# Patient Record
Sex: Male | Born: 1945
Health system: Southern US, Community
[De-identification: ages and names within clinical notes are randomized; demographics above are authoritative.]

## PROBLEM LIST (undated history)

## (undated) DIAGNOSIS — K219 Gastro-esophageal reflux disease without esophagitis: Secondary | ICD-10-CM

## (undated) DIAGNOSIS — K2270 Barrett's esophagus without dysplasia: Secondary | ICD-10-CM

## (undated) DIAGNOSIS — E7800 Pure hypercholesterolemia, unspecified: Secondary | ICD-10-CM

## (undated) DIAGNOSIS — M1990 Unspecified osteoarthritis, unspecified site: Secondary | ICD-10-CM

## (undated) HISTORY — PX: HERNIA REPAIR: SHX51

## (undated) HISTORY — PX: OTHER SURGICAL HISTORY: SHX169

## (undated) HISTORY — DX: Gastro-esophageal reflux disease without esophagitis: K21.9

## (undated) HISTORY — PX: CHOLECYSTECTOMY: SHX55

## (undated) HISTORY — PX: TONSILLECTOMY: SUR1361

## (undated) HISTORY — DX: Barrett's esophagus without dysplasia: K22.70

---

## 1998-11-27 HISTORY — PX: BACK SURGERY: SHX140

## 1999-04-11 ENCOUNTER — Encounter: Payer: Self-pay | Admitting: Neurosurgery

## 1999-04-11 ENCOUNTER — Inpatient Hospital Stay (HOSPITAL_COMMUNITY): Admission: RE | Admit: 1999-04-11 | Discharge: 1999-04-14 | Payer: Self-pay | Admitting: Neurosurgery

## 1999-11-01 ENCOUNTER — Encounter: Admission: RE | Admit: 1999-11-01 | Discharge: 1999-11-01 | Payer: Self-pay | Admitting: Neurosurgery

## 1999-11-01 ENCOUNTER — Encounter: Payer: Self-pay | Admitting: Neurosurgery

## 2000-05-01 ENCOUNTER — Encounter: Admission: RE | Admit: 2000-05-01 | Discharge: 2000-05-01 | Payer: Self-pay | Admitting: Neurosurgery

## 2000-05-01 ENCOUNTER — Encounter: Payer: Self-pay | Admitting: Neurosurgery

## 2001-04-19 ENCOUNTER — Ambulatory Visit (HOSPITAL_COMMUNITY): Admission: RE | Admit: 2001-04-19 | Discharge: 2001-04-19 | Payer: Self-pay | Admitting: Internal Medicine

## 2004-06-18 ENCOUNTER — Emergency Department (HOSPITAL_COMMUNITY): Admission: EM | Admit: 2004-06-18 | Discharge: 2004-06-18 | Payer: Self-pay | Admitting: Emergency Medicine

## 2004-06-24 ENCOUNTER — Inpatient Hospital Stay (HOSPITAL_COMMUNITY): Admission: AD | Admit: 2004-06-24 | Discharge: 2004-06-25 | Payer: Self-pay | Admitting: Internal Medicine

## 2006-03-28 ENCOUNTER — Encounter: Payer: Self-pay | Admitting: Neurosurgery

## 2008-07-07 ENCOUNTER — Ambulatory Visit (HOSPITAL_COMMUNITY): Admission: RE | Admit: 2008-07-07 | Discharge: 2008-07-07 | Payer: Self-pay | Admitting: Internal Medicine

## 2009-04-03 ENCOUNTER — Encounter: Admission: RE | Admit: 2009-04-03 | Discharge: 2009-04-03 | Payer: Self-pay | Admitting: Neurosurgery

## 2010-01-07 ENCOUNTER — Ambulatory Visit (HOSPITAL_COMMUNITY): Admission: RE | Admit: 2010-01-07 | Discharge: 2010-01-07 | Payer: Self-pay | Admitting: Internal Medicine

## 2011-04-14 NOTE — H&P (Signed)
NAME:  KNOWLEDGE, ESCANDON NO.:  [id number]   MEDICAL RECORD NO.:  [id number]                  PATIENT TYPE:   LOCATION:                                       FACILITY:   PHYSICIAN:  Kingsley Callander. Ouida Sills, M.D.                  DATE OF BIRTH:   DATE OF ADMISSION:  06/23/2004  DATE OF DISCHARGE:                                HISTORY & PHYSICAL   CHIEF COMPLAINT:  Upper abdominal pain.   HISTORY OF PRESENT ILLNESS:  This patient is a 65 year old white male who  was reevaluated today for upper abdominal pain.  He had been seen in the  emergency room last Saturday after he had experienced a 1-day bout of  vomiting, diarrhea and upper abdominal pain.  He had described epigastric  cramps.  He was evaluated in the office on 07/25 because of persistent  symptoms.  He had not experienced additional vomiting. He had been able to  eat Chicken Noodle soup.  He had been treated with Bentyl, clear liquids and  a bland diet.  He denied hematemesis, melena, or rectal bleeding.  His CBC  on July 23 revealed a white count of 9.3 with a hemoglobin of 14.5 and  platelets of 420.  His electrolytes had been normal, but his bilirubin had  been 1.7 with an SGOT of 21 and an SGPT of 26.  On evaluation in the office  today he was found to have increased tenderness in his epigastrium.  When he  attempted to eat he had had increased pain suggesting the possibility of  cholecystitis.  He did not experience radiation of pain to his back.  He has  not had prior abdominal surgeries.  He had undergone a colonoscopy in 2002.  He had had a hernia repair at 11.   PAST MEDICAL HISTORY:  1. Diabetes.  2. Hyperlipidemia.  3. Cornea transplants.  4. Hernia repair.  5. Tonsillectomy.  6. Microalbuminuria.  7. History of undescended right testis, previously evaluated by Dr.     Rito Ehrlich.  8. Arthroscopic left knee surgery.   MEDICATIONS:  1. Bentyl 10 mg t.i.d.  2. Glucophage 1000 mg b.i.d.  3. Amaryl 2 mg daily.  4. Lipitor 10 mg daily.  5. Aspirin 81 mg daily.  6. Mavik 1 mg daily.   ALLERGIES:  None.   SOCIAL HISTORY:  He does not use drugs or alcohol.  He is a former smoker.   FAMILY HISTORY:  His father died at 61 and had COPD and lung cancer.  His  mother died at 16 of a sudden death in her sleep.   REVIEW OF SYSTEMS:  Noncontributory.   PHYSICAL EXAMINATION:  VITAL SIGNS:  Weight 157, temperature 98, blood  pressure 104/76, pulse 72.  GENERAL:  Uncomfortable, possibly slightly jaundiced appearing white male.  HEENT:  Slight scleral icterus is present.  Pharynx is moist.  NECK:  Neck  is supple without adenopathy or thyromegaly.  LUNGS:  Clear.  HEART:  Regular with no murmurs.  ABDOMEN:  Tender in the upper abdomen, more centrally than right-sided.  No  palpable hepatosplenomegaly or mass.  EXTREMITIES:  No cyanosis, clubbing or edema.  NEUROLOGIC:  Grossly intact.   LABORATORY DATA:  Today's white count is 10.7, hemoglobin 15.4, platelets  458, 82 segs, 8 lymphs.  Chemistries are pending.  His ultrasound of the  gallbladder reveals a distended gallbladder with sludge. There is a small  amount of free fluid adjacent to the gallbladder fundus.  An acute abdominal  series is unremarkable.   IMPRESSION:  1. Cholecystitis.  Consult surgery.  2. Diabetes.  Hold Glucophage and Amaryl for now.  3. Hyperlipidemia.  4. Microalbuminuria, hold Mavik for now.     ___________________________________________                                         Kingsley Callander. Ouida Sills, M.D.   ROF/MEDQ  D:  06/23/2004  T:  06/23/2004  Job:  [id number]

## 2011-04-14 NOTE — Discharge Summary (Signed)
NAME:  Christian Woodard, Christian Woodard NO.:  [id number]   MEDICAL RECORD NO.:  [id number]                   PATIENT TYPE:  OBV   LOCATION:  A320                                 FACILITY:  [facility]   PHYSICIAN:  Dalia Heading, M.D.               DATE OF BIRTH:  Oct 30, 1946   DATE OF ADMISSION:  06/23/2004  DATE OF DISCHARGE:  06/25/2004                                 DISCHARGE SUMMARY   HOSPITAL COURSE:  The patient is a 65 year old white male who was admitted  by Dr. Ouida Sills for workup of right upper quadrant abdominal pain. He was noted  to have pericholecystic fluid with sludge. There was tenderness over the  gallbladder on ultrasound. His white blood cell count was noted to be  slightly elevated. He did have an elevated total bilirubin with indirect  elevated component. His direct bilirubin was within normal limits. Amylase  was within normal limits. Surgical consultation was obtained, and the  patient was taken the following day and underwent a laparoscopic  cholecystectomy. He was noted to have an inflamed gallbladder. He tolerated  the procedure well. His postoperative course has been unremarkable. His diet  was advanced without difficulty.   The patient is being discharged home on June 25, 2004 in good and improving  condition.   DISCHARGE INSTRUCTIONS:  The patient is to follow up with Dr. Franky Macho  on June 30, 2004.   DISCHARGE MEDICATIONS:  Vicodin one to two tablets p.o. q.4h. p.r.n. pain.  He is resume all of his medications as previously prescribed.   PRINCIPAL DIAGNOSES:  1. Cholecystitis, cholelithiasis.  2. Noninsulin-dependent diabetes mellitus.   PRINCIPAL PROCEDURE:  Laparoscopic cholecystectomy on June 24, 2004.     ___________________________________________                                         Dalia Heading, M.D.   MAJ/MEDQ  D:  06/25/2004  T:  06/25/2004  Job:  [id number]

## 2011-04-14 NOTE — Op Note (Signed)
NAME:  Christian Woodard, Christian Woodard NO.:  [id number]   MEDICAL RECORD NO.:  [id number]                   PATIENT TYPE:  OBV   LOCATION:  A320                                 FACILITY:  [facility]   PHYSICIAN:  Dalia Heading, M.D.               DATE OF BIRTH:  [birth year]   DATE OF PROCEDURE:  06/24/2004  DATE OF DISCHARGE:                                 OPERATIVE REPORT   PREOPERATIVE DIAGNOSIS:  Cholecystitis, cholelithiasis.   POSTOPERATIVE DIAGNOSIS:  Cholecystitis, cholelithiasis.   PROCEDURE:  Laparoscopic cholecystectomy.   SURGEON:  Dalia Heading, M.D.   ANESTHESIA:  General endotracheal.   INDICATIONS:  The patient is a 65 year old white male who presents with  cholecystitis secondary to cholelithiasis.  He also has a slightly elevated  white blood cell count and elevated total bilirubin, although this is all  indirect in nature.  Patient now comes to the operating room for  laparoscopic cholecystectomy with possible cholangiogram.  The risks and  benefits of the procedure, including bleeding, infection, hepatobiliary  injury, and the possibility of an open procedure were fully explained to the  patient, who gave informed consent.   PROCEDURE NOTE:  The patient was placed in the supine position.  After the  induction of general endotracheal anesthesia, the abdomen was prepped and  draped using the usual sterile technique with Betadine.  Surgical site  confirmation was performed.   A supraumbilical incision was made down to the fascia.  A Varies needle was  introduced into the abdominal cavity, and confirmation of placement was done  using the saline drop test.  The abdomen was then insufflated with 16 mmHg.  An 11 mm trocar was introduced into the abdominal cavity under direct  visualization without difficulty.  The patient was placed in the reverse  Trendelenburg position.  An additional 11 mm trocar was placed in the  epigastric region, and a 5 mm  trocar was placed in the right upper quadrant  and right flank regions.  The liver was inspected and noted to be within  normal limits.  The gallbladder was noted to be edematous and tense.  Needle  decompression of the gallbladder was performed.  Thick sludge was found.  The dissection was then begun around the infundibulum of the gallbladder.  The cystic duct was first identified.  The junction to the infundibulum was  fully identified.  The cystic duct was too narrowed to allow for  cholangiogram, thus, the cystic duct was ligated and divided using  Endoclips.  The cystic artery was likewise ligated and divided.  The  gallbladder was then freed away from the gallbladder fossa using Bovie  electrocautery.  The gallbladder was delivered through the epigastric trocar  site using an EndoCatch bag.  The gallbladder fossa was inspected.  No  abnormal bleeding or bile leakage was noted.  Surgicel was placed in the  gallbladder fossa.  All fluid and  air was then evacuated from the abdominal  cavity prior to removal of the trocars.   All wounds were irrigated with normal saline.  All wounds were injected with  0.5% Sensorcaine.  The supraumbilical fascia as well as the epigastric  fascia was reapproximated using an 0 Vicryl interrupted suture.  All skin  incisions were closed using staples.  Betadine ointment and dry sterile  dressings were applied.   All tape and needle counts were correct at the end of the procedure.  The  patient was extubated in the operating room and went back to the recovery  room awakened in stable condition.   COMPLICATIONS:  None.   SPECIMENS:  Gallbladder.   BLOOD LOSS:  Minimal.      ___________________________________________                                            Dalia Heading, M.D.   MAJ/MEDQ  D:  06/24/2004  T:  06/24/2004  Job:  [id number]   cc:   Kingsley Callander. Ouida Sills, M.D.  [address]  [place]  [place] [address]  Fax: [phone]

## 2011-08-07 ENCOUNTER — Telehealth: Payer: Self-pay

## 2011-08-07 NOTE — Telephone Encounter (Signed)
LMOM for pt to return call to be scheduled for colonoscopy.

## 2011-08-09 NOTE — Telephone Encounter (Signed)
LMOM to call. Mailing a letter also to call.  

## 2011-08-18 ENCOUNTER — Telehealth: Payer: Self-pay

## 2011-08-18 ENCOUNTER — Other Ambulatory Visit: Payer: Self-pay

## 2011-08-18 DIAGNOSIS — Z139 Encounter for screening, unspecified: Secondary | ICD-10-CM

## 2011-08-18 NOTE — Telephone Encounter (Signed)
Ok for colonoscopy Advised patient to take half of his metformin and Actos the day before the procedure Hold all diabetes medicines the day of the procedure and bring to the hospital Check blood sugars frequently, if any problems call his PCP or [place].

## 2011-08-18 NOTE — Telephone Encounter (Signed)
Gastroenterology Pre-Procedure Form  Request Date: 08/18/2011       Requesting Physician: Dr. Ouida Sills     PATIENT INFORMATION:  Christian Woodard is a 65 y.o., male (DOB=[birth year]).  PROCEDURE: Procedure(s) requested: colonoscopy Procedure Reason: screening for colon cancer  PATIENT REVIEW QUESTIONS: The patient reports the following:   1. Diabetes Melitis: yes  2. Joint replacements in the past 12 months: no 3. Major health problems in the past 3 months: no 4. Has an artificial valve or MVP:no 5. Has been advised in past to take antibiotics in advance of a procedure like teeth cleaning: no}    MEDICATIONS & ALLERGIES:    Patient reports the following regarding taking any blood thinners:   Plavix? no Aspirin?yes  Coumadin?  no  Patient confirms/reports the following medications:  Current Outpatient Prescriptions  Medication Sig Dispense Refill  . aspirin 81 MG tablet Take 81 mg by mouth daily.        Marland Kitchen atorvastatin (LIPITOR) 10 MG tablet Take 10 mg by mouth at bedtime.        Marland Kitchen glipiZIDE (GLUCOTROL) 5 MG tablet Take 5 mg by mouth 2 (two) times daily before a meal. In the AM       . losartan (COZAAR) 50 MG tablet Take 50 mg by mouth daily.        . metformin (FORTAMET) 1000 MG (OSM) 24 hr tablet Take 1,000 mg by mouth 2 (two) times daily with a meal.        . naproxen sodium (ANAPROX) 220 MG tablet Take 220 mg by mouth 2 (two) times daily with a meal. Takes 2 tablets daily for shoulder pain       . pioglitazone (ACTOS) 45 MG tablet Take 45 mg by mouth daily. In the AM         Patient confirms/reports the following allergies:  No Known Allergies  Patient is appropriate to schedule for requested procedure(s): yes  AUTHORIZATION INFORMATION Primary Insurance:   ID #:  Group #:  Pre-Cert / Auth required: Pre-Cert / Auth #:   Secondary Insurance:   ID #:   Group #:  Pre-Cert / Auth required:  Pre-Cert / Auth #:   No orders of the defined types were placed in this  encounter.    SCHEDULE INFORMATION: Procedure has been scheduled as follows:  Date: 09/15/2011       Time: 9:30 AM  Location: [facility] Short Stay  This Gastroenterology Pre-Precedure Form is being routed to the following provider(s) for review: R. Roetta Sessions, MD

## 2011-08-21 NOTE — Telephone Encounter (Signed)
Rx and instructions mailed.  

## 2011-09-14 MED ORDER — SODIUM CHLORIDE 0.45 % IV SOLN
Freq: Once | INTRAVENOUS | Status: AC
  Administered 2011-09-15: 09:00:00 via INTRAVENOUS

## 2011-09-15 ENCOUNTER — Ambulatory Visit (HOSPITAL_COMMUNITY)
Admission: RE | Admit: 2011-09-15 | Discharge: 2011-09-15 | Disposition: A | Discharge status: Home / Self Care | Payer: 59 | Source: Ambulatory Visit | Attending: Internal Medicine | Admitting: Internal Medicine

## 2011-09-15 ENCOUNTER — Encounter (HOSPITAL_COMMUNITY)
Admission: RE | Disposition: A | Discharge status: Home / Self Care | Payer: Self-pay | Source: Ambulatory Visit | Attending: Internal Medicine

## 2011-09-15 ENCOUNTER — Encounter (HOSPITAL_COMMUNITY): Payer: Self-pay | Admitting: *Deleted

## 2011-09-15 ENCOUNTER — Other Ambulatory Visit: Payer: Self-pay | Admitting: Internal Medicine

## 2011-09-15 DIAGNOSIS — D126 Benign neoplasm of colon, unspecified: Secondary | ICD-10-CM

## 2011-09-15 DIAGNOSIS — K5730 Diverticulosis of large intestine without perforation or abscess without bleeding: Secondary | ICD-10-CM | POA: Insufficient documentation

## 2011-09-15 DIAGNOSIS — Z139 Encounter for screening, unspecified: Secondary | ICD-10-CM

## 2011-09-15 DIAGNOSIS — Z7982 Long term (current) use of aspirin: Secondary | ICD-10-CM | POA: Insufficient documentation

## 2011-09-15 DIAGNOSIS — Z1211 Encounter for screening for malignant neoplasm of colon: Secondary | ICD-10-CM

## 2011-09-15 DIAGNOSIS — E7800 Pure hypercholesterolemia, unspecified: Secondary | ICD-10-CM | POA: Insufficient documentation

## 2011-09-15 DIAGNOSIS — E119 Type 2 diabetes mellitus without complications: Secondary | ICD-10-CM | POA: Insufficient documentation

## 2011-09-15 HISTORY — DX: Pure hypercholesterolemia, unspecified: E78.00

## 2011-09-15 HISTORY — DX: Unspecified osteoarthritis, unspecified site: M19.90

## 2011-09-15 HISTORY — PX: COLONOSCOPY: SHX5424

## 2011-09-15 LAB — GLUCOSE, CAPILLARY: Glucose-Capillary: 185 mg/dL — ABNORMAL HIGH (ref 70–99)

## 2011-09-15 SURGERY — COLONOSCOPY
Anesthesia: Moderate Sedation

## 2011-09-15 MED ORDER — MEPERIDINE HCL 100 MG/ML IJ SOLN
INTRAMUSCULAR | Status: AC
  Filled 2011-09-15: qty 2

## 2011-09-15 MED ORDER — MEPERIDINE HCL 100 MG/ML IJ SOLN
INTRAMUSCULAR | Status: DC | PRN
  Administered 2011-09-15: 50 mg via INTRAVENOUS

## 2011-09-15 MED ORDER — STERILE WATER FOR IRRIGATION IR SOLN
Status: DC | PRN
  Administered 2011-09-15: 09:00:00

## 2011-09-15 MED ORDER — MIDAZOLAM HCL 5 MG/5ML IJ SOLN
INTRAMUSCULAR | Status: AC
  Filled 2011-09-15: qty 10

## 2011-09-15 MED ORDER — MIDAZOLAM HCL 5 MG/5ML IJ SOLN
INTRAMUSCULAR | Status: DC | PRN
  Administered 2011-09-15: 2 mg via INTRAVENOUS
  Administered 2011-09-15: 1 mg via INTRAVENOUS

## 2011-09-15 NOTE — H&P (Addendum)
Primary Care Physician:  Carylon Perches, MD Primary Gastroenterologist:  Dr.   Pre-Procedure History & Physical: HPI:  Christian Woodard is a 65 y.o. male is here for a screening colonoscopy. Last average risk screening exam about 11 years ago. No family history of polyps or colon cancer. No GI symptoms currently.  Past Medical History  Diagnosis Date  . Hypercholesteremia   . Diabetes mellitus   . Arthritis     Past Surgical History  Procedure Date  . Right cornea transplant   . Back surgery 2000  . Cholecystectomy   . Hernia repair     as a child  . Tonsillectomy     Prior to Admission medications   Medication Sig Start Date End Date Taking? Authorizing Provider  aspirin 81 MG tablet Take 81 mg by mouth daily.     Yes Historical Provider, MD  atorvastatin (LIPITOR) 10 MG tablet Take 10 mg by mouth at bedtime.     Yes Historical Provider, MD  glipiZIDE (GLUCOTROL) 5 MG tablet Take 5 mg by mouth daily.    Yes Historical Provider, MD  losartan (COZAAR) 50 MG tablet Take 50 mg by mouth daily.     Yes Historical Provider, MD  metformin (FORTAMET) 1000 MG (OSM) 24 hr tablet Take 1,000 mg by mouth 2 (two) times daily with a meal.    Yes Historical Provider, MD  naproxen sodium (ALEVE) 220 MG tablet Take 220 mg by mouth 2 (two) times daily with a meal. Pain    Yes Historical Provider, MD  pioglitazone (ACTOS) 45 MG tablet Take 45 mg by mouth daily.    Yes Historical Provider, MD  naproxen sodium (ANAPROX) 220 MG tablet Take 220 mg by mouth 2 (two) times daily with a meal. Takes 2 tablets daily for shoulder pain     Historical Provider, MD    Allergies as of 08/18/2011  . (No Known Allergies)    History reviewed. No pertinent family history.  History   Social History  . Marital Status: Married    Spouse Name: N/A    Number of Children: N/A  . Years of Education: N/A   Occupational History  . Not on file.   Social History Main Topics  . Smoking status: Former Smoker -- 2.0  packs/day for 36 years  . Smokeless tobacco: Not on file  . Alcohol Use: No  . Drug Use: No  . Sexually Active:    Other Topics Concern  . Not on file   Social History Narrative  . No narrative on file    Review of Systems: See HPI, otherwise negative ROS  Physical Exam: BP 155/94  Pulse 96  Temp(Src) 97.7 F (36.5 C) (Oral)  Resp 20  Ht 5\' 6"  (1.676 m)  Wt 174 lb (78.926 kg)  BMI 28.08 kg/m2  SpO2 95% General:   Alert,  Well-developed, well-nourished, pleasant and cooperative in NAD Head:  Normocephalic and atraumatic. Eyes:  Sclera clear, no icterus.   Conjunctiva pink. Mouth:  No deformity or lesions, dentition normal. Neck:  Supple; no masses or thyromegaly. Lungs:  Clear throughout to auscultation.   No wheezes, crackles, or rhonchi. No acute distress. Heart:  Regular rate and rhythm; no murmurs, clicks, rubs,  or gallops. Abdomen:  Soft, nontender and nondistended. No masses, hepatosplenomegaly or hernias noted. Normal bowel sounds, without guarding, and without rebound.   Pulses:  Normal pulses noted. Extremities:  Without clubbing or edema.   Impression/Plan: Christian Woodard is now here to  undergo a screening colonoscopy.  Average risk examination  Risks, benefits, limitations, imponderables and alternatives regarding colonoscopy have been reviewed with the patient. Questions have been answered. All parties agreeable.

## 2011-09-22 ENCOUNTER — Encounter (HOSPITAL_COMMUNITY): Payer: Self-pay | Admitting: Internal Medicine

## 2013-02-01 ENCOUNTER — Encounter (HOSPITAL_COMMUNITY): Payer: Self-pay | Admitting: *Deleted

## 2013-02-01 ENCOUNTER — Emergency Department (HOSPITAL_COMMUNITY)
Admission: EM | Admit: 2013-02-01 | Discharge: 2013-02-01 | Disposition: A | Discharge status: Home / Self Care | Payer: Medicare Other | Attending: Emergency Medicine | Admitting: Emergency Medicine

## 2013-02-01 DIAGNOSIS — E119 Type 2 diabetes mellitus without complications: Secondary | ICD-10-CM | POA: Insufficient documentation

## 2013-02-01 DIAGNOSIS — M129 Arthropathy, unspecified: Secondary | ICD-10-CM | POA: Insufficient documentation

## 2013-02-01 DIAGNOSIS — M5416 Radiculopathy, lumbar region: Secondary | ICD-10-CM

## 2013-02-01 DIAGNOSIS — Z79899 Other long term (current) drug therapy: Secondary | ICD-10-CM | POA: Insufficient documentation

## 2013-02-01 DIAGNOSIS — Z9889 Other specified postprocedural states: Secondary | ICD-10-CM | POA: Insufficient documentation

## 2013-02-01 DIAGNOSIS — IMO0002 Reserved for concepts with insufficient information to code with codable children: Secondary | ICD-10-CM | POA: Insufficient documentation

## 2013-02-01 DIAGNOSIS — E7800 Pure hypercholesterolemia, unspecified: Secondary | ICD-10-CM | POA: Insufficient documentation

## 2013-02-01 DIAGNOSIS — Z87891 Personal history of nicotine dependence: Secondary | ICD-10-CM | POA: Insufficient documentation

## 2013-02-01 DIAGNOSIS — Z7982 Long term (current) use of aspirin: Secondary | ICD-10-CM | POA: Insufficient documentation

## 2013-02-01 MED ORDER — OXYCODONE-ACETAMINOPHEN 5-325 MG PO TABS: 1.0000 | ORAL_TABLET | Freq: Four times a day (QID) | ORAL | Status: DC | PRN

## 2013-02-01 MED ORDER — CYCLOBENZAPRINE HCL 10 MG PO TABS: 10.0000 mg | ORAL_TABLET | Freq: Three times a day (TID) | ORAL | Status: DC | PRN

## 2013-02-01 MED ORDER — OXYCODONE-ACETAMINOPHEN 5-325 MG PO TABS
1.0000 | ORAL_TABLET | Freq: Once | ORAL | Status: AC
  Administered 2013-02-01: 1 via ORAL
  Filled 2013-02-01: qty 1

## 2013-02-01 MED ORDER — CYCLOBENZAPRINE HCL 10 MG PO TABS
10.0000 mg | ORAL_TABLET | Freq: Once | ORAL | Status: AC
  Administered 2013-02-01: 10 mg via ORAL
  Filled 2013-02-01: qty 1

## 2013-02-01 NOTE — ED Notes (Signed)
Pt presents to er with c/o lower back pain that radiates down right leg. Has had problems with back before but pain was worse this am after shoveling snow yesterday

## 2013-02-01 NOTE — ED Notes (Signed)
Pt called stated they had lost his work note, 2nd note given left at registration desk for pt.

## 2013-02-01 NOTE — ED Notes (Signed)
Pt reports having low back pain since shoveling snow yesterday, has history of back problems.

## 2013-02-01 NOTE — ED Provider Notes (Signed)
History      CSN: [id number]  Arrival date & time 02/01/13  1122      Chief Complaint  Patient presents with  . Back Pain    Christian Woodard is a 67 y.o. male who presents to the Emergency Department complaining of  HPI Comments: Patient with history of lower back pain complains of worsening pain since yesterday after shoveling snow. Describes the pain as sharp, shooting pain that radiates into his right leg. The pain is worse with certain movements and standing and improves with rest. States the pain is similar to previous back pain.  He denies abdominal pain, incontinence of bladder or bowel, saddle anesthesias, or numbness or weakness to the lower extremities.  Patient is a 67 y.o. male presenting with back pain. The history is provided by the patient and medical records. No language interpreter was used.  Back Pain Location:  Lumbar spine Quality:  Aching and burning Radiates to:  R thigh Pain severity:  Moderate Onset quality:  Gradual Duration:  24 hours Timing:  Constant Progression:  Worsening Chronicity:  Recurrent Context: lifting heavy objects and twisting   Context: not falling   Relieved by:  Lying down Worsened by:  Ambulation, bending, twisting, standing and movement Ineffective treatments:  NSAIDs Associated symptoms: leg pain   Associated symptoms: no abdominal pain, no abdominal swelling, no bladder incontinence, no bowel incontinence, no chest pain, no dysuria, no fever, no headaches, no numbness, no paresthesias, no pelvic pain, no perianal numbness, no tingling and no weakness      Past Medical History  Diagnosis Date  . Hypercholesteremia   . Diabetes mellitus   . Arthritis     Past Surgical History  Procedure Laterality Date  . Right cornea transplant    . Back surgery  2000  . Cholecystectomy    . Hernia repair      as a child  . Tonsillectomy    . Colonoscopy  09/15/2011    Procedure: COLONOSCOPY;  Surgeon: Corbin Ade, MD;  Location: [facility]  ENDO [facility];  Service: Endoscopy;  Laterality: N/A;  9:30 am    No family history on file.  History  Substance Use Topics  . Smoking status: Former Smoker -- 2.00 packs/day for 36 years  . Smokeless tobacco: Not on file  . Alcohol Use: No      Review of Systems  Constitutional: Negative for fever.  Respiratory: Negative for shortness of breath.   Cardiovascular: Negative for chest pain.  Gastrointestinal: Negative for vomiting, abdominal pain, constipation and bowel incontinence.  Genitourinary: Negative for bladder incontinence, dysuria, hematuria, flank pain, decreased urine volume, difficulty urinating and pelvic pain.       No perineal numbness or incontinence of urine or feces  Musculoskeletal: Positive for back pain. Negative for joint swelling.  Skin: Negative for rash.  Neurological: Negative for tingling, weakness, numbness, headaches and paresthesias.  All other systems reviewed and are negative.    Allergies  Review of patient's allergies indicates no known allergies.  Home Medications   Current Outpatient Rx  Name  Route  Sig  Dispense  Refill  . aspirin 81 MG tablet   Oral   Take 81 mg by mouth daily.           Marland Kitchen atorvastatin (LIPITOR) 10 MG tablet   Oral   Take 10 mg by mouth at bedtime.           Marland Kitchen glipiZIDE (GLUCOTROL) 5 MG tablet   Oral  Take 5 mg by mouth daily.          Marland Kitchen losartan (COZAAR) 50 MG tablet   Oral   Take 50 mg by mouth daily.           . metformin (FORTAMET) 1000 MG (OSM) 24 hr tablet   Oral   Take 1,000 mg by mouth 2 (two) times daily with a meal.          . naproxen sodium (ALEVE) 220 MG tablet   Oral   Take 220 mg by mouth 2 (two) times daily with a meal. Pain          . pioglitazone (ACTOS) 45 MG tablet   Oral   Take 45 mg by mouth daily.            BP 112/61  Pulse 89  Temp(Src) 97.4 F (36.3 C)  Resp 18  SpO2 96%  Physical Exam  Nursing note and vitals reviewed. Constitutional: He is oriented  to person, place, and time. He appears well-developed and well-nourished. No distress.  HENT:  Head: Normocephalic and atraumatic.  Neck: Normal range of motion. Neck supple.  Cardiovascular: Normal rate, regular rhythm and intact distal pulses.   No murmur heard. Pulmonary/Chest: Effort normal and breath sounds normal.  Musculoskeletal: He exhibits tenderness. He exhibits no edema.       Lumbar back: He exhibits tenderness and pain. He exhibits normal range of motion, no swelling, no deformity, no laceration and normal pulse.       Back:  Tender to palpation of the right lumbar paraspinal muscle.  DP pulses are brisk and symmetrical, distal sensation intact. Pain is reproducible with straight-leg raise on the right.  Neurological: He is alert and oriented to person, place, and time. No sensory deficit. He exhibits normal muscle tone. Coordination and gait normal.  Reflex Scores:      Patellar reflexes are 2+ on the right side and 2+ on the left side.      Achilles reflexes are 2+ on the right side and 2+ on the left side. Skin: Skin is warm and dry.    ED Course  Procedures (including critical care time)  COORDINATION OF CARE: 1:07 PM Discussed ED treatment with pt and pt agrees.     Labs Reviewed - No data to display      MDM   Pt had MRI of L spine from 03/2009 that showed:  Foraminal extraforaminal disc protrusion at L3-4 contacts the  exiting and exited right L3 root.  2. Status post L4-5 fusion with good decompression of the thecal  sac and patent foramina.   Patient has ttp of the right lumbar paraspinal muscles.  No focal neuro deficits on exam.  Ambulates with a steady gait.   Doubt emergent neurological or infectious process.  Pt agrees to f/u with his PMD next week if the pain is not improving  Prescribed Flexeril Percocet #20    Nakkia Mackiewicz L. Trisha Mangle, PA-C 02/02/13 1735

## 2013-02-03 NOTE — ED Provider Notes (Signed)
Medical screening examination/treatment/procedure(s) were performed by non-physician practitioner and as supervising physician I was immediately available for consultation/collaboration.   Lyanne Co, MD 02/03/13 1556

## 2014-12-11 DIAGNOSIS — E119 Type 2 diabetes mellitus without complications: Secondary | ICD-10-CM | POA: Diagnosis not present

## 2014-12-28 DIAGNOSIS — E1129 Type 2 diabetes mellitus with other diabetic kidney complication: Secondary | ICD-10-CM | POA: Diagnosis not present

## 2015-01-01 DIAGNOSIS — Z0100 Encounter for examination of eyes and vision without abnormal findings: Secondary | ICD-10-CM | POA: Diagnosis not present

## 2015-01-29 DIAGNOSIS — E11319 Type 2 diabetes mellitus with unspecified diabetic retinopathy without macular edema: Secondary | ICD-10-CM | POA: Diagnosis not present

## 2015-02-01 DIAGNOSIS — Z947 Corneal transplant status: Secondary | ICD-10-CM | POA: Diagnosis not present

## 2015-02-01 DIAGNOSIS — T8130XA Disruption of wound, unspecified, initial encounter: Secondary | ICD-10-CM | POA: Diagnosis not present

## 2015-03-26 DIAGNOSIS — E119 Type 2 diabetes mellitus without complications: Secondary | ICD-10-CM | POA: Diagnosis not present

## 2015-04-13 DIAGNOSIS — E1129 Type 2 diabetes mellitus with other diabetic kidney complication: Secondary | ICD-10-CM | POA: Diagnosis not present

## 2015-07-16 DIAGNOSIS — E119 Type 2 diabetes mellitus without complications: Secondary | ICD-10-CM | POA: Diagnosis not present

## 2015-07-23 DIAGNOSIS — Z6829 Body mass index (BMI) 29.0-29.9, adult: Secondary | ICD-10-CM | POA: Diagnosis not present

## 2015-07-23 DIAGNOSIS — E1129 Type 2 diabetes mellitus with other diabetic kidney complication: Secondary | ICD-10-CM | POA: Diagnosis not present

## 2015-11-01 DIAGNOSIS — E119 Type 2 diabetes mellitus without complications: Secondary | ICD-10-CM | POA: Diagnosis not present

## 2015-11-01 DIAGNOSIS — Z125 Encounter for screening for malignant neoplasm of prostate: Secondary | ICD-10-CM | POA: Diagnosis not present

## 2015-11-01 DIAGNOSIS — Z79899 Other long term (current) drug therapy: Secondary | ICD-10-CM | POA: Diagnosis not present

## 2015-11-01 DIAGNOSIS — R809 Proteinuria, unspecified: Secondary | ICD-10-CM | POA: Diagnosis not present

## 2015-11-01 DIAGNOSIS — J449 Chronic obstructive pulmonary disease, unspecified: Secondary | ICD-10-CM | POA: Diagnosis not present

## 2015-11-01 DIAGNOSIS — E785 Hyperlipidemia, unspecified: Secondary | ICD-10-CM | POA: Diagnosis not present

## 2015-11-23 DIAGNOSIS — E1129 Type 2 diabetes mellitus with other diabetic kidney complication: Secondary | ICD-10-CM | POA: Diagnosis not present

## 2015-11-23 DIAGNOSIS — E785 Hyperlipidemia, unspecified: Secondary | ICD-10-CM | POA: Diagnosis not present

## 2015-11-23 DIAGNOSIS — Z23 Encounter for immunization: Secondary | ICD-10-CM | POA: Diagnosis not present

## 2016-02-23 DIAGNOSIS — H5210 Myopia, unspecified eye: Secondary | ICD-10-CM | POA: Diagnosis not present

## 2016-02-23 DIAGNOSIS — H524 Presbyopia: Secondary | ICD-10-CM | POA: Diagnosis not present

## 2016-03-02 DIAGNOSIS — T8130XA Disruption of wound, unspecified, initial encounter: Secondary | ICD-10-CM | POA: Diagnosis not present

## 2016-03-02 DIAGNOSIS — E119 Type 2 diabetes mellitus without complications: Secondary | ICD-10-CM | POA: Diagnosis not present

## 2016-03-09 DIAGNOSIS — H16412 Ghost vessels (corneal), left eye: Secondary | ICD-10-CM | POA: Diagnosis not present

## 2016-03-14 DIAGNOSIS — E119 Type 2 diabetes mellitus without complications: Secondary | ICD-10-CM | POA: Diagnosis not present

## 2016-03-21 DIAGNOSIS — E1129 Type 2 diabetes mellitus with other diabetic kidney complication: Secondary | ICD-10-CM | POA: Diagnosis not present

## 2016-03-21 DIAGNOSIS — Z6830 Body mass index (BMI) 30.0-30.9, adult: Secondary | ICD-10-CM | POA: Diagnosis not present

## 2016-07-17 DIAGNOSIS — E119 Type 2 diabetes mellitus without complications: Secondary | ICD-10-CM | POA: Diagnosis not present

## 2016-07-24 DIAGNOSIS — Z23 Encounter for immunization: Secondary | ICD-10-CM | POA: Diagnosis not present

## 2016-07-24 DIAGNOSIS — D179 Benign lipomatous neoplasm, unspecified: Secondary | ICD-10-CM | POA: Diagnosis not present

## 2016-07-24 DIAGNOSIS — E1129 Type 2 diabetes mellitus with other diabetic kidney complication: Secondary | ICD-10-CM | POA: Diagnosis not present

## 2016-07-26 ENCOUNTER — Encounter: Payer: Self-pay | Admitting: Internal Medicine

## 2016-09-04 ENCOUNTER — Other Ambulatory Visit: Payer: Self-pay

## 2016-09-04 ENCOUNTER — Ambulatory Visit (INDEPENDENT_AMBULATORY_CARE_PROVIDER_SITE_OTHER): Payer: Commercial Managed Care - HMO | Admitting: Gastroenterology

## 2016-09-04 ENCOUNTER — Encounter: Payer: Self-pay | Admitting: Gastroenterology

## 2016-09-04 DIAGNOSIS — Z860100 Personal history of colon polyps, unspecified: Secondary | ICD-10-CM

## 2016-09-04 DIAGNOSIS — R140 Abdominal distension (gaseous): Secondary | ICD-10-CM | POA: Diagnosis not present

## 2016-09-04 MED ORDER — PEG 3350-KCL-NA BICARB-NACL 420 G PO SOLR
4000.0000 mL | ORAL | 0 refills | Status: DC
Start: 2016-09-04 — End: 2017-01-23

## 2016-09-04 NOTE — Patient Instructions (Signed)
1. Colonoscopy as scheduled. Please see separate instructions. 2. You will take Linzess 217mcg daily starting four days before your procedure to ensure adequate bowel prep. This is in addition to your normal bowel prep. Samples provided today. 3. I will obtain a copy of your labs from PCP for further review to make sure there is no issues with anemia.

## 2016-09-04 NOTE — Assessment & Plan Note (Signed)
Due for 5 year surveillance colonoscopy. History of multiple adenomas in the past. Clinically he is doing well. He has some issues with abdominal bloating but it's mild. Patient states he's not really concerned. Believes is something that he is eating. Has noted that stools are less frequent but not hard. Has a bowel movement every other day or so. Prep was suboptimal on his last colonoscopy therefore we have sampled him Linzess 241mcg to take daily starting 4 days before his procedure. Colonoscopy in the near future.  I have discussed the risks, alternatives, benefits with regards to but not limited to the risk of reaction to medication, bleeding, infection, perforation and the patient is agreeable to proceed. Written consent to be obtained.

## 2016-09-04 NOTE — Progress Notes (Signed)
Primary Care Physician:  Asencion Noble, MD  Primary Gastroenterologist:  Garfield Cornea, MD   Chief Complaint  Patient presents with  . Colonoscopy    due 5 yrs, hx polyps    HPI:  Christian Woodard is a 70 y.o. male here for surveillance colonoscopy. Clinically he feels well. Sees Dr. Willey Blade 3-4 times per year for his diabetes. States his blood work is improved regarding his diabetes. Patient has a bowel movement every other day or so. Somewhat less frequent than before but denies constipation. Stools are soft and adequate. May have 2-3 bowel movements on the days that he does go. Denies blood in the stool or melena. Complains of intermittent abdominal bloating, wonders if there is something he is eating. No associated abdominal pain. No nausea or vomiting. Appetite is good. No heartburn or dysphagia. No unintentional weight loss.   Current Outpatient Prescriptions  Medication Sig Dispense Refill  . aspirin 81 MG tablet Take 81 mg by mouth daily.      Marland Kitchen atorvastatin (LIPITOR) 10 MG tablet Take 10 mg by mouth at bedtime.      Marland Kitchen glipiZIDE (GLUCOTROL) 5 MG tablet Take 5 mg by mouth daily.     Marland Kitchen LANTUS SOLOSTAR 100 UNIT/ML Solostar Pen Inject 14 Units into the skin daily. At bedtime    . losartan (COZAAR) 50 MG tablet Take 50 mg by mouth daily.      . metformin (FORTAMET) 1000 MG (OSM) 24 hr tablet Take 1,000 mg by mouth 2 (two) times daily with a meal.     . pioglitazone (ACTOS) 45 MG tablet Take 45 mg by mouth daily.      No current facility-administered medications for this visit.     Allergies as of 09/04/2016  . (No Known Allergies)    Past Medical History:  Diagnosis Date  . Arthritis   . Diabetes mellitus   . Hypercholesteremia     Past Surgical History:  Procedure Laterality Date  . BACK SURGERY  2000  . CHOLECYSTECTOMY    . COLONOSCOPY  09/15/2011   Dr. Gala Romney: Suboptimal prep. Pancolonic diverticulosis. Tubular adenomas. Next colonoscopy 5 years.  Marland Kitchen HERNIA REPAIR     as a  child  . Right cornea transplant    . TONSILLECTOMY      Family History  Problem Relation Age of Onset  . Colon cancer Neg Hx     Social History   Social History  . Marital status: Married    Spouse name: N/A  . Number of children: N/A  . Years of education: N/A   Occupational History  . Not on file.   Social History Main Topics  . Smoking status: Former Smoker    Packs/day: 2.00    Years: 36.00  . Smokeless tobacco: Never Used  . Alcohol use No  . Drug use: No  . Sexual activity: Not on file   Other Topics Concern  . Not on file   Social History Narrative  . No narrative on file      ROS:  General: Negative for anorexia, weight loss, fever, chills, fatigue, weakness. Eyes: Negative for vision changes.  ENT: Negative for hoarseness, difficulty swallowing , nasal congestion. CV: Negative for chest pain, angina, palpitations, dyspnea on exertion, peripheral edema.  Respiratory: Negative for dyspnea at rest, dyspnea on exertion, cough, sputum, wheezing.  GI: See history of present illness. GU:  Negative for dysuria, hematuria, urinary incontinence, urinary frequency, nocturnal urination.  MS: Negative for joint pain, low back  pain.  Derm: Negative for rash or itching.  Neuro: Negative for weakness, abnormal sensation, seizure, frequent headaches, memory loss, confusion.  Psych: Negative for anxiety, depression, suicidal ideation, hallucinations.  Endo: Negative for unusual weight change.  Heme: Negative for bruising or bleeding. Allergy: Negative for rash or hives.    Physical Examination:  BP 124/75   Pulse 86   Temp 97.7 F (36.5 C) (Oral)   Ht 5\' 6"  (1.676 m)   Wt 184 lb 3.2 oz (83.6 kg)   BMI 29.73 kg/m    General: Well-nourished, well-developed in no acute distress. Appears pale.  Head: Normocephalic, atraumatic.   Eyes: Conjunctivapale,  no icterus. Mouth: Oropharyngeal mucosa moist and pink , no lesions erythema or exudate. Neck: Supple  without thyromegaly, masses, or lymphadenopathy.  Lungs: Clear to auscultation bilaterally.  Heart: Regular rate and rhythm, no murmurs rubs or gallops.  Abdomen: Bowel sounds are normal, nontender, nondistended, no hepatosplenomegaly or masses, no abdominal bruits or    hernia , no rebound or guarding.   Rectal: not performed Extremities: No lower extremity edema. No clubbing or deformities.  Neuro: Alert and oriented x 4 , grossly normal neurologically.  Skin: Warm and dry, no rash or jaundice.   Psych: Alert and cooperative, normal mood and affect.

## 2016-09-04 NOTE — Progress Notes (Signed)
cc'ed to pcp °

## 2016-09-21 ENCOUNTER — Telehealth: Payer: Self-pay | Admitting: Internal Medicine

## 2016-09-21 NOTE — Telephone Encounter (Signed)
Pt is having colonoscopy tomorrow and his wife said the pharmacy hasn't received prep prescription. I told her that we have a confirmation from [facility] that they received it on 10/9, but I would let the nurse be aware so she can call pharmacy.

## 2016-09-21 NOTE — Telephone Encounter (Signed)
Called and spoke to pt's wife. Advised her to call [facility] and see if it was put back since it hadn't been picked up. Let us know if a new prescription needs to be sent to pharmacy.

## 2016-09-22 ENCOUNTER — Encounter (HOSPITAL_COMMUNITY): Payer: Self-pay

## 2016-09-22 ENCOUNTER — Ambulatory Visit (HOSPITAL_COMMUNITY)
Admission: RE | Admit: 2016-09-22 | Discharge: 2016-09-22 | Disposition: A | Discharge status: Home / Self Care | Payer: Commercial Managed Care - HMO | Source: Ambulatory Visit | Attending: Internal Medicine | Admitting: Internal Medicine

## 2016-09-22 ENCOUNTER — Encounter (HOSPITAL_COMMUNITY)
Admission: RE | Disposition: A | Discharge status: Home / Self Care | Payer: Self-pay | Source: Ambulatory Visit | Attending: Internal Medicine

## 2016-09-22 DIAGNOSIS — Z86010 Personal history of colonic polyps: Secondary | ICD-10-CM | POA: Diagnosis not present

## 2016-09-22 DIAGNOSIS — Z7982 Long term (current) use of aspirin: Secondary | ICD-10-CM | POA: Insufficient documentation

## 2016-09-22 DIAGNOSIS — Z87891 Personal history of nicotine dependence: Secondary | ICD-10-CM | POA: Diagnosis not present

## 2016-09-22 DIAGNOSIS — E7800 Pure hypercholesterolemia, unspecified: Secondary | ICD-10-CM | POA: Insufficient documentation

## 2016-09-22 DIAGNOSIS — Z1211 Encounter for screening for malignant neoplasm of colon: Secondary | ICD-10-CM | POA: Diagnosis not present

## 2016-09-22 DIAGNOSIS — Z79899 Other long term (current) drug therapy: Secondary | ICD-10-CM | POA: Insufficient documentation

## 2016-09-22 DIAGNOSIS — E119 Type 2 diabetes mellitus without complications: Secondary | ICD-10-CM | POA: Diagnosis not present

## 2016-09-22 DIAGNOSIS — M1990 Unspecified osteoarthritis, unspecified site: Secondary | ICD-10-CM | POA: Diagnosis not present

## 2016-09-22 DIAGNOSIS — Z794 Long term (current) use of insulin: Secondary | ICD-10-CM | POA: Diagnosis not present

## 2016-09-22 DIAGNOSIS — K5730 Diverticulosis of large intestine without perforation or abscess without bleeding: Secondary | ICD-10-CM | POA: Insufficient documentation

## 2016-09-22 HISTORY — PX: COLONOSCOPY: SHX5424

## 2016-09-22 LAB — GLUCOSE, CAPILLARY: GLUCOSE-CAPILLARY: 134 mg/dL — AB (ref 65–99)

## 2016-09-22 SURGERY — COLONOSCOPY
Anesthesia: Moderate Sedation

## 2016-09-22 MED ORDER — ONDANSETRON HCL 4 MG/2ML IJ SOLN
INTRAMUSCULAR | Status: DC | PRN
  Administered 2016-09-22: 4 mg via INTRAVENOUS

## 2016-09-22 MED ORDER — STERILE WATER FOR IRRIGATION IR SOLN
Status: DC | PRN
  Administered 2016-09-22: 11:00:00

## 2016-09-22 MED ORDER — MEPERIDINE HCL 100 MG/ML IJ SOLN
INTRAMUSCULAR | Status: AC
  Filled 2016-09-22: qty 2

## 2016-09-22 MED ORDER — MEPERIDINE HCL 100 MG/ML IJ SOLN
INTRAMUSCULAR | Status: DC | PRN
  Administered 2016-09-22: 50 mg

## 2016-09-22 MED ORDER — ONDANSETRON HCL 4 MG/2ML IJ SOLN
INTRAMUSCULAR | Status: AC
  Filled 2016-09-22: qty 2

## 2016-09-22 MED ORDER — MIDAZOLAM HCL 5 MG/5ML IJ SOLN
INTRAMUSCULAR | Status: DC | PRN
  Administered 2016-09-22: 2 mg via INTRAVENOUS

## 2016-09-22 MED ORDER — SODIUM CHLORIDE 0.9 % IV SOLN
INTRAVENOUS | Status: DC
  Administered 2016-09-22 (×2): via INTRAVENOUS

## 2016-09-22 MED ORDER — MIDAZOLAM HCL 5 MG/5ML IJ SOLN
INTRAMUSCULAR | Status: AC
  Filled 2016-09-22: qty 10

## 2016-09-22 NOTE — Discharge Instructions (Addendum)
Colonoscopy Discharge Instructions  Read the instructions outlined below and refer to this sheet in the next few weeks. These discharge instructions provide you with general information on caring for yourself after you leave the hospital. Your doctor may also give you specific instructions. While your treatment has been planned according to the most current medical practices available, unavoidable complications occasionally occur. If you have any problems or questions after discharge, call Dr. Gala Romney at [phone]. ACTIVITY  You may resume your regular activity, but move at a slower pace for the next 24 hours.   Take frequent rest periods for the next 24 hours.   Walking will help get rid of the air and reduce the bloated feeling in your belly (abdomen).   No driving for 24 hours (because of the medicine (anesthesia) used during the test).    Do not sign any important legal documents or operate any machinery for 24 hours (because of the anesthesia used during the test).  NUTRITION  Drink plenty of fluids.   You may resume your normal diet as instructed by your doctor.   Begin with a light meal and progress to your normal diet. Heavy or fried foods are harder to digest and may make you feel sick to your stomach (nauseated).   Avoid alcoholic beverages for 24 hours or as instructed.  MEDICATIONS  You may resume your normal medications unless your doctor tells you otherwise.  WHAT YOU CAN EXPECT TODAY  Some feelings of bloating in the abdomen.   Passage of more gas than usual.   Spotting of blood in your stool or on the toilet paper.  IF YOU HAD POLYPS REMOVED DURING THE COLONOSCOPY:  No aspirin products for 7 days or as instructed.   No alcohol for 7 days or as instructed.   Eat a soft diet for the next 24 hours.  FINDING OUT THE RESULTS OF YOUR TEST Not all test results are available during your visit. If your test results are not back during the visit, make an appointment  with your caregiver to find out the results. Do not assume everything is normal if you have not heard from your caregiver or the medical facility. It is important for you to follow up on all of your test results.  SEEK IMMEDIATE MEDICAL ATTENTION IF:  You have more than a spotting of blood in your stool.   Your belly is swollen (abdominal distention).   You are nauseated or vomiting.   You have a temperature over 101.   You have abdominal pain or discomfort that is severe or gets worse throughout the day.     Diverticulosis information provided  Return for 1 more colonoscopy in 5 years   Diverticulosis Diverticulosis is the condition that develops when small pouches (diverticula) form in the wall of your colon. Your colon, or large intestine, is where water is absorbed and stool is formed. The pouches form when the inside layer of your colon pushes through weak spots in the outer layers of your colon. CAUSES  No one knows exactly what causes diverticulosis. RISK FACTORS  Being older than 106. Your risk for this condition increases with age. Diverticulosis is rare in people younger than 40 years. By age 38, almost everyone has it.  Eating a low-fiber diet.  Being frequently constipated.  Being overweight.  Not getting enough exercise.  Smoking.  Taking over-the-counter pain medicines, like aspirin and ibuprofen. SYMPTOMS  Most people with diverticulosis do not have symptoms. DIAGNOSIS  Because diverticulosis  often has no symptoms, health care providers often discover the condition during an exam for other colon problems. In many cases, a health care provider will diagnose diverticulosis while using a flexible scope to examine the colon (colonoscopy). TREATMENT  If you have never developed an infection related to diverticulosis, you may not need treatment. If you have had an infection before, treatment may include:  Eating more fruits, vegetables, and grains.  Taking a  fiber supplement.  Taking a live bacteria supplement (probiotic).  Taking medicine to relax your colon. HOME CARE INSTRUCTIONS   Drink at least 6-8 glasses of water each day to prevent constipation.  Try not to strain when you have a bowel movement.  Keep all follow-up appointments. If you have had an infection before:  Increase the fiber in your diet as directed by your health care provider or dietitian.  Take a dietary fiber supplement if your health care provider approves.  Only take medicines as directed by your health care provider. SEEK MEDICAL CARE IF:   You have abdominal pain.  You have bloating.  You have cramps.  You have not gone to the bathroom in 3 days. SEEK IMMEDIATE MEDICAL CARE IF:   Your pain gets worse.  Yourbloating becomes very bad.  You have a fever or chills, and your symptoms suddenly get worse.  You begin vomiting.  You have bowel movements that are bloody or black. MAKE SURE YOU:  Understand these instructions.  Will watch your condition.  Will get help right away if you are not doing well or get worse.   This information is not intended to replace advice given to you by your health care provider. Make sure you discuss any questions you have with your health care provider.   Document Released: 08/10/2004 Document Revised: 11/18/2013 Document Reviewed: 10/08/2013 Elsevier Interactive Patient Education [employer].

## 2016-09-22 NOTE — H&P (View-Only) (Signed)
Primary Care Physician:  Asencion Noble, MD  Primary Gastroenterologist:  Garfield Cornea, MD   Chief Complaint  Patient presents with  . Colonoscopy    due 5 yrs, hx polyps    HPI:  Christian Woodard is a 70 y.o. male here for surveillance colonoscopy. Clinically he feels well. Sees Dr. Willey Blade 3-4 times per year for his diabetes. States his blood work is improved regarding his diabetes. Patient has a bowel movement every other day or so. Somewhat less frequent than before but denies constipation. Stools are soft and adequate. May have 2-3 bowel movements on the days that he does go. Denies blood in the stool or melena. Complains of intermittent abdominal bloating, wonders if there is something he is eating. No associated abdominal pain. No nausea or vomiting. Appetite is good. No heartburn or dysphagia. No unintentional weight loss.   Current Outpatient Prescriptions  Medication Sig Dispense Refill  . aspirin 81 MG tablet Take 81 mg by mouth daily.      Marland Kitchen atorvastatin (LIPITOR) 10 MG tablet Take 10 mg by mouth at bedtime.      Marland Kitchen glipiZIDE (GLUCOTROL) 5 MG tablet Take 5 mg by mouth daily.     Marland Kitchen LANTUS SOLOSTAR 100 UNIT/ML Solostar Pen Inject 14 Units into the skin daily. At bedtime    . losartan (COZAAR) 50 MG tablet Take 50 mg by mouth daily.      . metformin (FORTAMET) 1000 MG (OSM) 24 hr tablet Take 1,000 mg by mouth 2 (two) times daily with a meal.     . pioglitazone (ACTOS) 45 MG tablet Take 45 mg by mouth daily.      No current facility-administered medications for this visit.     Allergies as of 09/04/2016  . (No Known Allergies)    Past Medical History:  Diagnosis Date  . Arthritis   . Diabetes mellitus   . Hypercholesteremia     Past Surgical History:  Procedure Laterality Date  . BACK SURGERY  2000  . CHOLECYSTECTOMY    . COLONOSCOPY  09/15/2011   Dr. Gala Romney: Suboptimal prep. Pancolonic diverticulosis. Tubular adenomas. Next colonoscopy 5 years.  Marland Kitchen HERNIA REPAIR     as a  child  . Right cornea transplant    . TONSILLECTOMY      Family History  Problem Relation Age of Onset  . Colon cancer Neg Hx     Social History   Social History  . Marital status: Married    Spouse name: N/A  . Number of children: N/A  . Years of education: N/A   Occupational History  . Not on file.   Social History Main Topics  . Smoking status: Former Smoker    Packs/day: 2.00    Years: 36.00  . Smokeless tobacco: Never Used  . Alcohol use No  . Drug use: No  . Sexual activity: Not on file   Other Topics Concern  . Not on file   Social History Narrative  . No narrative on file      ROS:  General: Negative for anorexia, weight loss, fever, chills, fatigue, weakness. Eyes: Negative for vision changes.  ENT: Negative for hoarseness, difficulty swallowing , nasal congestion. CV: Negative for chest pain, angina, palpitations, dyspnea on exertion, peripheral edema.  Respiratory: Negative for dyspnea at rest, dyspnea on exertion, cough, sputum, wheezing.  GI: See history of present illness. GU:  Negative for dysuria, hematuria, urinary incontinence, urinary frequency, nocturnal urination.  MS: Negative for joint pain, low back  pain.  Derm: Negative for rash or itching.  Neuro: Negative for weakness, abnormal sensation, seizure, frequent headaches, memory loss, confusion.  Psych: Negative for anxiety, depression, suicidal ideation, hallucinations.  Endo: Negative for unusual weight change.  Heme: Negative for bruising or bleeding. Allergy: Negative for rash or hives.    Physical Examination:  BP 124/75   Pulse 86   Temp 97.7 F (36.5 C) (Oral)   Ht 5\' 6"  (1.676 m)   Wt 184 lb 3.2 oz (83.6 kg)   BMI 29.73 kg/m    General: Well-nourished, well-developed in no acute distress. Appears pale.  Head: Normocephalic, atraumatic.   Eyes: Conjunctivapale,  no icterus. Mouth: Oropharyngeal mucosa moist and pink , no lesions erythema or exudate. Neck: Supple  without thyromegaly, masses, or lymphadenopathy.  Lungs: Clear to auscultation bilaterally.  Heart: Regular rate and rhythm, no murmurs rubs or gallops.  Abdomen: Bowel sounds are normal, nontender, nondistended, no hepatosplenomegaly or masses, no abdominal bruits or    hernia , no rebound or guarding.   Rectal: not performed Extremities: No lower extremity edema. No clubbing or deformities.  Neuro: Alert and oriented x 4 , grossly normal neurologically.  Skin: Warm and dry, no rash or jaundice.   Psych: Alert and cooperative, normal mood and affect.

## 2016-09-22 NOTE — Op Note (Signed)
[facility] Patient Name: Christian Woodard Procedure Date: 09/22/2016 10:42 AM MRN: [id number] Date of Birth: May 08, 1946 Attending MD: Norvel Richards , MD CSN: [id number] Age: 70 Admit Type: Outpatient Procedure:                Colonoscopy Indications:              High risk colon cancer surveillance: Personal                            history of colonic polyps Providers:                Norvel Richards, MD, Renda Rolls, RN, Isabella Stalling, Technician Referring MD:              Medicines:                Midazolam 2 mg IV, Meperidine 50 mg IV Complications:            No immediate complications. Estimated Blood Loss:     Estimated blood loss: none. Procedure:                Pre-Anesthesia Assessment:                           - Prior to the procedure, a History and Physical                            was performed, and patient medications and                            allergies were reviewed. The patient's tolerance of                            previous anesthesia was also reviewed. The risks                            and benefits of the procedure and the sedation                            options and risks were discussed with the patient.                            All questions were answered, and informed consent                            was obtained. Prior Anticoagulants: The patient has                            taken no previous anticoagulant or antiplatelet                            agents. ASA Grade Assessment: III - A patient with  severe systemic disease. After reviewing the risks                            and benefits, the patient was deemed in                            satisfactory condition to undergo the procedure.                           After obtaining informed consent, the colonoscope                            was passed under direct vision. Throughout the   procedure, the patient's blood pressure, pulse, and                            oxygen saturations were monitored continuously. The                            EC-[id number] scope was introduced through                            the anus and advanced to the the cecum, identified                            by appendiceal orifice and ileocecal valve. The                            colonoscopy was performed without difficulty. The                            patient tolerated the procedure well. The quality                            of the bowel preparation was adequate. The                            ileocecal valve, appendiceal orifice, and rectum                            were photographed. The entire colon was well                            visualized. Scope In: 10:49:43 AM Scope Out: 11:08:22 AM Scope Withdrawal Time: 0 hours 7 minutes 33 seconds  Total Procedure Duration: 0 hours 18 minutes 39 seconds  Findings:      The perianal and digital rectal examinations were normal.      Scattered small and large-mouthed diverticula were found in the entire       colon.      The exam was otherwise without abnormality on direct and retroflexion       views. Impression:               - Diverticulosis in the entire examined colon.                           -  The examination was otherwise normal on direct                            and retroflexion views.                           - No specimens collected. Moderate Sedation:      Moderate (conscious) sedation was administered by the endoscopy nurse       and supervised by the endoscopist. The following parameters were       monitored: oxygen saturation, heart rate, blood pressure, respiratory       rate, EKG, adequacy of pulmonary ventilation, and response to care.       Total physician intraservice time was 36 minutes. Recommendation:           - Patient has a contact number available for                            emergencies. The  signs and symptoms of potential                            delayed complications were discussed with the                            patient. Return to normal activities tomorrow.                            Written discharge instructions were provided to the                            patient.                           - Resume previous diet.                           - Continue present medications.                           - Repeat colonoscopy in 5 years for surveillance.                           - Return to GI office PRN. Procedure Code(s):        --- Professional ---                           [phone], Colonoscopy, flexible; diagnostic, including                            collection of specimen(s) by brushing or washing,                            when performed (separate procedure)                           [id number], Moderate sedation services provided by the  same physician or other qualified health care                            professional performing the diagnostic or                            therapeutic service that the sedation supports,                            requiring the presence of an independent trained                            observer to assist in the monitoring of the                            patient's level of consciousness and physiological                            status; initial 15 minutes of intraservice time,                            patient age 12 years or older                           [phone], Moderate sedation services; each additional                            15 minutes intraservice time Diagnosis Code(s):        --- Professional ---                           Z86.010, Personal history of colonic polyps                           K57.30, Diverticulosis of large intestine without                            perforation or abscess without bleeding CPT copyright 2016 American Medical Association. All rights reserved. The codes  documented in this report are preliminary and upon coder review may  be revised to meet current compliance requirements. Cristopher Estimable. Rourk, MD Norvel Richards, MD 09/22/2016 11:17:11 AM This report has been signed electronically. Number of Addenda: 0

## 2016-09-22 NOTE — Interval H&P Note (Signed)
History and Physical Interval Note:  09/22/2016 10:42 AM  Christian Woodard  has presented today for surgery, with the diagnosis of HISTORY OF POLYPS  The various methods of treatment have been discussed with the patient and family. After consideration of risks, benefits and other options for treatment, the patient has consented to  Procedure(s) with comments: COLONOSCOPY (N/A) - 1100 as a surgical intervention .  The patient's history has been reviewed, patient examined, no change in status, stable for surgery.  I have reviewed the patient's chart and labs.  Questions were answered to the patient's satisfaction.     Christian Woodard  No change. Surveillance colonoscopy per plan. The risks, benefits, limitations, alternatives and imponderables have been reviewed with the patient. Questions have been answered. All parties are agreeable.

## 2016-09-26 ENCOUNTER — Encounter (HOSPITAL_COMMUNITY): Payer: Self-pay | Admitting: Internal Medicine

## 2016-11-24 DIAGNOSIS — J449 Chronic obstructive pulmonary disease, unspecified: Secondary | ICD-10-CM | POA: Diagnosis not present

## 2016-11-24 DIAGNOSIS — E119 Type 2 diabetes mellitus without complications: Secondary | ICD-10-CM | POA: Diagnosis not present

## 2016-11-24 DIAGNOSIS — Z79899 Other long term (current) drug therapy: Secondary | ICD-10-CM | POA: Diagnosis not present

## 2016-11-24 DIAGNOSIS — E785 Hyperlipidemia, unspecified: Secondary | ICD-10-CM | POA: Diagnosis not present

## 2016-12-01 DIAGNOSIS — E1129 Type 2 diabetes mellitus with other diabetic kidney complication: Secondary | ICD-10-CM | POA: Diagnosis not present

## 2016-12-01 DIAGNOSIS — Z6830 Body mass index (BMI) 30.0-30.9, adult: Secondary | ICD-10-CM | POA: Diagnosis not present

## 2016-12-01 DIAGNOSIS — E785 Hyperlipidemia, unspecified: Secondary | ICD-10-CM | POA: Diagnosis not present

## 2016-12-01 DIAGNOSIS — Z23 Encounter for immunization: Secondary | ICD-10-CM | POA: Diagnosis not present

## 2016-12-15 NOTE — Progress Notes (Signed)
Reviewed labs received but they wree from 10/2015 LFTs normal. CBC normal.

## 2016-12-26 ENCOUNTER — Encounter: Payer: Self-pay | Admitting: Internal Medicine

## 2016-12-27 ENCOUNTER — Encounter: Payer: Self-pay | Admitting: Internal Medicine

## 2017-01-23 ENCOUNTER — Emergency Department (HOSPITAL_COMMUNITY): Payer: Medicare HMO

## 2017-01-23 ENCOUNTER — Encounter (HOSPITAL_COMMUNITY): Payer: Self-pay | Admitting: Emergency Medicine

## 2017-01-23 ENCOUNTER — Emergency Department (HOSPITAL_COMMUNITY)
Admission: EM | Admit: 2017-01-23 | Discharge: 2017-01-23 | Disposition: A | Discharge status: Home / Self Care | Payer: Medicare HMO | Attending: Emergency Medicine | Admitting: Emergency Medicine

## 2017-01-23 DIAGNOSIS — Z79899 Other long term (current) drug therapy: Secondary | ICD-10-CM | POA: Diagnosis not present

## 2017-01-23 DIAGNOSIS — M25522 Pain in left elbow: Secondary | ICD-10-CM | POA: Diagnosis not present

## 2017-01-23 DIAGNOSIS — W010XXA Fall on same level from slipping, tripping and stumbling without subsequent striking against object, initial encounter: Secondary | ICD-10-CM | POA: Diagnosis not present

## 2017-01-23 DIAGNOSIS — Y999 Unspecified external cause status: Secondary | ICD-10-CM | POA: Diagnosis not present

## 2017-01-23 DIAGNOSIS — Z7984 Long term (current) use of oral hypoglycemic drugs: Secondary | ICD-10-CM | POA: Diagnosis not present

## 2017-01-23 DIAGNOSIS — S5002XA Contusion of left elbow, initial encounter: Secondary | ICD-10-CM | POA: Diagnosis not present

## 2017-01-23 DIAGNOSIS — E119 Type 2 diabetes mellitus without complications: Secondary | ICD-10-CM | POA: Insufficient documentation

## 2017-01-23 DIAGNOSIS — S51032A Puncture wound without foreign body of left elbow, initial encounter: Secondary | ICD-10-CM | POA: Insufficient documentation

## 2017-01-23 DIAGNOSIS — Z23 Encounter for immunization: Secondary | ICD-10-CM | POA: Insufficient documentation

## 2017-01-23 DIAGNOSIS — Z7982 Long term (current) use of aspirin: Secondary | ICD-10-CM | POA: Insufficient documentation

## 2017-01-23 DIAGNOSIS — Z87891 Personal history of nicotine dependence: Secondary | ICD-10-CM | POA: Insufficient documentation

## 2017-01-23 DIAGNOSIS — S59902A Unspecified injury of left elbow, initial encounter: Secondary | ICD-10-CM | POA: Diagnosis present

## 2017-01-23 DIAGNOSIS — Y939 Activity, unspecified: Secondary | ICD-10-CM | POA: Diagnosis not present

## 2017-01-23 DIAGNOSIS — Y929 Unspecified place or not applicable: Secondary | ICD-10-CM | POA: Diagnosis not present

## 2017-01-23 MED ORDER — TETANUS-DIPHTH-ACELL PERTUSSIS 5-2.5-18.5 LF-MCG/0.5 IM SUSP
0.5000 mL | Freq: Once | INTRAMUSCULAR | Status: AC
  Administered 2017-01-23: 0.5 mL via INTRAMUSCULAR
  Filled 2017-01-23: qty 0.5

## 2017-01-23 NOTE — ED Triage Notes (Signed)
Patient states he fell this evening landing on his left elbow. Patient has significant swelling noted to left elbow at triage. Denies any other injury.

## 2017-01-23 NOTE — ED Provider Notes (Signed)
[facility] DEPT Provider Note   CSN: [id number] Arrival date & time: 01/23/17  2109   By signing my name below, I, Christian Woodard, attest that this documentation has been prepared under the direction and in the presence of Milton Ferguson, MD. Electronically Signed: Hilbert Woodard, Scribe. 01/23/17. 10:22 PM. History   Chief Complaint Chief Complaint  Patient presents with  . Elbow Injury   HPI Comments: Christian Woodard is a 71 y.o. male who presents to the Emergency Department complaining of left elbow pain with associated bleeding s/p fall that occurred around 8:30 pm tonight. The patient states that he slipped and fell on his left elbow at that time. He has applied pressure to the wound to attempt and control the bleeding. He denies any other injuries. The patient is currently on ASA.   The history is provided by the patient. No language interpreter was used.  Arm Injury   This is a new problem. The problem occurs constantly. The problem has been gradually worsening. The pain is present in the left elbow. The pain is at a severity of 2/10. The pain is mild.    Past Medical History:  Diagnosis Date  . Arthritis   . Diabetes mellitus   . Hypercholesteremia     Patient Active Problem List   Diagnosis Date Noted  . Abdominal bloating 09/04/2016  . Hx of adenomatous colonic polyps 09/04/2016    Past Surgical History:  Procedure Laterality Date  . BACK SURGERY  2000  . CHOLECYSTECTOMY    . COLONOSCOPY  09/15/2011   Dr. Gala Romney: Suboptimal prep. Pancolonic diverticulosis. Tubular adenomas. Next colonoscopy 5 years.  . COLONOSCOPY N/A 09/22/2016   Procedure: COLONOSCOPY;  Surgeon: Daneil Dolin, MD;  Location: [facility];  Service: Endoscopy;  Laterality: N/A;  1100  . HERNIA REPAIR     as a child  . Right cornea transplant    . TONSILLECTOMY         Home Medications    Prior to Admission medications   Medication Sig Start Date End Date Taking? Authorizing  Provider  acetaminophen (TYLENOL) 500 MG tablet Take 1,000 mg by mouth every 6 (six) hours as needed for moderate pain or headache.   Yes Historical Provider, MD  aspirin 81 MG tablet Take 81 mg by mouth daily.     Yes Historical Provider, MD  atorvastatin (LIPITOR) 10 MG tablet Take 10 mg by mouth at bedtime.     Yes Historical Provider, MD  glipiZIDE (GLUCOTROL XL) 5 MG 24 hr tablet Take 5 mg by mouth daily with breakfast.   Yes Historical Provider, MD  LANTUS SOLOSTAR 100 UNIT/ML Solostar Pen Inject 14 Units into the skin daily. At bedtime 08/23/16  Yes Historical Provider, MD  losartan (COZAAR) 50 MG tablet Take 50 mg by mouth daily.     Yes Historical Provider, MD  metFORMIN (GLUCOPHAGE) 1000 MG tablet Take 1,000 mg by mouth 2 (two) times daily. 07/22/16  Yes Historical Provider, MD  pioglitazone (ACTOS) 45 MG tablet Take 45 mg by mouth daily.    Yes Historical Provider, MD    Family History Family History  Problem Relation Age of Onset  . Colon cancer Neg Hx     Social History Social History  Substance Use Topics  . Smoking status: Former Smoker    Packs/day: 2.00    Years: 36.00  . Smokeless tobacco: Never Used  . Alcohol use No     Allergies   Patient has no known  allergies.   Review of Systems Review of Systems  Constitutional: Negative for appetite change and fatigue.  HENT: Negative for congestion, ear discharge and sinus pressure.   Eyes: Negative for discharge.  Respiratory: Negative for cough.   Cardiovascular: Negative for chest pain.  Gastrointestinal: Negative for abdominal pain and diarrhea.  Genitourinary: Negative for frequency and hematuria.  Musculoskeletal: Negative for back pain.  Skin: Positive for wound (Left Elbow). Negative for rash.  Neurological: Negative for seizures and headaches.  Psychiatric/Behavioral: Negative for hallucinations.  All other systems reviewed and are negative.    Physical Exam Updated Vital Signs BP 153/67 (BP  Location: Right Arm)   Pulse 101   Temp 97.8 F (36.6 C) (Oral)   Resp 20   Ht 5\' 6"  (1.676 m)   Wt 182 lb (82.6 kg)   SpO2 95%   BMI 29.38 kg/m   Physical Exam  Constitutional: He is oriented to person, place, and time. He appears well-developed.  HENT:  Head: Normocephalic.  Eyes: Conjunctivae are normal.  Neck: No tracheal deviation present.  Cardiovascular:  No murmur heard. Musculoskeletal: Normal range of motion.  Neurological: He is oriented to person, place, and time.  Skin: Skin is warm.  Large hematoma on his left elbow with a small puncture.  Psychiatric: He has a normal mood and affect.  Nursing note and vitals reviewed.    ED Treatments / Results  DIAGNOSTIC STUDIES: Oxygen Saturation is 95% on RA, adequate by my interpretation.    COORDINATION OF CARE: 10:18 PM Discussed treatment plan with pt at bedside and pt agreed to plan. I will check the patient's X-ray.  Labs (all labs ordered are listed, but only abnormal results are displayed) Labs Reviewed - No data to display  EKG  EKG Interpretation None       Radiology Dg Elbow Complete Left  Result Date: 01/23/2017 CLINICAL DATA:  Left elbow pain after injury, fall this evening landing on left elbow. EXAM: LEFT ELBOW - COMPLETE 3+ VIEW COMPARISON:  None. FINDINGS: Large focal soft tissue prominence and edema overlying the olecranon bursa adjacent to an olecranon spur. No evidence of acute fracture. Elbow alignment is maintained. There is no elbow joint effusion. IMPRESSION: 1. No acute fracture or subluxation. 2. Large focal soft tissue prominence and edema overlying the olecranon bursa likely secondary to hematoma in the setting of injury. Electronically Signed   By: Jeb Levering M.D.   On: 01/23/2017 21:59    Procedures Procedures (including critical care time)  Medications Ordered in ED Medications  Tdap (BOOSTRIX) injection 0.5 mL (not administered)     Initial Impression / Assessment  and Plan / ED Course  I have reviewed the triage vital signs and the nursing notes.  Pertinent labs & imaging results that were available during my care of the patient were reviewed by me and considered in my medical decision making (see chart for details).     Hematoma to left elbow.  Pt had pressure dressing over small puncture wound.  Pt to follow up with ortho  Final Clinical Impressions(s) / ED Diagnoses   Final diagnoses:  None    New Prescriptions New Prescriptions   No medications on file   The chart was scribed for me under my direct supervision.  I personally performed the history, physical, and medical decision making and all procedures in the evaluation of this patient.Milton Ferguson, MD 01/23/17 [phone]

## 2017-01-23 NOTE — Discharge Instructions (Signed)
Follow up with Dr. Aline Brochure in 2 days.  Call tomorrow to make an appointment

## 2017-01-24 ENCOUNTER — Ambulatory Visit (INDEPENDENT_AMBULATORY_CARE_PROVIDER_SITE_OTHER): Payer: Medicare HMO | Admitting: Orthopaedic Surgery

## 2017-01-24 ENCOUNTER — Encounter: Payer: Self-pay | Admitting: Orthopaedic Surgery

## 2017-01-24 VITALS — BP 129/67 | HR 91 | Temp 97.3°F | Ht 66.0 in | Wt 184.0 lb

## 2017-01-24 DIAGNOSIS — M7022 Olecranon bursitis, left elbow: Secondary | ICD-10-CM

## 2017-01-24 NOTE — Progress Notes (Signed)
Subjective:    Patient ID: Christian Woodard, male    DOB: [birth year], 71 y.o.   MRN: [id number]  HPI Christian Woodard fell last night and hurt his left elbow.  Christian Woodard has swelling of the olecranon area and abrasions of the elbow and the forearm on the left.  Christian Woodard was seen in the ER last night.  I have reviewed the ER records and the x-rays and the x-ray reports.  Christian Woodard has no other injury.   Review of Systems  HENT: Negative for congestion.   Respiratory: Negative for cough and shortness of breath.   Cardiovascular: Negative for chest pain and leg swelling.  Endocrine: Negative for cold intolerance.  Musculoskeletal: Positive for arthralgias.  Allergic/Immunologic: Negative for environmental allergies.   Past Medical History:  Diagnosis Date  . Arthritis   . Diabetes mellitus   . Hypercholesteremia     Past Surgical History:  Procedure Laterality Date  . BACK SURGERY  2000  . CHOLECYSTECTOMY    . COLONOSCOPY  09/15/2011   Dr. Gala Romney: Suboptimal prep. Pancolonic diverticulosis. Tubular adenomas. Next colonoscopy 5 years.  . COLONOSCOPY N/A 09/22/2016   Procedure: COLONOSCOPY;  Surgeon: Daneil Dolin, MD;  Location: [facility];  Service: Endoscopy;  Laterality: N/A;  1100  . HERNIA REPAIR     as a child  . Right cornea transplant    . TONSILLECTOMY      Current Outpatient Prescriptions on File Prior to Visit  Medication Sig Dispense Refill  . acetaminophen (TYLENOL) 500 MG tablet Take 1,000 mg by mouth every 6 (six) hours as needed for moderate pain or headache.    Marland Kitchen aspirin 81 MG tablet Take 81 mg by mouth daily.      Marland Kitchen atorvastatin (LIPITOR) 10 MG tablet Take 10 mg by mouth at bedtime.      Marland Kitchen glipiZIDE (GLUCOTROL XL) 5 MG 24 hr tablet Take 5 mg by mouth daily with breakfast.    . LANTUS SOLOSTAR 100 UNIT/ML Solostar Pen Inject 14 Units into the skin daily. At bedtime    . losartan (COZAAR) 50 MG tablet Take 50 mg by mouth daily.      . metFORMIN (GLUCOPHAGE) 1000 MG tablet Take 1,000 mg  by mouth 2 (two) times daily.    . pioglitazone (ACTOS) 45 MG tablet Take 45 mg by mouth daily.      No current facility-administered medications on file prior to visit.     Social History   Social History  . Marital status: Married    Spouse name: N/A  . Number of children: N/A  . Years of education: N/A   Occupational History  . Not on file.   Social History Main Topics  . Smoking status: Former Smoker    Packs/day: 2.00    Years: 36.00  . Smokeless tobacco: Never Used  . Alcohol use No  . Drug use: No  . Sexual activity: Not on file   Other Topics Concern  . Not on file   Social History Narrative  . No narrative on file    Family History  Problem Relation Age of Onset  . Heart disease Mother   . Heart disease Father   . Colon cancer Neg Hx     BP 129/67   Pulse 91   Temp 97.3 F (36.3 C)   Ht 5\' 6"  (1.676 m)   Wt 184 lb (83.5 kg)   BMI 29.70 kg/m      Objective:   Physical Exam  Constitutional: Christian Woodard is oriented to person, place, and time. Christian Woodard appears well-developed and well-nourished.  HENT:  Head: Normocephalic and atraumatic.  Eyes: Conjunctivae and EOM are normal. Pupils are equal, round, and reactive to light.  Neck: Normal range of motion. Neck supple.  Cardiovascular: Normal rate, regular rhythm and intact distal pulses.   Pulmonary/Chest: Effort normal.  Abdominal: Soft.  Musculoskeletal: Christian Woodard exhibits tenderness (Christian Woodard has swelling and very recent abrasion of the olecranon bursa area.  Christian Woodard has contusions of the forearm on the left.  NV intact.  ROM is full. ).  Neurological: Christian Woodard is alert and oriented to person, place, and time. Christian Woodard has normal reflexes. Christian Woodard displays normal reflexes. No cranial nerve deficit. Christian Woodard exhibits normal muscle tone. Coordination normal.  Skin: Skin is warm and dry.  Psychiatric: Christian Woodard has a normal mood and affect. His behavior is normal. Judgment and thought content normal.  Vitals reviewed.         Assessment & Plan:    Encounter Diagnosis  Name Primary?  Marland Kitchen Olecranon bursitis of left elbow Yes   I attempted aspiration of the bursa of the left olecranon.  Bloody fluid removed.  A new sterile dressing applied to the left elbow.  Care of elbow explained and extra tape and gauze given.  Return in ten days.  Call if any problem  Precautions discussed.  Electronically Signed Sanjuana Kava, MD 2/28/20184:47 PM

## 2017-02-06 ENCOUNTER — Encounter: Payer: Self-pay | Admitting: Orthopaedic Surgery

## 2017-02-06 ENCOUNTER — Ambulatory Visit (INDEPENDENT_AMBULATORY_CARE_PROVIDER_SITE_OTHER): Payer: Medicare HMO | Admitting: Orthopaedic Surgery

## 2017-02-06 VITALS — BP 124/71 | HR 84 | Temp 97.3°F | Ht 66.0 in | Wt 185.0 lb

## 2017-02-06 DIAGNOSIS — M7022 Olecranon bursitis, left elbow: Secondary | ICD-10-CM

## 2017-02-06 NOTE — Progress Notes (Signed)
Patient Christian Woodard, male DOB:[birth year], 71 y.o. [id number]  Chief Complaint  Patient presents with  . Elbow Pain    left     HPI  Christian Woodard is a 71 y.o. male who has left elbow bursitis swelling.  He is better.  He has less swelling and less pain.  He has no redness. HPI  Body mass index is 29.86 kg/m.  ROS  Review of Systems  HENT: Negative for congestion.   Respiratory: Negative for cough and shortness of breath.   Cardiovascular: Negative for chest pain and leg swelling.  Endocrine: Negative for cold intolerance.  Musculoskeletal: Positive for arthralgias.  Allergic/Immunologic: Negative for environmental allergies.    Past Medical History:  Diagnosis Date  . Arthritis   . Diabetes mellitus   . Hypercholesteremia     Past Surgical History:  Procedure Laterality Date  . BACK SURGERY  2000  . CHOLECYSTECTOMY    . COLONOSCOPY  09/15/2011   Dr. Gala Romney: Suboptimal prep. Pancolonic diverticulosis. Tubular adenomas. Next colonoscopy 5 years.  . COLONOSCOPY N/A 09/22/2016   Procedure: COLONOSCOPY;  Surgeon: Daneil Dolin, MD;  Location: [facility];  Service: Endoscopy;  Laterality: N/A;  1100  . HERNIA REPAIR     as a child  . Right cornea transplant    . TONSILLECTOMY      Family History  Problem Relation Age of Onset  . Heart disease Mother   . Heart disease Father   . Colon cancer Neg Hx     Social History Social History  Substance Use Topics  . Smoking status: Former Smoker    Packs/day: 2.00    Years: 36.00  . Smokeless tobacco: Never Used  . Alcohol use No    No Known Allergies  Current Outpatient Prescriptions  Medication Sig Dispense Refill  . acetaminophen (TYLENOL) 500 MG tablet Take 1,000 mg by mouth every 6 (six) hours as needed for moderate pain or headache.    Marland Kitchen aspirin 81 MG tablet Take 81 mg by mouth daily.      Marland Kitchen atorvastatin (LIPITOR) 10 MG tablet Take 10 mg by mouth at bedtime.      Marland Kitchen glipiZIDE (GLUCOTROL  XL) 5 MG 24 hr tablet Take 5 mg by mouth daily with breakfast.    . LANTUS SOLOSTAR 100 UNIT/ML Solostar Pen Inject 14 Units into the skin daily. At bedtime    . losartan (COZAAR) 50 MG tablet Take 50 mg by mouth daily.      . metFORMIN (GLUCOPHAGE) 1000 MG tablet Take 1,000 mg by mouth 2 (two) times daily.    . pioglitazone (ACTOS) 45 MG tablet Take 45 mg by mouth daily.      No current facility-administered medications for this visit.      Physical Exam  Blood pressure 124/71, pulse 84, temperature 97.3 F (36.3 C), height 5\' 6"  (1.676 m), weight 185 lb (83.9 kg).  Constitutional: overall normal hygiene, normal nutrition, well developed, normal grooming, normal body habitus. Assistive device:none  Musculoskeletal: gait and station Limp none, muscle tone and strength are normal, no tremors or atrophy is present.  .  Neurological: coordination overall normal.  Deep tendon reflex/nerve stretch intact.  Sensation normal.  Cranial nerves II-XII intact.   Skin:   Normal overall no scars, lesions, ulcers or rashes. No psoriasis.  Psychiatric: Alert and oriented x 3.  Recent memory intact, remote memory unclear.  Normal mood and affect. Well groomed.  Good eye contact.  Cardiovascular: overall no  swelling, no varicosities, no edema bilaterally, normal temperatures of the legs and arms, no clubbing, cyanosis and good capillary refill.  Lymphatic: palpation is normal.  Left elbow has full motion.  There is a small olecranon bursa swelling with no redness. NV is intact.  The patient has been educated about the nature of the problem(s) and counseled on treatment options.  The patient appeared to understand what I have discussed and is in agreement with it.  Encounter Diagnosis  Name Primary?  Marland Kitchen Olecranon bursitis of left elbow Yes    PLAN Call if any problems.  Precautions discussed.  Continue current medications.   Return to clinic 2 weeks Observe the elbow for now.     Electronically Signed Sanjuana Kava, MD 3/13/20184:13 PM

## 2017-02-20 ENCOUNTER — Ambulatory Visit: Payer: Medicare HMO | Admitting: Orthopaedic Surgery

## 2017-03-26 DIAGNOSIS — E119 Type 2 diabetes mellitus without complications: Secondary | ICD-10-CM | POA: Diagnosis not present

## 2017-04-03 ENCOUNTER — Ambulatory Visit (HOSPITAL_COMMUNITY)
Admission: RE | Admit: 2017-04-03 | Discharge: 2017-04-03 | Disposition: A | Discharge status: Home / Self Care | Payer: Medicare HMO | Source: Ambulatory Visit | Attending: Internal Medicine | Admitting: Internal Medicine

## 2017-04-03 ENCOUNTER — Other Ambulatory Visit (HOSPITAL_COMMUNITY): Payer: Self-pay | Admitting: Internal Medicine

## 2017-04-03 DIAGNOSIS — I700 Atherosclerosis of aorta: Secondary | ICD-10-CM | POA: Diagnosis not present

## 2017-04-03 DIAGNOSIS — R0602 Shortness of breath: Secondary | ICD-10-CM

## 2017-04-03 DIAGNOSIS — Z87891 Personal history of nicotine dependence: Secondary | ICD-10-CM | POA: Diagnosis not present

## 2017-04-03 DIAGNOSIS — R05 Cough: Secondary | ICD-10-CM | POA: Diagnosis not present

## 2017-04-03 DIAGNOSIS — J42 Unspecified chronic bronchitis: Secondary | ICD-10-CM | POA: Diagnosis not present

## 2017-04-03 DIAGNOSIS — E1129 Type 2 diabetes mellitus with other diabetic kidney complication: Secondary | ICD-10-CM | POA: Diagnosis not present

## 2017-05-28 DIAGNOSIS — H524 Presbyopia: Secondary | ICD-10-CM | POA: Diagnosis not present

## 2017-06-14 ENCOUNTER — Other Ambulatory Visit (INDEPENDENT_AMBULATORY_CARE_PROVIDER_SITE_OTHER): Payer: Medicare HMO

## 2017-06-14 ENCOUNTER — Ambulatory Visit (INDEPENDENT_AMBULATORY_CARE_PROVIDER_SITE_OTHER): Payer: Medicare HMO | Admitting: Internal Medicine

## 2017-06-14 ENCOUNTER — Encounter: Payer: Self-pay | Admitting: Internal Medicine

## 2017-06-14 VITALS — BP 116/70 | HR 71 | Ht 66.0 in | Wt 184.0 lb

## 2017-06-14 DIAGNOSIS — D509 Iron deficiency anemia, unspecified: Secondary | ICD-10-CM | POA: Diagnosis not present

## 2017-06-14 DIAGNOSIS — R0609 Other forms of dyspnea: Secondary | ICD-10-CM | POA: Insufficient documentation

## 2017-06-14 LAB — CBC WITH DIFFERENTIAL/PLATELET
BASOS PCT: 1.2 % (ref 0.0–3.0)
Basophils Absolute: 0.1 10*3/uL (ref 0.0–0.1)
EOS ABS: 0.1 10*3/uL (ref 0.0–0.7)
EOS PCT: 0.9 % (ref 0.0–5.0)
HEMATOCRIT: 31 % — AB (ref 39.0–52.0)
HEMOGLOBIN: 9.9 g/dL — AB (ref 13.0–17.0)
Lymphs Abs: 0.6 10*3/uL — ABNORMAL LOW (ref 0.7–4.0)
MCHC: 32 g/dL (ref 30.0–36.0)
MCV: 75.6 fl — ABNORMAL LOW (ref 78.0–100.0)
MONO ABS: 0.8 10*3/uL (ref 0.1–1.0)
Monocytes Relative: 9 % (ref 3.0–12.0)
NEUTROS ABS: 7.4 10*3/uL (ref 1.4–7.7)
Neutrophils Relative %: 81.8 % — ABNORMAL HIGH (ref 43.0–77.0)
PLATELETS: 427 10*3/uL — AB (ref 150.0–400.0)
RBC: 4.1 Mil/uL — ABNORMAL LOW (ref 4.22–5.81)
RDW: 17.2 % — AB (ref 11.5–15.5)
WBC: 9 10*3/uL (ref 4.0–10.5)

## 2017-06-14 LAB — BRAIN NATRIURETIC PEPTIDE: Pro B Natriuretic peptide (BNP): 55 pg/mL (ref 0.0–100.0)

## 2017-06-14 LAB — BASIC METABOLIC PANEL
BUN: 17 mg/dL (ref 6–23)
CALCIUM: 9.8 mg/dL (ref 8.4–10.5)
CHLORIDE: 104 meq/L (ref 96–112)
CO2: 24 meq/L (ref 19–32)
CREATININE: 1.05 mg/dL (ref 0.40–1.50)
GFR: 73.85 mL/min (ref >60.00)
GLUCOSE: 164 mg/dL — AB (ref 70–99)
Potassium: 4.4 mEq/L (ref 3.5–5.1)
Sodium: 137 mEq/L (ref 135–145)

## 2017-06-14 LAB — TSH: TSH: 2.58 u[IU]/mL (ref 0.35–4.50)

## 2017-06-14 NOTE — Patient Instructions (Signed)
You do not have significant copd at this point and likely never will   Pantoprazole (protonix) 40 mg   Take  30-60 min before first meal of the day and Pepcid (famotidine)  20 mg one @  bedtime until return to office - this is the best way to tell whether stomach acid is contributing to your problem.    GERD (REFLUX)  is an extremely common cause of respiratory symptoms just like yours , many times with no obvious heartburn at all.    It can be treated with medication, but also with lifestyle changes including elevation of the head of your bed (ideally with 6 inch  bed blocks),  Smoking cessation, avoidance of late meals, excessive alcohol, and avoid fatty foods, chocolate, peppermint, colas, red wine, and acidic juices such as orange juice.  NO MINT OR MENTHOL PRODUCTS SO NO COUGH DROPS   USE SUGARLESS CANDY INSTEAD (Jolley ranchers or Stover's or Life Savers) or even ice chips will also do - the key is to swallow to prevent all throat clearing. NO OIL BASED VITAMINS - use powdered substitutes.  Please schedule a follow up visit in 2  months but call sooner if needed for full pfts on return

## 2017-06-14 NOTE — Progress Notes (Signed)
Subjective:     Patient ID: Christian Woodard, male   DOB: [birth year],     MRN: [id number]  HPI   63 yowm quit smoking 2000 with mild doe @ wt 156  and did fine until around 2013 gave up pushing mower and gradually downhill since referred to pulmonary clinic 06/14/2017 by Dr   Willey Blade   06/14/2017 1st [facility] office visit/ Anita Mcadory  No resp rx  Chief Complaint  Patient presents with  . Pulmonary Consult    Referred by Dr. Asencion Noble. Pt c/o SOB x 6 months, esp worse over the past 3 months. He states he gets out of breath walking an incline, such as to his mailbox. He states that he sometimes he wakes up in the night feeling SOB.  He also c/o non prod cough.   doe x 5 years,  Stops at top of incline on way back to house x 6 months  = MMRC1 = can walk nl pace, flat grade, can't hurry or go uphills or steps s sob   Never tried inhalers Worse in heat and humbidity  freq wakes with dry cough / chest tightness sits up and goes away   No obvious day to day or daytime variability or assoc excess/ purulent sputum or mucus plugs or hemoptysis or cp or chest tightness, subjective wheeze or overt sinus or hb symptoms. No unusual exp hx or h/o childhood pna/ asthma or knowledge of premature birth.  Sleeping ok without nocturnal  or early am exacerbation  of respiratory  c/o's or need for noct saba. Also denies any obvious fluctuation of symptoms with weather or environmental changes or other aggravating or alleviating factors except as outlined above   Current Medications, Allergies, Complete Past Medical History, Past Surgical History, Family History, and Social History were reviewed in [employer] record.  ROS  The following are not active complaints unless bolded sore throat, dysphagia, dental problems, itching, sneezing,  nasal congestion or excess/ purulent secretions, ear ache,   fever, chills, sweats, unintended wt loss, classically pleuritic or exertional cp,  orthopnea  pnd or leg swelling, presyncope, palpitations, abdominal pain, anorexia, nausea, vomiting, diarrhea  or change in bowel or bladder habits, change in stools or urine, dysuria,hematuria,  rash, arthralgias, visual complaints, headache, numbness, weakness or ataxia or problems with walking or coordination,  change in mood/affect or memory.             Review of Systems     Objective:   Physical Exam     amb very pale wm nad    Wt Readings from Last 3 Encounters:  06/14/17 184 lb (83.5 kg)  02/06/17 185 lb (83.9 kg)  01/24/17 184 lb (83.5 kg)    Vital signs reviewed   - Note on arrival 02 sats  96% on RA      HEENT: nl   turbinates bilaterally, and oropharynx. Nl external ear canals without cough reflex - edentulous    NECK :  without JVD/Nodes/TM/ nl carotid upstrokes bilaterally   LUNGS: no acc muscle use,  Nl contour chest which is clear to A and P bilaterally without cough on insp or exp maneuvers   CV:  RRR  no s3 or murmur or increase in P2, and no edema   ABD:  Quite obese but soft  with poor inspiratory excursion in the supine position. No bruits or organomegaly appreciated, bowel sounds nl  MS:  Nl gait/ ext warm without deformities, calf tenderness,  cyanosis or clubbing No obvious joint restrictions   SKIN: warm and dry without lesions    NEURO:  alert, approp, nl sensorium with  no motor or cerebellar deficits apparent.      I personally reviewed images and agree with radiology impression as follows:  CXR:   04/03/17 Chronic bronchitic-smoking related changes, stable. No pneumonia, pulmonary edema, nor other acute cardiopulmonary abnormality.   Labs ordered/ reviewed:      Chemistry      Component Value Date/Time   NA 137 06/14/2017 1138   K 4.4 06/14/2017 1138   CL 104 06/14/2017 1138   CO2 24 06/14/2017 1138   BUN 17 06/14/2017 1138   CREATININE 1.05 06/14/2017 1138      Component Value Date/Time   CALCIUM 9.8 06/14/2017 1138        Lab  Results  Component Value Date   WBC 9.0 06/14/2017   HGB 9.9 (L) 06/14/2017   HCT 31.0 (L) 06/14/2017   MCV 75.6 (L) 06/14/2017   PLT 427.0 (H) 06/14/2017         Lab Results  Component Value Date   TSH 2.58 06/14/2017     Lab Results  Component Value Date   PROBNP 55.0 06/14/2017             Assessment:

## 2017-06-15 ENCOUNTER — Telehealth: Payer: Self-pay | Admitting: Internal Medicine

## 2017-06-15 DIAGNOSIS — D649 Anemia, unspecified: Secondary | ICD-10-CM | POA: Insufficient documentation

## 2017-06-15 LAB — RESPIRATORY ALLERGY PROFILE REGION II ~~LOC~~
Allergen, A. alternata, m6: <0.1 kU/L
Allergen, Comm Silver Birch, t9: <0.1 kU/L
Allergen, D pternoyssinus,d7: <0.1 kU/L
Allergen, Mulberry, t76: <0.1 kU/L
Allergen, Oak,t7: <0.1 kU/L
Box Elder IgE: <0.1 kU/L
Cat Dander: <0.1 kU/L
Cockroach: <0.1 kU/L
D. farinae: <0.1 kU/L
IgE (Immunoglobulin E), Serum: 38 kU/L (ref <115)
Johnson Grass: <0.1 kU/L
Rough Pigweed  IgE: <0.1 kU/L
Sheep Sorrel IgE: <0.1 kU/L

## 2017-06-15 MED ORDER — PANTOPRAZOLE SODIUM 40 MG PO TBEC: 40.0000 mg | DELAYED_RELEASE_TABLET | Freq: Every day | ORAL | 3 refills | Status: DC

## 2017-06-15 NOTE — Progress Notes (Signed)
LMTCB

## 2017-06-15 NOTE — Assessment & Plan Note (Signed)
Noted at ov for doe 06/14/2017 >  Follow up per Primary Care recommended   This can cause mild desats with heavy ex in pts with only mild v/q or ild issues as it drops the MV02 returning to heart so it magnifies the effects of any intrinsic lung dz and will need to be addressed before returns in 2 months

## 2017-06-15 NOTE — Progress Notes (Signed)
Spoke with pt and notified of results per Dr. Wert. Pt verbalized understanding and denied any questions. 

## 2017-06-15 NOTE — Telephone Encounter (Signed)
Call patient : Studies are c/w microcytic anemia which may be contributing to symptoms and will need f/u by PCP but no immediate need to do so and no change in recs from [facility] and spoke with pt and he is aware of results per MW>

## 2017-06-15 NOTE — Assessment & Plan Note (Addendum)
Spirometry 06/14/2017  FEV1 1.91 (70%)  Ratio 73 mild curvature  -  06/14/2017  Walked RA x 3 laps @ 185 ft each stopped due to  End of study, sats 89% and sob @ fast pace    When respiratory symptoms begin or become refractory well after a patient reports complete smoking cessation,  Especially when this wasn't the case while they were smoking, a red flag is raised based on the work of Dr Kris Mouton which states:  if you quit smoking when your best day FEV1 is still well preserved it is highly unlikely you will progress to severe disease.  That is to say, once the smoking stops,  the symptoms should not suddenly erupt or markedly worsen.  If so, the differential diagnosis should include  obesity/deconditioning,  LPR/Reflux/Aspiration syndromes,  occult CHF, or  especially side effect of medications commonly used in this population - only med I can identify that is assoc with sob is actos but says on it for years -  And Anemia/ thyroid dz    rec max rx for gerd and w/u for microcytic anemia then return for f/u in 2 m with full pfts     Total time devoted to counseling  > 50 % of initial 60 min office visit:  review case with pt/ discussion of options/alternatives/ personally creating written customized instructions  in presence of pt  then going over those specific  Instructions directly with the pt including how to use all of the meds but in particular covering each new medication in detail and the difference between the maintenance= "automatic" meds and the prns using an action plan format for the latter (If this problem/symptom => do that organization reading Left to right).  Please see AVS from this visit for a full list of these instructions which I personally wrote for this pt and  are unique to this visit.

## 2017-08-06 DIAGNOSIS — E1149 Type 2 diabetes mellitus with other diabetic neurological complication: Secondary | ICD-10-CM | POA: Diagnosis not present

## 2017-08-13 DIAGNOSIS — R0609 Other forms of dyspnea: Secondary | ICD-10-CM | POA: Diagnosis not present

## 2017-08-13 DIAGNOSIS — E1129 Type 2 diabetes mellitus with other diabetic kidney complication: Secondary | ICD-10-CM | POA: Diagnosis not present

## 2017-08-13 DIAGNOSIS — D509 Iron deficiency anemia, unspecified: Secondary | ICD-10-CM | POA: Diagnosis not present

## 2017-08-13 DIAGNOSIS — D649 Anemia, unspecified: Secondary | ICD-10-CM | POA: Diagnosis not present

## 2017-08-20 ENCOUNTER — Ambulatory Visit (INDEPENDENT_AMBULATORY_CARE_PROVIDER_SITE_OTHER): Payer: Medicare HMO | Admitting: Internal Medicine

## 2017-08-20 ENCOUNTER — Encounter: Payer: Self-pay | Admitting: Internal Medicine

## 2017-08-20 VITALS — BP 116/60 | HR 89 | Ht 65.5 in | Wt 179.0 lb

## 2017-08-20 DIAGNOSIS — R0609 Other forms of dyspnea: Secondary | ICD-10-CM | POA: Diagnosis not present

## 2017-08-20 DIAGNOSIS — D509 Iron deficiency anemia, unspecified: Secondary | ICD-10-CM | POA: Diagnosis not present

## 2017-08-20 LAB — PULMONARY FUNCTION TEST
DL/VA % pred: 78 %
DL/VA: 3.37 ml/min/mmHg/L
DLCO COR % PRED: 55 %
DLCO COR: 14.67 ml/min/mmHg
DLCO UNC % PRED: 45 %
DLCO unc: 11.95 ml/min/mmHg
FEF 25-75 POST: 1.87 L/s
FEF 25-75 Pre: 1.54 L/sec
FEF2575-%Change-Post: 21 %
FEF2575-%PRED-PRE: 78 %
FEF2575-%Pred-Post: 94 %
FEV1-%CHANGE-POST: 2 %
FEV1-%PRED-POST: 84 %
FEV1-%Pred-Pre: 82 %
FEV1-Post: 2.23 L
FEV1-Pre: 2.18 L
FEV1FVC-%Change-Post: 2 %
FEV1FVC-%Pred-Pre: 102 %
FEV6-%CHANGE-POST: 0 %
FEV6-%PRED-POST: 85 %
FEV6-%Pred-Pre: 86 %
FEV6-POST: 2.9 L
FEV6-Pre: 2.91 L
FEV6FVC-%Change-Post: 0 %
FEV6FVC-%PRED-POST: 106 %
FEV6FVC-%Pred-Pre: 107 %
FVC-%Change-Post: 0 %
FVC-%PRED-POST: 80 %
FVC-%Pred-Pre: 80 %
FVC-POST: 2.93 L
FVC-Pre: 2.92 L
POST FEV1/FVC RATIO: 76 %
PRE FEV1/FVC RATIO: 75 %
Post FEV6/FVC ratio: 99 %
Pre FEV6/FVC Ratio: 100 %
RV % pred: 86 %
RV: 1.93 L
TLC % PRED: 78 %
TLC: 4.82 L

## 2017-08-20 NOTE — Assessment & Plan Note (Signed)
W/u in progress > empirical gerd rx can be adjusted up or down by GI/ Dr Willey Blade but has not really helped either the anemia or the atypical noct spells so no long term need from my perspective

## 2017-08-20 NOTE — Progress Notes (Signed)
PFT done today. 

## 2017-08-20 NOTE — Assessment & Plan Note (Signed)
Spirometry 06/14/2017  FEV1 1.91 (70%)  Ratio 73 mild curvature  -  06/14/2017  Walked RA x 3 laps @ 185 ft each stopped due to  End of study, sats 89% and sob @ fast pace  - Allergy profile 06/14/17 >  Eos 0.1/  IgE    38 neg RAST   - PFT's  08/20/2017  FEV1 2.23 (84 % ) ratio 76  p 2 % improvement from saba p nothing prior to study with DLCO  45/55c % corrects to 78 % for alv volume    The correction for dlco shows he still has significant anemia but as suspected there is no copd here   If continues to be sob p correction of anemia the next step would be trial off actos plus possible cards eval > Follow up per Primary Care planned     I had an extended discussion with the patient/wife  reviewing all relevant studies completed to date and  lasting 15 to 20 minutes of a 25 minute visit    Each maintenance medication was reviewed in detail including most importantly the difference between maintenance and prns and under what circumstances the prns are to be triggered using an action plan format that is not reflected in the computer generated alphabetically organized AVS.    Please see AVS for specific instructions unique to this visit that I personally wrote and verbalized to the the pt in detail and then reviewed with pt  by my nurse highlighting any  changes in therapy recommended at today's visit to their plan of care.

## 2017-08-20 NOTE — Progress Notes (Signed)
Subjective:     Patient ID: Christian Woodard, male   DOB: 01-29-1946     MRN: [id number]    Brief patient profile:  47 yowm quit smoking 2000 with mild doe @ wt 156  and did fine until around 2013 gave up pushing mower and gradually downhill since referred to pulmonary clinic 06/14/2017 by Dr   Christian Woodard   06/14/2017 1st [facility] office visit/ Christian Woodard  No resp rx  Chief Complaint  Patient presents with  . Pulmonary Consult    Referred by Dr. Asencion Woodard. Pt c/o SOB x 6 months, esp worse over the past 3 months. He states he gets out of breath walking an incline, such as to his mailbox. He states that he sometimes he wakes up in the night feeling SOB.  He also c/o non prod cough.   doe x 5 years,  Stops at top of incline on way back to house x 6 months  = MMRC1 = can walk nl pace, flat grade, can't hurry or go uphills or steps s sob   Never tried inhalers Worse in heat and humbidity  freq wakes with dry cough / chest tightness sits up and goes away  rec You do not have significant copd at this point and likely never will  Pantoprazole (protonix) 40 mg   Take  30-60 min before first meal of the day and Pepcid (famotidine)  20 mg one @  bedtime until return to office - this is the best way to tell whether stomach acid is contributing to your problem.   GERD diet     08/20/2017  f/u ov/Christian Woodard re: doe on gerd rx/ gi eval in progress for anemia/ no resp rx  Chief Complaint  Patient presents with  . Follow-up    PFT's done today. Increased SOB for the past day. Chest feels heavy today.  continues to have noct spells of sob despite nl BNP and no evidence of chf on cxr or obvious anemia Tells me his hgb still tredning down and GI eval in progess  No obvious day to day or daytime variability or assoc excess/ purulent sputum or mucus plugs or hemoptysis  subjective wheeze or overt sinus or hb symptoms. No unusual exp hx or h/o childhood pna/ asthma or knowledge of premature birth.   .Also denies  any obvious fluctuation of symptoms with weather or environmental changes or other aggravating or alleviating factors except as outlined above   Current Allergies, Complete Past Medical History, Past Surgical History, Family History, and Social History were reviewed in [employer] record.  ROS  The following are not active complaints unless bolded sore throat, dysphagia, dental problems, itching, sneezing,  nasal congestion or disharge of excess mucus or purulent secretions, ear ache,   fever, chills, sweats, unintended wt loss or wt gain, classically pleuritic or exertional cp,  orthopnea pnd or leg swelling, presyncope, palpitations, abdominal pain, anorexia, nausea, vomiting, diarrhea  or change in bowel habits or bladder habits, change in stools or change in urine, dysuria, hematuria,  rash, arthralgias, visual complaints, headache, numbness, weakness or ataxia or problems with walking or coordination,  change in mood/affect or memory.        Current Meds  Medication Sig  . acetaminophen (TYLENOL) 500 MG tablet Take 1,000 mg by mouth every 6 (six) hours as needed for moderate pain or headache.  Marland Kitchen aspirin 81 MG tablet Take 81 mg by mouth daily.    Marland Kitchen atorvastatin (LIPITOR) 10  MG tablet Take 10 mg by mouth at bedtime.    . famotidine (PEPCID) 20 MG tablet Take 20 mg by mouth at bedtime.  Marland Kitchen glipiZIDE (GLUCOTROL XL) 5 MG 24 hr tablet Take 5 mg by mouth daily with breakfast.  . LANTUS SOLOSTAR 100 UNIT/ML Solostar Pen Inject 14 Units into the skin daily. At bedtime  . losartan (COZAAR) 50 MG tablet Take 50 mg by mouth daily.    . metFORMIN (GLUCOPHAGE) 1000 MG tablet Take 1,000 mg by mouth 2 (two) times daily.  . pantoprazole (PROTONIX) 40 MG tablet Take 1 tablet (40 mg total) by mouth daily. Take 30-60 mins before breakfast.  . pioglitazone (ACTOS) 45 MG tablet Take 45 mg by mouth daily.                    Objective:   Physical Exam     amb very pale wm nad      08/20/2017      179   06/14/17 184 lb (83.5 kg)  02/06/17 185 lb (83.9 kg)  01/24/17 184 lb (83.5 kg)    Vital signs reviewed   - Note on arrival 02 sats  93% on RA      HEENT: nl   turbinates bilaterally, and oropharynx. Nl external ear canals without cough reflex - edentulous    NECK :  without JVD/Nodes/TM/ nl carotid upstrokes bilaterally   LUNGS: no acc muscle use,  Nl contour chest which is clear to A and P bilaterally without cough on insp or exp maneuvers   CV:  RRR  no s3 or murmur or increase in P2, and no edema   ABD:  Moderately  obese but soft  with poor inspiratory excursion in the supine position. No bruits or organomegaly appreciated, bowel sounds nl  MS:  Nl gait/ ext warm without deformities, calf tenderness, cyanosis or clubbing No obvious joint restrictions   SKIN: warm and dry without lesions    NEURO:  alert, approp, nl sensorium with  no motor or cerebellar deficits apparent.      I personally reviewed images and agree with radiology impression as follows:  CXR:   04/03/17 Chronic bronchitic-smoking related changes, stable. No pneumonia, pulmonary edema, nor other acute cardiopulmonary abnormality.                      Assessment:

## 2017-08-20 NOTE — Patient Instructions (Signed)
You do not have any significant copd  If you continue to have breathing issues after your anemia is corrected might consider trial off ACTOS next and /or cardiology evaluation   Pulmonary follow up is as needed

## 2017-08-30 ENCOUNTER — Other Ambulatory Visit: Payer: Self-pay

## 2017-08-30 ENCOUNTER — Other Ambulatory Visit: Payer: Self-pay | Admitting: Gastroenterology

## 2017-08-30 ENCOUNTER — Encounter: Payer: Self-pay | Admitting: Gastroenterology

## 2017-08-30 ENCOUNTER — Ambulatory Visit (INDEPENDENT_AMBULATORY_CARE_PROVIDER_SITE_OTHER): Payer: Medicare HMO | Admitting: Gastroenterology

## 2017-08-30 VITALS — BP 115/65 | HR 93 | Temp 97.3°F | Ht 66.0 in | Wt 177.4 lb

## 2017-08-30 DIAGNOSIS — R195 Other fecal abnormalities: Secondary | ICD-10-CM

## 2017-08-30 DIAGNOSIS — D509 Iron deficiency anemia, unspecified: Secondary | ICD-10-CM

## 2017-08-30 LAB — CBC WITH DIFFERENTIAL/PLATELET
BASOS ABS: 140 {cells}/uL (ref 0–200)
Basophils Relative: 1.5 %
EOS PCT: 1.2 %
Eosinophils Absolute: 112 cells/uL (ref 15–500)
HEMATOCRIT: 30.6 % — AB (ref 38.5–50.0)
HEMOGLOBIN: 9.2 g/dL — AB (ref 13.2–17.1)
LYMPHS ABS: 1032 {cells}/uL (ref 850–3900)
MCH: 21.2 pg — ABNORMAL LOW (ref 27.0–33.0)
MCHC: 30.1 g/dL — ABNORMAL LOW (ref 32.0–36.0)
MCV: 70.5 fL — AB (ref 80.0–100.0)
MPV: 10.1 fL (ref 7.5–12.5)
Monocytes Relative: 11.2 %
NEUTROS ABS: 6975 {cells}/uL (ref 1500–7800)
Neutrophils Relative %: 75 %
Platelets: 475 10*3/uL — ABNORMAL HIGH (ref 140–400)
RBC: 4.34 10*6/uL (ref 4.20–5.80)
RDW: 17.7 % — ABNORMAL HIGH (ref 11.0–15.0)
Total Lymphocyte: 11.1 %
WBC: 9.3 10*3/uL (ref 3.8–10.8)
WBCMIX: 1042 {cells}/uL — AB (ref 200–950)

## 2017-08-30 NOTE — Progress Notes (Signed)
Primary Care Physician: Asencion Noble, MD  Primary Gastroenterologist:  Garfield Cornea, MD   Chief Complaint  Patient presents with  . Anemia    HPI: Christian Woodard is a 71 y.o. male here for further evaluation of anemia. We last saw the patient at time of surveillance colonoscopy in October 2017. He had diverticulosis but otherwise unremarkable. Since we last saw him he has been diagnosed with microcytic anemia. Initially developed shortness of breath for 5 months ago. Saw Dr. Willey Blade. Chest x-ray showed chronic bronchitic changes. Seen by Dr. Melvyn Novas to obtain lab work revealed hemoglobin 9.9, hematocrit 31, MCV 75.6, platelets 427,000. White blood cell count was normal. Creatinine normal at 1.05. Subsequently patient saw Dr. Willey Blade last month. Reportedly heme positive. Hemoglobin down a bit more but did not require transfusion.  Patient denies any GI symptoms. Stools are dark but not black. No red blood. No abdominal pain. No heartburn, dysphagia. Intentional weight loss of 5 pounds. He's had some dizziness. Continues to have some dyspnea on exertion. Positive fatigability. He states he has been on protonic's since July at the request of Dr. Melvyn Novas.    Current Outpatient Prescriptions  Medication Sig Dispense Refill  . acetaminophen (TYLENOL) 500 MG tablet Take 1,000 mg by mouth every 6 (six) hours as needed for moderate pain or headache.    Marland Kitchen aspirin 81 MG tablet Take 81 mg by mouth daily.      Marland Kitchen atorvastatin (LIPITOR) 10 MG tablet Take 10 mg by mouth at bedtime.      . famotidine (PEPCID) 20 MG tablet Take 20 mg by mouth at bedtime.    Marland Kitchen glipiZIDE (GLUCOTROL XL) 5 MG 24 hr tablet Take 5 mg by mouth daily with breakfast.    . LANTUS SOLOSTAR 100 UNIT/ML Solostar Pen Inject 14 Units into the skin daily. At bedtime    . losartan (COZAAR) 50 MG tablet Take 50 mg by mouth daily.      . metFORMIN (GLUCOPHAGE) 1000 MG tablet Take 1,000 mg by mouth 2 (two) times daily.    . pantoprazole  (PROTONIX) 40 MG tablet Take 1 tablet (40 mg total) by mouth daily. Take 30-60 mins before breakfast. 30 tablet 3  . pioglitazone (ACTOS) 45 MG tablet Take 45 mg by mouth daily.      No current facility-administered medications for this visit.     Allergies as of 08/30/2017  . (No Known Allergies)   Past Medical History:  Diagnosis Date  . Arthritis   . Diabetes mellitus   . Hypercholesteremia    Past Surgical History:  Procedure Laterality Date  . BACK SURGERY  2000  . CHOLECYSTECTOMY    . COLONOSCOPY  09/15/2011   Dr. Gala Romney: Suboptimal prep. Pancolonic diverticulosis. Tubular adenomas. Next colonoscopy 5 years.  . COLONOSCOPY N/A 09/22/2016   Procedure: COLONOSCOPY;  Surgeon: Daneil Dolin, MD;  Location: [facility];  Service: Endoscopy;  Laterality: N/A;  1100  . HERNIA REPAIR     as a child  . Right cornea transplant    . TONSILLECTOMY     Family History  Problem Relation Age of Onset  . Heart disease Mother   . Heart disease Father   . Emphysema Father        smoked  . Lung cancer Father        smoked  . Colon cancer Neg Hx    Social History  Substance Use Topics  . Smoking status: Former Smoker  Packs/day: 2.00    Years: 36.00    Quit date: 11/27/1998  . Smokeless tobacco: Never Used  . Alcohol use No    ROS:  General: Negative for anorexia, weight loss, fever, chills, see hpi ENT: Negative for hoarseness, difficulty swallowing , nasal congestion. CV: Negative for chest pain, angina, palpitations,   peripheral edema. See hpi Respiratory: Negative for dyspnea at rest, dyspnea on exertion, cough, sputum, wheezing.  GI: See history of present illness. GU:  Negative for dysuria, hematuria, urinary incontinence, urinary frequency, nocturnal urination.  Endo: Negative for unusual weight change.    Physical Examination:   BP 115/65   Pulse 93   Temp (!) 97.3 F (36.3 C) (Oral)   Ht 5\' 6"  (1.676 m)   Wt 177 lb 6.4 oz (80.5 kg)   BMI 28.63 kg/m    General: Well-nourished, well-developed in no acute distress. Very pale Eyes: No icterus. Mouth: Oropharyngeal mucosa moist and pink , no lesions erythema or exudate. Lungs: Clear to auscultation bilaterally.  Heart: Regular rate and rhythm, no murmurs rubs or gallops.  Abdomen: Bowel sounds are normal, nontender, nondistended, no hepatosplenomegaly or masses, no abdominal bruits or hernia , no rebound or guarding.   Extremities: No lower extremity edema. No clubbing or deformities. Neuro: Alert and oriented x 4   Skin: Warm and dry, no jaundice.   Psych: Alert and cooperative, normal mood and affect.  Labs:   Lab Results  Component Value Date   WBC 9.0 06/14/2017   HGB 9.9 (L) 06/14/2017   HCT 31.0 (L) 06/14/2017   MCV 75.6 (L) 06/14/2017   PLT 427.0 (H) 06/14/2017   No results found for: IRON, TIBC, FERRITIN  Lab Results  Component Value Date   CREATININE 1.05 06/14/2017   BUN 17 06/14/2017   NA 137 06/14/2017   K 4.4 06/14/2017   CL 104 06/14/2017   CO2 24 06/14/2017   No results found for: ALT, AST, GGT, ALKPHOS, BILITOT  Imaging Studies: No results found.

## 2017-08-30 NOTE — Patient Instructions (Signed)
1. Upper endoscopy as scheduled.  2. Please have your labs done today. 3. Continue pantoprazole once daily.

## 2017-08-30 NOTE — Assessment & Plan Note (Signed)
71 year old gentleman with new microcytic anemia likely iron deficiency anemia, Hemoccult positive stool. Colonoscopy is up-to-date. Recommend upper endoscopy as soon as possible. If upper endoscopy is unremarkable, he will need further evaluation, potentially capsule endoscopy.  I have discussed the risks, alternatives, benefits with regards to but not limited to the risk of reaction to medication, bleeding, infection, perforation and the patient is agreeable to proceed. Written consent to be obtained.  Will update his CBC today.

## 2017-08-30 NOTE — Progress Notes (Signed)
cc'ed to pcp °

## 2017-08-31 ENCOUNTER — Encounter (HOSPITAL_COMMUNITY)
Admission: RE | Disposition: A | Discharge status: Home / Self Care | Payer: Self-pay | Source: Ambulatory Visit | Attending: Internal Medicine

## 2017-08-31 ENCOUNTER — Ambulatory Visit (HOSPITAL_COMMUNITY)
Admission: RE | Admit: 2017-08-31 | Discharge: 2017-08-31 | Disposition: A | Discharge status: Home / Self Care | Payer: Medicare HMO | Source: Ambulatory Visit | Attending: Internal Medicine | Admitting: Internal Medicine

## 2017-08-31 ENCOUNTER — Encounter (HOSPITAL_COMMUNITY): Payer: Self-pay | Admitting: *Deleted

## 2017-08-31 DIAGNOSIS — K22710 Barrett's esophagus with low grade dysplasia: Secondary | ICD-10-CM | POA: Insufficient documentation

## 2017-08-31 DIAGNOSIS — Z794 Long term (current) use of insulin: Secondary | ICD-10-CM | POA: Diagnosis not present

## 2017-08-31 DIAGNOSIS — E119 Type 2 diabetes mellitus without complications: Secondary | ICD-10-CM | POA: Diagnosis not present

## 2017-08-31 DIAGNOSIS — D509 Iron deficiency anemia, unspecified: Secondary | ICD-10-CM | POA: Diagnosis not present

## 2017-08-31 DIAGNOSIS — E7800 Pure hypercholesterolemia, unspecified: Secondary | ICD-10-CM | POA: Diagnosis not present

## 2017-08-31 DIAGNOSIS — Z801 Family history of malignant neoplasm of trachea, bronchus and lung: Secondary | ICD-10-CM | POA: Diagnosis not present

## 2017-08-31 DIAGNOSIS — Z79899 Other long term (current) drug therapy: Secondary | ICD-10-CM | POA: Diagnosis not present

## 2017-08-31 DIAGNOSIS — K2970 Gastritis, unspecified, without bleeding: Secondary | ICD-10-CM | POA: Diagnosis not present

## 2017-08-31 DIAGNOSIS — Z87891 Personal history of nicotine dependence: Secondary | ICD-10-CM | POA: Diagnosis not present

## 2017-08-31 DIAGNOSIS — Z7982 Long term (current) use of aspirin: Secondary | ICD-10-CM | POA: Insufficient documentation

## 2017-08-31 DIAGNOSIS — K228 Other specified diseases of esophagus: Secondary | ICD-10-CM

## 2017-08-31 DIAGNOSIS — K449 Diaphragmatic hernia without obstruction or gangrene: Secondary | ICD-10-CM | POA: Insufficient documentation

## 2017-08-31 DIAGNOSIS — K319 Disease of stomach and duodenum, unspecified: Secondary | ICD-10-CM | POA: Insufficient documentation

## 2017-08-31 DIAGNOSIS — Z791 Long term (current) use of non-steroidal anti-inflammatories (NSAID): Secondary | ICD-10-CM | POA: Insufficient documentation

## 2017-08-31 DIAGNOSIS — R195 Other fecal abnormalities: Secondary | ICD-10-CM

## 2017-08-31 DIAGNOSIS — K2961 Other gastritis with bleeding: Secondary | ICD-10-CM | POA: Diagnosis not present

## 2017-08-31 HISTORY — PX: ESOPHAGOGASTRODUODENOSCOPY: SHX5428

## 2017-08-31 LAB — FERRITIN: Ferritin: 11 ng/mL — ABNORMAL LOW (ref 24–336)

## 2017-08-31 LAB — GLUCOSE, CAPILLARY: GLUCOSE-CAPILLARY: 116 mg/dL — AB (ref 65–99)

## 2017-08-31 SURGERY — EGD (ESOPHAGOGASTRODUODENOSCOPY)
Anesthesia: Moderate Sedation

## 2017-08-31 MED ORDER — MEPERIDINE HCL 100 MG/ML IJ SOLN
INTRAMUSCULAR | Status: DC | PRN
  Administered 2017-08-31: 50 mg

## 2017-08-31 MED ORDER — LIDOCAINE VISCOUS 2 % MT SOLN
OROMUCOSAL | Status: DC | PRN
  Administered 2017-08-31: 1 via OROMUCOSAL

## 2017-08-31 MED ORDER — ONDANSETRON HCL 4 MG/2ML IJ SOLN
INTRAMUSCULAR | Status: AC
  Filled 2017-08-31: qty 2

## 2017-08-31 MED ORDER — MEPERIDINE HCL 100 MG/ML IJ SOLN
INTRAMUSCULAR | Status: AC
  Filled 2017-08-31: qty 2

## 2017-08-31 MED ORDER — SODIUM CHLORIDE 0.9 % IV SOLN
INTRAVENOUS | Status: DC
  Administered 2017-08-31: 1000 mL via INTRAVENOUS

## 2017-08-31 MED ORDER — MIDAZOLAM HCL 5 MG/5ML IJ SOLN
INTRAMUSCULAR | Status: DC | PRN
  Administered 2017-08-31: 1 mg via INTRAVENOUS
  Administered 2017-08-31: 2 mg via INTRAVENOUS

## 2017-08-31 MED ORDER — MIDAZOLAM HCL 5 MG/5ML IJ SOLN
INTRAMUSCULAR | Status: AC
  Filled 2017-08-31: qty 10

## 2017-08-31 MED ORDER — LIDOCAINE VISCOUS 2 % MT SOLN
OROMUCOSAL | Status: AC
  Filled 2017-08-31: qty 15

## 2017-08-31 NOTE — Discharge Instructions (Signed)
Gastroesophageal Reflux Disease, Adult Normally, food travels down the esophagus and stays in the stomach to be digested. If a person has gastroesophageal reflux disease (GERD), food and stomach acid move back up into the esophagus. When this happens, the esophagus becomes sore and swollen (inflamed). Over time, GERD can make small holes (ulcers) in the lining of the esophagus. Follow these instructions at home: Diet  Follow a diet as told by your doctor. You may need to avoid foods and drinks such as: ? Coffee and tea (with or without caffeine). ? Drinks that contain alcohol. ? Energy drinks and sports drinks. ? Carbonated drinks or sodas. ? Chocolate and cocoa. ? Peppermint and mint flavorings. ? Garlic and onions. ? Horseradish. ? Spicy and acidic foods, such as peppers, chili powder, curry powder, vinegar, hot sauces, and BBQ sauce. ? Citrus fruit juices and citrus fruits, such as oranges, lemons, and limes. ? Tomato-based foods, such as red sauce, chili, salsa, and pizza with red sauce. ? Fried and fatty foods, such as donuts, french fries, potato chips, and high-fat dressings. ? High-fat meats, such as hot dogs, rib eye steak, sausage, ham, and bacon. ? High-fat dairy items, such as whole milk, butter, and cream cheese.  Eat small meals often. Avoid eating large meals.  Avoid drinking large amounts of liquid with your meals.  Avoid eating meals during the 2-3 hours before bedtime.  Avoid lying down right after you eat.  Do not exercise right after you eat. General instructions  Pay attention to any changes in your symptoms.  Take over-the-counter and prescription medicines only as told by your doctor. Do not take aspirin, ibuprofen, or other NSAIDs unless your doctor says it is okay.  Do not use any tobacco products, including cigarettes, chewing tobacco, and e-cigarettes. If you need help quitting, ask your doctor.  Wear loose clothes. Do not wear anything tight around  your waist.  Raise (elevate) the head of your bed about 6 inches (15 cm).  Try to lower your stress. If you need help doing this, ask your doctor.  If you are overweight, lose an amount of weight that is healthy for you. Ask your doctor about a safe weight loss goal.  Keep all follow-up visits as told by your doctor. This is important. Contact a doctor if:  You have new symptoms.  You lose weight and you do not know why it is happening.  You have trouble swallowing, or it hurts to swallow.  You have wheezing or a cough that keeps happening.  Your symptoms do not get better with treatment.  You have a hoarse voice. Get help right away if:  You have pain in your arms, neck, jaw, teeth, or back.  You feel sweaty, dizzy, or light-headed.  You have chest pain or shortness of breath.  You throw up (vomit) and your throw up looks like blood or coffee grounds.  You pass out (faint).  Your poop (stool) is bloody or black.  You cannot swallow, drink, or eat. This information is not intended to replace advice given to you by your health care provider. Make sure you discuss any questions you have with your health care provider. Document Released: 05/01/2008 Document Revised: 04/20/2016 Document Reviewed: 03/10/2015 Elsevier Interactive Patient Education  2018 [place] for Gastroesophageal Reflux Disease, Adult When you have gastroesophageal reflux disease (GERD), the foods you eat and your eating habits are very important. Choosing the right foods can help ease the discomfort of GERD. Consider  working with a diet and nutrition specialist (dietitian) to help you make healthy food choices. What general guidelines should I follow? Eating plan  Choose healthy foods low in fat, such as fruits, vegetables, whole grains, low-fat dairy products, and lean meat, fish, and poultry.  Eat frequent, small meals instead of three large meals each day. Eat your meals slowly, in a  relaxed setting. Avoid bending over or lying down until 2-3 hours after eating.  Limit high-fat foods such as fatty meats or fried foods.  Limit your intake of oils, butter, and shortening to less than 8 teaspoons each day.  Avoid the following: ? Foods that cause symptoms. These may be different for different people. Keep a food diary to keep track of foods that cause symptoms. ? Alcohol. ? Drinking large amounts of liquid with meals. ? Eating meals during the 2-3 hours before bed.  Cook foods using methods other than frying. This may include baking, grilling, or broiling. Lifestyle   Maintain a healthy weight. Ask your health care provider what weight is healthy for you. If you need to lose weight, work with your health care provider to do so safely.  Exercise for at least 30 minutes on 5 or more days each week, or as told by your health care provider.  Avoid wearing clothes that fit tightly around your waist and chest.  Do not use any products that contain nicotine or tobacco, such as cigarettes and e-cigarettes. If you need help quitting, ask your health care provider.  Sleep with the head of your bed raised. Use a wedge under the mattress or blocks under the bed frame to raise the head of the bed. What foods are not recommended? The items listed may not be a complete list. Talk with your dietitian about what dietary choices are best for you. Grains Pastries or quick breads with added fat. [place] toast. Vegetables Deep fried vegetables. [place] fries. Any vegetables prepared with added fat. Any vegetables that cause symptoms. For some people this may include tomatoes and tomato products, chili peppers, onions and garlic, and horseradish. Fruits Any fruits prepared with added fat. Any fruits that cause symptoms. For some people this may include citrus fruits, such as oranges, grapefruit, pineapple, and lemons. Meats and other protein foods High-fat meats, such as fatty beef or  pork, hot dogs, ribs, ham, sausage, salami and bacon. Fried meat or protein, including fried fish and fried chicken. Nuts and nut butters. Dairy Whole milk and chocolate milk. Sour cream. Cream. Ice cream. Cream cheese. Milk shakes. Beverages Coffee and tea, with or without caffeine. Carbonated beverages. Sodas. Energy drinks. Fruit juice made with acidic fruits (such as orange or grapefruit). Tomato juice. Alcoholic drinks. Fats and oils Butter. Margarine. Shortening. Ghee. Sweets and desserts Chocolate and cocoa. Donuts. Seasoning and other foods Pepper. Peppermint and spearmint. Any condiments, herbs, or seasonings that cause symptoms. For some people, this may include curry, hot sauce, or vinegar-based salad dressings. Summary  When you have gastroesophageal reflux disease (GERD), food and lifestyle choices are very important to help ease the discomfort of GERD.  Eat frequent, small meals instead of three large meals each day. Eat your meals slowly, in a relaxed setting. Avoid bending over or lying down until 2-3 hours after eating.  Limit high-fat foods such as fatty meat or fried foods. This information is not intended to replace advice given to you by your health care provider. Make sure you discuss any questions you have with your health care  provider. Document Released: 11/13/2005 Document Revised: 11/14/2016 Document Reviewed: 11/14/2016 Elsevier Interactive Patient Education  2017 [place]. EGD Discharge instructions Please read the instructions outlined below and refer to this sheet in the next few weeks. These discharge instructions provide you with general information on caring for yourself after you leave the hospital. Your doctor may also give you specific instructions. While your treatment has been planned according to the most current medical practices available, unavoidable complications occasionally occur. If you have any problems or questions after discharge, please  call your doctor. ACTIVITY  You may resume your regular activity but move at a slower pace for the next 24 hours.   Take frequent rest periods for the next 24 hours.   Walking will help expel (get rid of) the air and reduce the bloated feeling in your abdomen.   No driving for 24 hours (because of the anesthesia (medicine) used during the test).   You may shower.   Do not sign any important legal documents or operate any machinery for 24 hours (because of the anesthesia used during the test).  NUTRITION  Drink plenty of fluids.   You may resume your normal diet.   Begin with a light meal and progress to your normal diet.   Avoid alcoholic beverages for 24 hours or as instructed by your caregiver.  MEDICATIONS  You may resume your normal medications unless your caregiver tells you otherwise.  WHAT YOU CAN EXPECT TODAY  You may experience abdominal discomfort such as a feeling of fullness or gas pains.  FOLLOW-UP  Your doctor will discuss the results of your test with you.  SEEK IMMEDIATE MEDICAL ATTENTION IF ANY OF THE FOLLOWING OCCUR:  Excessive nausea (feeling sick to your stomach) and/or vomiting.   Severe abdominal pain and distention (swelling).   Trouble swallowing.   Temperature over 101 F (37.8 C).   Rectal bleeding or vomiting of blood.    GERD information provided  Continue Protonix 40 mg daily  Serum ferritin today  Further recommendations to follow pending review of pathology report

## 2017-08-31 NOTE — Progress Notes (Signed)
Please let patient know H/H stable. egd for today as planned.

## 2017-08-31 NOTE — Interval H&P Note (Signed)
History and Physical Interval Note:  08/31/2017 11:18 AM  Christian Woodard  has presented today for surgery, with the diagnosis of heme+ stool, IDA  The various methods of treatment have been discussed with the patient and family. After consideration of risks, benefits and other options for treatment, the patient has consented to  Procedure(s) with comments: ESOPHAGOGASTRODUODENOSCOPY (EGD) (N/A) - 11:15am as a surgical intervention .  The patient's history has been reviewed, patient examined, no change in status, stable for surgery.  I have reviewed the patient's chart and labs.  Questions were answered to the patient's satisfaction.      No upper GI tract symptoms. No dysphagia. Protonix added recently by pulmonologist to cover the possibility of GERD exacerbating pulmonary issues.  EGD today per plan.  The risks, benefits, limitations, alternatives and imponderables have been reviewed with the patient. Potential for esophageal dilation, biopsy, etc. have also been reviewed.  Questions have been answered. All parties agreeable. Manus Rudd

## 2017-08-31 NOTE — H&P (View-Only) (Signed)
Please let patient know H/H stable. egd for today as planned.

## 2017-08-31 NOTE — Op Note (Signed)
[facility] Patient Name: Christian Woodard Procedure Date: 08/31/2017 10:58 AM MRN: [id number] Date of Birth: September 08, 1946 Attending MD: Norvel Richards , MD CSN: [id number] Age: 71 Admit Type: Outpatient Procedure:                Upper GI endoscopy Indications:              Unexplained iron deficiency anemia Providers:                Norvel Richards, MD, Janeece Riggers, RN, Aram Candela Referring MD:              Medicines:                Midazolam 3 mg IV, Meperidine 50 mg IV, Ondansetron                            4 mg IV Complications:            No immediate complications. Estimated Blood Loss:     Estimated blood loss was minimal. Procedure:                Pre-Anesthesia Assessment:                           - Prior to the procedure, a History and Physical                            was performed, and patient medications and                            allergies were reviewed. The patient's tolerance of                            previous anesthesia was also reviewed. The risks                            and benefits of the procedure and the sedation                            options and risks were discussed with the patient.                            All questions were answered, and informed consent                            was obtained. Prior Anticoagulants: The patient has                            taken no previous anticoagulant or antiplatelet                            agents. ASA Grade Assessment: II - A patient with  mild systemic disease. After reviewing the risks                            and benefits, the patient was deemed in                            satisfactory condition to undergo the procedure.                           After obtaining informed consent, the endoscope was                            passed under direct vision. Throughout the                            procedure, the patient's  blood pressure, pulse, and                            oxygen saturations were monitored continuously. The                            [id number]) scope was introduced through the                            mouth, and advanced to the second part of duodenum.                            The upper GI endoscopy was accomplished without                            difficulty. The patient tolerated the procedure                            well. The upper GI endoscopy was accomplished                            without difficulty. The patient tolerated the                            procedure well. Scope In: 11:26:38 AM Scope Out: 11:34:07 AM Total Procedure Duration: 0 hours 7 minutes 29 seconds  Findings:      Mucosal changes were found in the esophagus. Salmon-colored epithelium       coming up in 3 "tongues" 3 cm above the GE junction suspicious for       Barrett's esophagus. No nodularity. No esophagitis. This was biopsied       with a cold forceps for histology. Estimated blood loss was minimal.      A small hiatal hernia was present. patchy erythema and some "mosaicism"       of the gastric mucosa. No ulcer or infiltrating process seen. Gastric       mucosa also biopsied.      The duodenal bulb and second portion of the duodenum were normal. Impression:               - Mucosal  changes in the esophagus - suspicious for                            Barrett's esophagus. Biopsied.                           - Small hiatal hernia. Abnormal gastric                            mucosa?"biopsied                           - Normal duodenal bulb and second portion of the                            duodenum. Moderate Sedation:      Moderate (conscious) sedation was administered by the endoscopy nurse       and supervised by the endoscopist. The following parameters were       monitored: oxygen saturation, heart rate, blood pressure, respiratory       rate, EKG, adequacy of pulmonary ventilation,  and response to care.       Total physician intraservice time was 14 minutes. Recommendation:           - Patient has a contact number available for                            emergencies. The signs and symptoms of potential                            delayed complications were discussed with the                            patient. Return to normal activities tomorrow.                            Written discharge instructions were provided to the                            patient.                           - Resume previous diet.                           - Continue present medications. Continue Protonix                            40 mg daily                           - Await pathology results.                           - Repeat upper endoscopy after studies are complete                            for  surveillance.                           - Return to GI clinic (date not yet determined). Procedure Code(s):        --- Professional ---                           [id number], Esophagogastroduodenoscopy, flexible,                            transoral; with biopsy, single or multiple                           99152, Moderate sedation services provided by the                            same physician or other qualified health care                            professional performing the diagnostic or                            therapeutic service that the sedation supports,                            requiring the presence of an independent trained                            observer to assist in the monitoring of the                            patient's level of consciousness and physiological                            status; initial 15 minutes of intraservice time,                            patient age 55 years or older Diagnosis Code(s):        --- Professional ---                           K22.8, Other specified diseases of esophagus                           K44.9, Diaphragmatic hernia without  obstruction or                            gangrene                           D50.9, Iron deficiency anemia, unspecified CPT copyright 2016 American Medical Association. All rights reserved. The codes documented in this report are preliminary and upon coder review may  be revised to meet current compliance requirements. Cristopher Estimable. Kieffer Blatz, MD Norvel Richards, MD 08/31/2017 11:45:49 AM This report has been signed electronically. Number of Addenda: 0

## 2017-09-07 ENCOUNTER — Encounter (HOSPITAL_COMMUNITY): Payer: Self-pay | Admitting: Internal Medicine

## 2017-09-11 ENCOUNTER — Encounter: Payer: Self-pay | Admitting: Internal Medicine

## 2017-09-11 ENCOUNTER — Other Ambulatory Visit: Payer: Self-pay

## 2017-09-11 DIAGNOSIS — D649 Anemia, unspecified: Secondary | ICD-10-CM

## 2017-09-11 NOTE — Progress Notes (Signed)
PATIENT SCHEDULED  °

## 2017-09-17 NOTE — Progress Notes (Signed)
Dr. Gala Romney, new IDA, patient had TCS 2017. EGD recently with Barrett's with dysplasia. Do you recommend completing work up with small bowel capsule study?

## 2017-09-18 NOTE — Patient Instructions (Signed)
Called [facility] to f/u on referral. Pt has appt 11/12/17 at 1:30pm, on waiting list.

## 2017-09-23 NOTE — Progress Notes (Signed)
Please let patient know that RMR recommends capsule study to evaluate small bowel for reason for IDA. Please schedule Givens capsule study for IDA prior to patient going to [place]. Thanks.   He also needs to take ferrous sulfate 325mg  bid. Can buy OTC. Will need to hold 7 days before givens.  Repeat CBC, ferritin in 6 weeks.

## 2017-09-24 ENCOUNTER — Other Ambulatory Visit: Payer: Self-pay

## 2017-09-24 ENCOUNTER — Other Ambulatory Visit: Payer: Self-pay | Admitting: *Deleted

## 2017-09-24 ENCOUNTER — Telehealth: Payer: Self-pay | Admitting: Internal Medicine

## 2017-09-24 ENCOUNTER — Encounter: Payer: Self-pay | Admitting: *Deleted

## 2017-09-24 DIAGNOSIS — D649 Anemia, unspecified: Secondary | ICD-10-CM

## 2017-09-24 DIAGNOSIS — D509 Iron deficiency anemia, unspecified: Secondary | ICD-10-CM

## 2017-09-24 MED ORDER — PANTOPRAZOLE SODIUM 40 MG PO TBEC: 40.0000 mg | DELAYED_RELEASE_TABLET | Freq: Every day | ORAL | 3 refills | Status: DC

## 2017-09-24 NOTE — Addendum Note (Signed)
Addended by: Annitta Needs on: 09/24/2017 03:11 PM   Modules accepted: Orders

## 2017-09-24 NOTE — Telephone Encounter (Signed)
Done

## 2017-09-24 NOTE — Telephone Encounter (Signed)
Routing message to [facility] RX refill

## 2017-09-24 NOTE — Telephone Encounter (Signed)
Pt called to say that Dr Marcial Pacas had been the one prescribing him Protonix and he is out of refills. He is asking if RMR could write him a prescription of Protonix now and send it to [facility]. Patient has an upcoming OV with [place].

## 2017-10-16 ENCOUNTER — Encounter: Payer: Self-pay | Admitting: Internal Medicine

## 2017-10-16 ENCOUNTER — Ambulatory Visit: Payer: Medicare HMO | Admitting: Internal Medicine

## 2017-10-16 VITALS — BP 135/82 | HR 96 | Temp 96.9°F | Ht 66.0 in | Wt 179.4 lb

## 2017-10-16 DIAGNOSIS — D508 Other iron deficiency anemias: Secondary | ICD-10-CM

## 2017-10-16 DIAGNOSIS — K219 Gastro-esophageal reflux disease without esophagitis: Secondary | ICD-10-CM | POA: Diagnosis not present

## 2017-10-16 DIAGNOSIS — K22710 Barrett's esophagus with low grade dysplasia: Secondary | ICD-10-CM | POA: Diagnosis not present

## 2017-10-16 NOTE — Patient Instructions (Signed)
Keep appointment with [facility] regarding Barrett's  Keep appointment for capsule on 10/26/17  Continue Protonix 40 mg daily (disp #90 with 3 refills) -one daily  Continue pepsid at bedtime  GERD information  Office visit here in 3 months

## 2017-10-16 NOTE — Progress Notes (Signed)
Primary Care Physician:  Asencion Noble, MD Primary Gastroenterologist:  Dr. Gala Romney  Pre-Procedure History & Physical: HPI:  Christian Woodard is a 71 y.o. male here for follow-up iron deficiency anemia and Barrett's esophagus with low-grade dysplasia.  Barrett's esophagus recently diagnosed with low-grade dysplasia (she is confirmed by 2 expert pathologist) 6. Reflux symptoms well controlled on Protonix 40 mg daily. His pulmonologist added Pepcid at bedtime. Recent negative EGD and colonoscopy otherwise. Patient slated for capsule study on November 30. He is being referred to see Dr. Adria Devon down at [facility] to have his Barrett's ablated. That appointment is coming up. He has not had any melena, hematochezia or hematemesis.  Hemoglobin a month ago 9.2. Ferritin 11.  Past Medical History:  Diagnosis Date  . Arthritis   . Diabetes mellitus   . Hypercholesteremia     Past Surgical History:  Procedure Laterality Date  . BACK SURGERY  2000  . CHOLECYSTECTOMY    . COLONOSCOPY N/A 09/22/2016   Performed by Daneil Dolin, MD at [place]  . COLONOSCOPY N/A 09/15/2011   Performed by Daneil Dolin, MD at [place]  . ESOPHAGOGASTRODUODENOSCOPY (EGD) N/A 08/31/2017   Performed by Daneil Dolin, MD at [place]  . HERNIA REPAIR     as a child  . left cornea transplant Left   . Right cornea transplant    . TONSILLECTOMY      Prior to Admission medications   Medication Sig Start Date End Date Taking? Authorizing Provider  acetaminophen (TYLENOL) 500 MG tablet Take 1,000 mg by mouth every 6 (six) hours as needed for moderate pain or headache.   Yes [provider]  aspirin 81 MG tablet Take 81 mg by mouth daily.     Yes [provider]  atorvastatin (LIPITOR) 10 MG tablet Take 10 mg by mouth at bedtime.     Yes [provider]  famotidine (PEPCID) 20 MG tablet Take 20 mg by mouth at bedtime.   Yes [provider]  ferrous sulfate 325 (65 FE)  MG tablet Take 325 mg 2 (two) times daily by mouth.   Yes [provider]  glipiZIDE (GLUCOTROL XL) 5 MG 24 hr tablet Take 5 mg by mouth daily with breakfast.   Yes [provider]  LANTUS SOLOSTAR 100 UNIT/ML Solostar Pen Inject 14 Units into the skin daily. At bedtime 08/23/16  Yes [provider]  losartan (COZAAR) 50 MG tablet Take 50 mg by mouth daily.     Yes [provider]  metFORMIN (GLUCOPHAGE) 1000 MG tablet Take 1,000 mg by mouth 2 (two) times daily. 07/22/16  Yes [provider]  pantoprazole (PROTONIX) 40 MG tablet Take 1 tablet (40 mg total) by mouth daily. Take 30-60 mins before breakfast. 09/24/17  Yes Annitta Needs, NP  pioglitazone (ACTOS) 45 MG tablet Take 45 mg by mouth daily.    Yes [provider]    Allergies as of 10/16/2017  . (No Known Allergies)    Family History  Problem Relation Age of Onset  . Heart disease Mother   . Heart disease Father   . Emphysema Father        smoked  . Lung cancer Father        smoked  . Colon cancer Neg Hx     Social History   Socioeconomic History  . Marital status: Married    Spouse name: Not on file  . Number  of children: Not on file  . Years of education: Not on file  . Highest education level: Not on file  Social Needs  . Financial resource strain: Not on file  . Food insecurity - worry: Not on file  . Food insecurity - inability: Not on file  . Transportation needs - medical: Not on file  . Transportation needs - non-medical: Not on file  Occupational History  . Not on file  Tobacco Use  . Smoking status: Former Smoker    Packs/day: 2.00    Years: 36.00    Pack years: 72.00    Last attempt to quit: 11/27/1998    Years since quitting: 18.8  . Smokeless tobacco: Never Used  Substance and Sexual Activity  . Alcohol use: No  . Drug use: No  . Sexual activity: Not on file  Other Topics Concern  . Not on file  Social History Narrative  . Not on file     Review of Systems: See HPI, otherwise negative ROS  Physical Exam: BP 135/82   Pulse 96   Temp (!) 96.9 F (36.1 C) (Oral)   Ht 5\' 6"  (1.676 m)   Wt 179 lb 6.4 oz (81.4 kg)   BMI 28.96 kg/m  General:   Alert,   pleasant and cooperative in NAD;  Somewhat pale. Abdomen soft and nontender.  Impression/Recommendations:  Pleasant 71 year old gentleman with GERD now complicated by Barrett's esophagus with low-grade dysplasia. Discussed need for RFA ablation with his wife today. They're agreeable to going down and seeing in Dr. Adria Devon for further management.  Iron deficiency anemia -unexplained. Negative EGD and colonoscopy recently. Needs capsule study of the small bowel planned for November 30.  Office visit back here in 3 months. Further recommendations to follow.  Capsule study is unrevealing as the cause of iron deficiency anemia, I would advocate another colonoscopy in the near future.       Notice: This dictation was prepared with Dragon dictation along with smaller phrase technology. Any transcriptional errors that result from this process are unintentional and may not be corrected upon review.

## 2017-10-23 DIAGNOSIS — Z23 Encounter for immunization: Secondary | ICD-10-CM | POA: Diagnosis not present

## 2017-10-26 ENCOUNTER — Encounter (HOSPITAL_COMMUNITY)
Admission: RE | Disposition: A | Discharge status: Home / Self Care | Payer: Self-pay | Source: Ambulatory Visit | Attending: Internal Medicine

## 2017-10-26 ENCOUNTER — Ambulatory Visit (HOSPITAL_COMMUNITY)
Admission: RE | Admit: 2017-10-26 | Discharge: 2017-10-26 | Disposition: A | Discharge status: Home / Self Care | Payer: Medicare HMO | Source: Ambulatory Visit | Attending: Internal Medicine | Admitting: Internal Medicine

## 2017-10-26 DIAGNOSIS — D508 Other iron deficiency anemias: Secondary | ICD-10-CM | POA: Diagnosis not present

## 2017-10-26 DIAGNOSIS — D649 Anemia, unspecified: Secondary | ICD-10-CM

## 2017-10-26 DIAGNOSIS — D509 Iron deficiency anemia, unspecified: Secondary | ICD-10-CM | POA: Diagnosis not present

## 2017-10-26 DIAGNOSIS — Z8719 Personal history of other diseases of the digestive system: Secondary | ICD-10-CM | POA: Diagnosis not present

## 2017-10-26 HISTORY — PX: GIVENS CAPSULE STUDY: SHX5432

## 2017-10-26 SURGERY — IMAGING PROCEDURE, GI TRACT, INTRALUMINAL, VIA CAPSULE

## 2017-10-29 ENCOUNTER — Encounter (HOSPITAL_COMMUNITY): Payer: Self-pay | Admitting: Internal Medicine

## 2017-11-02 ENCOUNTER — Telehealth: Payer: Self-pay | Admitting: Gastroenterology

## 2017-11-02 NOTE — Telephone Encounter (Signed)
After review of capsule study, there are several areas of the small bowel of uncertain significance that are concerning. The procedure note has full details. Patient is aware that this needs further evaluation.   1. Is it possible to have Dr. Adria Devon at [facility] review the capsule images ON A CD THAT WE SEND when patient goes on 12/17? I understand if this is not possible, as this is for Barrett's. We need to find out what the options are.  2. If Dr. Adria Devon is not able to review, then we need to go ahead and pursue a CT enterography here locally as soon as possible.   3. Labs will be completed week of Dec 10th. I am not sure who they will be routed to. I have recommended referral to hematology for further management of IDA, but he would like to wait and see the results of labs prior to doing this.

## 2017-11-02 NOTE — Procedures (Signed)
Small Bowel Givens Capsule Study Procedure date:  10/26/17  Referring Provider:  Dr. Gala Romney  PCP:  Dr. Asencion Noble, MD  Indication for procedure:   71 year old male with history of IDA and Barrett's esophagus with low-grade dysplasia, referred to [facility] for ablation therapy. No overt GI bleeding. Persistent anemia with last Hgb 9.2, ferritin 11. Colonoscopy last year unrevealing for source of IDA. Capsule study now indicated to conclude evaluation.   Findings:   Capsule study complete to cecum. No obvious mass or tumor noted. Suboptimal prep, with debris noted that could obscure potential sources of bleeding but none obvious. Noted on several images in small bowel (starting at 00:39:58 approximately 25% through small bowel) and also images 00:42:49, 01:33:10 through 01:32:27 were localized clustered areas of pale/white "blunted-appearing" abnormal small bowel mucosa/villi, unclear if benign variant; however, when compared to atlas reference pictures in the G[facility]ens program, these areas looked similar to small bowel lymphoma in distribution and appearance.   First Gastric image:  00:00:16 First Duodenal image: 00:06:03 First Cecal image: 02:26:10 Gastric Passage time: 0h 92m (study began in the stomach, no esophageal images available) Small Bowel Passage time:  2h 93m  Summary & Recommendations: Small bowel abnormality noted on several images of unclear significance. No mass or obvious source for bleeding. There is concern for these abnormal areas, as the images appear similar to a lymphoma presentation when compared to [employer]. The Givens images will need to be placed on a CD and sent to Dr. Adria Devon at [facility], as he will be seeing patient in an upcoming appointment for Barrett's ablation. We will update the referral to include reviewing capsule study images as well, if at all possible. As of note, he has not had any recent abdominal imaging. I discussed with Hem/Onc. We will refer him due to  IDA, as he will need iron infusions likely. Per Hem/Onc, abdominal imaging would be helpful as well in this scenario. I also discussed with Dr. Thornton Papas a CT abd/pelvis vs CTE. CTE would be best choice in this case and will pick up on lymphadenopathy as well.   1. Keep appt with [facility].  2. Will request that [facility] also review capsule study images, and we will update referral accordingly.  3. Appt at [facility] is 12/17. If a different or postponed appointment is needed to address the capsule imaging specifically, then I discussed with patient we would proceed with CTE here instead of prolonging that evaluation. If [facility] Cola is able to review images while patient is also seen for Barrett's, then we will hold on any imaging until [facility] can review.  4. Recommend referral to Hematology due to [place]. Discussed with patient, who desires to wait until blood work is completed next week.    I discussed the abnormal findings on capsule study with patient and the algorithm for how we would pursue further testing. He understands that we will try and arrange for [facility] to review at his appointment that is upcoming in less than 2 weeks; if this is not possible, we will go ahead and pursue a CTE locally. He will consider hematology referral. We will touch base with patient next week regarding final plan for small bowel evaluation.  Annitta Needs, PhD, ANP-BC [facility]

## 2017-11-07 NOTE — Telephone Encounter (Signed)
Wonderful. 

## 2017-11-07 NOTE — Telephone Encounter (Signed)
Called spoke with Alyse Low (Dr. Samuel Jester nurse) and she reports to send the CD with patient and either him or one of his PA's can review when patient is seen. Have patient give the CD to the receptionists when he arrives.   I called Melanie (at endo) and patient can pick CD tomorrow. I called patient and made aware and he will do so. Patient also aware to give CD to receptionists once he checks in.

## 2017-11-08 ENCOUNTER — Other Ambulatory Visit: Payer: Self-pay

## 2017-11-08 ENCOUNTER — Telehealth: Payer: Self-pay | Admitting: Internal Medicine

## 2017-11-08 DIAGNOSIS — D649 Anemia, unspecified: Secondary | ICD-10-CM | POA: Diagnosis not present

## 2017-11-08 DIAGNOSIS — D508 Other iron deficiency anemias: Secondary | ICD-10-CM | POA: Diagnosis not present

## 2017-11-08 DIAGNOSIS — E1149 Type 2 diabetes mellitus with other diabetic neurological complication: Secondary | ICD-10-CM | POA: Diagnosis not present

## 2017-11-08 DIAGNOSIS — J449 Chronic obstructive pulmonary disease, unspecified: Secondary | ICD-10-CM | POA: Diagnosis not present

## 2017-11-08 DIAGNOSIS — Z79899 Other long term (current) drug therapy: Secondary | ICD-10-CM | POA: Diagnosis not present

## 2017-11-08 DIAGNOSIS — N529 Male erectile dysfunction, unspecified: Secondary | ICD-10-CM | POA: Diagnosis not present

## 2017-11-08 NOTE — Telephone Encounter (Signed)
Vaughan Basta from [place] said patient was there to have labs done, but doesn't have the lab order. Please fall order to fax [phone]

## 2017-11-08 NOTE — Telephone Encounter (Signed)
Lab orders were faxed as directed

## 2017-11-09 LAB — CBC WITH DIFFERENTIAL/PLATELET
BASOS ABS: 100 {cells}/uL (ref 0–200)
Basophils Relative: 0.8 %
EOS ABS: 100 {cells}/uL (ref 15–500)
EOS PCT: 0.8 %
HEMATOCRIT: 36.5 % — AB (ref 38.5–50.0)
Hemoglobin: 11.1 g/dL — ABNORMAL LOW (ref 13.2–17.1)
LYMPHS ABS: 613 {cells}/uL — AB (ref 850–3900)
MCH: 23.4 pg — AB (ref 27.0–33.0)
MCHC: 30.4 g/dL — AB (ref 32.0–36.0)
MCV: 76.8 fL — ABNORMAL LOW (ref 80.0–100.0)
MPV: 11.1 fL (ref 7.5–12.5)
Monocytes Relative: 8.7 %
NEUTROS PCT: 84.8 %
Neutro Abs: 10600 cells/uL — ABNORMAL HIGH (ref 1500–7800)
Platelets: 330 10*3/uL (ref 140–400)
RBC: 4.75 10*6/uL (ref 4.20–5.80)
RDW: 21.4 % — AB (ref 11.0–15.0)
Total Lymphocyte: 4.9 %
WBC mixed population: 1088 cells/uL — ABNORMAL HIGH (ref 200–950)
WBC: 12.5 10*3/uL — ABNORMAL HIGH (ref 3.8–10.8)

## 2017-11-09 LAB — FERRITIN: FERRITIN: 20 ng/mL (ref 20–380)

## 2017-11-12 DIAGNOSIS — K449 Diaphragmatic hernia without obstruction or gangrene: Secondary | ICD-10-CM | POA: Diagnosis not present

## 2017-11-12 DIAGNOSIS — E119 Type 2 diabetes mellitus without complications: Secondary | ICD-10-CM | POA: Diagnosis not present

## 2017-11-12 DIAGNOSIS — E785 Hyperlipidemia, unspecified: Secondary | ICD-10-CM | POA: Diagnosis not present

## 2017-11-12 DIAGNOSIS — K22710 Barrett's esophagus with low grade dysplasia: Secondary | ICD-10-CM | POA: Diagnosis not present

## 2017-11-12 DIAGNOSIS — R0600 Dyspnea, unspecified: Secondary | ICD-10-CM | POA: Diagnosis not present

## 2017-11-12 DIAGNOSIS — D508 Other iron deficiency anemias: Secondary | ICD-10-CM | POA: Diagnosis not present

## 2017-11-12 DIAGNOSIS — R1084 Generalized abdominal pain: Secondary | ICD-10-CM | POA: Diagnosis not present

## 2017-11-12 DIAGNOSIS — K5730 Diverticulosis of large intestine without perforation or abscess without bleeding: Secondary | ICD-10-CM | POA: Diagnosis not present

## 2017-11-12 DIAGNOSIS — R933 Abnormal findings on diagnostic imaging of other parts of digestive tract: Secondary | ICD-10-CM | POA: Diagnosis not present

## 2017-11-13 DIAGNOSIS — E785 Hyperlipidemia, unspecified: Secondary | ICD-10-CM | POA: Diagnosis not present

## 2017-11-13 DIAGNOSIS — E1129 Type 2 diabetes mellitus with other diabetic kidney complication: Secondary | ICD-10-CM | POA: Diagnosis not present

## 2017-11-13 DIAGNOSIS — D509 Iron deficiency anemia, unspecified: Secondary | ICD-10-CM | POA: Diagnosis not present

## 2017-11-13 DIAGNOSIS — Z794 Long term (current) use of insulin: Secondary | ICD-10-CM | POA: Diagnosis not present

## 2017-11-18 NOTE — Progress Notes (Signed)
Improved H/H, and ferritin. Continue oral iron twice daily. Appears to be responding to oral iron.   See if he had CT chest and CTE abd at [facility] per their OV note. If so, get a copy.  Patient needs to continue f/u with them as planned.

## 2017-11-22 DIAGNOSIS — R935 Abnormal findings on diagnostic imaging of other abdominal regions, including retroperitoneum: Secondary | ICD-10-CM | POA: Diagnosis not present

## 2017-11-22 DIAGNOSIS — Q5310 Unspecified undescended testicle, unilateral: Secondary | ICD-10-CM | POA: Diagnosis not present

## 2017-11-22 DIAGNOSIS — D508 Other iron deficiency anemias: Secondary | ICD-10-CM | POA: Diagnosis not present

## 2017-11-22 DIAGNOSIS — R599 Enlarged lymph nodes, unspecified: Secondary | ICD-10-CM | POA: Diagnosis not present

## 2017-11-22 DIAGNOSIS — J439 Emphysema, unspecified: Secondary | ICD-10-CM | POA: Diagnosis not present

## 2017-12-04 ENCOUNTER — Encounter: Payer: Self-pay | Admitting: Internal Medicine

## 2017-12-13 DIAGNOSIS — Z9049 Acquired absence of other specified parts of digestive tract: Secondary | ICD-10-CM | POA: Diagnosis not present

## 2017-12-13 DIAGNOSIS — Z947 Corneal transplant status: Secondary | ICD-10-CM | POA: Diagnosis not present

## 2017-12-13 DIAGNOSIS — E119 Type 2 diabetes mellitus without complications: Secondary | ICD-10-CM | POA: Diagnosis not present

## 2017-12-13 DIAGNOSIS — K22710 Barrett's esophagus with low grade dysplasia: Secondary | ICD-10-CM | POA: Diagnosis not present

## 2017-12-13 DIAGNOSIS — Z7982 Long term (current) use of aspirin: Secondary | ICD-10-CM | POA: Diagnosis not present

## 2017-12-13 DIAGNOSIS — K2270 Barrett's esophagus without dysplasia: Secondary | ICD-10-CM | POA: Diagnosis not present

## 2017-12-13 DIAGNOSIS — M1990 Unspecified osteoarthritis, unspecified site: Secondary | ICD-10-CM | POA: Diagnosis not present

## 2017-12-13 DIAGNOSIS — Z79899 Other long term (current) drug therapy: Secondary | ICD-10-CM | POA: Diagnosis not present

## 2017-12-13 DIAGNOSIS — D509 Iron deficiency anemia, unspecified: Secondary | ICD-10-CM | POA: Diagnosis not present

## 2017-12-13 DIAGNOSIS — R933 Abnormal findings on diagnostic imaging of other parts of digestive tract: Secondary | ICD-10-CM | POA: Diagnosis not present

## 2017-12-13 DIAGNOSIS — E785 Hyperlipidemia, unspecified: Secondary | ICD-10-CM | POA: Diagnosis not present

## 2018-01-01 ENCOUNTER — Telehealth: Payer: Self-pay

## 2018-01-01 NOTE — Telephone Encounter (Signed)
LMOM, waiting on a return call. Pt needs to be advised that he had an abnormal small bowel seen on his report and will be further evaluated by [facility]. Per RMR.

## 2018-01-01 NOTE — Telephone Encounter (Signed)
Spoke with pt, pt is already following [employer]. Pt had an appointment last week and pts next apt is 02/11/18

## 2018-01-05 NOTE — Progress Notes (Signed)
Reviewed records in [place] done at [facility]. He has had CTE and CT chest. Appears to be scheduled for push enteroscopy to evaluate abnormal small bowel. Will continue to follow progress.

## 2018-01-07 ENCOUNTER — Encounter: Payer: Self-pay | Admitting: Gastroenterology

## 2018-02-11 DIAGNOSIS — Z79899 Other long term (current) drug therapy: Secondary | ICD-10-CM | POA: Diagnosis not present

## 2018-02-11 DIAGNOSIS — D500 Iron deficiency anemia secondary to blood loss (chronic): Secondary | ICD-10-CM | POA: Diagnosis not present

## 2018-02-11 DIAGNOSIS — Z7982 Long term (current) use of aspirin: Secondary | ICD-10-CM | POA: Diagnosis not present

## 2018-02-11 DIAGNOSIS — R591 Generalized enlarged lymph nodes: Secondary | ICD-10-CM | POA: Diagnosis not present

## 2018-02-11 DIAGNOSIS — R590 Localized enlarged lymph nodes: Secondary | ICD-10-CM | POA: Diagnosis not present

## 2018-03-12 DIAGNOSIS — E1129 Type 2 diabetes mellitus with other diabetic kidney complication: Secondary | ICD-10-CM | POA: Diagnosis not present

## 2018-03-19 DIAGNOSIS — Z6831 Body mass index (BMI) 31.0-31.9, adult: Secondary | ICD-10-CM | POA: Diagnosis not present

## 2018-03-19 DIAGNOSIS — D509 Iron deficiency anemia, unspecified: Secondary | ICD-10-CM | POA: Diagnosis not present

## 2018-03-19 DIAGNOSIS — E1129 Type 2 diabetes mellitus with other diabetic kidney complication: Secondary | ICD-10-CM | POA: Diagnosis not present

## 2018-04-22 ENCOUNTER — Telehealth: Payer: Self-pay | Admitting: Gastroenterology

## 2018-04-22 NOTE — Telephone Encounter (Signed)
Patient never responded to patient email. Please call patient. See copy and pasted email I send him on 01/07/18.  "Hi Christian Woodard. We have been getting updates from [facility] periodically. I did receive copy of your CT scan of your small bowel. I note they are further investigating your abnormal small bowel seen on CT. We will continue to wait on endoscopy reports.   I wanted to make sure you were aware of another findings seen on CT. It said your right testicle is located within the inguinal canal. Have you ever seen an urologist for this?"  1. Please let patient know that his CT done at [facility] showed undescended right testicle and he should see a urologist if he has never done so as this can increase risk of testicular cancer.

## 2018-04-23 NOTE — Telephone Encounter (Signed)
Lmom, waiting on a return call.  

## 2018-04-24 NOTE — Telephone Encounter (Signed)
Noted  

## 2018-04-24 NOTE — Telephone Encounter (Signed)
Spoke with pt and he has seen a urologist previously. Pt is going to call the urologist and see how often he should be following them given the ct results.

## 2018-05-14 DIAGNOSIS — R590 Localized enlarged lymph nodes: Secondary | ICD-10-CM | POA: Diagnosis not present

## 2018-07-16 DIAGNOSIS — E1129 Type 2 diabetes mellitus with other diabetic kidney complication: Secondary | ICD-10-CM | POA: Diagnosis not present

## 2018-07-23 DIAGNOSIS — I700 Atherosclerosis of aorta: Secondary | ICD-10-CM | POA: Diagnosis not present

## 2018-07-23 DIAGNOSIS — E1129 Type 2 diabetes mellitus with other diabetic kidney complication: Secondary | ICD-10-CM | POA: Diagnosis not present

## 2018-09-06 DIAGNOSIS — S93601A Unspecified sprain of right foot, initial encounter: Secondary | ICD-10-CM | POA: Diagnosis not present

## 2018-10-21 ENCOUNTER — Other Ambulatory Visit: Payer: Self-pay | Admitting: Gastroenterology

## 2018-10-29 DIAGNOSIS — I700 Atherosclerosis of aorta: Secondary | ICD-10-CM | POA: Diagnosis not present

## 2018-10-29 DIAGNOSIS — E1129 Type 2 diabetes mellitus with other diabetic kidney complication: Secondary | ICD-10-CM | POA: Diagnosis not present

## 2018-10-29 DIAGNOSIS — D649 Anemia, unspecified: Secondary | ICD-10-CM | POA: Diagnosis not present

## 2018-10-29 DIAGNOSIS — Z79899 Other long term (current) drug therapy: Secondary | ICD-10-CM | POA: Diagnosis not present

## 2018-11-04 DIAGNOSIS — H524 Presbyopia: Secondary | ICD-10-CM | POA: Diagnosis not present

## 2018-11-05 DIAGNOSIS — E1129 Type 2 diabetes mellitus with other diabetic kidney complication: Secondary | ICD-10-CM | POA: Diagnosis not present

## 2018-11-05 DIAGNOSIS — E785 Hyperlipidemia, unspecified: Secondary | ICD-10-CM | POA: Diagnosis not present

## 2018-11-05 DIAGNOSIS — I700 Atherosclerosis of aorta: Secondary | ICD-10-CM | POA: Diagnosis not present

## 2018-12-09 DIAGNOSIS — Z947 Corneal transplant status: Secondary | ICD-10-CM | POA: Diagnosis not present

## 2018-12-09 DIAGNOSIS — K2270 Barrett's esophagus without dysplasia: Secondary | ICD-10-CM | POA: Diagnosis not present

## 2018-12-09 DIAGNOSIS — Z7982 Long term (current) use of aspirin: Secondary | ICD-10-CM | POA: Diagnosis not present

## 2018-12-09 DIAGNOSIS — K449 Diaphragmatic hernia without obstruction or gangrene: Secondary | ICD-10-CM | POA: Diagnosis not present

## 2018-12-09 DIAGNOSIS — E785 Hyperlipidemia, unspecified: Secondary | ICD-10-CM | POA: Diagnosis not present

## 2018-12-09 DIAGNOSIS — K219 Gastro-esophageal reflux disease without esophagitis: Secondary | ICD-10-CM | POA: Diagnosis not present

## 2018-12-09 DIAGNOSIS — E119 Type 2 diabetes mellitus without complications: Secondary | ICD-10-CM | POA: Diagnosis not present

## 2018-12-09 DIAGNOSIS — Z9049 Acquired absence of other specified parts of digestive tract: Secondary | ICD-10-CM | POA: Diagnosis not present

## 2018-12-09 DIAGNOSIS — I10 Essential (primary) hypertension: Secondary | ICD-10-CM | POA: Diagnosis not present

## 2018-12-30 DIAGNOSIS — Z0100 Encounter for examination of eyes and vision without abnormal findings: Secondary | ICD-10-CM | POA: Diagnosis not present

## 2019-02-17 DIAGNOSIS — H16141 Punctate keratitis, right eye: Secondary | ICD-10-CM | POA: Diagnosis not present

## 2019-02-17 DIAGNOSIS — H10501 Unspecified blepharoconjunctivitis, right eye: Secondary | ICD-10-CM | POA: Diagnosis not present

## 2019-02-28 DIAGNOSIS — E1129 Type 2 diabetes mellitus with other diabetic kidney complication: Secondary | ICD-10-CM | POA: Diagnosis not present

## 2019-03-07 DIAGNOSIS — I700 Atherosclerosis of aorta: Secondary | ICD-10-CM | POA: Diagnosis not present

## 2019-03-07 DIAGNOSIS — K2270 Barrett's esophagus without dysplasia: Secondary | ICD-10-CM | POA: Diagnosis not present

## 2019-03-07 DIAGNOSIS — E1129 Type 2 diabetes mellitus with other diabetic kidney complication: Secondary | ICD-10-CM | POA: Diagnosis not present

## 2019-03-07 DIAGNOSIS — R0602 Shortness of breath: Secondary | ICD-10-CM | POA: Diagnosis not present

## 2019-06-30 DIAGNOSIS — E1129 Type 2 diabetes mellitus with other diabetic kidney complication: Secondary | ICD-10-CM | POA: Diagnosis not present

## 2019-07-04 DIAGNOSIS — E1129 Type 2 diabetes mellitus with other diabetic kidney complication: Secondary | ICD-10-CM | POA: Diagnosis not present

## 2019-07-04 DIAGNOSIS — R0602 Shortness of breath: Secondary | ICD-10-CM | POA: Diagnosis not present

## 2019-09-11 ENCOUNTER — Other Ambulatory Visit: Payer: Self-pay

## 2019-09-11 DIAGNOSIS — Z20828 Contact with and (suspected) exposure to other viral communicable diseases: Secondary | ICD-10-CM | POA: Diagnosis not present

## 2019-09-11 DIAGNOSIS — Z20822 Contact with and (suspected) exposure to covid-19: Secondary | ICD-10-CM

## 2019-09-13 LAB — NOVEL CORONAVIRUS, NAA: SARS-CoV-2, NAA: NOT DETECTED

## 2019-09-22 ENCOUNTER — Other Ambulatory Visit: Payer: Self-pay | Admitting: Nurse Practitioner

## 2019-09-22 DIAGNOSIS — J209 Acute bronchitis, unspecified: Secondary | ICD-10-CM | POA: Diagnosis not present

## 2019-09-23 ENCOUNTER — Other Ambulatory Visit: Payer: Self-pay

## 2019-09-23 DIAGNOSIS — Z20822 Contact with and (suspected) exposure to covid-19: Secondary | ICD-10-CM

## 2019-09-24 LAB — NOVEL CORONAVIRUS, NAA: SARS-CoV-2, NAA: DETECTED — AB

## 2019-10-03 ENCOUNTER — Emergency Department (HOSPITAL_COMMUNITY): Payer: Medicare HMO

## 2019-10-03 ENCOUNTER — Encounter (HOSPITAL_COMMUNITY): Payer: Self-pay

## 2019-10-03 ENCOUNTER — Inpatient Hospital Stay (HOSPITAL_COMMUNITY)
Admission: EM | Admit: 2019-10-03 | Discharge: 2019-10-07 | DRG: 177 | Disposition: A | Discharge status: Home Health | Payer: Medicare HMO | Attending: Internal Medicine | Admitting: Internal Medicine

## 2019-10-03 ENCOUNTER — Other Ambulatory Visit: Payer: Self-pay

## 2019-10-03 DIAGNOSIS — E119 Type 2 diabetes mellitus without complications: Secondary | ICD-10-CM | POA: Diagnosis present

## 2019-10-03 DIAGNOSIS — J069 Acute upper respiratory infection, unspecified: Secondary | ICD-10-CM | POA: Diagnosis present

## 2019-10-03 DIAGNOSIS — Z23 Encounter for immunization: Secondary | ICD-10-CM

## 2019-10-03 DIAGNOSIS — U071 COVID-19: Principal | ICD-10-CM | POA: Diagnosis present

## 2019-10-03 DIAGNOSIS — E7800 Pure hypercholesterolemia, unspecified: Secondary | ICD-10-CM | POA: Diagnosis not present

## 2019-10-03 DIAGNOSIS — J22 Unspecified acute lower respiratory infection: Secondary | ICD-10-CM | POA: Diagnosis present

## 2019-10-03 DIAGNOSIS — R918 Other nonspecific abnormal finding of lung field: Secondary | ICD-10-CM | POA: Diagnosis not present

## 2019-10-03 DIAGNOSIS — R0602 Shortness of breath: Secondary | ICD-10-CM | POA: Diagnosis not present

## 2019-10-03 DIAGNOSIS — Z825 Family history of asthma and other chronic lower respiratory diseases: Secondary | ICD-10-CM | POA: Diagnosis not present

## 2019-10-03 DIAGNOSIS — Z79899 Other long term (current) drug therapy: Secondary | ICD-10-CM | POA: Diagnosis not present

## 2019-10-03 DIAGNOSIS — Z8249 Family history of ischemic heart disease and other diseases of the circulatory system: Secondary | ICD-10-CM | POA: Diagnosis not present

## 2019-10-03 DIAGNOSIS — J9601 Acute respiratory failure with hypoxia: Secondary | ICD-10-CM | POA: Diagnosis not present

## 2019-10-03 DIAGNOSIS — Z87891 Personal history of nicotine dependence: Secondary | ICD-10-CM

## 2019-10-03 DIAGNOSIS — I10 Essential (primary) hypertension: Secondary | ICD-10-CM | POA: Diagnosis present

## 2019-10-03 DIAGNOSIS — Z7982 Long term (current) use of aspirin: Secondary | ICD-10-CM

## 2019-10-03 DIAGNOSIS — N179 Acute kidney failure, unspecified: Secondary | ICD-10-CM

## 2019-10-03 DIAGNOSIS — Z794 Long term (current) use of insulin: Secondary | ICD-10-CM | POA: Diagnosis not present

## 2019-10-03 DIAGNOSIS — Z801 Family history of malignant neoplasm of trachea, bronchus and lung: Secondary | ICD-10-CM | POA: Diagnosis not present

## 2019-10-03 LAB — HEPATIC FUNCTION PANEL
ALT: 18 U/L (ref 0–44)
AST: 29 U/L (ref 15–41)
Albumin: 3.7 g/dL (ref 3.5–5.0)
Alkaline Phosphatase: 70 U/L (ref 38–126)
Bilirubin, Direct: 0.4 mg/dL — ABNORMAL HIGH (ref 0.0–0.2)
Indirect Bilirubin: 1.7 mg/dL — ABNORMAL HIGH (ref 0.3–0.9)
Total Bilirubin: 2.1 mg/dL — ABNORMAL HIGH (ref 0.3–1.2)
Total Protein: 7.9 g/dL (ref 6.5–8.1)

## 2019-10-03 LAB — CBC WITH DIFFERENTIAL/PLATELET
Abs Immature Granulocytes: 0.05 10*3/uL (ref 0.00–0.07)
Basophils Absolute: 0 10*3/uL (ref 0.0–0.1)
Basophils Relative: 0 %
Eosinophils Absolute: 0 10*3/uL (ref 0.0–0.5)
Eosinophils Relative: 0 %
HCT: 45 % (ref 39.0–52.0)
Hemoglobin: 14.9 g/dL (ref 13.0–17.0)
Immature Granulocytes: 1 %
Lymphocytes Relative: 5 %
Lymphs Abs: 0.4 10*3/uL — ABNORMAL LOW (ref 0.7–4.0)
MCH: 29.3 pg (ref 26.0–34.0)
MCHC: 33.1 g/dL (ref 30.0–36.0)
MCV: 88.6 fL (ref 80.0–100.0)
Monocytes Absolute: 0.9 10*3/uL (ref 0.1–1.0)
Monocytes Relative: 11 %
Neutro Abs: 6.5 10*3/uL (ref 1.7–7.7)
Neutrophils Relative %: 83 %
Platelets: 313 10*3/uL (ref 150–400)
RBC: 5.08 MIL/uL (ref 4.22–5.81)
RDW: 13.6 % (ref 11.5–15.5)
WBC: 7.9 10*3/uL (ref 4.0–10.5)
nRBC: 0 % (ref 0.0–0.2)

## 2019-10-03 LAB — URINALYSIS, ROUTINE W REFLEX MICROSCOPIC
Bilirubin Urine: NEGATIVE
Glucose, UA: NEGATIVE mg/dL
Hgb urine dipstick: NEGATIVE
Ketones, ur: 5 mg/dL — AB
Leukocytes,Ua: NEGATIVE
Nitrite: NEGATIVE
Protein, ur: NEGATIVE mg/dL
Specific Gravity, Urine: 1.019 (ref 1.005–1.030)
pH: 5 (ref 5.0–8.0)

## 2019-10-03 LAB — GLUCOSE, CAPILLARY: Glucose-Capillary: 106 mg/dL — ABNORMAL HIGH (ref 70–99)

## 2019-10-03 LAB — BASIC METABOLIC PANEL
Anion gap: 13 (ref 5–15)
BUN: 30 mg/dL — ABNORMAL HIGH (ref 8–23)
CO2: 19 mmol/L — ABNORMAL LOW (ref 22–32)
Calcium: 8.7 mg/dL — ABNORMAL LOW (ref 8.9–10.3)
Chloride: 102 mmol/L (ref 98–111)
Creatinine, Ser: 1.64 mg/dL — ABNORMAL HIGH (ref 0.61–1.24)
GFR calc Af Amer: 47 mL/min — ABNORMAL LOW (ref >60)
GFR calc non Af Amer: 41 mL/min — ABNORMAL LOW (ref >60)
Glucose, Bld: 102 mg/dL — ABNORMAL HIGH (ref 70–99)
Potassium: 5.2 mmol/L — ABNORMAL HIGH (ref 3.5–5.1)
Sodium: 134 mmol/L — ABNORMAL LOW (ref 135–145)

## 2019-10-03 LAB — FERRITIN: Ferritin: 384 ng/mL — ABNORMAL HIGH (ref 24–336)

## 2019-10-03 LAB — D-DIMER, QUANTITATIVE: D-Dimer, Quant: 1.15 ug/mL-FEU — ABNORMAL HIGH (ref 0.00–0.50)

## 2019-10-03 LAB — C-REACTIVE PROTEIN: CRP: 7.4 mg/dL — ABNORMAL HIGH (ref <1.0)

## 2019-10-03 LAB — PROCALCITONIN: Procalcitonin: <0.1 ng/mL

## 2019-10-03 MED ORDER — ALBUTEROL SULFATE HFA 108 (90 BASE) MCG/ACT IN AERS
2.0000 | INHALATION_SPRAY | Freq: Four times a day (QID) | RESPIRATORY_TRACT | Status: DC
  Administered 2019-10-03 – 2019-10-07 (×15): 2 via RESPIRATORY_TRACT
  Filled 2019-10-03: qty 6.7

## 2019-10-03 MED ORDER — GUAIFENESIN-DM 100-10 MG/5ML PO SYRP: 10.0000 mL | ORAL_SOLUTION | ORAL | Status: DC | PRN

## 2019-10-03 MED ORDER — DEXAMETHASONE 6 MG PO TABS
6.0000 mg | ORAL_TABLET | Freq: Every day | ORAL | Status: DC
  Administered 2019-10-03 – 2019-10-07 (×5): 6 mg via ORAL
  Filled 2019-10-03 (×4): qty 1
  Filled 2019-10-03: qty 2

## 2019-10-03 MED ORDER — ONDANSETRON HCL 4 MG PO TABS: 4.0000 mg | ORAL_TABLET | Freq: Four times a day (QID) | ORAL | Status: DC | PRN

## 2019-10-03 MED ORDER — INSULIN GLARGINE 100 UNIT/ML ~~LOC~~ SOLN
20.0000 [IU] | Freq: Every day | SUBCUTANEOUS | Status: DC
  Administered 2019-10-04 – 2019-10-07 (×4): 20 [IU] via SUBCUTANEOUS
  Filled 2019-10-03 (×4): qty 0.2

## 2019-10-03 MED ORDER — FAMOTIDINE 20 MG PO TABS
20.0000 mg | ORAL_TABLET | Freq: Every day | ORAL | Status: DC
  Administered 2019-10-03 – 2019-10-06 (×4): 20 mg via ORAL
  Filled 2019-10-03 (×4): qty 1

## 2019-10-03 MED ORDER — ACETAMINOPHEN 325 MG PO TABS: 650.0000 mg | ORAL_TABLET | Freq: Four times a day (QID) | ORAL | Status: DC | PRN

## 2019-10-03 MED ORDER — ACETAMINOPHEN 500 MG PO TABS: 1000.0000 mg | ORAL_TABLET | Freq: Four times a day (QID) | ORAL | Status: DC | PRN

## 2019-10-03 MED ORDER — SODIUM CHLORIDE 0.9 % IV SOLN
200.0000 mg | Freq: Once | INTRAVENOUS | Status: AC
  Administered 2019-10-03: 23:00:00 200 mg via INTRAVENOUS
  Filled 2019-10-03: qty 40

## 2019-10-03 MED ORDER — VITAMIN C 500 MG PO TABS
500.0000 mg | ORAL_TABLET | Freq: Every day | ORAL | Status: DC
  Administered 2019-10-03 – 2019-10-07 (×5): 500 mg via ORAL
  Filled 2019-10-03 (×5): qty 1

## 2019-10-03 MED ORDER — SENNOSIDES-DOCUSATE SODIUM 8.6-50 MG PO TABS: 1.0000 | ORAL_TABLET | Freq: Every evening | ORAL | Status: DC | PRN

## 2019-10-03 MED ORDER — SODIUM CHLORIDE 0.9 % IV SOLN
INTRAVENOUS | Status: DC
  Administered 2019-10-03: 14:00:00 via INTRAVENOUS

## 2019-10-03 MED ORDER — SODIUM CHLORIDE 0.9 % IV BOLUS
500.0000 mL | Freq: Once | INTRAVENOUS | Status: AC
  Administered 2019-10-03: 500 mL via INTRAVENOUS

## 2019-10-03 MED ORDER — HYDROCOD POLST-CPM POLST ER 10-8 MG/5ML PO SUER: 5.0000 mL | Freq: Two times a day (BID) | ORAL | Status: DC | PRN

## 2019-10-03 MED ORDER — PANTOPRAZOLE SODIUM 40 MG PO TBEC
40.0000 mg | DELAYED_RELEASE_TABLET | Freq: Every day | ORAL | Status: DC
  Administered 2019-10-04 – 2019-10-07 (×4): 40 mg via ORAL
  Filled 2019-10-03 (×4): qty 1

## 2019-10-03 MED ORDER — LOSARTAN POTASSIUM 25 MG PO TABS
50.0000 mg | ORAL_TABLET | Freq: Every day | ORAL | Status: DC
  Administered 2019-10-03 – 2019-10-07 (×5): 50 mg via ORAL
  Filled 2019-10-03 (×5): qty 2

## 2019-10-03 MED ORDER — SODIUM CHLORIDE 0.9 % IV SOLN
INTRAVENOUS | Status: AC
  Administered 2019-10-04: via INTRAVENOUS

## 2019-10-03 MED ORDER — SODIUM CHLORIDE 0.9 % IV SOLN
100.0000 mg | INTRAVENOUS | Status: DC
  Administered 2019-10-04 – 2019-10-06 (×3): 100 mg via INTRAVENOUS
  Filled 2019-10-03 (×4): qty 20

## 2019-10-03 MED ORDER — ONDANSETRON HCL 4 MG/2ML IJ SOLN: 4.0000 mg | Freq: Four times a day (QID) | INTRAMUSCULAR | Status: DC | PRN

## 2019-10-03 MED ORDER — ENOXAPARIN SODIUM 40 MG/0.4ML ~~LOC~~ SOLN
40.0000 mg | SUBCUTANEOUS | Status: DC
  Administered 2019-10-03 – 2019-10-06 (×4): 40 mg via SUBCUTANEOUS
  Filled 2019-10-03 (×4): qty 0.4

## 2019-10-03 MED ORDER — ZINC SULFATE 220 (50 ZN) MG PO CAPS
220.0000 mg | ORAL_CAPSULE | Freq: Every day | ORAL | Status: DC
  Administered 2019-10-03 – 2019-10-07 (×5): 220 mg via ORAL
  Filled 2019-10-03 (×5): qty 1

## 2019-10-03 MED ORDER — ATORVASTATIN CALCIUM 10 MG PO TABS
10.0000 mg | ORAL_TABLET | Freq: Every day | ORAL | Status: DC
  Administered 2019-10-03 – 2019-10-06 (×4): 10 mg via ORAL
  Filled 2019-10-03 (×4): qty 1

## 2019-10-03 MED ORDER — ASPIRIN 81 MG PO CHEW
81.0000 mg | CHEWABLE_TABLET | Freq: Every day | ORAL | Status: DC
  Administered 2019-10-04 – 2019-10-07 (×4): 81 mg via ORAL
  Filled 2019-10-03 (×4): qty 1

## 2019-10-03 MED ORDER — INSULIN ASPART 100 UNIT/ML ~~LOC~~ SOLN
0.0000 [IU] | SUBCUTANEOUS | Status: DC
  Administered 2019-10-04 (×2): 20 [IU] via SUBCUTANEOUS
  Administered 2019-10-04: 04:00:00 4 [IU] via SUBCUTANEOUS
  Administered 2019-10-04: 7 [IU] via SUBCUTANEOUS
  Administered 2019-10-04: 09:00:00 3 [IU] via SUBCUTANEOUS
  Administered 2019-10-04: 12:00:00 15 [IU] via SUBCUTANEOUS
  Administered 2019-10-05: 09:00:00 3 [IU] via SUBCUTANEOUS
  Administered 2019-10-05: 12:00:00 15 [IU] via SUBCUTANEOUS
  Administered 2019-10-05: 23:00:00 4 [IU] via SUBCUTANEOUS
  Administered 2019-10-05: 20:00:00 15 [IU] via SUBCUTANEOUS
  Administered 2019-10-05 (×2): 11 [IU] via SUBCUTANEOUS
  Administered 2019-10-06: 03:00:00 3 [IU] via SUBCUTANEOUS

## 2019-10-03 NOTE — H&P (Signed)
History and Physical  Christian Woodard [id number] DOB: 22-Jul-1946 DOA: 10/03/2019  Referring physician: Dr Wilson Singer, ED physician PCP: Asencion Noble, MD  Outpatient Specialists:  Patient Coming From: home  Chief Complaint: sob, weak  HPI: Christian Woodard is a 73 y.o. male with a history of diabetes, hyperlipidemia, hypertension.  Patient seen for shortness of breath, cough, and weakness that started a couple days ago and gradually worsened.  He was tested 10 days ago and got results earlier this week for Covid.  He was tested due to an outbreak at his church where he goes and flulike symptoms with body aches, decreased appetite, nausea.  Nonproductive cough for the past 2 days that is worse with exertion and better with rest.  No fevers, diarrhea, or other GI complaints  Emergency Department Course: Oxygen saturation 90 to 92% and improved with nasal cannula.  Chest x-ray shows patchy infiltrates.  White count normal.  Review of Systems:   Pt denies any fevers, chills, nausea, vomiting, diarrhea, constipation, abdominal pain, palpitations, headache, vision changes, lightheadedness, dizziness, melena, rectal bleeding.  Review of systems are otherwise negative  Past Medical History:  Diagnosis Date  . Arthritis   . Diabetes mellitus   . Hypercholesteremia    Past Surgical History:  Procedure Laterality Date  . BACK SURGERY  2000  . CHOLECYSTECTOMY    . COLONOSCOPY  09/15/2011   Dr. Gala Romney: Suboptimal prep. Pancolonic diverticulosis. Tubular adenomas. Next colonoscopy 5 years.  . COLONOSCOPY N/A 09/22/2016   Procedure: COLONOSCOPY;  Surgeon: Daneil Dolin, MD;  [facility]: [facility];  Service: Endoscopy;  Laterality: N/A;  1100  . ESOPHAGOGASTRODUODENOSCOPY N/A 08/31/2017   Procedure: ESOPHAGOGASTRODUODENOSCOPY (EGD);  Surgeon: Daneil Dolin, MD;  Location: [facility];  Service: Endoscopy;  Laterality: N/A;  11:15am  . GIVENS CAPSULE STUDY N/A 10/26/2017   Procedure: GIVENS  CAPSULE STUDY;  Surgeon: Daneil Dolin, MD;  Location: [facility];  Service: Endoscopy;  Laterality: N/A;  7:30am  . HERNIA REPAIR     as a child  . left cornea transplant Left   . Right cornea transplant    . TONSILLECTOMY     Social History:  reports that he quit smoking about 20 years ago. He has a 72.00 pack-year smoking history. He has never used smokeless tobacco. He reports that he does not drink alcohol or use drugs. Patient lives at home  No Known Allergies  Family History  Problem Relation Age of Onset  . Heart disease Mother   . Heart disease Father   . Emphysema Father        smoked  . Lung cancer Father        smoked  . Colon cancer Neg Hx       Prior to Admission medications   Medication Sig Start Date End Date Taking? Authorizing Provider  acetaminophen (TYLENOL) 500 MG tablet Take 1,000 mg by mouth every 6 (six) hours as needed for moderate pain or headache.   Yes [provider]  aspirin 81 MG tablet Take 81 mg by mouth daily.     Yes [provider]  atorvastatin (LIPITOR) 10 MG tablet Take 10 mg by mouth at bedtime.     Yes [provider]  famotidine (PEPCID) 20 MG tablet Take 20 mg by mouth at bedtime.   Yes [provider]  ferrous sulfate 325 (65 FE) MG tablet Take 325 mg 2 (two) times daily by mouth.   Yes [provider]  glipiZIDE (GLUCOTROL XL) 5 MG 24 hr tablet Take 5 mg by mouth daily with breakfast.   Yes [provider]  LANTUS SOLOSTAR 100 UNIT/ML Solostar Pen Inject 14 Units into the skin daily. At bedtime 08/23/16  Yes [provider]  losartan (COZAAR) 50 MG tablet Take 50 mg by mouth daily.     Yes [provider]  metFORMIN (GLUCOPHAGE) 1000 MG tablet Take 1,000 mg by mouth 2 (two) times daily. 07/22/16  Yes [provider]  pantoprazole (PROTONIX) 40 MG tablet TAKE 1 TABLET BY MOUTH ONCE DAILY 30  TO  60  MINUTES  BEFORE  BREAKFAST 09/24/19  Yes Mahala Menghini,  PA-C  pioglitazone (ACTOS) 45 MG tablet Take 45 mg by mouth daily.    Yes [provider]    Physical Exam: BP (!) 108/53   Pulse 90   Temp 97.7 F (36.5 C) (Oral)   Resp (!) 28   Ht 5\' 6"  (1.676 m)   Wt 74.8 kg   SpO2 94%   BMI 26.63 kg/m   . General: Elderly male. Awake and alert and oriented x3. No acute cardiopulmonary distress.  Marland Kitchen HEENT: Normocephalic atraumatic.  Right and left ears normal in appearance.  Pupils equal, round, reactive to light. Extraocular muscles are intact. Sclerae anicteric and noninjected.  Moist mucosal membranes. No mucosal lesions.  . Neck: Neck supple without lymphadenopathy. No carotid bruits. No masses palpated.  . Cardiovascular: Regular rate with normal S1-S2 sounds. No murmurs, rubs, gallops auscultated. No JVD.  Marland Kitchen Respiratory: Mild rales throughout.  No accessory muscle use. . Abdomen: Soft, nontender, nondistended. Active bowel sounds. No masses or hepatosplenomegaly  . Skin: No rashes, lesions, or ulcerations.  Dry, warm to touch. 2+ dorsalis pedis and radial pulses. . Musculoskeletal: No calf or leg pain. All major joints not erythematous nontender.  No upper or lower joint deformation.  Good ROM.  No contractures  . Psychiatric: Intact judgment and insight. Pleasant and cooperative. . Neurologic: No focal neurological deficits. Strength is 5/5 and symmetric in upper and lower extremities.  Cranial nerves II through XII are grossly intact.           Labs on Admission: I have personally reviewed following labs and imaging studies  CBC: Recent Labs  Lab 10/03/19 1307  WBC 7.9  NEUTROABS 6.5  HGB 14.9  HCT 45.0  MCV 88.6  PLT [id number]   Basic Metabolic Panel: Recent Labs  Lab 10/03/19 1307  NA 134*  K 5.2*  CL 102  CO2 19*  GLUCOSE 102*  BUN 30*  CREATININE 1.64*  CALCIUM 8.7*   GFR: Estimated Creatinine Clearance: 35.7 mL/min (A) (by C-G formula based on SCr of 1.64 mg/dL (H)). Liver Function Tests: No results for  input(s): AST, ALT, ALKPHOS, BILITOT, PROT, ALBUMIN in the last 168 hours. No results for input(s): LIPASE, AMYLASE in the last 168 hours. No results for input(s): AMMONIA in the last 168 hours. Coagulation Profile: No results for input(s): INR, PROTIME in the last 168 hours. Cardiac Enzymes: No results for input(s): CKTOTAL, CKMB, CKMBINDEX, TROPONINI in the last 168 hours. BNP (last 3 results) No results for input(s): PROBNP in the last 8760 hours. HbA1C: No results for input(s): HGBA1C in the last 72 hours. CBG: No results for input(s): GLUCAP in the last 168 hours. Lipid Profile: No results for input(s): CHOL, HDL, LDLCALC, TRIG, CHOLHDL, LDLDIRECT in the last 72 hours. Thyroid Function Tests: No results for input(s): TSH, T4TOTAL, FREET4,  T3FREE, THYROIDAB in the last 72 hours. Anemia Panel: Recent Labs    10/03/19 1322  FERRITIN 384*   Urine analysis:    Component Value Date/Time   COLORURINE YELLOW 10/03/2019 1307   APPEARANCEUR HAZY (A) 10/03/2019 1307   LABSPEC 1.019 10/03/2019 1307   PHURINE 5.0 10/03/2019 1307   GLUCOSEU NEGATIVE 10/03/2019 1307   HGBUR NEGATIVE 10/03/2019 1307   BILIRUBINUR NEGATIVE 10/03/2019 1307   KETONESUR 5 (A) 10/03/2019 1307   PROTEINUR NEGATIVE 10/03/2019 1307   NITRITE NEGATIVE 10/03/2019 1307   LEUKOCYTESUR NEGATIVE 10/03/2019 1307   Sepsis Labs: @LABRCNTIP (procalcitonin:4,lacticidven:4) )No results found for this or any previous visit (from the past 240 hour(s)).   Radiological Exams on Admission: Dg Chest Portable 1 View  Result Date: 10/03/2019 CLINICAL DATA:  Patient with COVID 19. Intermittent shortness of breath. EXAM: PORTABLE CHEST 1 VIEW COMPARISON:  Chest radiograph 04/03/2017. FINDINGS: Monitoring leads overlie the patient. Stable enlarged cardiac and mediastinal contours. Aortic atherosclerosis. Interval development of mild patchy peripheral consolidative opacities. No pleural effusion or pneumothorax. Osseous structures  unremarkable. Cholecystectomy clips. IMPRESSION: New mild bilateral peripheral patchy opacities likely sequelae of atypical viral infection. Electronically Signed   By: Lovey Newcomer M.D.   On: 10/03/2019 13:15    Assessment/Plan: Principal Problem:   Acute respiratory failure with hypoxia (HCC) Active Problems:   Acute respiratory disease due to COVID-19 virus   Type 2 diabetes mellitus without complication ([place])   Essential hypertension    This patient was discussed with the ED physician, including pertinent vitals, physical exam findings, labs, and imaging.  We also discussed care given by the ED provider.  1. Acute respiratory failure with hypoxia a. Patient qualifies for full admit at [facility] for positive Covid b. Continue oxygen supplementation 2. Acute respiratory disease due to Covid Daily labs: CBC, CMP, CRP, D-dimer, ferritin, magnesium, phosphorus Discussed use of steroids: Solu-Medrol Discussed use of remdesivir.  Informed patient that this is experimental use as it is not yet FDA approved for the treatment of COVID-19.  Explained that this has been shown to improve outcomes with patients with chest x-ray findings and shortness of breath requiring oxygen supplementation.  Discussed potential adverse side effects such as elevated LFTs.  Patient agreeable to use of the medication. Proning Supplemental oxygen Zinc and vitamin C Albuterol HFA as needed Antitussives 3. Type 2 diabetes a. We will hold orals b. Sliding scale insulin  c. CBGs before meals and nightly 4. Hypertension a. Continue antihypertensive  DVT prophylaxis: Lovenox Consultants: None Code Status: Full code Family Communication: Discussed plan with wife Disposition Plan: Should be able to return home following admission   Truett Mainland, DO

## 2019-10-03 NOTE — ED Triage Notes (Signed)
Pt tested positive last Tuesday for Covid. Pt reports intermittent SOB and cough. Pt reports weakness

## 2019-10-03 NOTE — Progress Notes (Addendum)
Patient arrived to the unit, no tele orders, 4 L [place], v/s stable. Alert and oriented, and pleasant.  Patient spoke with his wife, and she had no further questions at this time.  Incentive spirometer and flutter valve provided, patient demonstrated proper use of both.   CBG checked and dinner tray ordered.

## 2019-10-03 NOTE — ED Provider Notes (Signed)
[facility] EMERGENCY DEPARTMENT Provider Note   CSN: [id number] Arrival date & time: 10/03/19  [id number]     History   Chief Complaint Chief Complaint  Patient presents with  . Shortness of Breath    HPI Christian Woodard is a 73 y.o. male.  HPI   73 year old male with dyspnea.  Patient has known Covid positive.  Test positive last week.  He has had some generalized weakness since around that time but in the last day or 2 has had increasing shortness of breath and cough.  Worse with exertion.  Denies any acute pain.  No fevers.  No GI complaints.  Past Medical History:  Diagnosis Date  . Arthritis   . Diabetes mellitus   . Hypercholesteremia     Patient Active Problem List   Diagnosis Date Noted  . Heme positive stool 08/30/2017  . Absolute anemia 06/15/2017  . Dyspnea on exertion 06/14/2017  . Abdominal bloating 09/04/2016  . Hx of adenomatous colonic polyps 09/04/2016    Past Surgical History:  Procedure Laterality Date  . BACK SURGERY  2000  . CHOLECYSTECTOMY    . COLONOSCOPY  09/15/2011   Dr. Gala Romney: Suboptimal prep. Pancolonic diverticulosis. Tubular adenomas. Next colonoscopy 5 years.  . COLONOSCOPY N/A 09/22/2016   Procedure: COLONOSCOPY;  Surgeon: Daneil Dolin, MD;  [facility];  Service: Endoscopy;  Laterality: N/A;  1100  . ESOPHAGOGASTRODUODENOSCOPY N/A 08/31/2017   Procedure: ESOPHAGOGASTRODUODENOSCOPY (EGD);  Surgeon: Daneil Dolin, MD;  [facility];  Service: Endoscopy;  Laterality: N/A;  11:15am  . GIVENS CAPSULE STUDY N/A 10/26/2017   Procedure: GIVENS CAPSULE STUDY;  Surgeon: Daneil Dolin, MD;  [facility];  Service: Endoscopy;  Laterality: N/A;  7:30am  . HERNIA REPAIR     as a child  . left cornea transplant Left   . Right cornea transplant    . TONSILLECTOMY          Home Medications    Prior to Admission medications   Medication Sig Start Date End Date Taking? Authorizing Provider  acetaminophen  (TYLENOL) 500 MG tablet Take 1,000 mg by mouth every 6 (six) hours as needed for moderate pain or headache.    [provider]  aspirin 81 MG tablet Take 81 mg by mouth daily.      [provider]  atorvastatin (LIPITOR) 10 MG tablet Take 10 mg by mouth at bedtime.      [provider]  famotidine (PEPCID) 20 MG tablet Take 20 mg by mouth at bedtime.    [provider]  ferrous sulfate 325 (65 FE) MG tablet Take 325 mg 2 (two) times daily by mouth.    [provider]  glipiZIDE (GLUCOTROL XL) 5 MG 24 hr tablet Take 5 mg by mouth daily with breakfast.    [provider]  LANTUS SOLOSTAR 100 UNIT/ML Solostar Pen Inject 14 Units into the skin daily. At bedtime 08/23/16   [provider]  losartan (COZAAR) 50 MG tablet Take 50 mg by mouth daily.      [provider]  metFORMIN (GLUCOPHAGE) 1000 MG tablet Take 1,000 mg by mouth 2 (two) times daily. 07/22/16   [provider]  pantoprazole (PROTONIX) 40 MG tablet TAKE 1 TABLET BY MOUTH ONCE DAILY 30  TO  60  MINUTES  BEFORE  BREAKFAST 09/24/19   Mahala Menghini, PA-C  pioglitazone (ACTOS) 45 MG tablet Take 45 mg by mouth daily.  [provider]    Family History Family History  Problem Relation Age of Onset  . Heart disease Mother   . Heart disease Father   . Emphysema Father        smoked  . Lung cancer Father        smoked  . Colon cancer Neg Hx     Social History Social History   Tobacco Use  . Smoking status: Former Smoker    Packs/day: 2.00    Years: 36.00    Pack years: 72.00    Quit date: 11/27/1998    Years since quitting: 20.8  . Smokeless tobacco: Never Used  Substance Use Topics  . Alcohol use: No  . Drug use: No     Allergies   Patient has no known allergies.   Review of Systems Review of Systems  All systems reviewed and negative, other than as noted in HPI.  Physical Exam Updated Vital Signs BP 109/69   Pulse 94    Temp 97.7 F (36.5 C) (Oral)   Resp (!) 22   Ht 5\' 6"  (1.676 m)   Wt 74.8 kg   SpO2 97%   BMI 26.63 kg/m   Physical Exam Vitals signs and nursing note reviewed.  Constitutional:      General: He is not in acute distress.    Appearance: He is well-developed.     Comments: Laying in bed.  Appears tired, but nontoxic.  HENT:     Head: Normocephalic and atraumatic.  Eyes:     General:        Right eye: No discharge.        Left eye: No discharge.     Conjunctiva/sclera: Conjunctivae normal.  Neck:     Musculoskeletal: Neck supple.  Cardiovascular:     Rate and Rhythm: Normal rate and regular rhythm.     Heart sounds: Normal heart sounds. No murmur. No friction rub. No gallop.   Pulmonary:     Effort: Pulmonary effort is normal. No respiratory distress.     Breath sounds: Normal breath sounds.  Abdominal:     General: There is no distension.     Palpations: Abdomen is soft.     Tenderness: There is no abdominal tenderness.  Musculoskeletal:        General: No tenderness.  Skin:    General: Skin is warm and dry.  Neurological:     Mental Status: He is alert.  Psychiatric:        Behavior: Behavior normal.        Thought Content: Thought content normal.      ED Treatments / Results  Labs (all labs ordered are listed, but only abnormal results are displayed) Labs Reviewed  CBC WITH DIFFERENTIAL/PLATELET - Abnormal; Notable for the following components:      Result Value   Lymphs Abs 0.4 (*)    All other components within normal limits  BASIC METABOLIC PANEL - Abnormal; Notable for the following components:   Sodium 134 (*)    Potassium 5.2 (*)    CO2 19 (*)    Glucose, Bld 102 (*)    BUN 30 (*)    Creatinine, Ser 1.64 (*)    Calcium 8.7 (*)    GFR calc non Af Amer 41 (*)    GFR calc Af Amer 47 (*)    All other components within normal limits  URINALYSIS, ROUTINE W REFLEX MICROSCOPIC - Abnormal; Notable for the following components:   APPearance  HAZY (*)     Ketones, ur 5 (*)    All other components within normal limits    EKG None  Radiology Dg Chest Portable 1 View  Result Date: 10/03/2019 CLINICAL DATA:  Patient with COVID 19. Intermittent shortness of breath. EXAM: PORTABLE CHEST 1 VIEW COMPARISON:  Chest radiograph 04/03/2017. FINDINGS: Monitoring leads overlie the patient. Stable enlarged cardiac and mediastinal contours. Aortic atherosclerosis. Interval development of mild patchy peripheral consolidative opacities. No pleural effusion or pneumothorax. Osseous structures unremarkable. Cholecystectomy clips. IMPRESSION: New mild bilateral peripheral patchy opacities likely sequelae of atypical viral infection. Electronically Signed   By: Lovey Newcomer M.D.   On: 10/03/2019 13:15    Procedures Procedures (including critical care time)  Medications Ordered in ED Medications  0.9 %  sodium chloride infusion ( Intravenous New Bag/Given 10/03/19 1423)  sodium chloride 0.9 % bolus 500 mL (500 mLs Intravenous New Bag/Given 10/03/19 1423)     Initial Impression / Assessment and Plan / ED Course  I have reviewed the triage vital signs and the nursing notes.  Pertinent labs & imaging results that were available during my care of the patient were reviewed by me and considered in my medical decision making (see chart for details).       73 year old male with fatigue and dyspnea.  Known Covid positive.  Chest x-ray with typical findings.  Needing 2 and half liters to keep his oxygen saturations in the low 90s at rest.   IV fluids for the AKI.Admit.   Ashley Akin was evaluated in Emergency Department on 10/03/2019 for the symptoms described in the history of present illness. He was evaluated in the context of the global COVID-19 pandemic, which necessitated consideration that the patient might be at risk for infection with the SARS-CoV-2 virus that causes COVID-19. Institutional protocols and algorithms that pertain to the evaluation of patients  at risk for COVID-19 are in a state of rapid change based on information released by regulatory bodies including the CDC and federal and state organizations. These policies and algorithms were followed during the patient's care in the ED.   Final Clinical Impressions(s) / ED Diagnoses   Final diagnoses:  COVID-19 virus infection  AKI (acute kidney injury) [facility])    ED Discharge Orders    None       Virgel Manifold, MD 10/03/19 1517

## 2019-10-04 DIAGNOSIS — E119 Type 2 diabetes mellitus without complications: Secondary | ICD-10-CM

## 2019-10-04 DIAGNOSIS — U071 COVID-19: Principal | ICD-10-CM

## 2019-10-04 LAB — CBC WITH DIFFERENTIAL/PLATELET
Abs Immature Granulocytes: 0.04 10*3/uL (ref 0.00–0.07)
Basophils Absolute: 0 10*3/uL (ref 0.0–0.1)
Basophils Relative: 0 %
Eosinophils Absolute: 0 10*3/uL (ref 0.0–0.5)
Eosinophils Relative: 0 %
HCT: 39.3 % (ref 39.0–52.0)
Hemoglobin: 12.8 g/dL — ABNORMAL LOW (ref 13.0–17.0)
Immature Granulocytes: 1 %
Lymphocytes Relative: 6 %
Lymphs Abs: 0.2 10*3/uL — ABNORMAL LOW (ref 0.7–4.0)
MCH: 28.9 pg (ref 26.0–34.0)
MCHC: 32.6 g/dL (ref 30.0–36.0)
MCV: 88.7 fL (ref 80.0–100.0)
Monocytes Absolute: 0.4 10*3/uL (ref 0.1–1.0)
Monocytes Relative: 9 %
Neutro Abs: 3.2 10*3/uL (ref 1.7–7.7)
Neutrophils Relative %: 84 %
Platelets: 276 10*3/uL (ref 150–400)
RBC: 4.43 MIL/uL (ref 4.22–5.81)
RDW: 13.5 % (ref 11.5–15.5)
WBC: 3.8 10*3/uL — ABNORMAL LOW (ref 4.0–10.5)
nRBC: 0 % (ref 0.0–0.2)

## 2019-10-04 LAB — COMPREHENSIVE METABOLIC PANEL
ALT: 17 U/L (ref 0–44)
AST: 25 U/L (ref 15–41)
Albumin: 3.1 g/dL — ABNORMAL LOW (ref 3.5–5.0)
Alkaline Phosphatase: 59 U/L (ref 38–126)
Anion gap: 15 (ref 5–15)
BUN: 28 mg/dL — ABNORMAL HIGH (ref 8–23)
CO2: 17 mmol/L — ABNORMAL LOW (ref 22–32)
Calcium: 8.2 mg/dL — ABNORMAL LOW (ref 8.9–10.3)
Chloride: 104 mmol/L (ref 98–111)
Creatinine, Ser: 1.26 mg/dL — ABNORMAL HIGH (ref 0.61–1.24)
GFR calc Af Amer: >60 mL/min (ref >60)
GFR calc non Af Amer: 56 mL/min — ABNORMAL LOW (ref >60)
Glucose, Bld: 202 mg/dL — ABNORMAL HIGH (ref 70–99)
Potassium: 3.9 mmol/L (ref 3.5–5.1)
Sodium: 136 mmol/L (ref 135–145)
Total Bilirubin: 1.5 mg/dL — ABNORMAL HIGH (ref 0.3–1.2)
Total Protein: 6.9 g/dL (ref 6.5–8.1)

## 2019-10-04 LAB — PHOSPHORUS: Phosphorus: 4.1 mg/dL (ref 2.5–4.6)

## 2019-10-04 LAB — C-REACTIVE PROTEIN: CRP: 4.9 mg/dL — ABNORMAL HIGH (ref <1.0)

## 2019-10-04 LAB — ABO/RH: ABO/RH(D): A POS

## 2019-10-04 LAB — D-DIMER, QUANTITATIVE: D-Dimer, Quant: 0.88 ug/mL-FEU — ABNORMAL HIGH (ref 0.00–0.50)

## 2019-10-04 LAB — MAGNESIUM: Magnesium: 1.7 mg/dL (ref 1.7–2.4)

## 2019-10-04 LAB — FERRITIN: Ferritin: 389 ng/mL — ABNORMAL HIGH (ref 24–336)

## 2019-10-04 LAB — GLUCOSE, CAPILLARY
Glucose-Capillary: 142 mg/dL — ABNORMAL HIGH (ref 70–99)
Glucose-Capillary: 194 mg/dL — ABNORMAL HIGH (ref 70–99)
Glucose-Capillary: 231 mg/dL — ABNORMAL HIGH (ref 70–99)
Glucose-Capillary: 340 mg/dL — ABNORMAL HIGH (ref 70–99)
Glucose-Capillary: 370 mg/dL — ABNORMAL HIGH (ref 70–99)

## 2019-10-04 NOTE — Evaluation (Signed)
Occupational Therapy Evaluation Patient Details Name: Christian Woodard MRN: [id number] DOB: December 05, 1945 Today's Date: 10/04/2019    History of Present Illness 73 y/o male w/ hx of DM and arthritis, back surgery. dx COVID + 11/03, presented to ED 11/06 w/ c/o SOB and cough, admitted with acute resp failure w/ hypoxia sec to COVID 19.   Clinical Impression   Pt admitted with above diagnoses, presenting with generalized weakness and decreased tolerance for BADL. PTA pt living with wife and independent with both BADL and IADL. At time of eval he is supervision- min guard with transfers and functional mobility. Pt is overall weak and has several mild LOB/becomes wobbly with mobility. PT able to stand to complete urinating as well as complete functional mobility at room level with min guard assist. Education given on IS and flutter usage. Pt able to pull 2000 on IS. On 3L [place] throughout session with sats in the 90s. At this time recommend [place], will continue to follow per POC listed below.    Follow Up Recommendations  Home health OT;Supervision/Assistance - 24 hour    Equipment Recommendations  3 in 1 bedside commode    Recommendations for Other Services       Precautions / Restrictions Precautions Precautions: Fall Restrictions Weight Bearing Restrictions: No      Mobility Bed Mobility Overal bed mobility: Needs Assistance Bed Mobility: Supine to Sit     Supine to sit: Supervision        Transfers Overall transfer level: Needs assistance Equipment used: None Transfers: Sit to/from Stand Sit to Stand: Min guard         General transfer comment: min guard for safety, some LOB posteriorly    Balance Overall balance assessment: Needs assistance Sitting-balance support: Feet unsupported     Postural control: Posterior lean Standing balance support: During functional activity Standing balance-Leahy Scale: Fair Standing balance comment: increase difficulty with weight  shifting and functional tasks                           ADL either performed or assessed with clinical judgement   ADL Overall ADL's : Needs assistance/impaired Eating/Feeding: Set up;Sitting   Grooming: Min guard;Standing   Upper Body Bathing: Set up;Sitting   Lower Body Bathing: Minimal assistance;Sit to/from stand;Sitting/lateral leans   Upper Body Dressing : Set up;Sitting   Lower Body Dressing: Minimal assistance;Sit to/from stand;Sitting/lateral leans   Toilet Transfer: Nature conservation officer;Ambulation Toilet Transfer Details (indicate cue type and reason): able to stand at toilet with min guard to urinate Toileting- Clothing Manipulation and Hygiene: Set up;Sitting/lateral lean;Sit to/from stand   Tub/ Shower Transfer: Minimal assistance;Ambulation;3 in 1 Tub/Shower Transfer Details (indicate cue type and reason): would need min A to step over lip of tub Functional mobility during ADLs: Min guard General ADL Comments: pt limited by generalized weakness and decreased activity tolerance for BADL     Vision Patient Visual Report: No change from baseline       Perception     Praxis      Pertinent Vitals/Pain Pain Assessment: No/denies pain     Hand Dominance     Extremity/Trunk Assessment Upper Extremity Assessment Upper Extremity Assessment: Generalized weakness   Lower Extremity Assessment Lower Extremity Assessment: Defer to PT evaluation       Communication Communication Communication: No difficulties   Cognition Arousal/Alertness: Awake/alert Behavior During Therapy: WFL for tasks assessed/performed Overall Cognitive Status: Within Functional Limits for tasks assessed  General Comments       Exercises     Shoulder Instructions      Home Living Family/patient expects to be discharged to:: Private residence Living Arrangements: Spouse/significant other Available Help at  Discharge: Family Type of Home: House Home Access: Ramped entrance;Stairs to enter Technical brewer of Steps: 2 Entrance Stairs-Rails: Left Home Layout: One level     Bathroom Shower/Tub: Teacher, early years/pre: Handicapped height Bathroom Accessibility: Yes How Accessible: Accessible via walker Home Equipment: None          Prior Functioning/Environment Level of Independence: Independent        Comments: driving        OT Problem List: Decreased strength;Decreased knowledge of use of DME or AE;Decreased activity tolerance;Cardiopulmonary status limiting activity;Impaired balance (sitting and/or standing)      OT Treatment/Interventions: Self-care/ADL training;Therapeutic exercise;Patient/family education;Balance training;Energy conservation;Therapeutic activities;DME and/or AE instruction    OT Goals(Current goals can be found in the care plan section) Acute Rehab OT Goals Patient Stated Goal: return to independence OT Goal Formulation: With patient Time For Goal Achievement: 10/18/19 Potential to Achieve Goals: Good  OT Frequency: Min 2X/week(would benefit from 3x)   Barriers to D/C:            Co-evaluation              AM-PAC OT "6 Clicks" Daily Activity     Outcome Measure Help from another person eating meals?: A Little Help from another person taking care of personal grooming?: A Little Help from another person toileting, which includes using toliet, bedpan, or urinal?: A Little Help from another person bathing (including washing, rinsing, drying)?: A Little Help from another person to put on and taking off regular upper body clothing?: A Little Help from another person to put on and taking off regular lower body clothing?: A Little 6 Click Score: 18   End of Session Equipment Utilized During Treatment: Gait belt;Oxygen Nurse Communication: Mobility status  Activity Tolerance: Patient tolerated treatment well Patient left: in  chair;with call bell/phone within reach  OT Visit Diagnosis: Unsteadiness on feet (R26.81);Other abnormalities of gait and mobility (R26.89);Muscle weakness (generalized) (M62.81);Other (comment)(decreased cardiopulm status)                Time: [id number] OT Time Calculation (min): 24 min Charges:  OT General Charges $OT Visit: 1 Visit OT Evaluation $OT Eval Low Complexity: 1 Low OT Treatments $Self Care/Home Management : 8-22 mins  Zenovia Jarred, MSOT, OTR/L Behavioral Health OT/ Acute Relief OT [facility] Office: [phone]   Zenovia Jarred 10/04/2019, 2:33 PM

## 2019-10-04 NOTE — Evaluation (Signed)
Physical Therapy Evaluation Patient Details Name: Christian Woodard MRN: [id number] DOB: 1945-12-11 Today's Date: 10/04/2019   History of Present Illness  73 y/o male w/ hx of DM and arthritis, back surgery. dx COVID + 11/03, presented to ED 11/06 w/ c/o SOB and cough, admitted with acute resp failure w/ hypoxia sec to COVID 19.  Clinical Impression   Pt admitted with above diagnosis. PTA states was independent and living at home with spouse, states spouse is also in hospital but not with COVID. Pt currently with functional limitations due to the deficits listed below (see PT Problem List). Pt has some balance and coordination deficits noted with mobility, he is able to complete most tasks with SBA cues and line management, able to ambulate in room approx 70ft but w/ multi loses of balance. Pt was on 3L/min via [place] and was able to maintain sats in 90s but HR increased to 150s w/ activity. Pt did not show outwardly signs of this and seemed not to notice multi alarms from monitor. With rest and cues for breathing able to return HR to low 100s, noted HR at rest was 104bpm.  Pt will benefit from skilled PT to increase their independence and safety with mobility to allow discharge to the venue listed below.       Follow Up Recommendations Home health PT    Equipment Recommendations  Other (comment)(TBD)    Recommendations for Other Services OT consult     Precautions / Restrictions Precautions Precautions: Fall Restrictions Weight Bearing Restrictions: No      Mobility  Bed Mobility Overal bed mobility: Needs Assistance Bed Mobility: Supine to Sit     Supine to sit: Supervision        Transfers Overall transfer level: Needs assistance Equipment used: None Transfers: Sit to/from Stand Sit to Stand: Supervision            Ambulation/Gait Ambulation/Gait assistance: Min guard Gait Distance (Feet): 50 Feet Assistive device: None Gait Pattern/deviations: Step-through  pattern;Staggering right;Staggering left Gait velocity: very slow, may be cause of some LOBs   General Gait Details: LOB several times with ambulation but states its d/t having to move w/ lines etc  Stairs            Wheelchair Mobility    Modified Rankin (Stroke Patients Only)       Balance Overall balance assessment: Needs assistance Sitting-balance support: Feet unsupported Sitting balance-Leahy Scale: Good   Postural control: Other (comment)(fair to fair +) Standing balance support: During functional activity Standing balance-Leahy Scale: Poor Standing balance comment: noted LOBs with ambulation needing to steady on wall/bed                             Pertinent Vitals/Pain Pain Assessment: No/denies pain    Home Living Family/patient expects to be discharged to:: Private residence Living Arrangements: Spouse/significant other(spouse also ill) Available Help at Discharge: Family(granddaughter) Type of Home: House Home Access: Ramped entrance;Stairs to enter Entrance Stairs-Rails: Left Entrance Stairs-Number of Steps: 2 Home Layout: One level        Prior Function Level of Independence: Independent               Hand Dominance        Extremity/Trunk Assessment   Upper Extremity Assessment Upper Extremity Assessment: Defer to OT evaluation    Lower Extremity Assessment Lower Extremity Assessment: Generalized weakness       Communication   Communication:  No difficulties  Cognition Arousal/Alertness: Awake/alert Behavior During Therapy: WFL for tasks assessed/performed Overall Cognitive Status: Within Functional Limits for tasks assessed                                        General Comments General comments (skin integrity, edema, etc.): Pt is on 3L/min via [place] throughout able to maintain sats in 90s but HR increased to max 150s w/ activity. HR 104bpm at rest    Exercises Other Exercises Other Exercises:  Incentive spirometer x 5 able to pull 2059ml Other Exercises: flutter valve x 5 w/ cues for completion   Assessment/Plan    PT Assessment Patient needs continued PT services  PT Problem List Decreased strength;Decreased activity tolerance;Decreased balance;Decreased mobility;Decreased coordination;Decreased safety awareness       PT Treatment Interventions Gait training;Functional mobility training;Therapeutic activities;Therapeutic exercise;Balance training;Neuromuscular re-education;Patient/family education    PT Goals (Current goals can be found in the Care Plan section)  Acute Rehab PT Goals Patient Stated Goal: to go home  PT Goal Formulation: With patient Time For Goal Achievement: 10/18/19 Potential to Achieve Goals: Good    Frequency Min 3X/week   Barriers to discharge Other (comment) spouse is also ill currently    Co-evaluation               AM-PAC PT "6 Clicks" Mobility  Outcome Measure Help needed turning from your back to your side while in a flat bed without using bedrails?: A Little Help needed moving from lying on your back to sitting on the side of a flat bed without using bedrails?: A Little Help needed moving to and from a bed to a chair (including a wheelchair)?: A Little Help needed standing up from a chair using your arms (e.g., wheelchair or bedside chair)?: A Little Help needed to walk in hospital room?: A Lot   6 Click Score: 14    End of Session Equipment Utilized During Treatment: Oxygen   Patient left: in chair;with call bell/phone within reach   PT Visit Diagnosis: Unsteadiness on feet (R26.81);Muscle weakness (generalized) (M62.81)    Time: [id number] PT Time Calculation (min) (ACUTE ONLY): 29 min   Charges:   PT Evaluation $PT Eval Moderate Complexity: 1 Mod PT Treatments $Therapeutic Activity: 8-22 mins        Horald Chestnut, PT   Delford Field 10/04/2019, 11:12 AM

## 2019-10-04 NOTE — Progress Notes (Signed)
PROGRESS NOTE                                                                                                                                                                                                             Patient Demographics:    Christian Woodard, is a 73 y.o. male, DOB - May 19, 1946, [id number]  Admit date - 10/03/2019   Admitting Physician Truett Mainland, DO  Outpatient Primary MD for the patient is Asencion Noble, MD  LOS - 1   Chief Complaint  Patient presents with  . Shortness of Breath       Brief Narrative    73 y.o. male with a history of diabetes, hyperlipidemia, hypertension.  Patient seen for shortness of breath, cough, and weakness that started a couple days ago and gradually worsened.  He was tested 10 days ago and got results earlier this week for Covid.  He was tested due to an outbreak at his church where he goes and flulike symptoms with body aches, decreased appetite, nausea.  Nonproductive cough for the past 2 days that is worse with exertion and better with rest.  No fevers, diarrhea, or other GI complaints  Emergency Department Course: Oxygen saturation 90 to 92% and improved with nasal cannula.  Chest x-ray shows patchy infiltrates.  White count normal.   Subjective:    Christian Woodard today reports some dyspnea, cough, generalized weakness, reports appetite has improved, denies any chest pain .   Assessment  & Plan :    Principal Problem:   Acute respiratory failure with hypoxia (HCC) Active Problems:   Acute respiratory disease due to COVID-19 virus   Type 2 diabetes mellitus without complication (HCC)   Essential hypertension   Acute hypoxic respiratory failure due to COVID-19 for pneumonia -Patient currently saturating 94% on liters, will drop 87 to 89% on room air, back on oxygen,. -Chest x-ray significant for COVID-19 pneumonia. -Was encouraged to use incentive spirometry, flutter valve, and get  out of bed to chair and to prone -Need to trend inflammatory markers closely, D-dimers and CRP trending down which is reassuring -Continue with zinc and vitamin C -Continue with antitussives -Continue with IV steroids -Continue with IV remdesivir  COVID-19 Labs  Recent Labs    10/03/19 1307 10/03/19 1322 10/04/19 0350  DDIMER 1.15*  --  0.88*  FERRITIN  --  384* 389*  CRP  --  7.4* 4.9*    Lab Results  Component Value Date   SARSCOV2NAA Detected (A) 09/23/2019   [place] Not Detected 09/11/2019   Type 2 diabetes mellitus -We will hold oral hypoglycemic agents and continue with insulin sliding scale during hospital stay.  Hypertension -Continue with home medication  Hyperlipidemia -Continue with statin   Code Status : Full  Family Communication  : D/W patient   Disposition Plan  : Pending PT  Consults  :  None  Procedures  : None  DVT Prophylaxis  : [place] lovenox  Lab Results  Component Value Date   PLT 276 10/04/2019    Antibiotics  :    Anti-infectives (From admission, onward)   Start     Dose/Rate Route Frequency Ordered Stop   10/04/19 1600  remdesivir 100 mg in sodium chloride 0.9 % 250 mL IVPB     100 mg 500 mL/hr over 30 Minutes Intravenous Every 24 hours 10/03/19 2125 10/08/19 1559   10/03/19 2200  remdesivir 200 mg in sodium chloride 0.9 % 250 mL IVPB     200 mg 500 mL/hr over 30 Minutes Intravenous Once 10/03/19 2125 10/04/19 0657        Objective:   Vitals:   10/03/19 1853 10/03/19 2100 10/04/19 0400 10/04/19 0700  BP: 120/71 112/67 103/67 104/60  Pulse: 81   65  Resp: (!) 22 (!) 22 (!) 22 17  Temp: 97.6 F (36.4 C) 97.6 F (36.4 C) 97.7 F (36.5 C) 97.6 F (36.4 C)  TempSrc: Oral Oral Oral Oral  SpO2: 94% 94% 96% 95%  Weight:      Height:        Wt Readings from Last 3 Encounters:  10/03/19 74.8 kg  10/16/17 81.4 kg  08/31/17 80.3 kg     Intake/Output Summary (Last 24 hours) at 10/04/2019 1115 Last data filed at  10/04/2019 0457 Gross per 24 hour  Intake 1449.85 ml  Output -  Net 1449.85 ml     Physical Exam  Awake Alert, Oriented X 3, No new F.N deficits, Normal affect Symmetrical Chest wall movement, Good air movement bilaterally, scattered rales RRR,No Gallops,Rubs or new Murmurs, No Parasternal Heave +ve B.Sounds, Abd Soft, No tenderness, No rebound - guarding or rigidity. No Cyanosis, Clubbing or edema, No new Rash or bruise      Data Review:    CBC Recent Labs  Lab 10/03/19 1307 10/04/19 0350  WBC 7.9 3.8*  HGB 14.9 12.8*  HCT 45.0 39.3  PLT 313 276  MCV 88.6 88.7  MCH 29.3 28.9  MCHC 33.1 32.6  RDW 13.6 13.5  LYMPHSABS 0.4* 0.2*  MONOABS 0.9 0.4  EOSABS 0.0 0.0  BASOSABS 0.0 0.0    Chemistries  Recent Labs  Lab 10/03/19 1307 10/03/19 1915 10/04/19 0350  NA 134*  --  136  K 5.2*  --  3.9  CL 102  --  104  CO2 19*  --  17*  GLUCOSE 102*  --  202*  BUN 30*  --  28*  CREATININE 1.64*  --  1.26*  CALCIUM 8.7*  --  8.2*  MG  --   --  1.7  AST  --  29 25  ALT  --  18 17  ALKPHOS  --  70 59  BILITOT  --  2.1* 1.5*   ------------------------------------------------------------------------------------------------------------------ No results for input(s): CHOL, HDL, LDLCALC, TRIG, CHOLHDL, LDLDIRECT in the last 72 hours.  No results found  for: HGBA1C ------------------------------------------------------------------------------------------------------------------ No results for input(s): TSH, T4TOTAL, T3FREE, THYROIDAB in the last 72 hours.  Invalid input(s): FREET3 ------------------------------------------------------------------------------------------------------------------ Recent Labs    10/03/19 1322 10/04/19 0350  FERRITIN 384* 389*    Coagulation profile No results for input(s): INR, PROTIME in the last 168 hours.  Recent Labs    10/03/19 1307 10/04/19 0350  DDIMER 1.15* 0.88*    Cardiac Enzymes No results for input(s): CKMB,  TROPONINI, MYOGLOBIN in the last 168 hours.  Invalid input(s): CK ------------------------------------------------------------------------------------------------------------------ No results found for: BNP  Inpatient Medications  Scheduled Meds: . albuterol  2 puff Inhalation Q6H  . aspirin  81 mg Oral Daily  . atorvastatin  10 mg Oral QHS  . dexamethasone  6 mg Oral Daily  . enoxaparin (LOVENOX) injection  40 mg Subcutaneous Q24H  . famotidine  20 mg Oral QHS  . insulin aspart  0-20 Units Subcutaneous Q4H  . insulin glargine  20 Units Subcutaneous Daily  . losartan  50 mg Oral Daily  . pantoprazole  40 mg Oral Daily  . vitamin C  500 mg Oral Daily  . zinc sulfate  220 mg Oral Daily   Continuous Infusions: . remdesivir 100 mg in NS 250 mL     PRN Meds:.acetaminophen, chlorpheniramine-HYDROcodone, guaiFENesin-dextromethorphan, ondansetron **OR** ondansetron (ZOFRAN) IV, senna-docusate  Micro Results No results found for this or any previous visit (from the past 240 hour(s)).  Radiology Reports Dg Chest Portable 1 View  Result Date: 10/03/2019 CLINICAL DATA:  Patient with COVID 19. Intermittent shortness of breath. EXAM: PORTABLE CHEST 1 VIEW COMPARISON:  Chest radiograph 04/03/2017. FINDINGS: Monitoring leads overlie the patient. Stable enlarged cardiac and mediastinal contours. Aortic atherosclerosis. Interval development of mild patchy peripheral consolidative opacities. No pleural effusion or pneumothorax. Osseous structures unremarkable. Cholecystectomy clips. IMPRESSION: New mild bilateral peripheral patchy opacities likely sequelae of atypical viral infection. Electronically Signed   By: Lovey Newcomer M.D.   On: 10/03/2019 13:15      Phillips Climes M.D on 10/04/2019 at 11:15 AM  Between 7am to 7pm - Pager - [phone]  After 7pm go to [email or web address] - password [facility]  [facility] -  Office  [phone]

## 2019-10-04 NOTE — Plan of Care (Signed)

## 2019-10-05 DIAGNOSIS — N179 Acute kidney failure, unspecified: Secondary | ICD-10-CM

## 2019-10-05 LAB — GLUCOSE, CAPILLARY
Glucose-Capillary: 143 mg/dL — ABNORMAL HIGH (ref 70–99)
Glucose-Capillary: 151 mg/dL — ABNORMAL HIGH (ref 70–99)
Glucose-Capillary: 255 mg/dL — ABNORMAL HIGH (ref 70–99)
Glucose-Capillary: 275 mg/dL — ABNORMAL HIGH (ref 70–99)
Glucose-Capillary: 330 mg/dL — ABNORMAL HIGH (ref 70–99)
Glucose-Capillary: 336 mg/dL — ABNORMAL HIGH (ref 70–99)
Glucose-Capillary: 368 mg/dL — ABNORMAL HIGH (ref 70–99)
Glucose-Capillary: 90 mg/dL (ref 70–99)

## 2019-10-05 LAB — CBC WITH DIFFERENTIAL/PLATELET
Abs Immature Granulocytes: 0.04 10*3/uL (ref 0.00–0.07)
Basophils Absolute: 0 10*3/uL (ref 0.0–0.1)
Basophils Relative: 0 %
Eosinophils Absolute: 0 10*3/uL (ref 0.0–0.5)
Eosinophils Relative: 0 %
HCT: 37.7 % — ABNORMAL LOW (ref 39.0–52.0)
Hemoglobin: 12.3 g/dL — ABNORMAL LOW (ref 13.0–17.0)
Immature Granulocytes: 1 %
Lymphocytes Relative: 5 %
Lymphs Abs: 0.3 10*3/uL — ABNORMAL LOW (ref 0.7–4.0)
MCH: 28.9 pg (ref 26.0–34.0)
MCHC: 32.6 g/dL (ref 30.0–36.0)
MCV: 88.5 fL (ref 80.0–100.0)
Monocytes Absolute: 0.6 10*3/uL (ref 0.1–1.0)
Monocytes Relative: 8 %
Neutro Abs: 5.6 10*3/uL (ref 1.7–7.7)
Neutrophils Relative %: 86 %
Platelets: 304 10*3/uL (ref 150–400)
RBC: 4.26 MIL/uL (ref 4.22–5.81)
RDW: 13.5 % (ref 11.5–15.5)
WBC: 6.6 10*3/uL (ref 4.0–10.5)
nRBC: 0 % (ref 0.0–0.2)

## 2019-10-05 LAB — COMPREHENSIVE METABOLIC PANEL
ALT: 19 U/L (ref 0–44)
AST: 29 U/L (ref 15–41)
Albumin: 3.1 g/dL — ABNORMAL LOW (ref 3.5–5.0)
Alkaline Phosphatase: 58 U/L (ref 38–126)
Anion gap: 12 (ref 5–15)
BUN: 28 mg/dL — ABNORMAL HIGH (ref 8–23)
CO2: 17 mmol/L — ABNORMAL LOW (ref 22–32)
Calcium: 9 mg/dL (ref 8.9–10.3)
Chloride: 107 mmol/L (ref 98–111)
Creatinine, Ser: 1.32 mg/dL — ABNORMAL HIGH (ref 0.61–1.24)
GFR calc Af Amer: >60 mL/min (ref >60)
GFR calc non Af Amer: 53 mL/min — ABNORMAL LOW (ref >60)
Glucose, Bld: 189 mg/dL — ABNORMAL HIGH (ref 70–99)
Potassium: 3.8 mmol/L (ref 3.5–5.1)
Sodium: 136 mmol/L (ref 135–145)
Total Bilirubin: 0.8 mg/dL (ref 0.3–1.2)
Total Protein: 7.1 g/dL (ref 6.5–8.1)

## 2019-10-05 LAB — C-REACTIVE PROTEIN: CRP: 2.6 mg/dL — ABNORMAL HIGH (ref <1.0)

## 2019-10-05 LAB — D-DIMER, QUANTITATIVE: D-Dimer, Quant: 0.53 ug/mL-FEU — ABNORMAL HIGH (ref 0.00–0.50)

## 2019-10-05 NOTE — Progress Notes (Signed)
Inpatient Diabetes Program Recommendations  AACE/ADA: New Consensus Statement on Inpatient Glycemic Control (2015)  Target Ranges:  Prepandial:   less than 140 mg/dL      Peak postprandial:   less than 180 mg/dL (1-2 hours)      Critically ill patients:  140 - 180 mg/dL   Lab Results  Component Value Date   GLUCAP 336 (H) 10/05/2019    Review of Glycemic Control  Diabetes history: DM2 Outpatient Diabetes medications: Lantus 14 units QHS, glipizide 5 mg QAM, metformin 1000 mg bid, Actos 45 mg QD Current orders for Inpatient glycemic control: Lantus 20 units QD, Novolog 0-20 units Q4H.  Post-prandial blood sugars trending up. Would likely benefit from meal coverage insulin.  Inpatient Diabetes Program Recommendations:     Add Novolog 6 units tidwc for meal coverage insulin if eating > 50% meals HgbA1C to assess glycemic control prior to admission  Will follow closely.  Thank you. Lorenda Peck, RD, LDN, CDE Inpatient Diabetes Coordinator [phone]

## 2019-10-05 NOTE — Progress Notes (Signed)
Called wife Mardene Celeste and given updates.  Answered all questions and concerns.

## 2019-10-05 NOTE — Progress Notes (Signed)
PROGRESS NOTE                                                                                                                                                                                                             Patient Demographics:    Christian Woodard, is a 73 y.o. male, DOB - [birth year], [id number]  Admit date - 10/03/2019   Admitting Physician Truett Mainland, DO  Outpatient Primary MD for the patient is Asencion Noble, MD  LOS - 2   Chief Complaint  Patient presents with  . Shortness of Breath       Brief Narrative    73 y.o. male with a history of diabetes, hyperlipidemia, hypertension.  Patient seen for shortness of breath, cough, and weakness that started a couple days ago and gradually worsened.  He was tested 10 days ago and got results earlier this week for Covid.  He was tested due to an outbreak at his church where he goes and flulike symptoms with body aches, decreased appetite, nausea.  Nonproductive cough for the past 2 days that is worse with exertion and better with rest.  No fevers, diarrhea, or other GI complaints  Emergency Department Course: Oxygen saturation 90 to 92% and improved with nasal cannula.  Chest x-ray shows patchy infiltrates.  White count normal.   Subjective:    [name] today reports dyspnea and cough has improved, denies any chest pain, no fever, no nausea or vomiting.   Assessment  & Plan :    Principal Problem:   Acute respiratory failure with hypoxia (HCC) Active Problems:   Acute respiratory disease due to COVID-19 virus   Type 2 diabetes mellitus without complication (HCC)   Essential hypertension   Acute hypoxic respiratory failure due to COVID-19  pneumonia -He is on 2 L nasal cannula today . -Chest x-ray significant for COVID-19 pneumonia. -Was encouraged to use incentive spirometry, flutter valve, and get out of bed to chair and to prone -Need to trend inflammatory markers  closely, D-dimers and CRP trending down which is reassuring -Continue with zinc and vitamin C -Continue with antitussives -Continue with IV steroids -Continue with IV remdesivir  COVID-19 Labs  Recent Labs    10/03/19 1307 10/03/19 1322 10/04/19 0350 10/05/19 0126  DDIMER 1.15*  --  0.88* 0.53*  FERRITIN  --  384*  389*  --   CRP  --  7.4* 4.9* 2.6*    Lab Results  Component Value Date   SARSCOV2NAA Detected (A) 09/23/2019   [place] Not Detected 09/11/2019   Type 2 diabetes mellitus -We will hold oral hypoglycemic agents and continue with insulin sliding scale during hospital stay.  Continue with Lantus 20 units subcu daily.  AKI -Baseline creatinine is 1, was elevated 1.6 on admission, it is improving avoid nephrotoxic medication  Hypertension -Continue with home medication  Hyperlipidemia -Continue with statin   Code Status : Full  Family Communication  : D/W patient   Disposition Plan  : Home  Consults  :  None  Procedures  : None  DVT Prophylaxis  : [place] lovenox  Lab Results  Component Value Date   PLT 304 10/05/2019    Antibiotics  :    Anti-infectives (From admission, onward)   Start     Dose/Rate Route Frequency Ordered Stop   10/04/19 1600  remdesivir 100 mg in sodium chloride 0.9 % 250 mL IVPB     100 mg 500 mL/hr over 30 Minutes Intravenous Every 24 hours 10/03/19 2125 10/08/19 1559   10/03/19 2200  remdesivir 200 mg in sodium chloride 0.9 % 250 mL IVPB     200 mg 500 mL/hr over 30 Minutes Intravenous Once 10/03/19 2125 10/04/19 0657        Objective:   Vitals:   10/04/19 0700 10/04/19 2000 10/05/19 0500 10/05/19 0714  BP: 104/60 119/68 (!) 111/59 91/79  Pulse: 65 62 72 69  Resp: 17 18 18 18   Temp: 97.6 F (36.4 C) 97.7 F (36.5 C) (!) 97.5 F (36.4 C) 97.7 F (36.5 C)  TempSrc: Oral Oral Oral Oral  SpO2: 95% 98% 97% 96%  Weight:      Height:        Wt Readings from Last 3 Encounters:  10/03/19 74.8 kg  10/16/17 81.4 kg   08/31/17 80.3 kg     Intake/Output Summary (Last 24 hours) at 10/05/2019 1128 Last data filed at 10/05/2019 0506 Gross per 24 hour  Intake 120 ml  Output 1275 ml  Net -1155 ml     Physical Exam  Awake Alert, Oriented X 3, No new F.N deficits, Normal affect Symmetrical Chest wall movement, Good air movement bilaterally, CTAB RRR,No Gallops,Rubs or new Murmurs, No Parasternal Heave +ve B.Sounds, Abd Soft, No tenderness, No rebound - guarding or rigidity. No Cyanosis, Clubbing or edema, No new Rash or bruise      Data Review:    CBC Recent Labs  Lab 10/03/19 1307 10/04/19 0350 10/05/19 0126  WBC 7.9 3.8* 6.6  HGB 14.9 12.8* 12.3*  HCT 45.0 39.3 37.7*  PLT 313 276 304  MCV 88.6 88.7 88.5  MCH 29.3 28.9 28.9  MCHC 33.1 32.6 32.6  RDW 13.6 13.5 13.5  LYMPHSABS 0.4* 0.2* 0.3*  MONOABS 0.9 0.4 0.6  EOSABS 0.0 0.0 0.0  BASOSABS 0.0 0.0 0.0    Chemistries  Recent Labs  Lab 10/03/19 1307 10/03/19 1915 10/04/19 0350 10/05/19 0126  NA 134*  --  136 136  K 5.2*  --  3.9 3.8  CL 102  --  104 107  CO2 19*  --  17* 17*  GLUCOSE 102*  --  202* 189*  BUN 30*  --  28* 28*  CREATININE 1.64*  --  1.26* 1.32*  CALCIUM 8.7*  --  8.2* 9.0  MG  --   --  1.7  --  AST  --  29 25 29   ALT  --  18 17 19   ALKPHOS  --  70 59 58  BILITOT  --  2.1* 1.5* 0.8   ------------------------------------------------------------------------------------------------------------------ No results for input(s): CHOL, HDL, LDLCALC, TRIG, CHOLHDL, LDLDIRECT in the last 72 hours.  No results found for: HGBA1C ------------------------------------------------------------------------------------------------------------------ No results for input(s): TSH, T4TOTAL, T3FREE, THYROIDAB in the last 72 hours.  Invalid input(s): FREET3 ------------------------------------------------------------------------------------------------------------------ Recent Labs    10/03/19 1322 10/04/19 0350  FERRITIN  384* 389*    Coagulation profile No results for input(s): INR, PROTIME in the last 168 hours.  Recent Labs    10/04/19 0350 10/05/19 0126  DDIMER 0.88* 0.53*    Cardiac Enzymes No results for input(s): CKMB, TROPONINI, MYOGLOBIN in the last 168 hours.  Invalid input(s): CK ------------------------------------------------------------------------------------------------------------------ No results found for: BNP  Inpatient Medications  Scheduled Meds: . albuterol  2 puff Inhalation Q6H  . aspirin  81 mg Oral Daily  . atorvastatin  10 mg Oral QHS  . dexamethasone  6 mg Oral Daily  . enoxaparin (LOVENOX) injection  40 mg Subcutaneous Q24H  . famotidine  20 mg Oral QHS  . insulin aspart  0-20 Units Subcutaneous Q4H  . insulin glargine  20 Units Subcutaneous Daily  . losartan  50 mg Oral Daily  . pantoprazole  40 mg Oral Daily  . vitamin C  500 mg Oral Daily  . zinc sulfate  220 mg Oral Daily   Continuous Infusions: . remdesivir 100 mg in NS 250 mL Stopped (10/05/19 0218)   PRN Meds:.acetaminophen, chlorpheniramine-HYDROcodone, guaiFENesin-dextromethorphan, ondansetron **OR** ondansetron (ZOFRAN) IV, senna-docusate  Micro Results No results found for this or any previous visit (from the past 240 hour(s)).  Radiology Reports Dg Chest Portable 1 View  Result Date: 10/03/2019 CLINICAL DATA:  Patient with COVID 19. Intermittent shortness of breath. EXAM: PORTABLE CHEST 1 VIEW COMPARISON:  Chest radiograph 04/03/2017. FINDINGS: Monitoring leads overlie the patient. Stable enlarged cardiac and mediastinal contours. Aortic atherosclerosis. Interval development of mild patchy peripheral consolidative opacities. No pleural effusion or pneumothorax. Osseous structures unremarkable. Cholecystectomy clips. IMPRESSION: New mild bilateral peripheral patchy opacities likely sequelae of atypical viral infection. Electronically Signed   By: Lovey Newcomer M.D.   On: 10/03/2019 13:15       Phillips Climes M.D on 10/05/2019 at 11:28 AM  Between 7am to 7pm - Pager - [phone]  After 7pm go to [email or web address] - password [facility]  [facility] -  Office  [phone]

## 2019-10-06 LAB — C-REACTIVE PROTEIN: CRP: 1.2 mg/dL — ABNORMAL HIGH (ref <1.0)

## 2019-10-06 LAB — COMPREHENSIVE METABOLIC PANEL
ALT: 20 U/L (ref 0–44)
AST: 26 U/L (ref 15–41)
Albumin: 3.4 g/dL — ABNORMAL LOW (ref 3.5–5.0)
Alkaline Phosphatase: 52 U/L (ref 38–126)
Anion gap: 9 (ref 5–15)
BUN: 26 mg/dL — ABNORMAL HIGH (ref 8–23)
CO2: 22 mmol/L (ref 22–32)
Calcium: 8.8 mg/dL — ABNORMAL LOW (ref 8.9–10.3)
Chloride: 107 mmol/L (ref 98–111)
Creatinine, Ser: 1.07 mg/dL (ref 0.61–1.24)
GFR calc Af Amer: >60 mL/min (ref >60)
GFR calc non Af Amer: >60 mL/min (ref >60)
Glucose, Bld: 149 mg/dL — ABNORMAL HIGH (ref 70–99)
Potassium: 3.7 mmol/L (ref 3.5–5.1)
Sodium: 138 mmol/L (ref 135–145)
Total Bilirubin: 0.9 mg/dL (ref 0.3–1.2)
Total Protein: 6.8 g/dL (ref 6.5–8.1)

## 2019-10-06 LAB — CBC WITH DIFFERENTIAL/PLATELET
Abs Immature Granulocytes: 0.05 10*3/uL (ref 0.00–0.07)
Basophils Absolute: 0 10*3/uL (ref 0.0–0.1)
Basophils Relative: 0 %
Eosinophils Absolute: 0 10*3/uL (ref 0.0–0.5)
Eosinophils Relative: 0 %
HCT: 38.6 % — ABNORMAL LOW (ref 39.0–52.0)
Hemoglobin: 12.7 g/dL — ABNORMAL LOW (ref 13.0–17.0)
Immature Granulocytes: 1 %
Lymphocytes Relative: 7 %
Lymphs Abs: 0.6 10*3/uL — ABNORMAL LOW (ref 0.7–4.0)
MCH: 29.4 pg (ref 26.0–34.0)
MCHC: 32.9 g/dL (ref 30.0–36.0)
MCV: 89.4 fL (ref 80.0–100.0)
Monocytes Absolute: 0.9 10*3/uL (ref 0.1–1.0)
Monocytes Relative: 11 %
Neutro Abs: 6.9 10*3/uL (ref 1.7–7.7)
Neutrophils Relative %: 81 %
Platelets: 349 10*3/uL (ref 150–400)
RBC: 4.32 MIL/uL (ref 4.22–5.81)
RDW: 13.7 % (ref 11.5–15.5)
WBC: 8.5 10*3/uL (ref 4.0–10.5)
nRBC: 0 % (ref 0.0–0.2)

## 2019-10-06 LAB — HEMOGLOBIN A1C
Hgb A1c MFr Bld: 7.4 % — ABNORMAL HIGH (ref 4.8–5.6)
Mean Plasma Glucose: 165.68 mg/dL

## 2019-10-06 LAB — D-DIMER, QUANTITATIVE: D-Dimer, Quant: 0.57 ug/mL-FEU — ABNORMAL HIGH (ref 0.00–0.50)

## 2019-10-06 LAB — GLUCOSE, CAPILLARY
Glucose-Capillary: 132 mg/dL — ABNORMAL HIGH (ref 70–99)
Glucose-Capillary: 109 mg/dL — ABNORMAL HIGH (ref 70–99)
Glucose-Capillary: 312 mg/dL — ABNORMAL HIGH (ref 70–99)
Glucose-Capillary: 353 mg/dL — ABNORMAL HIGH (ref 70–99)
Glucose-Capillary: 224 mg/dL — ABNORMAL HIGH (ref 70–99)

## 2019-10-06 MED ORDER — INSULIN ASPART 100 UNIT/ML ~~LOC~~ SOLN
0.0000 [IU] | Freq: Three times a day (TID) | SUBCUTANEOUS | Status: DC
  Administered 2019-10-06: 14:00:00 15 [IU] via SUBCUTANEOUS
  Administered 2019-10-06: 17:00:00 20 [IU] via SUBCUTANEOUS
  Administered 2019-10-07: 13:00:00 7 [IU] via SUBCUTANEOUS
  Administered 2019-10-07: 08:00:00 3 [IU] via SUBCUTANEOUS

## 2019-10-06 MED ORDER — INSULIN ASPART 100 UNIT/ML ~~LOC~~ SOLN
0.0000 [IU] | Freq: Every day | SUBCUTANEOUS | Status: DC
  Administered 2019-10-06: 2 [IU] via SUBCUTANEOUS

## 2019-10-06 MED ORDER — INSULIN ASPART 100 UNIT/ML ~~LOC~~ SOLN
4.0000 [IU] | Freq: Three times a day (TID) | SUBCUTANEOUS | Status: DC
  Administered 2019-10-06 – 2019-10-07 (×4): 4 [IU] via SUBCUTANEOUS

## 2019-10-06 NOTE — Progress Notes (Signed)
Inpatient Diabetes Program Recommendations  AACE/ADA: New Consensus Statement on Inpatient Glycemic Control   Target Ranges:  Prepandial:   less than 140 mg/dL      Peak postprandial:   less than 180 mg/dL (1-2 hours)      Critically ill patients:  140 - 180 mg/dL   Results for Christian Woodard, Christian Woodard (MRN [id number]) as of 10/06/2019 09:11  Ref. Range 10/05/2019 07:16 10/05/2019 11:24 10/05/2019 15:55 10/05/2019 19:27 10/05/2019 23:22 10/06/2019 02:50 10/06/2019 07:34  Glucose-Capillary Latest Ref Range: 70 - 99 mg/dL 143 (H)  Novolog 3 units  Lantus 20 units 330 (H)  Novolog 15 units 275 (H)  Novolog 11 units 336 (H)  Novolog 15 units 151 (H)  Novolog 4 units 132 (H)  Novolog 3 units 109 (H)   Review of Glycemic Control  Diabetes history: DM2 Outpatient Diabetes medications: Lantus 14 units QHS, Glipizide 5 mg QAM, Metformin 1000 mg BID, Actos 45 mg daily Current orders for Inpatient glycemic control: Lantus 20 units daily, Novolog 0-20 units TID with meals, Novolog 0-5 units QHS; Decadron 6 mg daily  Inpatient Diabetes Program Recommendations:   Insulin-Basal: If steroids are continued, please consider increasing Lantus to 25 units daily.  Insulin-Meal Coverage: If steroids are continued, please consider ordering Novolog 6 units TID with meals for meal coverage if patient eats at least 50% of meals.  Thanks, Barnie Alderman, RN, MSN, CDE Diabetes Coordinator Inpatient Diabetes Program [phone] (Team Pager from 8am to 5pm)

## 2019-10-06 NOTE — Progress Notes (Signed)
Unable to get an oral or axillary temperature on patient.  Rectal reading 96.4.  Paged Dr. Shanon Brow and made her aware.  Placed warm blankets on patient and reassessed temperature 97.0 axillary.

## 2019-10-06 NOTE — Progress Notes (Addendum)
PROGRESS NOTE                                                                                                                                                                                                             Patient Demographics:    Christian Woodard, is a 73 y.o. male, DOB - 02-Apr-1946, [id number]  Admit date - 10/03/2019   Admitting Physician Albertine Patricia, MD  Outpatient Primary MD for the patient is Asencion Noble, MD  LOS - 3   Chief Complaint  Patient presents with  . Shortness of Breath       Brief Narrative    73 y.o. male with a history of diabetes, hyperlipidemia, hypertension.  Patient seen for shortness of breath, cough, and weakness that started a couple days ago and gradually worsened.  He was tested 10 days ago and got results earlier this week for Covid.  He was tested due to an outbreak at his church where he goes and flulike symptoms with body aches, decreased appetite, nausea.  Nonproductive cough for the past 2 days that is worse with exertion and better with rest.  No fevers, diarrhea, or other GI complaints  Emergency Department Course: Oxygen saturation 90 to 92% and improved with nasal cannula.  Chest x-ray shows patchy infiltrates.  White count normal.   Subjective:    [name] today his dyspnea has improved, mainly upon exertion, reports some cough, no chest pain, no fever or chills .   Assessment  & Plan :    Principal Problem:   Acute respiratory failure with hypoxia (HCC) Active Problems:   Acute respiratory disease due to COVID-19 virus   Type 2 diabetes mellitus without complication (HCC)   Essential hypertension   Acute hypoxic respiratory failure due to COVID-19  pneumonia -Down to 1 L nasal cannula today, I do anticipate he will need home oxygen on discharge, discussed with staff, will ambulate in the hallway today to see his oxygen requirement. -Chest x-ray significant for COVID-19  pneumonia. -Was encouraged to use incentive spirometry, flutter valve, and get out of bed to chair and to prone -Need to trend inflammatory markers closely, D-dimers and CRP trending down which is reassuring -Continue with zinc and vitamin C -Continue with antitussives -Continue with IV steroids -Continue with IV remdesivir  COVID-19 Labs  Recent Labs  10/03/19 1322 10/04/19 0350 10/05/19 0126 10/06/19 0511  DDIMER  --  0.88* 0.53* 0.57*  FERRITIN 384* 389*  --   --   CRP 7.4* 4.9* 2.6* 1.2*    Lab Results  Component Value Date   SARSCOV2NAA Detected (A) 09/23/2019   [place] Not Detected 09/11/2019   Type 2 diabetes mellitus -We will hold oral hypoglycemic agents and continue with insulin sliding scale during hospital stay.  Continue with Lantus 20 units subcu daily.  Will add NovoLog 40 units before meals  AKI -Baseline creatinine is 1, was elevated 1.6 on admission, resolved, back to baseline. - avoid nephrotoxic medication  Hypertension -Continue with home medication  Hyperlipidemia -Continue with statin   Code Status : Full  Family Communication  : D/W patient   Disposition Plan  : Home  Consults  :  None  Procedures  : None  DVT Prophylaxis  : [place] lovenox  Lab Results  Component Value Date   PLT 349 10/06/2019    Antibiotics  :    Anti-infectives (From admission, onward)   Start     Dose/Rate Route Frequency Ordered Stop   10/04/19 1600  remdesivir 100 mg in sodium chloride 0.9 % 250 mL IVPB     100 mg 500 mL/hr over 30 Minutes Intravenous Every 24 hours 10/03/19 2125 10/08/19 1559   10/03/19 2200  remdesivir 200 mg in sodium chloride 0.9 % 250 mL IVPB     200 mg 500 mL/hr over 30 Minutes Intravenous Once 10/03/19 2125 10/04/19 0657        Objective:   Vitals:   10/06/19 0254 10/06/19 0416 10/06/19 0653 10/06/19 0736  BP: 100/61   127/69  Pulse: 63   66  Resp: 18   20  Temp: (!) 96.4 F (35.8 C) (!) 97 F (36.1 C) 98.1 F (36.7  C) (!) 97.5 F (36.4 C)  TempSrc: Rectal Axillary Oral Oral  SpO2: 95%     Weight:      Height:        Wt Readings from Last 3 Encounters:  10/03/19 74.8 kg  10/16/17 81.4 kg  08/31/17 80.3 kg     Intake/Output Summary (Last 24 hours) at 10/06/2019 1054 Last data filed at 10/05/2019 2328 Gross per 24 hour  Intake -  Output 700 ml  Net -700 ml     Physical Exam  Awake Alert, Oriented X 3, No new F.N deficits, Normal affect Symmetrical Chest wall movement, Good air movement bilaterally, CTAB RRR,No Gallops,Rubs or new Murmurs, No Parasternal Heave +ve B.Sounds, Abd Soft, No tenderness, No rebound - guarding or rigidity. No Cyanosis, Clubbing or edema, No new Rash or bruise      Data Review:    CBC Recent Labs  Lab 10/03/19 1307 10/04/19 0350 10/05/19 0126 10/06/19 0511  WBC 7.9 3.8* 6.6 8.5  HGB 14.9 12.8* 12.3* 12.7*  HCT 45.0 39.3 37.7* 38.6*  PLT 313 276 304 349  MCV 88.6 88.7 88.5 89.4  MCH 29.3 28.9 28.9 29.4  MCHC 33.1 32.6 32.6 32.9  RDW 13.6 13.5 13.5 13.7  LYMPHSABS 0.4* 0.2* 0.3* 0.6*  MONOABS 0.9 0.4 0.6 0.9  EOSABS 0.0 0.0 0.0 0.0  BASOSABS 0.0 0.0 0.0 0.0    Chemistries  Recent Labs  Lab 10/03/19 1307 10/03/19 1915 10/04/19 0350 10/05/19 0126 10/06/19 0511  NA 134*  --  136 136 138  K 5.2*  --  3.9 3.8 3.7  CL 102  --  104 107 107  CO2 19*  --  17* 17* 22  GLUCOSE 102*  --  202* 189* 149*  BUN 30*  --  28* 28* 26*  CREATININE 1.64*  --  1.26* 1.32* 1.07  CALCIUM 8.7*  --  8.2* 9.0 8.8*  MG  --   --  1.7  --   --   AST  --  29 25 29 26   ALT  --  18 17 19 20   ALKPHOS  --  70 59 58 52  BILITOT  --  2.1* 1.5* 0.8 0.9   ------------------------------------------------------------------------------------------------------------------ No results for input(s): CHOL, HDL, LDLCALC, TRIG, CHOLHDL, LDLDIRECT in the last 72 hours.  No results found for:  HGBA1C ------------------------------------------------------------------------------------------------------------------ No results for input(s): TSH, T4TOTAL, T3FREE, THYROIDAB in the last 72 hours.  Invalid input(s): FREET3 ------------------------------------------------------------------------------------------------------------------ Recent Labs    10/03/19 1322 10/04/19 0350  FERRITIN 384* 389*    Coagulation profile No results for input(s): INR, PROTIME in the last 168 hours.  Recent Labs    10/05/19 0126 10/06/19 0511  DDIMER 0.53* 0.57*    Cardiac Enzymes No results for input(s): CKMB, TROPONINI, MYOGLOBIN in the last 168 hours.  Invalid input(s): CK ------------------------------------------------------------------------------------------------------------------ No results found for: BNP  Inpatient Medications  Scheduled Meds: . albuterol  2 puff Inhalation Q6H  . aspirin  81 mg Oral Daily  . atorvastatin  10 mg Oral QHS  . dexamethasone  6 mg Oral Daily  . enoxaparin (LOVENOX) injection  40 mg Subcutaneous Q24H  . famotidine  20 mg Oral QHS  . insulin aspart  0-20 Units Subcutaneous TID WC  . insulin aspart  0-5 Units Subcutaneous QHS  . insulin aspart  4 Units Subcutaneous TID WC  . insulin glargine  20 Units Subcutaneous Daily  . losartan  50 mg Oral Daily  . pantoprazole  40 mg Oral Daily  . vitamin C  500 mg Oral Daily  . zinc sulfate  220 mg Oral Daily   Continuous Infusions: . remdesivir 100 mg in NS 250 mL 100 mg (10/05/19 1549)   PRN Meds:.acetaminophen, chlorpheniramine-HYDROcodone, guaiFENesin-dextromethorphan, ondansetron **OR** ondansetron (ZOFRAN) IV, senna-docusate  Micro Results No results found for this or any previous visit (from the past 240 hour(s)).  Radiology Reports Dg Chest Portable 1 View  Result Date: 10/03/2019 CLINICAL DATA:  Patient with COVID 19. Intermittent shortness of breath. EXAM: PORTABLE CHEST 1 VIEW  COMPARISON:  Chest radiograph 04/03/2017. FINDINGS: Monitoring leads overlie the patient. Stable enlarged cardiac and mediastinal contours. Aortic atherosclerosis. Interval development of mild patchy peripheral consolidative opacities. No pleural effusion or pneumothorax. Osseous structures unremarkable. Cholecystectomy clips. IMPRESSION: New mild bilateral peripheral patchy opacities likely sequelae of atypical viral infection. Electronically Signed   By: Lovey Newcomer M.D.   On: 10/03/2019 13:15      Phillips Climes M.D on 10/06/2019 at 10:54 AM  Between 7am to 7pm - Pager - [phone]  After 7pm go to [email or web address] - password [facility]  [facility] -  Office  [phone]                                          PROGRESS NOTE  Patient Demographics:    Christian Woodard, is a 73 y.o. male, DOB - September 02, 1946, [id number]  Admit date - 10/03/2019   Admitting Physician Albertine Patricia, MD  Outpatient Primary MD for the patient is Asencion Noble, MD  LOS - 3   Chief Complaint  Patient presents with  . Shortness of Breath       Brief Narrative    73 y.o. male with a history of diabetes, hyperlipidemia, hypertension.  Patient seen for shortness of breath, cough, and weakness that started a couple days ago and gradually worsened.  He was tested 10 days ago and got results earlier this week for Covid.  He was tested due to an outbreak at his church where he goes and flulike symptoms with body aches, decreased appetite, nausea.  Nonproductive cough for the past 2 days that is worse with exertion and better with rest.  No fevers, diarrhea, or other GI complaints  Emergency Department Course: Oxygen saturation 90 to 92% and improved with nasal cannula.  Chest x-ray shows patchy  infiltrates.  White count normal.   Subjective:    [name] today reports dyspnea and cough has improved, denies any chest pain, no fever, no nausea or vomiting.   Assessment  & Plan :    Principal Problem:   Acute respiratory failure with hypoxia (HCC) Active Problems:   Acute respiratory disease due to COVID-19 virus   Type 2 diabetes mellitus without complication (HCC)   Essential hypertension   Acute hypoxic respiratory failure due to COVID-19  pneumonia -He is on 2 L nasal cannula today . -Chest x-ray significant for COVID-19 pneumonia. -Was encouraged to use incentive spirometry, flutter valve, and get out of bed to chair and to prone -Need to trend inflammatory markers closely, D-dimers and CRP trending down which is reassuring -Continue with zinc and vitamin C -Continue with antitussives -Continue with IV steroids -Continue with IV remdesivir  COVID-19 Labs  Recent Labs    10/03/19 1322 10/04/19 0350 10/05/19 0126 10/06/19 0511  DDIMER  --  0.88* 0.53* 0.57*  FERRITIN 384* 389*  --   --   CRP 7.4* 4.9* 2.6* 1.2*    Lab Results  Component Value Date   SARSCOV2NAA Detected (A) 09/23/2019   [place] Not Detected 09/11/2019   Type 2 diabetes mellitus -We will hold oral hypoglycemic agents and continue with insulin sliding scale during hospital stay.  Continue with Lantus 20 units subcu daily.  AKI -Baseline creatinine is 1, was elevated 1.6 on admission, it is improving avoid nephrotoxic medication  Hypertension -Continue with home medication  Hyperlipidemia -Continue with statin   Code Status : Full  Family Communication  : D/W patient   Disposition Plan  : Home  Consults  :  None  Procedures  : None  DVT Prophylaxis  : [place] lovenox  Lab Results  Component Value Date   PLT 349 10/06/2019    Antibiotics  :    Anti-infectives (From admission, onward)   Start     Dose/Rate Route Frequency Ordered Stop   10/04/19 1600  remdesivir  100 mg in sodium chloride 0.9 % 250 mL IVPB     100 mg 500 mL/hr over 30 Minutes Intravenous Every 24 hours 10/03/19 2125 10/08/19 1559   10/03/19 2200  remdesivir 200 mg in sodium chloride 0.9 % 250 mL IVPB     200 mg 500 mL/hr over 30 Minutes Intravenous Once 10/03/19 2125 10/04/19 0657        Objective:  Vitals:   10/06/19 0254 10/06/19 0416 10/06/19 0653 10/06/19 0736  BP: 100/61   127/69  Pulse: 63   66  Resp: 18   20  Temp: (!) 96.4 F (35.8 C) (!) 97 F (36.1 C) 98.1 F (36.7 C) (!) 97.5 F (36.4 C)  TempSrc: Rectal Axillary Oral Oral  SpO2: 95%     Weight:      Height:        Wt Readings from Last 3 Encounters:  10/03/19 74.8 kg  10/16/17 81.4 kg  08/31/17 80.3 kg     Intake/Output Summary (Last 24 hours) at 10/06/2019 1104 Last data filed at 10/05/2019 2328 Gross per 24 hour  Intake -  Output 700 ml  Net -700 ml     Physical Exam  Awake Alert, Oriented X 3, No new F.N deficits, Normal affect Symmetrical Chest wall movement, Good air movement bilaterally, CTAB RRR,No Gallops,Rubs or new Murmurs, No Parasternal Heave +ve B.Sounds, Abd Soft, No tenderness, No rebound - guarding or rigidity. No Cyanosis, Clubbing or edema, No new Rash or bruise       Data Review:    CBC Recent Labs  Lab 10/03/19 1307 10/04/19 0350 10/05/19 0126 10/06/19 0511  WBC 7.9 3.8* 6.6 8.5  HGB 14.9 12.8* 12.3* 12.7*  HCT 45.0 39.3 37.7* 38.6*  PLT 313 276 304 349  MCV 88.6 88.7 88.5 89.4  MCH 29.3 28.9 28.9 29.4  MCHC 33.1 32.6 32.6 32.9  RDW 13.6 13.5 13.5 13.7  LYMPHSABS 0.4* 0.2* 0.3* 0.6*  MONOABS 0.9 0.4 0.6 0.9  EOSABS 0.0 0.0 0.0 0.0  BASOSABS 0.0 0.0 0.0 0.0    Chemistries  Recent Labs  Lab 10/03/19 1307 10/03/19 1915 10/04/19 0350 10/05/19 0126 10/06/19 0511  NA 134*  --  136 136 138  K 5.2*  --  3.9 3.8 3.7  CL 102  --  104 107 107  CO2 19*  --  17* 17* 22  GLUCOSE 102*  --  202* 189* 149*  BUN 30*  --  28* 28* 26*  CREATININE 1.64*  --   1.26* 1.32* 1.07  CALCIUM 8.7*  --  8.2* 9.0 8.8*  MG  --   --  1.7  --   --   AST  --  29 25 29 26   ALT  --  18 17 19 20   ALKPHOS  --  70 59 58 52  BILITOT  --  2.1* 1.5* 0.8 0.9   ------------------------------------------------------------------------------------------------------------------ No results for input(s): CHOL, HDL, LDLCALC, TRIG, CHOLHDL, LDLDIRECT in the last 72 hours.  No results found for: HGBA1C ------------------------------------------------------------------------------------------------------------------ No results for input(s): TSH, T4TOTAL, T3FREE, THYROIDAB in the last 72 hours.  Invalid input(s): FREET3 ------------------------------------------------------------------------------------------------------------------ Recent Labs    10/03/19 1322 10/04/19 0350  FERRITIN 384* 389*    Coagulation profile No results for input(s): INR, PROTIME in the last 168 hours.  Recent Labs    10/05/19 0126 10/06/19 0511  DDIMER 0.53* 0.57*    Cardiac Enzymes No results for input(s): CKMB, TROPONINI, MYOGLOBIN in the last 168 hours.  Invalid input(s): CK ------------------------------------------------------------------------------------------------------------------ No results found for: BNP  Inpatient Medications  Scheduled Meds: . albuterol  2 puff Inhalation Q6H  . aspirin  81 mg Oral Daily  . atorvastatin  10 mg Oral QHS  . dexamethasone  6 mg Oral Daily  . enoxaparin (LOVENOX) injection  40 mg Subcutaneous Q24H  . famotidine  20 mg Oral QHS  . insulin aspart  0-20 Units Subcutaneous  TID WC  . insulin aspart  0-5 Units Subcutaneous QHS  . insulin aspart  4 Units Subcutaneous TID WC  . insulin glargine  20 Units Subcutaneous Daily  . losartan  50 mg Oral Daily  . pantoprazole  40 mg Oral Daily  . vitamin C  500 mg Oral Daily  . zinc sulfate  220 mg Oral Daily   Continuous Infusions: . remdesivir 100 mg in NS 250 mL 100 mg (10/05/19 1549)    PRN Meds:.acetaminophen, chlorpheniramine-HYDROcodone, guaiFENesin-dextromethorphan, ondansetron **OR** ondansetron (ZOFRAN) IV, senna-docusate  Micro Results No results found for this or any previous visit (from the past 240 hour(s)).  Radiology Reports Dg Chest Portable 1 View  Result Date: 10/03/2019 CLINICAL DATA:  Patient with COVID 19. Intermittent shortness of breath. EXAM: PORTABLE CHEST 1 VIEW COMPARISON:  Chest radiograph 04/03/2017. FINDINGS: Monitoring leads overlie the patient. Stable enlarged cardiac and mediastinal contours. Aortic atherosclerosis. Interval development of mild patchy peripheral consolidative opacities. No pleural effusion or pneumothorax. Osseous structures unremarkable. Cholecystectomy clips. IMPRESSION: New mild bilateral peripheral patchy opacities likely sequelae of atypical viral infection. Electronically Signed   By: Lovey Newcomer M.D.   On: 10/03/2019 13:15      Phillips Climes M.D on 10/06/2019 at 11:04 AM  Between 7am to 7pm - Pager - [phone]  After 7pm go to [email or web address] - password [facility]  [facility] -  Office  [phone]

## 2019-10-07 DIAGNOSIS — Z23 Encounter for immunization: Secondary | ICD-10-CM | POA: Diagnosis not present

## 2019-10-07 LAB — D-DIMER, QUANTITATIVE: D-Dimer, Quant: 0.57 ug/mL-FEU — ABNORMAL HIGH (ref 0.00–0.50)

## 2019-10-07 LAB — CBC WITH DIFFERENTIAL/PLATELET
Abs Immature Granulocytes: 0.06 10*3/uL (ref 0.00–0.07)
Basophils Absolute: 0 10*3/uL (ref 0.0–0.1)
Basophils Relative: 0 %
Eosinophils Absolute: 0 10*3/uL (ref 0.0–0.5)
Eosinophils Relative: 0 %
HCT: 39.8 % (ref 39.0–52.0)
Hemoglobin: 12.7 g/dL — ABNORMAL LOW (ref 13.0–17.0)
Immature Granulocytes: 1 %
Lymphocytes Relative: 8 %
Lymphs Abs: 0.6 10*3/uL — ABNORMAL LOW (ref 0.7–4.0)
MCH: 29.1 pg (ref 26.0–34.0)
MCHC: 31.9 g/dL (ref 30.0–36.0)
MCV: 91.3 fL (ref 80.0–100.0)
Monocytes Absolute: 0.8 10*3/uL (ref 0.1–1.0)
Monocytes Relative: 11 %
Neutro Abs: 6.4 10*3/uL (ref 1.7–7.7)
Neutrophils Relative %: 80 %
Platelets: 364 10*3/uL (ref 150–400)
RBC: 4.36 MIL/uL (ref 4.22–5.81)
RDW: 13.6 % (ref 11.5–15.5)
WBC: 7.9 10*3/uL (ref 4.0–10.5)
nRBC: 0 % (ref 0.0–0.2)

## 2019-10-07 LAB — COMPREHENSIVE METABOLIC PANEL
ALT: 37 U/L (ref 0–44)
AST: 43 U/L — ABNORMAL HIGH (ref 15–41)
Albumin: 3.4 g/dL — ABNORMAL LOW (ref 3.5–5.0)
Alkaline Phosphatase: 58 U/L (ref 38–126)
Anion gap: 14 (ref 5–15)
BUN: 22 mg/dL (ref 8–23)
CO2: 21 mmol/L — ABNORMAL LOW (ref 22–32)
Calcium: 9.1 mg/dL (ref 8.9–10.3)
Chloride: 108 mmol/L (ref 98–111)
Creatinine, Ser: 1.02 mg/dL (ref 0.61–1.24)
GFR calc Af Amer: >60 mL/min (ref >60)
GFR calc non Af Amer: >60 mL/min (ref >60)
Glucose, Bld: 129 mg/dL — ABNORMAL HIGH (ref 70–99)
Potassium: 3.4 mmol/L — ABNORMAL LOW (ref 3.5–5.1)
Sodium: 143 mmol/L (ref 135–145)
Total Bilirubin: 0.9 mg/dL (ref 0.3–1.2)
Total Protein: 7 g/dL (ref 6.5–8.1)

## 2019-10-07 LAB — C-REACTIVE PROTEIN: CRP: 1 mg/dL — ABNORMAL HIGH (ref <1.0)

## 2019-10-07 LAB — GLUCOSE, CAPILLARY
Glucose-Capillary: 132 mg/dL — ABNORMAL HIGH (ref 70–99)
Glucose-Capillary: 241 mg/dL — ABNORMAL HIGH (ref 70–99)

## 2019-10-07 MED ORDER — SODIUM CHLORIDE 0.9 % IV SOLN
100.0000 mg | INTRAVENOUS | Status: AC
  Administered 2019-10-07: 13:00:00 100 mg via INTRAVENOUS
  Filled 2019-10-07: qty 20

## 2019-10-07 MED ORDER — INFLUENZA VAC A&B SA ADJ QUAD 0.5 ML IM PRSY
0.5000 mL | PREFILLED_SYRINGE | INTRAMUSCULAR | Status: AC
  Administered 2019-10-07: 13:00:00 0.5 mL via INTRAMUSCULAR
  Filled 2019-10-07: qty 0.5

## 2019-10-07 MED ORDER — DEXAMETHASONE 6 MG PO TABS: 6.0000 mg | ORAL_TABLET | Freq: Every day | ORAL | 0 refills | Status: DC

## 2019-10-07 NOTE — Progress Notes (Signed)
Pt ambulated in hallway with nurse, on portable O2 monitor. Patient able to walk 2 laps around unit keeps sats above 92% on RA. HR was elevated to 120s, pt reported some mild SOB. No distress noted. Pt states "it felt good to walk".

## 2019-10-07 NOTE — Progress Notes (Signed)
Occupational Therapy Treatment Patient Details Name: Christian Woodard MRN: [id number] DOB: February 27, 1946 Today's Date: 10/07/2019    History of present illness 73 y/o male w/ hx of DM and arthritis, back surgery. dx COVID + 11/03, presented to ED 11/06 w/ c/o SOB and cough, admitted with acute resp failure w/ hypoxia sec to COVID 19.   OT comments  Pt progressing towards established OT goal and planning for dc later today. Provided pt with handout and education on energy conservation for ADLs and IADLs. Discussed ways pt can implement strategies into his daily routine; pt verbalized understanding and agreement. Pt declining mobility and ADLs at this time as he walked with RN in hallway earlier. Continue to recommend dc to home with HHOT and provided education in preparation for dc.    Follow Up Recommendations  Home health OT;Supervision/Assistance - 24 hour    Equipment Recommendations  3 in 1 bedside commode    Recommendations for Other Services      Precautions / Restrictions Precautions Precautions: Fall Restrictions Weight Bearing Restrictions: No       Mobility Bed Mobility               General bed mobility comments: Pt in recliner upon arrival  Transfers                      Balance Overall balance assessment: Needs assistance Sitting-balance support: Feet unsupported Sitting balance-Leahy Scale: Good                                     ADL either performed or assessed with clinical judgement   ADL Overall ADL's : Needs assistance/impaired                                       General ADL Comments: Focused session on EC strategies for ADLs and IADLs. Pt verablizing understanding and verablized ways he plans to impliment EC into his routine.     Vision       Perception     Praxis      Cognition Arousal/Alertness: Awake/alert Behavior During Therapy: WFL for tasks assessed/performed Overall Cognitive  Status: Within Functional Limits for tasks assessed                                          Exercises     Shoulder Instructions       General Comments SpO2 >90% on RA    Pertinent Vitals/ Pain       Pain Assessment: No/denies pain  Home Living                                          Prior Functioning/Environment              Frequency  Min 2X/week(would benefit from 3x)        Progress Toward Goals  OT Goals(current goals can now be found in the care plan section)  Progress towards OT goals: Progressing toward goals  Acute Rehab OT Goals Patient Stated Goal: return to independence OT Goal Formulation: With patient Time For Goal  Achievement: 10/18/19 Potential to Achieve Goals: Good ADL Goals Pt/caregiver will Perform Home Exercise Program: Increased strength;With theraband;Both right and left upper extremity;With written HEP provided Additional ADL Goal #1: Pt will recall and apply ECS strategies to BADL Additional ADL Goal #2: Pt will self monitor and maintain O2 sats above 90% while engaging in BADL  Plan Discharge plan remains appropriate    Co-evaluation                 AM-PAC OT "6 Clicks" Daily Activity     Outcome Measure   Help from another person eating meals?: A Little Help from another person taking care of personal grooming?: A Little Help from another person toileting, which includes using toliet, bedpan, or urinal?: A Little Help from another person bathing (including washing, rinsing, drying)?: A Little Help from another person to put on and taking off regular upper body clothing?: A Little Help from another person to put on and taking off regular lower body clothing?: A Little 6 Click Score: 18    End of Session    OT Visit Diagnosis: Unsteadiness on feet (R26.81);Other abnormalities of gait and mobility (R26.89);Muscle weakness (generalized) (M62.81);Other (comment)(decreased cardiopulm  status)   Activity Tolerance Patient tolerated treatment well   Patient Left in chair;with call bell/phone within reach   Nurse Communication Mobility status        Time: [id number] OT Time Calculation (min): 12 min  Charges: OT General Charges $OT Visit: 1 Visit OT Treatments $Self Care/Home Management : 8-22 mins  [place], OTR/L Acute Rehab Pager: [phone] Office: [place] 10/07/2019, 12:00 PM

## 2019-10-07 NOTE — TOC Transition Note (Signed)
Transition of Care [facility]) - CM/SW Discharge Note   Patient Details  Name: Christian Woodard MRN: [id number] Date of Birth: November 21, 1946  Transition of [facility]) CM/SW Contact:  Ninfa Meeker, RN Phone Number: [phone] (working remotely) 10/07/2019, 1:16 PM   Clinical Narrative:  Case manager spoke with Patient's wife concerning discharge needs. Discussed choice for home health agency and DME. Referral called to Adela Lank, [facility]iaison. Huey Romans will provide DME. Case Manager contacted [facility], Kennyth Lose and requested 3in1 be delivered to patient's room.Patient will be transported home by his granddaughter.     Final next level of care: [place] Barriers to Discharge: No Barriers Identified   Patient Goals and CMS Choice Patient states their goals for this hospitalization and ongoing recovery are:: to get better CMS Medicare.gov Compare Post Acute Care list provided to:: Patient Represenative (must comment) Choice offered to / list presented to : Spouse  Discharge Placement                       Discharge Plan and Services   Discharge Planning Services: CM Consult Post Acute Care Choice: Durable Medical Equipment, Home Health          DME Arranged: 3-N-1 DME Agency: [place] Date DME Agency Contacted: 10/07/19 Time DME Agency Contacted: [id number] Representative spoke with at DME Agency: Learta Codding HH Arranged: PT, OT [facility] Agency: [place] Date [place]: 10/07/19 Time [place]: [id number] Representative spoke with at [place]: [place] ([place]) Interventions     Readmission Risk Interventions No flowsheet data found.

## 2019-10-07 NOTE — Discharge Summary (Signed)
Christian Woodard, is a 73 y.o. male  DOB [birth year]  MRN [id number].  Admission date:  10/03/2019  Admitting Physician  Albertine Patricia, MD  Discharge Date:  10/07/2019   Primary MD  Asencion Noble, MD  Recommendations for primary care physician for things to follow:  - please check CBC, CMP during next visit  Admission Diagnosis  AKI (acute kidney injury) ([place]) [N17.9] COVID-19 virus infection [U07.1]   Discharge Diagnosis  AKI (acute kidney injury) ([place]) [N17.9] COVID-19 virus infection [U07.1]    Principal Problem:   Acute respiratory failure with hypoxia ([place]) Active Problems:   Acute respiratory disease due to COVID-19 virus   Type 2 diabetes mellitus without complication ([place])   Essential hypertension      Past Medical History:  Diagnosis Date  . Arthritis   . Diabetes mellitus   . Hypercholesteremia     Past Surgical History:  Procedure Laterality Date  . BACK SURGERY  2000  . CHOLECYSTECTOMY    . COLONOSCOPY  09/15/2011   Dr. Gala Romney: Suboptimal prep. Pancolonic diverticulosis. Tubular adenomas. Next colonoscopy 5 years.  . COLONOSCOPY N/A 09/22/2016   Procedure: COLONOSCOPY;  Surgeon: Daneil Dolin, MD;  [facility];  Service: Endoscopy;  Laterality: N/A;  1100  . ESOPHAGOGASTRODUODENOSCOPY N/A 08/31/2017   Procedure: ESOPHAGOGASTRODUODENOSCOPY (EGD);  Surgeon: Daneil Dolin, MD;  [facility];  Service: Endoscopy;  Laterality: N/A;  11:15am  . GIVENS CAPSULE STUDY N/A 10/26/2017   Procedure: GIVENS CAPSULE STUDY;  Surgeon: Daneil Dolin, MD;  [facility];  Service: Endoscopy;  Laterality: N/A;  7:30am  . HERNIA REPAIR     as a child  . left cornea transplant Left   . Right cornea transplant    . TONSILLECTOMY         History of present illness and  Hospital Course:     Kindly see H&P for history of present illness and admission  details, please review complete Labs, Consult reports and Test reports for all details in brief  HPI  from the history and physical done on the day of admission 10/03/2019   HPI: Christian Woodard is a 73 y.o. male with a history of diabetes, hyperlipidemia, hypertension.  Patient seen for shortness of breath, cough, and weakness that started a couple days ago and gradually worsened.  He was tested 10 days ago and got results earlier this week for Covid.  He was tested due to an outbreak at his church where he goes and flulike symptoms with body aches, decreased appetite, nausea.  Nonproductive cough for the past 2 days that is worse with exertion and better with rest.  No fevers, diarrhea, or other GI complaints  Emergency Department Course: Oxygen saturation 90 to 92% and improved with nasal cannula.  Chest x-ray shows patchy infiltrates.  White count normal.  Hospital Course    Acute hypoxic respiratory failure due to COVID-19  pneumonia -This has resolved, he is on room air on discharge, ambulated in  the hallway where he sustained saturation more than 92% on room air, was encouraged to take his incentive spirometry flutter valve, and keep using with , rated with IV remdesivir x5 days during hospital stay, treated with Decadron, he will be discharged on another 5 days to finish total 10 days of treatment . -Chest x-ray significant for COVID-19 pneumonia. -Was encouraged to use incentive spirometry, flutter valve, and get out of bed to chair and to prone -Telemetry markers almost normalized   COVID-19 Labs  Recent Labs    10/05/19 0126 10/06/19 0511 10/07/19 0110  DDIMER 0.53* 0.57* 0.57*  CRP 2.6* 1.2* 1.0*    Lab Results  Component Value Date   SARSCOV2NAA Detected (A) 09/23/2019   [place] Not Detected 09/11/2019     Recent Labs       Lab Results  Component Value Date   SARSCOV2NAA Detected (A) 09/23/2019   [place] Not Detected 09/11/2019     Type 2  diabetes mellitus -Resume home regimen  AKI -Resolved  Hypertension -Continue with home medication  Hyperlipidemia -Continue with statin      Discharge Condition: stable   Follow UP  Follow-up Information    Asencion Noble, MD Follow up in 2 week(s).   Specialty: Internal Medicine Contact information: [address] [place] [address] [phone]             Discharge Instructions  and  Discharge Medications     Discharge Instructions    Discharge instructions   Complete by: As directed    Follow with Primary MD Asencion Noble, MD in 14 days   Get CBC, CMP,  checked  by Primary MD next visit.    Activity: As tolerated with Full fall precautions use walker/cane & assistance as needed   Disposition Home    Diet: Heart Healthy /Carb modified, with feeding assistance and aspiration precautions.  For Heart failure patients - Check your Weight same time everyday, if you gain over 2 pounds, or you develop in leg swelling, experience more shortness of breath or chest pain, call your Primary MD immediately. Follow Cardiac Low Salt Diet and 1.5 lit/day fluid restriction.   On your next visit with your primary care physician please Get Medicines reviewed and adjusted.   Please request your Prim.MD to go over all Hospital Tests and Procedure/Radiological results at the follow up, please get all Hospital records sent to your Prim MD by signing hospital release before you go home.   If you experience worsening of your admission symptoms, develop shortness of breath, life threatening emergency, suicidal or homicidal thoughts you must seek medical attention immediately by calling 911 or calling your MD immediately  if symptoms less severe.  You Must read complete instructions/literature along with all the possible adverse reactions/side effects for all the Medicines you take and that have been prescribed to you. Take any new Medicines after you have  completely understood and accpet all the possible adverse reactions/side effects.   Do not drive, operating heavy machinery, perform activities at heights, swimming or participation in water activities or provide baby sitting services if your were admitted for syncope or siezures until you have seen by Primary MD or a Neurologist and advised to do so again.  Do not drive when taking Pain medications.    Do not take more than prescribed Pain, Sleep and Anxiety Medications  Special Instructions: If you have smoked or chewed Tobacco  in the last 2 yrs please stop smoking, stop any regular Alcohol  and or any Recreational drug use.  Wear Seat belts while driving.   Please note  You were cared for by a hospitalist during your hospital stay. If you have any questions about your discharge medications or the care you received while you were in the hospital after you are discharged, you can call the unit and asked to speak with the hospitalist on call if the hospitalist that took care of you is not available. Once you are discharged, your primary care physician will handle any further medical issues. Please note that NO REFILLS for any discharge medications will be authorized once you are discharged, as it is imperative that you return to your primary care physician (or establish a relationship with a primary care physician if you do not have one) for your aftercare needs so that they can reassess your need for medications and monitor your lab values.   Increase activity slowly   Complete by: As directed      Allergies as of 10/07/2019   No Known Allergies     Medication List    STOP taking these medications   acetaminophen 500 MG tablet Commonly known as: TYLENOL     TAKE these medications   aspirin 81 MG tablet Take 81 mg by mouth daily.   atorvastatin 10 MG tablet Commonly known as: LIPITOR Take 10 mg by mouth at bedtime.   dexamethasone 6 MG tablet Commonly known as:  DECADRON Take 1 tablet (6 mg total) by mouth daily. Start taking on: October 08, 2019   famotidine 20 MG tablet Commonly known as: PEPCID Take 20 mg by mouth at bedtime.   ferrous sulfate 325 (65 FE) MG tablet Take 325 mg 2 (two) times daily by mouth.   glipiZIDE 5 MG 24 hr tablet Commonly known as: GLUCOTROL XL Take 5 mg by mouth daily with breakfast.   Lantus SoloStar 100 UNIT/ML Solostar Pen Generic drug: Insulin Glargine Inject 14 Units into the skin daily. At bedtime   losartan 50 MG tablet Commonly known as: COZAAR Take 50 mg by mouth daily.   metFORMIN 1000 MG tablet Commonly known as: GLUCOPHAGE Take 1,000 mg by mouth 2 (two) times daily.   pantoprazole 40 MG tablet Commonly known as: PROTONIX TAKE 1 TABLET BY MOUTH ONCE DAILY 30  TO  60  MINUTES  BEFORE  BREAKFAST   pioglitazone 45 MG tablet Commonly known as: ACTOS Take 45 mg by mouth daily.         Diet and Activity recommendation: See Discharge Instructions above     Major procedures and Radiology Reports - PLEASE review detailed and final reports for all details, in brief -     Dg Chest Portable 1 View  Result Date: 10/03/2019 CLINICAL DATA:  Patient with COVID 19. Intermittent shortness of breath. EXAM: PORTABLE CHEST 1 VIEW COMPARISON:  Chest radiograph 04/03/2017. FINDINGS: Monitoring leads overlie the patient. Stable enlarged cardiac and mediastinal contours. Aortic atherosclerosis. Interval development of mild patchy peripheral consolidative opacities. No pleural effusion or pneumothorax. Osseous structures unremarkable. Cholecystectomy clips. IMPRESSION: New mild bilateral peripheral patchy opacities likely sequelae of atypical viral infection. Electronically Signed   By: Lovey Newcomer M.D.   On: 10/03/2019 13:15    Micro Results     No results found for this or any previous visit (from the past 240 hour(s)).     Today   Subjective:   [name] today has no headache,no chest  abdominal pain,no new weakness tingling or numbness, feels much better  wants to go home today.   Objective:   Blood pressure 131/75, pulse 62, temperature (!) 97.4 F (36.3 C), temperature source Axillary, resp. rate 19, height 5\' 6"  (1.676 m), weight 74.8 kg, SpO2 91 %.   Intake/Output Summary (Last 24 hours) at 10/07/2019 1058 Last data filed at 10/06/2019 1922 Gross per 24 hour  Intake 840 ml  Output 450 ml  Net 390 ml    Exam Awake Alert, Oriented x 3, No new F.N deficits, Normal affect Symmetrical Chest wall movement, Good air movement bilaterally, CTAB RRR,No Gallops,Rubs or new Murmurs, No Parasternal Heave +ve B.Sounds, Abd Soft, Non tender,No rebound -guarding or rigidity. No Cyanosis, Clubbing or edema, No new Rash or bruise  Data Review   CBC w Diff:  Lab Results  Component Value Date   WBC 7.9 10/07/2019   HGB 12.7 (L) 10/07/2019   HCT 39.8 10/07/2019   PLT 364 10/07/2019   LYMPHOPCT 8 10/07/2019   MONOPCT 11 10/07/2019   EOSPCT 0 10/07/2019   BASOPCT 0 10/07/2019    CMP:  Lab Results  Component Value Date   NA 143 10/07/2019   K 3.4 (L) 10/07/2019   CL 108 10/07/2019   CO2 21 (L) 10/07/2019   BUN 22 10/07/2019   CREATININE 1.02 10/07/2019   PROT 7.0 10/07/2019   ALBUMIN 3.4 (L) 10/07/2019   BILITOT 0.9 10/07/2019   ALKPHOS 58 10/07/2019   AST 43 (H) 10/07/2019   ALT 37 10/07/2019  .   Total Time in preparing paper work, data evaluation and todays exam - 7 minutes  Phillips Climes M.D on 10/07/2019 at 10:58 AM  [facility]   Office  [phone]

## 2019-10-07 NOTE — Progress Notes (Signed)
Inpatient Diabetes Program Recommendations  AACE/ADA: New Consensus Statement on Inpatient Glycemic Control   Target Ranges:  Prepandial:   less than 140 mg/dL      Peak postprandial:   less than 180 mg/dL (1-2 hours)      Critically ill patients:  140 - 180 mg/dL  Results for RENAULT, NILE (MRN [id number]) as of 10/07/2019 09:15  Ref. Range 10/07/2019 01:10  Glucose Latest Ref Range: 70 - 99 mg/dL 129 (H)   Results for DAEQUON, ADRIANCE (MRN [id number]) as of 10/07/2019 09:15  Ref. Range 10/06/2019 02:50 10/06/2019 07:34 10/06/2019 11:42 10/06/2019 16:16 10/06/2019 20:29  Glucose-Capillary Latest Ref Range: 70 - 99 mg/dL 132 (H) 109 (H) 312 (H) 353 (H) 224 (H)    Review of Glycemic Control  Diabetes history: DM2 Outpatient Diabetes medications: Lantus 14 units QHS, Glipizide 5 mg QAM, Metformin 1000 mg BID, Actos 45 mg daily Current orders for Inpatient glycemic control: Lantus 20 units daily, Novolog 0-20 units TID with meals, Novolog 0-5 units QHS, Novolog 4 units TID with meals; Decadron 6 mg daily  Inpatient Diabetes Program Recommendations:   Insulin-Meal Coverage: If steroids are continued, please consider increasing meal coverage to Novolog 8 units TID with meals if patient eats at least 50% of meals.  Thanks, Barnie Alderman, RN, MSN, CDE Diabetes Coordinator Inpatient Diabetes Program [phone] (Team Pager from 8am to 5pm)

## 2019-10-07 NOTE — Discharge Instructions (Signed)
Person Under Monitoring Name: Christian Woodard  Location: [address] [place] [address]   Infection Prevention Recommendations for Individuals Confirmed to have, or Being Evaluated for, 2019 Novel Coronavirus (COVID-19) Infection Who Receive Care at Home  Individuals who are confirmed to have, or are being evaluated for, COVID-19 should follow the prevention steps below until a healthcare provider or local or state health department says they can return to normal activities.  Stay home except to get medical care You should restrict activities outside your home, except for getting medical care. Do not go to work, school, or public areas, and do not use public transportation or taxis.  Call ahead before visiting your doctor Before your medical appointment, call the healthcare provider and tell them that you have, or are being evaluated for, COVID-19 infection. This will help the healthcare providers office take steps to keep other people from getting infected. Ask your healthcare provider to call the local or state health department.  Monitor your symptoms Seek prompt medical attention if your illness is worsening (e.g., difficulty breathing). Before going to your medical appointment, call the healthcare provider and tell them that you have, or are being evaluated for, COVID-19 infection. Ask your healthcare provider to call the local or state health department.  Wear a facemask You should wear a facemask that covers your nose and mouth when you are in the same room with other people and when you visit a healthcare provider. People who live with or visit you should also wear a facemask while they are in the same room with you.  Separate yourself from other people in your home As much as possible, you should stay in a different room from other people in your home. Also, you should use a separate bathroom, if available.  Avoid sharing household items You should not  share dishes, drinking glasses, cups, eating utensils, towels, bedding, or other items with other people in your home. After using these items, you should wash them thoroughly with soap and water.  Cover your coughs and sneezes Cover your mouth and nose with a tissue when you cough or sneeze, or you can cough or sneeze into your sleeve. Throw used tissues in a lined trash can, and immediately wash your hands with soap and water for at least 20 seconds or use an alcohol-based hand rub.  Wash your [facility] your hands often and thoroughly with soap and water for at least 20 seconds. You can use an alcohol-based hand sanitizer if soap and water are not available and if your hands are not visibly dirty. Avoid touching your eyes, nose, and mouth with unwashed hands.   Prevention Steps for Caregivers and Household Members of Individuals Confirmed to have, or Being Evaluated for, COVID-19 Infection Being Cared for in the Home  If you live with, or provide care at home for, a person confirmed to have, or being evaluated for, COVID-19 infection please follow these guidelines to prevent infection:  Follow healthcare providers instructions Make sure that you understand and can help the patient follow any healthcare provider instructions for all care.  Provide for the patients basic needs You should help the patient with basic needs in the home and provide support for getting groceries, prescriptions, and other personal needs.  Monitor the patients symptoms If they are getting sicker, call his or her medical provider and tell them that the patient has, or is being evaluated for, COVID-19 infection. This will help the healthcare  providers office take steps to keep other people from getting infected. Ask the healthcare provider to call the local or state health department.  Limit the number of people who have contact with the patient  If possible, have only one caregiver for the  patient.  Other household members should stay in another home or place of residence. If this is not possible, they should stay  in another room, or be separated from the patient as much as possible. Use a separate bathroom, if available.  Restrict visitors who do not have an essential need to be in the home.  Keep older adults, very young children, and other sick people away from the patient Keep older adults, very young children, and those who have compromised immune systems or chronic health conditions away from the patient. This includes people with chronic heart, lung, or kidney conditions, diabetes, and cancer.  Ensure good ventilation Make sure that shared spaces in the home have good air flow, such as from an air conditioner or an opened window, weather permitting.  Wash your hands often  Wash your hands often and thoroughly with soap and water for at least 20 seconds. You can use an alcohol based hand sanitizer if soap and water are not available and if your hands are not visibly dirty.  Avoid touching your eyes, nose, and mouth with unwashed hands.  Use disposable paper towels to dry your hands. If not available, use dedicated cloth towels and replace them when they become wet.  Wear a facemask and gloves  Wear a disposable facemask at all times in the room and gloves when you touch or have contact with the patients blood, body fluids, and/or secretions or excretions, such as sweat, saliva, sputum, nasal mucus, vomit, urine, or feces.  Ensure the mask fits over your nose and mouth tightly, and do not touch it during use.  Throw out disposable facemasks and gloves after using them. Do not reuse.  Wash your hands immediately after removing your facemask and gloves.  If your personal clothing becomes contaminated, carefully remove clothing and launder. Wash your hands after handling contaminated clothing.  Place all used disposable facemasks, gloves, and other waste in a lined  container before disposing them with other household waste.  Remove gloves and wash your hands immediately after handling these items.  Do not share dishes, glasses, or other household items with the patient  Avoid sharing household items. You should not share dishes, drinking glasses, cups, eating utensils, towels, bedding, or other items with a patient who is confirmed to have, or being evaluated for, COVID-19 infection.  After the person uses these items, you should wash them thoroughly with soap and water.  Wash laundry thoroughly  Immediately remove and wash clothes or bedding that have blood, body fluids, and/or secretions or excretions, such as sweat, saliva, sputum, nasal mucus, vomit, urine, or feces, on them.  Wear gloves when handling laundry from the patient.  Read and follow directions on labels of laundry or clothing items and detergent. In general, wash and dry with the warmest temperatures recommended on the label.  Clean all areas the individual has used often  Clean all touchable surfaces, such as counters, tabletops, doorknobs, bathroom fixtures, toilets, phones, keyboards, tablets, and bedside tables, every day. Also, clean any surfaces that may have blood, body fluids, and/or secretions or excretions on them.  Wear gloves when cleaning surfaces the patient has come in contact with.  Use a diluted bleach solution (e.g., dilute bleach with  1 part bleach and 10 parts water) or a household disinfectant with a label that says EPA-registered for coronaviruses. To make a bleach solution at home, add 1 tablespoon of bleach to 1 quart (4 cups) of water. For a larger supply, add  cup of bleach to 1 gallon (16 cups) of water.  Read labels of cleaning products and follow recommendations provided on product labels. Labels contain instructions for safe and effective use of the cleaning product including precautions you should take when applying the product, such as wearing gloves or  eye protection and making sure you have good ventilation during use of the product.  Remove gloves and wash hands immediately after cleaning.  Monitor yourself for signs and symptoms of illness Caregivers and household members are considered close contacts, should monitor their health, and will be asked to limit movement outside of the home to the extent possible. Follow the monitoring steps for close contacts listed on the symptom monitoring form.   ? If you have additional questions, contact your local health department or call the epidemiologist on call at [phone] (available 24/7). ? This guidance is subject to change. For the most up-to-date guidance from [facility], please refer to their website: YouBlogs.pl

## 2019-10-07 NOTE — Progress Notes (Signed)
Per patients request all discharge materials and instructions were explained to granddaughter Christian Woodard via phone. All questions were answered and arrangements made for Christian Woodard to come pick up patient. Case management also set up [facility] visits and spoke with wife, see note. Patient prepared for discharge and all belongings collected from room.

## 2019-10-10 DIAGNOSIS — M47819 Spondylosis without myelopathy or radiculopathy, site unspecified: Secondary | ICD-10-CM | POA: Diagnosis not present

## 2019-10-10 DIAGNOSIS — E785 Hyperlipidemia, unspecified: Secondary | ICD-10-CM | POA: Diagnosis not present

## 2019-10-10 DIAGNOSIS — D649 Anemia, unspecified: Secondary | ICD-10-CM | POA: Diagnosis not present

## 2019-10-10 DIAGNOSIS — J1289 Other viral pneumonia: Secondary | ICD-10-CM | POA: Diagnosis not present

## 2019-10-10 DIAGNOSIS — E7800 Pure hypercholesterolemia, unspecified: Secondary | ICD-10-CM | POA: Diagnosis not present

## 2019-10-10 DIAGNOSIS — E1121 Type 2 diabetes mellitus with diabetic nephropathy: Secondary | ICD-10-CM | POA: Diagnosis not present

## 2019-10-10 DIAGNOSIS — I700 Atherosclerosis of aorta: Secondary | ICD-10-CM | POA: Diagnosis not present

## 2019-10-10 DIAGNOSIS — U071 COVID-19: Secondary | ICD-10-CM | POA: Diagnosis not present

## 2019-10-10 DIAGNOSIS — I119 Hypertensive heart disease without heart failure: Secondary | ICD-10-CM | POA: Diagnosis not present

## 2019-10-14 DIAGNOSIS — M47819 Spondylosis without myelopathy or radiculopathy, site unspecified: Secondary | ICD-10-CM | POA: Diagnosis not present

## 2019-10-14 DIAGNOSIS — I700 Atherosclerosis of aorta: Secondary | ICD-10-CM | POA: Diagnosis not present

## 2019-10-14 DIAGNOSIS — E7800 Pure hypercholesterolemia, unspecified: Secondary | ICD-10-CM | POA: Diagnosis not present

## 2019-10-14 DIAGNOSIS — J1289 Other viral pneumonia: Secondary | ICD-10-CM | POA: Diagnosis not present

## 2019-10-14 DIAGNOSIS — E1121 Type 2 diabetes mellitus with diabetic nephropathy: Secondary | ICD-10-CM | POA: Diagnosis not present

## 2019-10-14 DIAGNOSIS — I119 Hypertensive heart disease without heart failure: Secondary | ICD-10-CM | POA: Diagnosis not present

## 2019-10-14 DIAGNOSIS — U071 COVID-19: Secondary | ICD-10-CM | POA: Diagnosis not present

## 2019-10-14 DIAGNOSIS — D649 Anemia, unspecified: Secondary | ICD-10-CM | POA: Diagnosis not present

## 2019-10-14 DIAGNOSIS — E785 Hyperlipidemia, unspecified: Secondary | ICD-10-CM | POA: Diagnosis not present

## 2019-10-16 DIAGNOSIS — M47819 Spondylosis without myelopathy or radiculopathy, site unspecified: Secondary | ICD-10-CM | POA: Diagnosis not present

## 2019-10-16 DIAGNOSIS — D649 Anemia, unspecified: Secondary | ICD-10-CM | POA: Diagnosis not present

## 2019-10-16 DIAGNOSIS — E785 Hyperlipidemia, unspecified: Secondary | ICD-10-CM | POA: Diagnosis not present

## 2019-10-16 DIAGNOSIS — U071 COVID-19: Secondary | ICD-10-CM | POA: Diagnosis not present

## 2019-10-16 DIAGNOSIS — E1121 Type 2 diabetes mellitus with diabetic nephropathy: Secondary | ICD-10-CM | POA: Diagnosis not present

## 2019-10-16 DIAGNOSIS — J1289 Other viral pneumonia: Secondary | ICD-10-CM | POA: Diagnosis not present

## 2019-10-16 DIAGNOSIS — I119 Hypertensive heart disease without heart failure: Secondary | ICD-10-CM | POA: Diagnosis not present

## 2019-10-16 DIAGNOSIS — I700 Atherosclerosis of aorta: Secondary | ICD-10-CM | POA: Diagnosis not present

## 2019-10-16 DIAGNOSIS — E7800 Pure hypercholesterolemia, unspecified: Secondary | ICD-10-CM | POA: Diagnosis not present

## 2019-10-21 DIAGNOSIS — E7800 Pure hypercholesterolemia, unspecified: Secondary | ICD-10-CM | POA: Diagnosis not present

## 2019-10-21 DIAGNOSIS — I119 Hypertensive heart disease without heart failure: Secondary | ICD-10-CM | POA: Diagnosis not present

## 2019-10-21 DIAGNOSIS — E1121 Type 2 diabetes mellitus with diabetic nephropathy: Secondary | ICD-10-CM | POA: Diagnosis not present

## 2019-10-21 DIAGNOSIS — U071 COVID-19: Secondary | ICD-10-CM | POA: Diagnosis not present

## 2019-10-21 DIAGNOSIS — I700 Atherosclerosis of aorta: Secondary | ICD-10-CM | POA: Diagnosis not present

## 2019-10-21 DIAGNOSIS — D649 Anemia, unspecified: Secondary | ICD-10-CM | POA: Diagnosis not present

## 2019-10-21 DIAGNOSIS — J1289 Other viral pneumonia: Secondary | ICD-10-CM | POA: Diagnosis not present

## 2019-10-21 DIAGNOSIS — E785 Hyperlipidemia, unspecified: Secondary | ICD-10-CM | POA: Diagnosis not present

## 2019-10-21 DIAGNOSIS — M47819 Spondylosis without myelopathy or radiculopathy, site unspecified: Secondary | ICD-10-CM | POA: Diagnosis not present

## 2019-10-21 DIAGNOSIS — E1129 Type 2 diabetes mellitus with other diabetic kidney complication: Secondary | ICD-10-CM | POA: Diagnosis not present

## 2019-10-22 DIAGNOSIS — U071 COVID-19: Secondary | ICD-10-CM | POA: Diagnosis not present

## 2019-10-24 DIAGNOSIS — U071 COVID-19: Secondary | ICD-10-CM | POA: Diagnosis not present

## 2019-10-24 DIAGNOSIS — M47819 Spondylosis without myelopathy or radiculopathy, site unspecified: Secondary | ICD-10-CM | POA: Diagnosis not present

## 2019-10-24 DIAGNOSIS — E7800 Pure hypercholesterolemia, unspecified: Secondary | ICD-10-CM | POA: Diagnosis not present

## 2019-10-24 DIAGNOSIS — D649 Anemia, unspecified: Secondary | ICD-10-CM | POA: Diagnosis not present

## 2019-10-24 DIAGNOSIS — E1121 Type 2 diabetes mellitus with diabetic nephropathy: Secondary | ICD-10-CM | POA: Diagnosis not present

## 2019-10-24 DIAGNOSIS — I119 Hypertensive heart disease without heart failure: Secondary | ICD-10-CM | POA: Diagnosis not present

## 2019-10-24 DIAGNOSIS — I700 Atherosclerosis of aorta: Secondary | ICD-10-CM | POA: Diagnosis not present

## 2019-10-24 DIAGNOSIS — E785 Hyperlipidemia, unspecified: Secondary | ICD-10-CM | POA: Diagnosis not present

## 2019-10-24 DIAGNOSIS — J1289 Other viral pneumonia: Secondary | ICD-10-CM | POA: Diagnosis not present

## 2019-10-27 ENCOUNTER — Other Ambulatory Visit: Payer: Self-pay

## 2019-10-27 ENCOUNTER — Ambulatory Visit (INDEPENDENT_AMBULATORY_CARE_PROVIDER_SITE_OTHER): Payer: Medicare HMO | Admitting: Internal Medicine

## 2019-10-27 DIAGNOSIS — R0600 Dyspnea, unspecified: Secondary | ICD-10-CM

## 2019-10-27 DIAGNOSIS — R0609 Other forms of dyspnea: Secondary | ICD-10-CM

## 2019-10-27 NOTE — Progress Notes (Signed)
Subjective:     Patient ID: Christian Woodard, male   DOB: [birth year]     MRN: [id number]    Brief patient profile:  63 yowm quit smoking 2000 with mild doe @ wt 156  and did fine until around 2013 gave up pushing mower and gradually downhill since referred to pulmonary clinic 06/14/2017 by Dr   Willey Blade   06/14/2017 1st [facility] office visit/ Christian Woodard  No resp rx  Chief Complaint  Patient presents with  . Pulmonary Consult    Referred by Dr. Asencion Noble. Pt c/o SOB x 6 months, esp worse over the past 3 months. He states he gets out of breath walking an incline, such as to his mailbox. He states that he sometimes he wakes up in the night feeling SOB.  He also c/o non prod cough.   doe x 5 years,  Stops at top of incline on way back to house x 6 months  = MMRC1 = can walk nl pace, flat grade, can't hurry or go uphills or steps s sob   Never tried inhalers Worse in heat and humbidity  freq wakes with dry cough / chest tightness sits up and goes away  rec You do not have significant copd at this point and likely never will  Pantoprazole (protonix) 40 mg   Take  30-60 min before first meal of the day and Pepcid (famotidine)  20 mg one @  bedtime until return to office - this is the best way to tell whether stomach acid is contributing to your problem.   GERD diet     08/20/2017  f/u ov/Christian Woodard re: doe on gerd rx/ gi eval in progress for anemia/ no resp rx  Chief Complaint  Patient presents with  . Follow-up    PFT's done today. Increased SOB for the past day. Chest feels heavy today.  continues to have noct spells of sob despite nl BNP and no evidence of chf on cxr or obvious anemia Tells me his hgb still tredning down and GI eval in progess rec No f/u prn   Admission date:  10/03/2019    Discharge Date:  10/07/2019  Discharge Diagnosis  AKI (acute kidney injury) ([place]) [N17.9] COVID-19 virus infection [U07.1]     Acute respiratory failure with hypoxia ([place])   Acute respiratory disease  due to COVID-19 virus   Type 2 diabetes mellitus without complication ([place])   Essential hypertension    [id number] I Markhamis a 73 y.o.malewith a history of diabetes, hyperlipidemia, hypertension. Patient seen for shortness of breath, cough, and weakness that started a couple days PTA and gradually worsened. He was tested 10 days PTA and got results earlier this week for Covid. He was tested due to an outbreak at his church where he goes and flulike symptoms with body aches, decreased appetite, nausea. Nonproductive cough for the past 2 days that is worse with exertion and better with rest.   Hospital Course  Acute hypoxic respiratory failure due to COVID-19 pneumonia -This has resolved, he is on room air on discharge, ambulated in the hallway where he sustained saturation more than 92% on room air, was encouraged to take his incentive spirometry flutter valve, and keep using with , rated with IV remdesivir x5 days during hospital stay, treated with Decadron, he will be discharged on another 5 days to finish total 10 days of treatment . -Chest x-ray significant for COVID-19 pneumonia. -Was encouraged to use incentive spirometry, flutter valve, and get out of  bed to chair and to prone  Virtual Visit via Telephone Note 10/27/2019   Post covid  I connected with Christian Woodard on 10/27/19 at  2:20 PM EST(did not reach on 1st attempt) by telephone and verified that I am speaking with the correct person using two identifiers.   I discussed the limitations, risks, security and privacy concerns of performing an evaluation and management service by telephone and the availability of in person appointments. I also discussed with the patient that there may be a patient responsible charge related to this service. The patient expressed understanding and agreed to proceed.    History of Present Illness: Dyspnea:  2 laps off 02 prior to discharge / trend is improving at home but not checking sats   Cough: minimal clear  Sleeping: flat/ one pillow SABA use: none 02: none  Dex last day 10/12/2019   No obvious day to day or daytime variability or assoc excess/ purulent sputum or mucus plugs or hemoptysis or cp or chest tightness, subjective wheeze or overt sinus or hb symptoms.    Also denies any obvious fluctuation of symptoms with weather or environmental changes or other aggravating or alleviating factors except as outlined above.   Meds reviewed/ med reconciliation completed         Observations/Objective: Sounds good on the phone, no sob, good phonation, n coughing    Assessment and Plan: See problem list for active a/p's   Follow Up Instructions: See avs for instructions unique to this ov which includes revised/ updated med list     I discussed the assessment and treatment plan with the patient. The patient was provided an opportunity to ask questions and all were answered. The patient agreed with the plan and demonstrated an understanding of the instructions.   The patient was advised to call back or seek an in-person evaluation if the symptoms worsen or if the condition fails to improve as anticipated.  I provided 25  minutes of non-face-to-face time during this encounter.   Christinia Gully, MD

## 2019-10-27 NOTE — Assessment & Plan Note (Addendum)
Onset  2013  Spirometry 06/14/2017  FEV1 1.91 (70%)  Ratio 73 mild curvature  -  06/14/2017  Walked RA x 3 laps @ 185 ft each stopped due to  End of study, sats 89% and sob @ fast pace  - Allergy profile 06/14/17 >  Eos 0.1/  IgE    38 neg RAST  - PFT's  08/20/2017  FEV1 2.23 (84 % ) ratio 76  p 2 % improvement from saba p nothing prior to study with DLCO  45/55c % corrects to 78 % for alv volume   - Flared with COVID19 pna 09/2019   Making steady progress s regression off dex s/p d/c but needs to continue to monitor his ex tol/ sats and call if not completely back to prior baseline p 3 months with f/u cxr pending per Dr Willey Blade   His baseline lung function was excellent which served him well as would be expected despite his age of 76 y at time of COVID 19/ reviewed details of admit and d/c plans with him in detail.  Pulmonary f/u can therefor be prn   see avs for instructions unique to this ov     Each maintenance medication was reviewed in detail including most importantly the difference between maintenance and as needed and under what circumstances the prns are to be used.  Please see AVS for specific  Instructions which are unique to this visit and I personally typed out  which were reviewed in detail over the phone with the patient and a copy provided via [place].

## 2019-10-27 NOTE — Patient Instructions (Addendum)
Make sure you check your oxygen saturations at highest level of activity to be sure it stays over 90% and call if the pattern is deteriorating   Dr Willey Blade will need to do a follow up cxr and refer you back to [place] if needed but I believe your outlook is toward getting completely back to where you were before

## 2019-10-28 ENCOUNTER — Encounter: Payer: Self-pay | Admitting: Internal Medicine

## 2019-10-31 DIAGNOSIS — E1121 Type 2 diabetes mellitus with diabetic nephropathy: Secondary | ICD-10-CM | POA: Diagnosis not present

## 2019-10-31 DIAGNOSIS — M47819 Spondylosis without myelopathy or radiculopathy, site unspecified: Secondary | ICD-10-CM | POA: Diagnosis not present

## 2019-10-31 DIAGNOSIS — E785 Hyperlipidemia, unspecified: Secondary | ICD-10-CM | POA: Diagnosis not present

## 2019-10-31 DIAGNOSIS — I700 Atherosclerosis of aorta: Secondary | ICD-10-CM | POA: Diagnosis not present

## 2019-10-31 DIAGNOSIS — J1289 Other viral pneumonia: Secondary | ICD-10-CM | POA: Diagnosis not present

## 2019-10-31 DIAGNOSIS — D649 Anemia, unspecified: Secondary | ICD-10-CM | POA: Diagnosis not present

## 2019-10-31 DIAGNOSIS — E7800 Pure hypercholesterolemia, unspecified: Secondary | ICD-10-CM | POA: Diagnosis not present

## 2019-10-31 DIAGNOSIS — U071 COVID-19: Secondary | ICD-10-CM | POA: Diagnosis not present

## 2019-10-31 DIAGNOSIS — I119 Hypertensive heart disease without heart failure: Secondary | ICD-10-CM | POA: Diagnosis not present

## 2019-11-26 DIAGNOSIS — E1129 Type 2 diabetes mellitus with other diabetic kidney complication: Secondary | ICD-10-CM | POA: Diagnosis not present

## 2019-12-03 DIAGNOSIS — E1129 Type 2 diabetes mellitus with other diabetic kidney complication: Secondary | ICD-10-CM | POA: Diagnosis not present

## 2019-12-03 DIAGNOSIS — U071 COVID-19: Secondary | ICD-10-CM | POA: Diagnosis not present

## 2020-01-26 DIAGNOSIS — Z01818 Encounter for other preprocedural examination: Secondary | ICD-10-CM | POA: Diagnosis not present

## 2020-01-28 DIAGNOSIS — Z7984 Long term (current) use of oral hypoglycemic drugs: Secondary | ICD-10-CM | POA: Diagnosis not present

## 2020-01-28 DIAGNOSIS — Z947 Corneal transplant status: Secondary | ICD-10-CM | POA: Diagnosis not present

## 2020-01-28 DIAGNOSIS — Z9049 Acquired absence of other specified parts of digestive tract: Secondary | ICD-10-CM | POA: Diagnosis not present

## 2020-01-28 DIAGNOSIS — K228 Other specified diseases of esophagus: Secondary | ICD-10-CM | POA: Diagnosis not present

## 2020-01-28 DIAGNOSIS — K22710 Barrett's esophagus with low grade dysplasia: Secondary | ICD-10-CM | POA: Diagnosis not present

## 2020-01-28 DIAGNOSIS — K449 Diaphragmatic hernia without obstruction or gangrene: Secondary | ICD-10-CM | POA: Diagnosis not present

## 2020-01-28 DIAGNOSIS — E119 Type 2 diabetes mellitus without complications: Secondary | ICD-10-CM | POA: Diagnosis not present

## 2020-01-28 DIAGNOSIS — M1990 Unspecified osteoarthritis, unspecified site: Secondary | ICD-10-CM | POA: Diagnosis not present

## 2020-01-28 DIAGNOSIS — E785 Hyperlipidemia, unspecified: Secondary | ICD-10-CM | POA: Diagnosis not present

## 2020-01-28 DIAGNOSIS — K2270 Barrett's esophagus without dysplasia: Secondary | ICD-10-CM | POA: Diagnosis not present

## 2020-02-26 ENCOUNTER — Ambulatory Visit: Payer: Medicare HMO | Attending: Internal Medicine

## 2020-02-26 DIAGNOSIS — Z23 Encounter for immunization: Secondary | ICD-10-CM

## 2020-02-26 NOTE — Progress Notes (Signed)
   Covid-19 Vaccination Clinic  Name:  Christian Woodard    MRN: [id number] DOB: 05-30-1946  02/26/2020  Christian Woodard was observed post Covid-19 immunization for 15 minutes without incident. He was provided with Vaccine Information Sheet and instruction to access the V-Safe system.   Christian Woodard was instructed to call 911 with any severe reactions post vaccine: Marland Kitchen Difficulty breathing  . Swelling of face and throat  . A fast heartbeat  . A bad rash all over body  . Dizziness and weakness   Immunizations Administered    Name Date Dose VIS Date Route   Moderna COVID-19 Vaccine 02/26/2020 12:13 PM 0.5 mL 10/28/2019 Intramuscular   Manufacturer: Moderna   Lot: [id number]   [id number]

## 2020-03-01 DIAGNOSIS — E1129 Type 2 diabetes mellitus with other diabetic kidney complication: Secondary | ICD-10-CM | POA: Diagnosis not present

## 2020-03-01 DIAGNOSIS — U071 COVID-19: Secondary | ICD-10-CM | POA: Diagnosis not present

## 2020-03-01 DIAGNOSIS — E785 Hyperlipidemia, unspecified: Secondary | ICD-10-CM | POA: Diagnosis not present

## 2020-03-01 DIAGNOSIS — K2270 Barrett's esophagus without dysplasia: Secondary | ICD-10-CM | POA: Diagnosis not present

## 2020-03-01 DIAGNOSIS — J449 Chronic obstructive pulmonary disease, unspecified: Secondary | ICD-10-CM | POA: Diagnosis not present

## 2020-03-01 DIAGNOSIS — Z79899 Other long term (current) drug therapy: Secondary | ICD-10-CM | POA: Diagnosis not present

## 2020-03-05 DIAGNOSIS — E785 Hyperlipidemia, unspecified: Secondary | ICD-10-CM | POA: Diagnosis not present

## 2020-03-05 DIAGNOSIS — E1129 Type 2 diabetes mellitus with other diabetic kidney complication: Secondary | ICD-10-CM | POA: Diagnosis not present

## 2020-03-30 ENCOUNTER — Ambulatory Visit: Payer: Medicare HMO | Attending: Internal Medicine

## 2020-03-30 DIAGNOSIS — Z23 Encounter for immunization: Secondary | ICD-10-CM

## 2020-03-30 NOTE — Progress Notes (Signed)
   Covid-19 Vaccination Clinic  Name:  Christian Woodard    MRN: [id number] DOB: 01-21-1946  03/30/2020  [name] was observed post Covid-19 immunization for 15 minutes without incident. He was provided with Vaccine Information Sheet and instruction to access the V-Safe system.   [name] was instructed to call 911 with any severe reactions post vaccine: Marland Kitchen Difficulty breathing  . Swelling of face and throat  . A fast heartbeat  . A bad rash all over body  . Dizziness and weakness   Immunizations Administered    Name Date Dose VIS Date Route   Moderna COVID-19 Vaccine 03/30/2020 12:14 PM 0.5 mL 10/2019 Intramuscular   Manufacturer: Moderna   Lot: [id number]   [id number]

## 2020-04-12 DIAGNOSIS — H524 Presbyopia: Secondary | ICD-10-CM | POA: Diagnosis not present

## 2020-04-22 DIAGNOSIS — H26491 Other secondary cataract, right eye: Secondary | ICD-10-CM | POA: Diagnosis not present

## 2020-06-01 DIAGNOSIS — E1129 Type 2 diabetes mellitus with other diabetic kidney complication: Secondary | ICD-10-CM | POA: Diagnosis not present

## 2020-06-07 DIAGNOSIS — M65849 Other synovitis and tenosynovitis, unspecified hand: Secondary | ICD-10-CM | POA: Diagnosis not present

## 2020-06-07 DIAGNOSIS — E1129 Type 2 diabetes mellitus with other diabetic kidney complication: Secondary | ICD-10-CM | POA: Diagnosis not present

## 2020-06-07 DIAGNOSIS — Z6828 Body mass index (BMI) 28.0-28.9, adult: Secondary | ICD-10-CM | POA: Diagnosis not present

## 2020-06-07 DIAGNOSIS — J449 Chronic obstructive pulmonary disease, unspecified: Secondary | ICD-10-CM | POA: Diagnosis not present

## 2020-09-10 ENCOUNTER — Other Ambulatory Visit: Payer: Self-pay | Admitting: Gastroenterology

## 2020-10-01 DIAGNOSIS — E1129 Type 2 diabetes mellitus with other diabetic kidney complication: Secondary | ICD-10-CM | POA: Diagnosis not present

## 2020-10-08 DIAGNOSIS — Z23 Encounter for immunization: Secondary | ICD-10-CM | POA: Diagnosis not present

## 2020-10-08 DIAGNOSIS — R7309 Other abnormal glucose: Secondary | ICD-10-CM | POA: Diagnosis not present

## 2020-10-08 DIAGNOSIS — R0602 Shortness of breath: Secondary | ICD-10-CM | POA: Diagnosis not present

## 2020-10-08 DIAGNOSIS — E1140 Type 2 diabetes mellitus with diabetic neuropathy, unspecified: Secondary | ICD-10-CM | POA: Diagnosis not present

## 2020-10-08 DIAGNOSIS — E1122 Type 2 diabetes mellitus with diabetic chronic kidney disease: Secondary | ICD-10-CM | POA: Diagnosis not present

## 2021-01-03 DIAGNOSIS — E1129 Type 2 diabetes mellitus with other diabetic kidney complication: Secondary | ICD-10-CM | POA: Diagnosis not present

## 2021-01-10 ENCOUNTER — Ambulatory Visit (HOSPITAL_COMMUNITY)
Admission: RE | Admit: 2021-01-10 | Discharge: 2021-01-10 | Disposition: A | Discharge status: Home / Self Care | Payer: Medicare HMO | Source: Ambulatory Visit | Attending: Internal Medicine | Admitting: Internal Medicine

## 2021-01-10 ENCOUNTER — Other Ambulatory Visit (HOSPITAL_COMMUNITY): Payer: Self-pay | Admitting: Internal Medicine

## 2021-01-10 ENCOUNTER — Other Ambulatory Visit: Payer: Self-pay

## 2021-01-10 DIAGNOSIS — R0602 Shortness of breath: Secondary | ICD-10-CM | POA: Insufficient documentation

## 2021-01-10 DIAGNOSIS — E1140 Type 2 diabetes mellitus with diabetic neuropathy, unspecified: Secondary | ICD-10-CM | POA: Diagnosis not present

## 2021-01-10 DIAGNOSIS — R7309 Other abnormal glucose: Secondary | ICD-10-CM | POA: Diagnosis not present

## 2021-01-10 DIAGNOSIS — E1122 Type 2 diabetes mellitus with diabetic chronic kidney disease: Secondary | ICD-10-CM | POA: Diagnosis not present

## 2021-01-19 ENCOUNTER — Ambulatory Visit: Payer: Medicare HMO | Admitting: Internal Medicine

## 2021-01-19 ENCOUNTER — Encounter: Payer: Self-pay | Admitting: Internal Medicine

## 2021-01-19 ENCOUNTER — Other Ambulatory Visit: Payer: Self-pay

## 2021-01-19 DIAGNOSIS — R0600 Dyspnea, unspecified: Secondary | ICD-10-CM | POA: Diagnosis not present

## 2021-01-19 DIAGNOSIS — R0609 Other forms of dyspnea: Secondary | ICD-10-CM

## 2021-01-19 NOTE — Patient Instructions (Addendum)
Stay as active as you can and call if losing ground with exercise tolerance   Pulmonary follow up is as directed by Dr Willey Blade  Late add:  Based on desats with no explanation and c/o worse ex tol > HRCT/pfts next step

## 2021-01-19 NOTE — Progress Notes (Signed)
Subjective:     Patient ID: Christian Woodard, male   DOB: November 04, 1946     MRN: [id number]    Brief patient profile:  14 yowm quit smoking 2000 with mild doe @ wt 156  and did fine until around 2013 gave up pushing mower and gradually downhill since referred to pulmonary clinic 06/14/2017 by Dr   Willey Blade   06/14/2017 1st [facility] office visit/ Christian Woodard  No resp rx  Chief Complaint  Patient presents with  . Pulmonary Consult    Referred by Dr. Asencion Noble. Pt c/o SOB x 6 months, esp worse over the past 3 months. He states he gets out of breath walking an incline, such as to his mailbox. He states that he sometimes he wakes up in the night feeling SOB.  He also c/o non prod cough.   doe x 5 years,  Stops at top of incline on way back to house x 6 months  = MMRC1 = can walk nl pace, flat grade, can't hurry or go uphills or steps s sob   Never tried inhalers Worse in heat and humbidity  freq wakes with dry cough / chest tightness sits up and goes away  rec You do not have significant copd at this point and likely never will  Pantoprazole (protonix) 40 mg   Take  30-60 min before first meal of the day and Pepcid (famotidine)  20 mg one @  bedtime until return to office - this is the best way to tell whether stomach acid is contributing to your problem.   GERD diet     08/20/2017  f/u ov/Cedra Villalon re: doe on gerd rx/ gi eval in progress for anemia/ no resp rx  Chief Complaint  Patient presents with  . Follow-up    PFT's done today. Increased SOB for the past day. Chest feels heavy today.  continues to have noct spells of sob despite nl BNP and no evidence of chf on cxr or obvious anemia Tells me his hgb still tredning down and GI eval in progess rec You do not have any significant copd If you continue to have breathing issues after your anemia is corrected might consider trial off ACTOS next and /or cardiology evaluation    Admission date:  10/03/2019    Discharge Date:  10/07/2019   Discharge Diagnosis  AKI (acute kidney injury) ([place]) [N17.9] COVID-19 virus infection [U07.1]     Acute respiratory failure with hypoxia ([place])   Acute respiratory disease due to COVID-19 virus   Type 2 diabetes mellitus without complication ([place])   Essential hypertension    [id number] I Markhamis a 75 y.o.malewith a history of diabetes, hyperlipidemia, hypertension. Patient seen for shortness of breath, cough, and weakness that started a couple days PTA and gradually worsened. He was tested 10 days PTA and got results earlier this week for Covid. He was tested due to an outbreak at his church where he goes and flulike symptoms with body aches, decreased appetite, nausea. Nonproductive cough for the past 2 days that is worse with exertion and better with rest.   Hospital Course  Acute hypoxic respiratory failure due to COVID-19 pneumonia -This has resolved, he is on room air on discharge, ambulated in the hallway where he sustained saturation more than 92% on room air, was encouraged to take his incentive spirometry flutter valve, and keep using with , rated with IV remdesivir x5 days during hospital stay, treated with Decadron, he will be discharged on another 5  days to finish total 10 days of treatment . -Chest x-ray significant for COVID-19 pneumonia. -Was encouraged to use incentive spirometry, flutter valve, and get out of bed to chair and to prone    Virtual Visit via Telephone Note 10/27/2019   Post covid  I connected with Christian Woodard on 10/27/19 at  2:20 PM EST(did not reach on 1st attempt) by telephone and verified that I am speaking with the correct person using two identifiers.   I discussed the limitations, risks, security and privacy concerns of performing an evaluation and management service by telephone and the availability of in person appointments. I also discussed with the patient that there may be a patient responsible charge related to this service. The  patient expressed understanding and agreed to proceed.    History of Present Illness: Dyspnea:  2 laps off 02 prior to discharge / trend is improving at home but not checking sats  Cough: minimal clear  Sleeping: flat/ one pillow SABA use: none 02: none  Dex last day 10/12/2019 rec Make sure you check your oxygen saturation at your highest level of activity to be sure it stays over 90% and keep track of it at least once a week, more often if breathing getting worse, and let me know if losing ground.>>> did not do       01/19/2021  f/u ov/[facility] office/Christian Woodard re: doe p covid  Chief Complaint  Patient presents with  . Follow-up    Breathing is overall doing well and no new co's today.    Dyspnea:  Up and down the hill to the mb daily feels it getting a little harder than it was about a month ago since feet started bothering him with paresthesias and lack of balance  Cough: none/ some p eating assoc with mild dysphagia ? Barrett's  Sleeping: flat bed / R side / 1 pillow SABA use: none  02: none Covid status: vax x 3      No obvious day to day or daytime variability or assoc excess/ purulent sputum or mucus plugs or hemoptysis or cp or chest tightness, subjective wheeze or overt sinus or hb symptoms.   Sleeping as above  without nocturnal  or early am exacerbation  of respiratory  c/o's or need for noct saba. Also denies any obvious fluctuation of symptoms with weather or environmental changes or other aggravating or alleviating factors except as outlined above   No unusual exposure hx or h/o childhood pna/ asthma or knowledge of premature birth.  Current Allergies, Complete Past Medical History, Past Surgical History, Family History, and Social History were reviewed in [employer] record.  ROS  The following are not active complaints unless bolded Hoarseness, sore throat, dysphagia, dental problems, itching, sneezing,  nasal congestion or discharge of  excess mucus or purulent secretions, ear ache,   fever, chills, sweats, unintended wt loss or wt gain, classically pleuritic or exertional cp,  orthopnea pnd or arm/hand swelling  or leg swelling, presyncope, palpitations, abdominal pain, anorexia, nausea, vomiting, diarrhea  or change in bowel habits or change in bladder habits, change in stools or change in urine, dysuria, hematuria,  rash, arthralgias, visual complaints, headache, numbness, weakness or ataxia or problems with walking or coordination,  change in mood or  memory.        Current Meds  Medication Sig  . aspirin 81 MG tablet Take 81 mg by mouth daily.  Marland Kitchen atorvastatin (LIPITOR) 10 MG tablet Take 10 mg by  mouth at bedtime.  . gabapentin (NEURONTIN) 100 MG capsule Take 1 capsule by mouth in the morning, at noon, and at bedtime.  Marland Kitchen glipiZIDE (GLUCOTROL XL) 5 MG 24 hr tablet Take 5 mg by mouth daily with breakfast.  . LANTUS SOLOSTAR 100 UNIT/ML Solostar Pen Inject 14 Units into the skin daily. At bedtime  . losartan (COZAAR) 50 MG tablet Take 50 mg by mouth daily.  . metFORMIN (GLUCOPHAGE) 1000 MG tablet Take 1,000 mg by mouth 2 (two) times daily.  . pantoprazole (PROTONIX) 40 MG tablet TAKE 1 TABLET BY MOUTH DAILY 30 TO 60 MINUTES BEFORE BREAKFAST                         Objective:   Physical Exam       01/19/2021       173   08/20/2017      179   06/14/17 184 lb (83.5 kg)  02/06/17 185 lb (83.9 kg)  01/24/17 184 lb (83.5 kg)     Vital signs reviewed  01/19/2021  - Note at rest 02 sats  93% on RA   General appearance:   Pleasant amb wm nad    HEENT : pt wearing mask not removed for exam due to covid -19 concerns.    NECK :  without JVD/Nodes/TM/ nl carotid upstrokes bilaterally   LUNGS: no acc muscle use,  Nl contour chest which is clear to A and P bilaterally without cough on insp or exp maneuvers   CV:  RRR  no s3 or murmur or increase in P2, and no edema   ABD:  soft and nontender with nl inspiratory  excursion in the supine position. No bruits or organomegaly appreciated, bowel sounds nl  MS:  Nl gait/ ext warm without deformities, calf tenderness, cyanosis or clubbing No obvious joint restrictions   SKIN: warm and dry without lesions    NEURO:  alert, approp, nl sensorium with  no motor or cerebellar deficits apparent.      I personally reviewed images and agree with radiology impression as follows:  CXR:   01/10/21 Interval resolution of previously demonstrated bibasilar pulmonary opacities. No acute cardiopulmonary process.     Labs ordered/ reviewed:      Chemistry      Component Value Date/Time   NA 143 10/07/2019 0110   K 3.4 (L) 10/07/2019 0110   CL 108 10/07/2019 0110   CO2 21 (L) 10/07/2019 0110   BUN 22 10/07/2019 0110   CREATININE 1.02 10/07/2019 0110      Component Value Date/Time   CALCIUM 9.1 10/07/2019 0110   ALKPHOS 58 10/07/2019 0110   AST 43 (H) 10/07/2019 0110   ALT 37 10/07/2019 0110   BILITOT 0.9 10/07/2019 0110        Lab Results  Component Value Date   WBC 10.8 (H) 01/20/2021   HGB 12.8 (L) 01/20/2021   HCT 41.3 01/20/2021   MCV 84.8 01/20/2021   PLT 420 (H) 01/20/2021       EOS                        0.2                                   01/19/2021   Lab Results  Component Value Date   DDIMER 0.44 01/20/2021  Lab Results  Component Value Date   TSH 2.58 06/14/2017     Lab Results  Component Value Date   PROBNP 55.0 06/14/2017       Lab Results  Component Value Date   ESRSEDRATE 28 (H) 01/20/2021                 Assessment:

## 2021-01-20 ENCOUNTER — Other Ambulatory Visit (HOSPITAL_COMMUNITY)
Admission: RE | Admit: 2021-01-20 | Discharge: 2021-01-20 | Disposition: A | Discharge status: Home / Self Care | Payer: Medicare HMO | Source: Ambulatory Visit | Attending: Internal Medicine | Admitting: Internal Medicine

## 2021-01-20 ENCOUNTER — Telehealth: Payer: Self-pay | Admitting: Internal Medicine

## 2021-01-20 ENCOUNTER — Encounter: Payer: Self-pay | Admitting: Internal Medicine

## 2021-01-20 ENCOUNTER — Other Ambulatory Visit: Payer: Self-pay

## 2021-01-20 DIAGNOSIS — R0600 Dyspnea, unspecified: Secondary | ICD-10-CM | POA: Diagnosis not present

## 2021-01-20 DIAGNOSIS — R0609 Other forms of dyspnea: Secondary | ICD-10-CM

## 2021-01-20 LAB — CBC WITH DIFFERENTIAL/PLATELET
Abs Immature Granulocytes: 0.05 10*3/uL (ref 0.00–0.07)
Basophils Absolute: 0.1 10*3/uL (ref 0.0–0.1)
Basophils Relative: 1 %
Eosinophils Absolute: 0.2 10*3/uL (ref 0.0–0.5)
Eosinophils Relative: 2 %
HCT: 41.3 % (ref 39.0–52.0)
Hemoglobin: 12.8 g/dL — ABNORMAL LOW (ref 13.0–17.0)
Immature Granulocytes: 1 %
Lymphocytes Relative: 10 %
Lymphs Abs: 1.1 10*3/uL (ref 0.7–4.0)
MCH: 26.3 pg (ref 26.0–34.0)
MCHC: 31 g/dL (ref 30.0–36.0)
MCV: 84.8 fL (ref 80.0–100.0)
Monocytes Absolute: 0.9 10*3/uL (ref 0.1–1.0)
Monocytes Relative: 8 %
Neutro Abs: 8.5 10*3/uL — ABNORMAL HIGH (ref 1.7–7.7)
Neutrophils Relative %: 78 %
Platelets: 420 10*3/uL — ABNORMAL HIGH (ref 150–400)
RBC: 4.87 MIL/uL (ref 4.22–5.81)
RDW: 16.4 % — ABNORMAL HIGH (ref 11.5–15.5)
WBC: 10.8 10*3/uL — ABNORMAL HIGH (ref 4.0–10.5)
nRBC: 0 % (ref 0.0–0.2)

## 2021-01-20 LAB — SEDIMENTATION RATE: Sed Rate: 28 mm/hr — ABNORMAL HIGH (ref 0–16)

## 2021-01-20 LAB — BRAIN NATRIURETIC PEPTIDE: B Natriuretic Peptide: 50 pg/mL (ref 0.0–100.0)

## 2021-01-20 LAB — D-DIMER, QUANTITATIVE: D-Dimer, Quant: 0.44 ug/mL-FEU (ref 0.00–0.50)

## 2021-01-20 NOTE — Telephone Encounter (Signed)
Labs ordered 01/19/21 and pt has not completed these yet. Called to remind him needs to have this done. Had to [facility].

## 2021-01-20 NOTE — Assessment & Plan Note (Addendum)
Onset  2013  Spirometry 06/14/2017  FEV1 1.91 (70%)  Ratio 73 mild curvature  -  06/14/2017  Walked RA x 3 laps @ 185 ft each stopped due to  End of study, sats 89% and sob @ fast pace  - Allergy profile 06/14/17 >  Eos 0.1/  IgE    38 neg RAST  - PFT's  08/20/2017  FEV1 2.23 (84 % ) ratio 76  p 2 % improvement from saba p nothing prior to study with DLCO  45/55c % corrects to 78 % for alv volume   - Flared with COVID19 pna 09/2019  -  01/19/2021   Walked RA  approx   300 ft  @ fast pace  stopped due to  sats 86% min sob    Will need HRCT at this point as no evidence of PE/chf/anemia to explain ongoing doe/ desats and he feels ex tol at home is getting worse.   Discussed in detail all the  indications, usual  risks and alternatives  relative to the benefits with patient who agrees to proceed with w/u as outlined.     Each maintenance medication was reviewed in detail including emphasizing most importantly the difference between maintenance and prns and under what circumstances the prns are to be triggered using an action plan format where appropriate.  Total time for H and P, chart review, counseling,  directly observing portions of ambulatory 02 saturation study/ and generating customized AVS unique to this office visit / same day charting = 43min

## 2021-01-20 NOTE — Telephone Encounter (Signed)
Spoke with the pt and notified needs labs. He stated just went to [facility] to have this done. Nothing further needed.

## 2021-01-21 ENCOUNTER — Ambulatory Visit (HOSPITAL_COMMUNITY)
Admission: RE | Admit: 2021-01-21 | Discharge: 2021-01-21 | Disposition: A | Discharge status: Home / Self Care | Payer: Medicare HMO | Source: Ambulatory Visit | Attending: Internal Medicine | Admitting: Internal Medicine

## 2021-01-21 ENCOUNTER — Telehealth: Payer: Self-pay | Admitting: *Deleted

## 2021-01-21 ENCOUNTER — Other Ambulatory Visit: Payer: Self-pay

## 2021-01-21 ENCOUNTER — Encounter: Payer: Self-pay | Admitting: Internal Medicine

## 2021-01-21 DIAGNOSIS — R0600 Dyspnea, unspecified: Secondary | ICD-10-CM

## 2021-01-21 DIAGNOSIS — R0602 Shortness of breath: Secondary | ICD-10-CM | POA: Diagnosis not present

## 2021-01-21 DIAGNOSIS — R0609 Other forms of dyspnea: Secondary | ICD-10-CM

## 2021-01-21 NOTE — Telephone Encounter (Signed)
-----   Message from Tanda Rockers, MD sent at 01/21/2021  7:41 AM EST ----- Labs don't explain doe/ desats so rec HRCT next available with pfts as well

## 2021-01-21 NOTE — Telephone Encounter (Signed)
I spoke with the pt and notified of results/recs per MW. He verbalized understanding and was agreeable to HRCT and the PFT and I have placed orders for both.

## 2021-01-24 ENCOUNTER — Encounter: Payer: Self-pay | Admitting: *Deleted

## 2021-01-24 DIAGNOSIS — E1129 Type 2 diabetes mellitus with other diabetic kidney complication: Secondary | ICD-10-CM | POA: Diagnosis not present

## 2021-01-24 DIAGNOSIS — E785 Hyperlipidemia, unspecified: Secondary | ICD-10-CM | POA: Diagnosis not present

## 2021-01-24 DIAGNOSIS — E1149 Type 2 diabetes mellitus with other diabetic neurological complication: Secondary | ICD-10-CM | POA: Diagnosis not present

## 2021-01-24 DIAGNOSIS — J449 Chronic obstructive pulmonary disease, unspecified: Secondary | ICD-10-CM | POA: Diagnosis not present

## 2021-01-27 ENCOUNTER — Telehealth: Payer: Self-pay | Admitting: Internal Medicine

## 2021-01-27 NOTE — Progress Notes (Signed)
LMTCB  Note that the pt is already scheduled for PFT

## 2021-01-27 NOTE — Telephone Encounter (Signed)
Called and spoke with patient. He was made aware of his results. Patient has been scheduled for 02/18/21 at 10am in [place].

## 2021-01-28 ENCOUNTER — Other Ambulatory Visit (HOSPITAL_COMMUNITY)
Admission: RE | Admit: 2021-01-28 | Discharge: 2021-01-28 | Disposition: A | Discharge status: Home / Self Care | Payer: Medicare HMO | Source: Ambulatory Visit | Attending: Internal Medicine | Admitting: Internal Medicine

## 2021-01-28 ENCOUNTER — Other Ambulatory Visit: Payer: Self-pay

## 2021-01-28 DIAGNOSIS — Z20822 Contact with and (suspected) exposure to covid-19: Secondary | ICD-10-CM | POA: Diagnosis not present

## 2021-01-28 DIAGNOSIS — Z01812 Encounter for preprocedural laboratory examination: Secondary | ICD-10-CM | POA: Diagnosis not present

## 2021-01-28 LAB — SARS CORONAVIRUS 2 (TAT 6-24 HRS): SARS Coronavirus 2: NEGATIVE

## 2021-01-31 ENCOUNTER — Ambulatory Visit (HOSPITAL_COMMUNITY)
Admission: RE | Admit: 2021-01-31 | Discharge: 2021-01-31 | Disposition: A | Discharge status: Home / Self Care | Payer: Medicare HMO | Source: Ambulatory Visit | Attending: Internal Medicine | Admitting: Internal Medicine

## 2021-01-31 ENCOUNTER — Other Ambulatory Visit: Payer: Self-pay

## 2021-01-31 DIAGNOSIS — R0600 Dyspnea, unspecified: Secondary | ICD-10-CM | POA: Insufficient documentation

## 2021-01-31 DIAGNOSIS — R0609 Other forms of dyspnea: Secondary | ICD-10-CM

## 2021-01-31 LAB — PULMONARY FUNCTION TEST
DL/VA % pred: 337 %
DL/VA: 13.64 ml/min/mmHg/L
DLCO cor % pred: 290 %
DLCO cor: 63.39 ml/min/mmHg
DLCO unc % pred: 47 %
DLCO unc: 10.3 ml/min/mmHg
FEF 25-75 Post: 1.83 L/sec
FEF 25-75 Pre: 1.38 L/sec
FEF2575-%Change-Post: 32 %
FEF2575-%Pred-Post: 100 %
FEF2575-%Pred-Pre: 76 %
FEV1-%Change-Post: 5 %
FEV1-%Pred-Post: 88 %
FEV1-%Pred-Pre: 83 %
FEV1-Post: 2.23 L
FEV1-Pre: 2.1 L
FEV1FVC-%Change-Post: 2 %
FEV1FVC-%Pred-Pre: 100 %
FEV6-%Change-Post: 7 %
FEV6-%Pred-Post: 91 %
FEV6-%Pred-Pre: 85 %
FEV6-Post: 2.97 L
FEV6-Pre: 2.77 L
FEV6FVC-%Change-Post: 3 %
FEV6FVC-%Pred-Post: 107 %
FEV6FVC-%Pred-Pre: 104 %
FVC-%Change-Post: 3 %
FVC-%Pred-Post: 84 %
FVC-%Pred-Pre: 81 %
FVC-Post: 2.97 L
FVC-Pre: 2.87 L
Post FEV1/FVC ratio: 75 %
Post FEV6/FVC ratio: 100 %
Pre FEV1/FVC ratio: 73 %
Pre FEV6/FVC Ratio: 97 %
RV % pred: 89 %
RV: 2.07 L
TLC % pred: 84 %
TLC: 5.16 L

## 2021-01-31 MED ORDER — ALBUTEROL SULFATE (2.5 MG/3ML) 0.083% IN NEBU
2.5000 mg | INHALATION_SOLUTION | Freq: Once | RESPIRATORY_TRACT | Status: AC
  Administered 2021-01-31: 2.5 mg via RESPIRATORY_TRACT

## 2021-02-03 NOTE — Progress Notes (Signed)
LMTCB for pt 

## 2021-02-03 NOTE — Progress Notes (Signed)
Spoke with pt and notified of results per Dr. Wert. Pt verbalized understanding and denied any questions. 

## 2021-02-18 ENCOUNTER — Other Ambulatory Visit: Payer: Self-pay

## 2021-02-18 ENCOUNTER — Ambulatory Visit: Payer: Medicare HMO | Admitting: Internal Medicine

## 2021-02-18 ENCOUNTER — Other Ambulatory Visit (HOSPITAL_COMMUNITY)
Admission: RE | Admit: 2021-02-18 | Discharge: 2021-02-18 | Disposition: A | Discharge status: Home / Self Care | Payer: Medicare HMO | Source: Ambulatory Visit | Attending: Internal Medicine | Admitting: Internal Medicine

## 2021-02-18 ENCOUNTER — Encounter: Payer: Self-pay | Admitting: Internal Medicine

## 2021-02-18 DIAGNOSIS — R0609 Other forms of dyspnea: Secondary | ICD-10-CM

## 2021-02-18 DIAGNOSIS — R0600 Dyspnea, unspecified: Secondary | ICD-10-CM

## 2021-02-18 DIAGNOSIS — R058 Other specified cough: Secondary | ICD-10-CM

## 2021-02-18 LAB — CBC WITH DIFFERENTIAL/PLATELET
Abs Immature Granulocytes: 0.05 10*3/uL (ref 0.00–0.07)
Basophils Absolute: 0.1 10*3/uL (ref 0.0–0.1)
Basophils Relative: 1 %
Eosinophils Absolute: 0.2 10*3/uL (ref 0.0–0.5)
Eosinophils Relative: 2 %
HCT: 39.7 % (ref 39.0–52.0)
Hemoglobin: 12.7 g/dL — ABNORMAL LOW (ref 13.0–17.0)
Immature Granulocytes: 1 %
Lymphocytes Relative: 8 %
Lymphs Abs: 0.8 10*3/uL (ref 0.7–4.0)
MCH: 26.7 pg (ref 26.0–34.0)
MCHC: 32 g/dL (ref 30.0–36.0)
MCV: 83.4 fL (ref 80.0–100.0)
Monocytes Absolute: 0.9 10*3/uL (ref 0.1–1.0)
Monocytes Relative: 9 %
Neutro Abs: 7.7 10*3/uL (ref 1.7–7.7)
Neutrophils Relative %: 79 %
Platelets: 376 10*3/uL (ref 150–400)
RBC: 4.76 MIL/uL (ref 4.22–5.81)
RDW: 16.1 % — ABNORMAL HIGH (ref 11.5–15.5)
WBC: 9.8 10*3/uL (ref 4.0–10.5)
nRBC: 0 % (ref 0.0–0.2)

## 2021-02-18 LAB — IRON AND TIBC
Iron: 32 ug/dL — ABNORMAL LOW (ref 45–182)
Saturation Ratios: 9 % — ABNORMAL LOW (ref 17.9–39.5)
TIBC: 370 ug/dL (ref 250–450)
UIBC: 338 ug/dL

## 2021-02-18 LAB — SEDIMENTATION RATE: Sed Rate: 10 mm/hr (ref 0–16)

## 2021-02-18 NOTE — Progress Notes (Signed)
Subjective:     Patient ID: Christian Woodard, male   DOB: November 04, 1946     MRN: [id number]    Brief patient profile:  14 yowm quit smoking 2000 with mild doe @ wt 156  and did fine until around 2013 gave up pushing mower and gradually downhill since referred to pulmonary clinic 06/14/2017 by Dr   Willey Blade   06/14/2017 1st [facility] office visit/ Fabion Gatson  No resp rx  Chief Complaint  Patient presents with  . Pulmonary Consult    Referred by Dr. Asencion Noble. Pt c/o SOB x 6 months, esp worse over the past 3 months. He states he gets out of breath walking an incline, such as to his mailbox. He states that he sometimes he wakes up in the night feeling SOB.  He also c/o non prod cough.   doe x 5 years,  Stops at top of incline on way back to house x 6 months  = MMRC1 = can walk nl pace, flat grade, can't hurry or go uphills or steps s sob   Never tried inhalers Worse in heat and humbidity  freq wakes with dry cough / chest tightness sits up and goes away  rec You do not have significant copd at this point and likely never will  Pantoprazole (protonix) 40 mg   Take  30-60 min before first meal of the day and Pepcid (famotidine)  20 mg one @  bedtime until return to office - this is the best way to tell whether stomach acid is contributing to your problem.   GERD diet     08/20/2017  f/u ov/Adna Nofziger re: doe on gerd rx/ gi eval in progress for anemia/ no resp rx  Chief Complaint  Patient presents with  . Follow-up    PFT's done today. Increased SOB for the past day. Chest feels heavy today.  continues to have noct spells of sob despite nl BNP and no evidence of chf on cxr or obvious anemia Tells me his hgb still tredning down and GI eval in progess rec You do not have any significant copd If you continue to have breathing issues after your anemia is corrected might consider trial off ACTOS next and /or cardiology evaluation    Admission date:  10/03/2019    Discharge Date:  10/07/2019   Discharge Diagnosis  AKI (acute kidney injury) ([place]) [N17.9] COVID-19 virus infection [U07.1]     Acute respiratory failure with hypoxia ([place])   Acute respiratory disease due to COVID-19 virus   Type 2 diabetes mellitus without complication ([place])   Essential hypertension    WFU:XNATFTD I Markhamis a 75 y.o.malewith a history of diabetes, hyperlipidemia, hypertension. Patient seen for shortness of breath, cough, and weakness that started a couple days PTA and gradually worsened. He was tested 10 days PTA and got results earlier this week for Covid. He was tested due to an outbreak at his church where he goes and flulike symptoms with body aches, decreased appetite, nausea. Nonproductive cough for the past 2 days that is worse with exertion and better with rest.   Hospital Course  Acute hypoxic respiratory failure due to COVID-19 pneumonia -This has resolved, he is on room air on discharge, ambulated in the hallway where he sustained saturation more than 92% on room air, was encouraged to take his incentive spirometry flutter valve, and keep using with , rated with IV remdesivir x5 days during hospital stay, treated with Decadron, he will be discharged on another 5  days to finish total 10 days of treatment . -Chest x-ray significant for COVID-19 pneumonia. -Was encouraged to use incentive spirometry, flutter valve, and get out of bed to chair and to prone    Virtual Visit via Telephone Note 10/27/2019   Post covid  I connected with Ashley Akin on 10/27/19 at  2:20 PM EST(did not reach on 1st attempt) by telephone and verified that I am speaking with the correct person using two identifiers.   I discussed the limitations, risks, security and privacy concerns of performing an evaluation and management service by telephone and the availability of in person appointments. I also discussed with the patient that there may be a patient responsible charge related to this service. The  patient expressed understanding and agreed to proceed.    History of Present Illness: Dyspnea:  2 laps off 02 prior to discharge / trend is improving at home but not checking sats  Cough: minimal clear  Sleeping: flat/ one pillow SABA use: none 02: none  Dex last day 10/12/2019 rec Make sure you check your oxygen saturation at your highest level of activity to be sure it stays over 90% and keep track of it at least once a week, more often if breathing getting worse, and let me know if losing ground.>>> did not do       01/19/2021  f/u ov/[place] office/Caytlin Better re: doe p covid  Chief Complaint  Patient presents with  . Follow-up    Breathing is overall doing well and no new co's today.    Dyspnea:  Up and down the hill to the mb daily feels it getting a little harder than it was about a month ago since feet started bothering him with paresthesias and lack of balance  Cough: none/ some p eating assoc with mild dysphagia ? Barrett's  Sleeping: flat bed / R side / 1 pillow SABA use: none  02: none Covid status: vax x 3  rec Stay as active as you can and call if losing ground with exercise tolerance  Pulmonary follow up is as directed by Dr Willey Blade  Late add:  Based on desats with no explanation and c/o worse ex tol > HRCT/pfts next step > no specific findings/ 7 mm nodule needs f/u @ 6-12 m    02/18/2021  f/u ov/[place] office/Lashunta Frieden re: doe p covid Chief Complaint  Patient presents with  . Follow-up    Non productive cough since last friday   Dyspnea:  Maybe 150 ft to MB some better  Cough: new c/o x one week of throat tickle but not sense of sinus problems or pnds or excess mucus  Sleeping: bed is mostly flat  SABA use: none  02: none  Covid status: vax x 3 and had covid     No obvious day to day or daytime variability or assoc excess/ purulent sputum or mucus plugs or hemoptysis or cp or chest tightness, subjective wheeze or overt sinus or hb symptoms.     Also denies  any obvious fluctuation of symptoms with weather or environmental changes or other aggravating or alleviating factors except as outlined above   No unusual exposure hx or h/o childhood pna/ asthma or knowledge of premature birth.  Current Allergies, Complete Past Medical History, Past Surgical History, Family History, and Social History were reviewed in [employer] record.  ROS  The following are not active complaints unless bolded Hoarseness, sore throat, dysphagia, dental problems, itching, sneezing,  nasal congestion or  discharge of excess mucus or purulent secretions, ear ache,   fever, chills, sweats, unintended wt loss or wt gain, classically pleuritic or exertional cp,  orthopnea pnd or arm/hand swelling  or leg swelling, presyncope, palpitations, abdominal pain, anorexia, nausea, vomiting, diarrhea  or change in bowel habits or change in bladder habits, change in stools or change in urine, dysuria, hematuria,  rash, arthralgias, visual complaints, headache, numbness, weakness or ataxia or problems with walking or coordination,  change in mood or  memory.        Current Meds  Medication Sig  . aspirin 81 MG tablet Take 81 mg by mouth daily.  Marland Kitchen atorvastatin (LIPITOR) 10 MG tablet Take 10 mg by mouth at bedtime.  . gabapentin (NEURONTIN) 100 MG capsule Take 1 capsule by mouth in the morning, at noon, and at bedtime.  Marland Kitchen glipiZIDE (GLUCOTROL XL) 5 MG 24 hr tablet Take 5 mg by mouth daily with breakfast.  . LANTUS SOLOSTAR 100 UNIT/ML Solostar Pen Inject 40 Units into the skin daily. At bedtime  . losartan (COZAAR) 50 MG tablet Take 50 mg by mouth daily.  . metFORMIN (GLUCOPHAGE) 1000 MG tablet Take 1,000 mg by mouth 2 (two) times daily.  . pantoprazole (PROTONIX) 40 MG tablet TAKE 1 TABLET BY MOUTH DAILY 30 TO 60 MINUTES BEFORE BREAKFAST                Objective:   Physical Exam      02/18/2021       175 01/19/2021       173   08/20/2017      179   06/14/17  184 lb (83.5 kg)  02/06/17 185 lb (83.9 kg)  01/24/17 184 lb (83.5 kg)    Vital signs reviewed  02/18/2021  - Note at rest 02 sats  93% on RA   General appearance:    Pale amb wm nad    HEENT : pt wearing mask not removed for exam due to covid -19 concerns.    NECK :  without JVD/Nodes/TM/ nl carotid upstrokes bilaterally   LUNGS: no acc muscle use,  Nl contour chest which is clear to A and P bilaterally without cough on insp or exp maneuvers   CV:  RRR  no s3 or murmur or increase in P2, and no edema   ABD:  soft and nontender with nl inspiratory excursion in the supine position. No bruits or organomegaly appreciated, bowel sounds nl  MS:  Nl gait/ ext warm without deformities, calf tenderness, cyanosis or clubbing No obvious joint restrictions   SKIN: warm and dry without lesions    NEURO:  alert, approp, nl sensorium with  no motor or cerebellar deficits apparent.            Lab Results  Component Value Date   ESRSEDRATE 10 02/18/2021   ESRSEDRATE 28 (H) 01/20/2021       Lab Results  Component Value Date   HGB 12.7 (L) 02/18/2021   HGB 12.8 (L) 01/20/2021   HGB 12.7 (L) 10/07/2019     Lab Results  Component Value Date   IRON 32 (L) 02/18/2021   TIBC 370 02/18/2021   FERRITIN   10/04/2019       % sat  = 9   02/18/2021          Assessment:

## 2021-02-18 NOTE — Patient Instructions (Addendum)
Pantoprazole (protonix) 40 mg   Take  30-60 min before first meal of the day and Pepcid (famotidine)  20 mg one after supper  until return to office - this is the best way to tell whether stomach acid is contributing to your problem.     GERD (REFLUX)  is an extremely common cause of respiratory symptoms just like yours , many times with no obvious heartburn at all.    It can be treated with medication, but also with lifestyle changes including elevation of the head of your bed (ideally with 6-8inch blocks under the headboard of your bed),  Smoking cessation, avoidance of late meals, excessive alcohol, and avoid fatty foods, chocolate, peppermint, colas, red wine, and acidic juices such as orange juice.  NO MINT OR MENTHOL PRODUCTS SO NO COUGH DROPS  USE SUGARLESS CANDY INSTEAD (Jolley ranchers or Stover's or Life Savers) or even ice chips will also do - the key is to swallow to prevent all throat clearing. NO OIL BASED VITAMINS - use powdered substitutes.  Avoid fish oil when coughing.   Make sure you check your oxygen saturation at your highest level of activity to be sure it stays over 90% and keep track of it at least once a week, more often if breathing getting worse, and let me know if losing ground.    Please remember to go to the lab department @ [facility] for your tests - we will call you with the results when they are available.        Please schedule a follow up visit in 3 months but call sooner if needed

## 2021-02-18 NOTE — Assessment & Plan Note (Addendum)
Onset  2013  Spirometry 06/14/2017  FEV1 1.91 (70%)  Ratio 73 mild curvature  -  06/14/2017  Walked RA x 3 laps @ 185 ft each stopped due to  End of study, sats 89% and sob @ fast pace  - Allergy profile 06/14/17 >  Eos 0.1/  IgE    38 neg RAST  - PFT's  08/20/2017  FEV1 2.23 (84 % ) ratio 76  p 2 % improvement from saba p nothing prior to study with DLCO  45/55c % corrects to 78 % for alv volume   - Flared with COVID19 pna 09/2019  -  01/19/2021   Walked RA  approx   300 ft  @ fast pace  stopped due to  sats 86% min sob    - HRCT 01/21/2021 1. There is minimal irregular peripheral interstitial opacity most notable at the lung bases and involving non dependent portions of the right middle lobe and lingula. Findings suggest minimal fibrosis in an early "indeterminate for UIP" pattern   2. There is an irregular nodule in the right upper lobe measuring 7 mm. Non-contrast chest CT at 6-12 months is recommended (and could be performed in conjunction with follow-up suggested above)  3. Numerous prominent mediastinal and hilar lymph nodes, nonspecific and likely reactive. 4. Mild, lobular air trapping on expiratory phase imaging, suggestive of small airways disease. 5. Emphysema. 6. Coronary artery disease -  PFTs 01/31/21 no change vs priors with mild concavity  -  02/18/2021   Walked RA  approx   600 ft  @ moderate pace  stopped due to  End of study,  Min sob and sats 90% at end with hgb 12.7   Esr now nl, def trending in right direction post covid s progressive PF pattern   rec reg sub max ex and f/u in 3 m  Advised: Make sure you check your oxygen saturation at your highest level of activity to be sure it stays over 90% and keep track of it at least once a week, more often if breathing getting worse, and let me know if losing ground.    Each maintenance medication was reviewed in detail including emphasizing most importantly the difference between maintenance and prns and under what circumstances  the prns are to be triggered using an action plan format where appropriate.  Total time for H and P, chart review, counseling,  directly observing portions of ambulatory 02 saturation study/ and generating customized AVS unique to this office visit / same day charting  > 30 min

## 2021-02-18 NOTE — Assessment & Plan Note (Signed)
F/b GI  For Barrett's since 2017  rec add pepcid 20 mg in pm and diet / bed blocks and f/u GI as planned, esp since still borderline anemic.

## 2021-02-18 NOTE — Assessment & Plan Note (Signed)
  Lab Results  Component Value Date   HGB 12.7 (L) 02/18/2021   HGB 12.8 (L) 01/20/2021   HGB 12.7 (L) 10/07/2019     Lab Results  Component Value Date   IRON 32 (L) 02/18/2021   TIBC 370 02/18/2021   FERRITIN   10/04/2019       % sat  = 9   02/18/2021      Probably still fe def, defer to PCP/GI

## 2021-02-24 ENCOUNTER — Telehealth: Payer: Self-pay | Admitting: Internal Medicine

## 2021-02-24 DIAGNOSIS — Z947 Corneal transplant status: Secondary | ICD-10-CM | POA: Diagnosis not present

## 2021-02-24 DIAGNOSIS — E1129 Type 2 diabetes mellitus with other diabetic kidney complication: Secondary | ICD-10-CM | POA: Diagnosis not present

## 2021-02-24 DIAGNOSIS — E1149 Type 2 diabetes mellitus with other diabetic neurological complication: Secondary | ICD-10-CM | POA: Diagnosis not present

## 2021-02-24 DIAGNOSIS — J449 Chronic obstructive pulmonary disease, unspecified: Secondary | ICD-10-CM | POA: Diagnosis not present

## 2021-02-24 DIAGNOSIS — H40013 Open angle with borderline findings, low risk, bilateral: Secondary | ICD-10-CM | POA: Diagnosis not present

## 2021-02-24 DIAGNOSIS — H35373 Puckering of macula, bilateral: Secondary | ICD-10-CM | POA: Diagnosis not present

## 2021-02-24 DIAGNOSIS — E785 Hyperlipidemia, unspecified: Secondary | ICD-10-CM | POA: Diagnosis not present

## 2021-02-24 DIAGNOSIS — E119 Type 2 diabetes mellitus without complications: Secondary | ICD-10-CM | POA: Diagnosis not present

## 2021-02-24 NOTE — Progress Notes (Signed)
Pt notified of results. Will fu with PCP.

## 2021-02-24 NOTE — Progress Notes (Signed)
LMTCB  Already faxed copy of these results to PCP- Asencion Noble

## 2021-02-24 NOTE — Telephone Encounter (Signed)
I spoke with the pt and notified of results and he verbalized understanding. Nothing further needed.   Tanda Rockers, MD  02/19/2021 10:28 AM EDT      Call patient : Studies are c/w still borderline Fe Deficient, should send copy to PCP and GI but will not rec rx from this office as it is not a pulmonary issue and don't want to get too many cooks in the kitchen

## 2021-03-03 ENCOUNTER — Other Ambulatory Visit: Payer: Self-pay | Admitting: Gastroenterology

## 2021-03-04 NOTE — Telephone Encounter (Signed)
Needs office visit.

## 2021-03-07 ENCOUNTER — Encounter: Payer: Self-pay | Admitting: Internal Medicine

## 2021-03-14 ENCOUNTER — Telehealth: Payer: Self-pay | Admitting: Internal Medicine

## 2021-03-14 NOTE — Telephone Encounter (Signed)
Ov due to age.

## 2021-03-14 NOTE — Telephone Encounter (Signed)
NURSE OR OFFICE VISIT

## 2021-03-17 NOTE — Progress Notes (Signed)
Primary Care Physician:  Asencion Noble, MD Primary Gastroenterologist:  Dr. Gala Romney  Chief Complaint  Patient presents with  . Colonoscopy    Due for tcs. Having some trouble breathing. Feels short of breath. Seen pulmonary but was told lungs are clear. Pulmonary added Pepcid after supper, GERD is better but not breathing.    HPI:   Christian Woodard is a 75 y.o. male presenting today to discuss scheduling colonoscopy.  He has history of IDA, Barrett's esophagus with low-grade dysplasia referred to [facility] for ablation therapy, and colon polyps.  Colonoscopy in October 2017 just prior to diagnosis of IDA revealing pancolonic diverticulosis, otherwise normal exam.  Recommended repeat in 5 years.  EGD in October 2018 with Barrett's esophagus with low-grade dysplasia, small hiatal hernia with abnormal gastric mucosa biopsied (no H. pylori), normal examined duodenum.  Givens capsule completed November 2018 with localized clustered areas of white "blunted appearing" abnormal small bowel mucosa/villi of uncertain significance.  Images were to be placed on a CD and sent to [facility] to be evaluated further.  He was also referred to hematology/oncology for IDA.  Patient established with [facility].  He underwent CT enterography A/P revealing circumferential mildly irregular wall thickening of small bowel, particularly proximal small bowel, without focal mass or other focal abnormality.  Underwent small bowel enteroscopy in January 2019 with Barrett's esophagus focally positive for low-grade dysplasia, normal stomach, normal duodenum biopsied, normal examined jejunum biopsy.  Small bowel biopsies were benign. EGD January 2020 with evidence of Barrett's esophagus, negative for dysplasia.  Most recent EGD March 2021 with evidence of Barrett's esophagus, indefinite for focal low-grade dysplasia.    Today: Having some trouble with breathing. First starting seeing Dr. Melvyn Novas 3-4 years ago. Started having more trouble  with breathing after COVID in December 2020, but he was able to continue doing his routine activities without any significant limitations.  He has seen Dr. Melvyn Novas several times without any clear reason for shortness of breath.  He did have some blood work and was found to have mild anemia with hemoglobin 12.7 and was told to follow-up with GI.  Over the last 2 weeks, his shortness of breath has been worsening.  States he gets short of breath walking around the house.  He used to be able to walk up and down the hill to his mailbox without trouble, now he cannot.  O2 saturation when getting back inside from walking is about 87 to 88%.  He has not checked his oxygen saturation sitting still.  He was advised by Dr. Melvyn Novas to increase his exercise. Every now and then, he has SOB with laying down. No swelling in the abdomen or LE.   Regarding GI symptoms, he reports occasional discomfort in his abdomen which he thinks is related to what he is eating.  This is not routine.  Notices symptoms after eating fried foods.  Occasional constipation, but bowel movements are pretty regular.  If he skips a couple of days, his stools will become a bit more hard and he has to strain.  Denies bright red blood per rectum or melena.  GERD is well controlled on pantoprazole 40 mg daily.  Dr. Melvyn Novas also started him on famotidine though he is not sure why.  Denies nausea, vomiting, or dysphagia.  Wanting to know if there is somewhere closer than [facility] to have EGD.  States that hour and 15-minute drive is too much.  Wondering about [place].   Dr. Willey Blade stopped iron  about 1 year ago. Not sure why.  He was tolerating this fine.   O2 saturation today 94%.  Past Medical History:  Diagnosis Date  . Arthritis   . Diabetes mellitus   . Hypercholesteremia     Past Surgical History:  Procedure Laterality Date  . BACK SURGERY  2000  . CHOLECYSTECTOMY    . COLONOSCOPY  09/15/2011   Dr. Gala Romney: Suboptimal prep. Pancolonic  diverticulosis. Tubular adenomas. Next colonoscopy 5 years.  . COLONOSCOPY N/A 09/22/2016   Surgeon: Daneil Dolin, MD; pancolonic diverticulosis, otherwise normal exam.  Recommended repeat in 5 years.  . ESOPHAGOGASTRODUODENOSCOPY N/A 08/31/2017   Surgeon: Daneil Dolin, MD;  Barrett's esophagus with low-grade dysplasia, small hiatal hernia with abnormal gastric mucosa biopsied (no H. pylori), normal examined duodenum.    . ESOPHAGOGASTRODUODENOSCOPY  11/2018   [facility]; Barrett's esophagus, negative for dysplasia  . ESOPHAGOGASTRODUODENOSCOPY  01/2020   [facility]; Barrett's esophagus, indefinite for focal low-grade dysplasia.  Marland Kitchen GIVENS CAPSULE STUDY N/A 10/26/2017   Surgeon: Daneil Dolin, MD; localized clustered areas of white "blunted appearing" abnormal small bowel mucosa/villi of uncertain significance.  Follow-up CTE and small bowel enteroscopy at [facility] without significant findings.   Marland Kitchen HERNIA REPAIR     as a child  . left cornea transplant Left   . Right cornea transplant    . SMALL BOWEL ENTEROSCOPY  11/2017   [facility]; Barrett's esophagus focally positive for low-grade dysplasia, normal stomach, normal duodenum, normal jejunum.  Small bowel biopsies were benign.  . TONSILLECTOMY      Current Outpatient Medications  Medication Sig Dispense Refill  . aspirin 81 MG tablet Take 81 mg by mouth daily.    Marland Kitchen atorvastatin (LIPITOR) 10 MG tablet Take 10 mg by mouth at bedtime.    . famotidine (PEPCID) 20 MG tablet Take 20 mg by mouth daily. Takes after supper    . gabapentin (NEURONTIN) 100 MG capsule Take 1 capsule by mouth in the morning, at noon, and at bedtime.    Marland Kitchen glipiZIDE (GLUCOTROL XL) 5 MG 24 hr tablet Take 5 mg by mouth daily with breakfast.    . LANTUS SOLOSTAR 100 UNIT/ML Solostar Pen Inject 40 Units into the skin daily. At bedtime    . losartan (COZAAR) 50 MG tablet Take 50 mg by mouth daily.    . metFORMIN (GLUCOPHAGE) 1000 MG tablet Take 1,000 mg by mouth 2 (two) times daily.    .  pantoprazole (PROTONIX) 40 MG tablet TAKE 1 TABLET BY MOUTH ONCE DAILY 30  TO  60  MINUTES  BEFORE  BREAKFAST 90 tablet 0   No current facility-administered medications for this visit.    Allergies as of 03/18/2021  . (No Known Allergies)    Family History  Problem Relation Age of Onset  . Heart disease Mother   . Heart disease Father   . Emphysema Father        smoked  . Lung cancer Father        smoked  . Colon cancer Neg Hx     Social History   Socioeconomic History  . Marital status: Married    Spouse name: Not on file  . Number of children: Not on file  . Years of education: Not on file  . Highest education level: Not on file  Occupational History  . Not on file  Tobacco Use  . Smoking status: Former Smoker    Packs/day: 2.00    Years: 36.00    Pack  years: 72.00    Quit date: 11/27/1998    Years since quitting: 22.3  . Smokeless tobacco: Never Used  Vaping Use  . Vaping Use: Never used  Substance and Sexual Activity  . Alcohol use: No  . Drug use: No  . Sexual activity: Not on file  Other Topics Concern  . Not on file  Social History Narrative  . Not on file   Social Determinants of Health   Financial Resource Strain: Not on file  Food Insecurity: Not on file  Transportation Needs: Not on file  Physical Activity: Not on file  Stress: Not on file  Social Connections: Not on file  Intimate Partner Violence: Not on file    Review of Systems: Gen: Denies any fever, chills, cold or flulike symptoms, presyncope, syncope. CV: Denies chest pain or palpitations. Resp: See HPI GI: See HPI Derm: Denies rash Heme: See HPI  Physical Exam: BP 120/76   Pulse 97   Temp (!) 96.9 F (36.1 C) (Temporal)   Ht 5\' 5"  (1.651 m)   Wt 174 lb 9.6 oz (79.2 kg)   SpO2 94%   BMI 29.05 kg/m  General:   Alert and oriented. Pleasant and cooperative. Well-nourished and well-developed.  Head:  Normocephalic and atraumatic. Eyes:  Without icterus, sclera clear and  conjunctiva pink.  Ears:  Normal auditory acuity. Lungs:  Clear to auscultation bilaterally. Course breath sounds anteriorly. No wheezes, rales, or rhonchi. No distress.  Heart:  S1, S2 present without murmurs appreciated.  Abdomen:  +BS.  Abdomen is protuberant but soft and nontender. No HSM noted. No guarding or rebound. No masses appreciated.  Rectal:  Deferred  Msk:  Symmetrical without gross deformities. Normal posture. Extremities:  Without edema. Neurologic:  Alert and  oriented x4;  grossly normal neurologically. Skin:  Intact without significant lesions or rashes. Psych:  Normal mood and affect.   Assessment: 75 year old male with history of IDA, adenomatous colon polyps, Barrett's esophagus with low-grade dysplasia followed by [facility] presenting today to discuss scheduling surveillance colonoscopy.  He also reports mild intermittent constipation and his primary concern today is ongoing shortness of breath.  History of adenomatous colon polyps: Due for surveillance colonoscopy at this time.  Last colonoscopy in 2017 with normal exam at that time aside from pancolonic diverticulosis, recommended repeat in 5 years.  No alarm symptoms at this time.  Due to worsening shortness of breath discussed below, will hold off on scheduling colonoscopy at this time.  Regroup in 3 months.  Constipation: Mild intermittent constipation.  No alarm symptoms.  Advised to add Benefiber 2 teaspoons daily x2 weeks then increase to twice daily.  May also use MiraLAX 1 capful (17 g) daily in ounces of water as needed.  Barrett's esophagus: Known history of Barrett's esophagus with focal low-grade dysplasia.  Last EGD in March 2021 with Barrett's esophagus, indefinite for focal low-grade dysplasia. He is due for EGD at this time. He is following with [facility] in the instance that ablation may be needed.  He is asking if he can be seen somewhere closer as 1 hour and 15-minute drive is too far at this time. I will look into  this further.   GERD: Well-controlled on Protonix 40 mg daily.  No alarm symptoms.  Advised to continue his current medications.  IDA: Chronic. Stable. Prior evaluation with colonoscopy in 2017, EGD in 2018, and Givens capsule in November 2018 without significant findings.  He did have some abnormal appearance of his small bowel  and later underwent CT enterography and small bowel enteroscopy at [facility] with no significant findings.  Hemoglobin has remained stable in the upper 12 range.  Most recent labs February 2020 with hemoglobin 12.7, normocytic indices.  Iron panel from time with iron 32 (L), saturation 9% (L).  Reports Dr. Willey Blade discontinue iron about 1 year ago though he was tolerating this well. He was advised to resume oral iron daily.  Shortness of breath: Chronic mild SOB but worsening over the last 2 weeks, now with SOB with walking around the house and unable to walk to his mailbox witout trouble. States O2 saturation drops to 87-88% with walking.  Following with Dr. Melvyn Novas with no identifiable cause thus far. O2 saturation 94% today on room air.  He was advised to call Dr. Gala Romney to arrange a follow-up appointment due to worsening symptoms.  This will need to be addressed prior to scheduling procedures.    Plan:  1.  Advised patient to call Dr. Melvyn Novas to arrange follow-up due to worsening shortness of breath.  Shortness of breath need to be addressed prior to arranging endoscopic procedures.  2.  Start oral iron (325 mg ferrous sulfate) daily.  3.  Start Benefiber 2 teaspoons daily x2 weeks and increase to twice daily.  4.  Use MiraLAX 1 capful (17 g) daily in 8 ounces of water as needed.  5.  Continue Protonix 40 mg daily 30 minutes before breakfast.  6.  I will look into patient request of being followed for Barretts esophagus with low grade dysplasia at a closer location than [facility].   7.  Follow-up in 3 months to discuss scheduling procedures.    Aliene Altes, PA-C [facility]  Gastroenterology 03/18/2021

## 2021-03-18 ENCOUNTER — Ambulatory Visit: Payer: Medicare HMO | Admitting: Gastroenterology

## 2021-03-18 ENCOUNTER — Other Ambulatory Visit: Payer: Self-pay

## 2021-03-18 ENCOUNTER — Encounter: Payer: Self-pay | Admitting: Gastroenterology

## 2021-03-18 VITALS — BP 120/76 | HR 97 | Temp 96.9°F | Ht 65.0 in | Wt 174.6 lb

## 2021-03-18 DIAGNOSIS — K5900 Constipation, unspecified: Secondary | ICD-10-CM

## 2021-03-18 DIAGNOSIS — D509 Iron deficiency anemia, unspecified: Secondary | ICD-10-CM | POA: Diagnosis not present

## 2021-03-18 DIAGNOSIS — Z860101 Personal history of adenomatous and serrated colon polyps: Secondary | ICD-10-CM

## 2021-03-18 DIAGNOSIS — Z86010 Personal history of colonic polyps: Secondary | ICD-10-CM

## 2021-03-18 DIAGNOSIS — K219 Gastro-esophageal reflux disease without esophagitis: Secondary | ICD-10-CM

## 2021-03-18 DIAGNOSIS — R0602 Shortness of breath: Secondary | ICD-10-CM | POA: Diagnosis not present

## 2021-03-18 DIAGNOSIS — K22710 Barrett's esophagus with low grade dysplasia: Secondary | ICD-10-CM | POA: Diagnosis not present

## 2021-03-18 NOTE — Patient Instructions (Addendum)
Please call Dr. Melvyn Novas to arrange a follow-up visit to further evaluate your shortness of breath.  I do not think this is secondary to your very mild anemia as mild anemia is chronic for you and your hemoglobin is stable.  Please start iron.  You may pick this up over-the-counter.  Please look at the back of the bottle and ensure that each tablet contains 325 mg of ferrous sulfate.   Continue taking Protonix 40 mg daily 30 minutes before breakfast.  To help with mild intermittent constipation, start Benefiber 2 teaspoons daily x2 weeks and increase to twice daily.  If needed, you may use MiraLAX 1 capful (17 g) daily in 8 ounces of water.  We will plan to see back in 3 months to discuss scheduling colonoscopy.   Aliene Altes, PA-C [facility] Gastroenterology

## 2021-03-26 DIAGNOSIS — J449 Chronic obstructive pulmonary disease, unspecified: Secondary | ICD-10-CM | POA: Diagnosis not present

## 2021-03-26 DIAGNOSIS — E1129 Type 2 diabetes mellitus with other diabetic kidney complication: Secondary | ICD-10-CM | POA: Diagnosis not present

## 2021-03-26 DIAGNOSIS — E785 Hyperlipidemia, unspecified: Secondary | ICD-10-CM | POA: Diagnosis not present

## 2021-03-26 DIAGNOSIS — E1149 Type 2 diabetes mellitus with other diabetic neurological complication: Secondary | ICD-10-CM | POA: Diagnosis not present

## 2021-03-29 DIAGNOSIS — H524 Presbyopia: Secondary | ICD-10-CM | POA: Diagnosis not present

## 2021-03-29 DIAGNOSIS — H18613 Keratoconus, stable, bilateral: Secondary | ICD-10-CM | POA: Diagnosis not present

## 2021-04-03 ENCOUNTER — Telehealth: Payer: Self-pay | Admitting: Gastroenterology

## 2021-04-03 NOTE — Telephone Encounter (Signed)
Received labs dated 03/01/2020 from PCP.  CMP: Glucose 177 WBC 8), BUN 20, creatinine 1.11, sodium 141, potassium 4.9, chloride 103, calcium 10.0, total protein 7.0, albumin 4.0, total bilirubin 1.4 (H), alk phos 81, AST 26, ALT 25. CBC: WBC 7.2, hemoglobin 15.1, hematocrit 43.8, MCV 88, MCH 30.4, MCHC 34.5, platelets 300 Lipid panel: Total cholesterol 118, triglycerides 102, HDL 25 (L), LDL 74 Hemoglobin A1c 8.6 (H)  [facility] Nurse: Please let patient know I received labs from his PCP completed in April of last year. One thing I want to point out is his bilirubin is slightly elevated at 1.4. This is minimal. I do not see that this has been checked since April 2021. Recommend we obtain an HFP FASTING to follow-up on this.   Please arrange HFP Fasting. Dx: Elevated bilirubin.   Also, I had recommended patient reach out to Dr. Melvyn Novas to arrange an office visit ASAP due to shortness of breath.  I do not see that he has a follow-up with Dr. Melvyn Novas as of yet. Recommend he see Dr. Melvyn Novas as soon as he can and before our follow-up as we will be discussing arranging procedures.

## 2021-04-04 ENCOUNTER — Telehealth: Payer: Self-pay

## 2021-04-04 ENCOUNTER — Other Ambulatory Visit: Payer: Self-pay

## 2021-04-04 DIAGNOSIS — E1140 Type 2 diabetes mellitus with diabetic neuropathy, unspecified: Secondary | ICD-10-CM | POA: Diagnosis not present

## 2021-04-04 DIAGNOSIS — R7989 Other specified abnormal findings of blood chemistry: Secondary | ICD-10-CM

## 2021-04-04 DIAGNOSIS — E1129 Type 2 diabetes mellitus with other diabetic kidney complication: Secondary | ICD-10-CM | POA: Diagnosis not present

## 2021-04-04 DIAGNOSIS — E785 Hyperlipidemia, unspecified: Secondary | ICD-10-CM | POA: Diagnosis not present

## 2021-04-04 DIAGNOSIS — Z79899 Other long term (current) drug therapy: Secondary | ICD-10-CM | POA: Diagnosis not present

## 2021-04-04 NOTE — Progress Notes (Signed)
ERROR

## 2021-04-04 NOTE — Telephone Encounter (Signed)
FYICyril Woodard,  I spoke with this pt and gave him result note, and gave your instructions. Pt advised me to put the labs in the mail and as far as seeing Dr. Melvyn Novas asap, he stated to me that he has been to him 3 times and he hasn't done anything to help him yet. So once again I gave him your recommendations and he knows what to do. Labs put in the mail today.

## 2021-04-04 NOTE — Telephone Encounter (Signed)
Phoned and LMOVM for the pt to return call 

## 2021-04-04 NOTE — Telephone Encounter (Signed)
Noted. I am sorry to hear that he doesn't feel that he hasn't been helped thus far with his breathing trouble. At our [place], he did tell me his shortness of breath was worsening and his O2 saturations were in the mid-low 90s to upper 80s. With this, we are needing his shortness of breath under better control to move forward with procedures in the near future.

## 2021-04-05 NOTE — Telephone Encounter (Signed)
Tried calling patient to explain why I was recommending he follow-up with Dr. Melvyn Novas. (See my last documentation in this encounter). Left message on machine requesting return call.   Dena: If patient calls back, please relay my last documentation to him about why I am recommending follow-up with Dr. Melvyn Novas.

## 2021-04-05 NOTE — Telephone Encounter (Signed)
noted 

## 2021-04-11 DIAGNOSIS — I700 Atherosclerosis of aorta: Secondary | ICD-10-CM | POA: Diagnosis not present

## 2021-04-11 DIAGNOSIS — J439 Emphysema, unspecified: Secondary | ICD-10-CM | POA: Diagnosis not present

## 2021-04-11 DIAGNOSIS — E1122 Type 2 diabetes mellitus with diabetic chronic kidney disease: Secondary | ICD-10-CM | POA: Diagnosis not present

## 2021-04-11 DIAGNOSIS — R7989 Other specified abnormal findings of blood chemistry: Secondary | ICD-10-CM | POA: Diagnosis not present

## 2021-04-11 DIAGNOSIS — R7309 Other abnormal glucose: Secondary | ICD-10-CM | POA: Diagnosis not present

## 2021-04-11 DIAGNOSIS — E785 Hyperlipidemia, unspecified: Secondary | ICD-10-CM | POA: Diagnosis not present

## 2021-04-12 LAB — HEPATIC FUNCTION PANEL
AG Ratio: 1.5 (calc) (ref 1.0–2.5)
ALT: 23 U/L (ref 9–46)
AST: 21 U/L (ref 10–35)
Albumin: 4.3 g/dL (ref 3.6–5.1)
Alkaline phosphatase (APISO): 70 U/L (ref 35–144)
Bilirubin, Direct: 0.3 mg/dL — ABNORMAL HIGH (ref 0.0–0.2)
Globulin: 2.9 g/dL (calc) (ref 1.9–3.7)
Indirect Bilirubin: 0.9 mg/dL (calc) (ref 0.2–1.2)
Total Bilirubin: 1.2 mg/dL (ref 0.2–1.2)
Total Protein: 7.2 g/dL (ref 6.1–8.1)

## 2021-04-20 DIAGNOSIS — Z947 Corneal transplant status: Secondary | ICD-10-CM | POA: Diagnosis not present

## 2021-04-20 DIAGNOSIS — H18613 Keratoconus, stable, bilateral: Secondary | ICD-10-CM | POA: Diagnosis not present

## 2021-04-20 DIAGNOSIS — Z0100 Encounter for examination of eyes and vision without abnormal findings: Secondary | ICD-10-CM | POA: Diagnosis not present

## 2021-04-26 DIAGNOSIS — E1149 Type 2 diabetes mellitus with other diabetic neurological complication: Secondary | ICD-10-CM | POA: Diagnosis not present

## 2021-04-26 DIAGNOSIS — E1129 Type 2 diabetes mellitus with other diabetic kidney complication: Secondary | ICD-10-CM | POA: Diagnosis not present

## 2021-04-26 DIAGNOSIS — E785 Hyperlipidemia, unspecified: Secondary | ICD-10-CM | POA: Diagnosis not present

## 2021-04-26 DIAGNOSIS — J449 Chronic obstructive pulmonary disease, unspecified: Secondary | ICD-10-CM | POA: Diagnosis not present

## 2021-05-26 DIAGNOSIS — E1149 Type 2 diabetes mellitus with other diabetic neurological complication: Secondary | ICD-10-CM | POA: Diagnosis not present

## 2021-05-26 DIAGNOSIS — E785 Hyperlipidemia, unspecified: Secondary | ICD-10-CM | POA: Diagnosis not present

## 2021-05-26 DIAGNOSIS — J449 Chronic obstructive pulmonary disease, unspecified: Secondary | ICD-10-CM | POA: Diagnosis not present

## 2021-05-26 DIAGNOSIS — E1129 Type 2 diabetes mellitus with other diabetic kidney complication: Secondary | ICD-10-CM | POA: Diagnosis not present

## 2021-06-01 ENCOUNTER — Other Ambulatory Visit: Payer: Self-pay | Admitting: Gastroenterology

## 2021-06-12 ENCOUNTER — Telehealth: Payer: Self-pay | Admitting: Gastroenterology

## 2021-06-12 NOTE — Telephone Encounter (Signed)
[facility] Nurse: Please let patient know I have discussed his request of completing repeat EGD locally for known history of Barrett's esophagus with dysplasia with Dr. Gala Romney. Dr. Gala Romney recommended following up with [facility] as we do not have the resources locally to address the dysplasia.  He will need to reach out to [facility], who he has been following with previously, to arrange follow-up.

## 2021-06-13 NOTE — Telephone Encounter (Signed)
Lmom for pt to call me back. 

## 2021-06-14 NOTE — Telephone Encounter (Signed)
Spoke to pt.  Made him aware to follow up with [facility].  He was made aware that we do not have the resources to address dysplasia locally.  He voiced understanding.

## 2021-06-20 NOTE — Progress Notes (Signed)
Referring Provider: Asencion Noble, MD Primary Care Physician:  Asencion Noble, MD Primary GI Physician: Dr. Gala Romney  Chief Complaint  Patient presents with   Constipation    occ    HPI:   Christian Woodard is a 75 y.o. male presenting today to discuss scheduling colonoscopy.  History of  IDA, GERD, Barrett's esophagus with low-grade dysplasia referred to [facility] for consideration of ablation therapy, and colon polyps. Colonoscopy in October 2017 just prior to diagnosis of IDA revealing pancolonic diverticulosis, otherwise normal exam.  Recommended repeat in 5 years.  We reviewed  EGD in October 2018 with Barrett's esophagus with low-grade dysplasia, small hiatal hernia with abnormal gastric mucosa biopsied (no H. pylori), normal examined duodenum.  Givens capsule completed November 2018 with localized clustered areas of white "blunted appearing" abnormal small bowel mucosa/villi of uncertain significance.  Images were to be placed on a CD and sent to [facility] to be evaluated further.  He was also referred to hematology/oncology for IDA.  Patient established with [facility].  He underwent CT enterography A/P revealing circumferential mildly irregular wall thickening of small bowel, particularly proximal small bowel, without focal mass or other focal abnormality. Underwent small bowel enteroscopy in January 2019 with Barrett's esophagus focally positive for low-grade dysplasia, normal stomach, normal duodenum biopsied, normal examined jejunum biopsied.  Small bowel biopsies were benign.  EGD January 2020 with evidence of Barrett's esophagus, negative for dysplasia.  Most recent EGD March 2021 with evidence of Barrett's esophagus, indefinite for focal low-grade dysplasia.  No ablation has been pursued at this point.   Last seen in our office 03/18/21.  He had recently been having increased difficulty with shortness of breath, unable to walk to the mailbox without trouble.  Reported O2 saturation dropping  into 87-88 percentile with walking.  Following with Dr. Melvyn Novas with no identified cause.  Regarding GI symptoms, his primary concern was mild intermittent constipation.  No alarm symptoms.  GERD was well controlled on Protonix 40 mg daily and famotidine without alarm symptoms.  He was asking if EGD could be completed locally due to having to drive over an hour to [facility].  Regarding IDA, hemoglobin has remained stable, not on oral iron x1 year.  No overt GI bleeding. Plan to hold off on scheduling colonoscopy in light of ongoing shortness of breath, recommended follow-up with Dr. Melvyn Novas for further evaluation, start Benefiber, use MiraLAX if needed, resume oral iron, continue Protonix, follow-up in 3 months to discuss scheduling colonoscopy.  I would discuss with Dr. Gala Romney whether or not EGD locally would be appropriate. locally would be appropriate.   Dr. Gala Romney recommended continuing to follow with [facility] due to Barrett's esophagus with history of dysplasia as we do not have resources for ablation locally.   He hasn't followed up with Dr. Melvyn Novas.   Today: Breathing is about the same.  Little more trouble with increased humidity.  O2 saturation is 88-91% when walking. Doesn't require oxygen. No SOB at rest. Has a dry cough. No CP or palpitations. Little flutter a few nights ago, but nothing since.  He has not had follow-up with Dr. Melvyn Novas.  States he was told his lungs are fine aside from some scarring.  He has a follow-up with Dr. Willey Blade on the 17th of next month.  He is going to discuss his breathing trouble with him at that time.  GERD is well controlled. Intermittent dysphagia primarily to large pills. No trouble with foods as  long as he chews well. No abdominal pain, brbpr, or melena. Stools can be intermittently hard if he skips a day. Never tried benefiber. He is taking iron daily.   Has not called to schedule follow-up/EGD with [facility].  Weight is stable.  Past Medical History:  Diagnosis  Date   Arthritis    Barrett's esophagus    With low-grade dysplasia, following with [facility]   Diabetes mellitus    GERD (gastroesophageal reflux disease)    Hypercholesteremia     Past Surgical History:  Procedure Laterality Date   BACK SURGERY  2000   CHOLECYSTECTOMY     COLONOSCOPY  09/15/2011   Dr. Gala Romney: Suboptimal prep. Pancolonic diverticulosis. Tubular adenomas. Next colonoscopy 5 years.   COLONOSCOPY N/A 09/22/2016   Surgeon: Daneil Dolin, MD; pancolonic diverticulosis, otherwise normal exam.  Recommended repeat in 5 years.   ESOPHAGOGASTRODUODENOSCOPY N/A 08/31/2017   Surgeon: Daneil Dolin, MD;  Barrett's esophagus with low-grade dysplasia, small hiatal hernia with abnormal gastric mucosa biopsied (no H. pylori), normal examined duodenum.     ESOPHAGOGASTRODUODENOSCOPY  11/2018   [facility]; Barrett's esophagus, negative for dysplasia   ESOPHAGOGASTRODUODENOSCOPY  01/2020   [facility]; Barrett's esophagus, indefinite for focal low-grade dysplasia.   GIVENS CAPSULE STUDY N/A 10/26/2017   Surgeon: Daneil Dolin, MD; localized clustered areas of white "blunted appearing" abnormal small bowel mucosa/villi of uncertain significance.  Follow-up CTE and small bowel enteroscopy at [facility] without significant findings.    HERNIA REPAIR     as a child   left cornea transplant Left    Right cornea transplant     SMALL BOWEL ENTEROSCOPY  11/2017   [facility]; Barrett's esophagus focally positive for low-grade dysplasia, normal stomach, normal duodenum, normal jejunum.  Small bowel biopsies were benign.   TONSILLECTOMY      Current Outpatient Medications  Medication Sig Dispense Refill   aspirin 81 MG tablet Take 81 mg by mouth daily.     atorvastatin (LIPITOR) 10 MG tablet Take 10 mg by mouth at bedtime.     famotidine (PEPCID) 20 MG tablet Take 20 mg by mouth daily. Takes after supper     ferrous sulfate 325 (65 FE) MG tablet Take 325 mg by mouth daily with breakfast.     gabapentin (NEURONTIN) 100 MG  capsule Take 1 capsule by mouth in the morning, at noon, and at bedtime.     glipiZIDE (GLUCOTROL XL) 5 MG 24 hr tablet Take 5 mg by mouth daily with breakfast.     LANTUS SOLOSTAR 100 UNIT/ML Solostar Pen Inject 45 Units into the skin daily. In the morning.     losartan (COZAAR) 50 MG tablet Take 50 mg by mouth daily.     metFORMIN (GLUCOPHAGE) 1000 MG tablet Take 1,000 mg by mouth 2 (two) times daily.     pantoprazole (PROTONIX) 40 MG tablet TAKE 1 TABLET BY MOUTH ONCE DAILY 30  TO  60  MINUTES  BEFORE  BREAKFAST 90 tablet 3   No current facility-administered medications for this visit.    Allergies as of 06/22/2021   (No Known Allergies)    Family History  Problem Relation Age of Onset   Heart disease Mother    Heart disease Father    Emphysema Father        smoked   Lung cancer Father        smoked   Colon cancer Neg Hx     Social History   Socioeconomic History   Marital status:  Married    Spouse name: Not on file   Number of children: Not on file   Years of education: Not on file   Highest education level: Not on file  Occupational History   Not on file  Tobacco Use   Smoking status: Former    Packs/day: 2.00    Years: 36.00    Pack years: 72.00    Types: Cigarettes    Quit date: 11/27/1998    Years since quitting: 22.5   Smokeless tobacco: Never  Vaping Use   Vaping Use: Never used  Substance and Sexual Activity   Alcohol use: No   Drug use: No   Sexual activity: Not on file  Other Topics Concern   Not on file  Social History Narrative   Not on file   Social Determinants of Health   Financial Resource Strain: Not on file  Food Insecurity: Not on file  Transportation Needs: Not on file  Physical Activity: Not on file  Stress: Not on file  Social Connections: Not on file    Review of Systems: Gen: Denies fever, chills, cold or flulike symptoms, presyncope, syncope. CV: Denies chest pain, palpitations. Resp: See HPI GI: See HPI Heme: See  HPI  Physical Exam: BP 140/81   Pulse (!) 101   Temp (!) 97.5 F (36.4 C) (Temporal)   Ht '5\' 5"'$  (1.651 m)   Wt 174 lb 6.4 oz (79.1 kg)   BMI 29.02 kg/m  General:   Alert and oriented. No distress noted. Pleasant and cooperative.  Head:  Normocephalic and atraumatic. Eyes:  Conjuctiva clear without scleral icterus. Heart:  S1, S2 present without murmurs appreciated. Lungs:  Clear to auscultation bilaterally. No wheezes, rales, or rhonchi. No distress.  Abdomen:  +BS, soft, non-tender and non-distended. No rebound or guarding. No HSM or masses noted. Msk:  Symmetrical without gross deformities. Normal posture. Extremities:  Without edema. Neurologic:  Alert and  oriented x4 Psych: Normal mood and affect.   Assessment: 75 year old male with history of IDA s/p extensive evaluation in 2017/2018, GERD, Barrett's esophagus with low-grade dysplasia following with [facility] for possible ablation therapy if needed, history of adenomatous colon polyps presenting today to discuss scheduling colonoscopy.    Patient's last colonoscopy was in 2017.  No polyps at that time and recommended repeat in 5 years.  He was last seen in our office in April to schedule colonoscopy, but due to increased shortness of breath with O2 saturations dropping to 87% with walking, it was decided to hold off until he had follow-up with Dr. Melvyn Novas. Hemoglobin was stable in 12 range with no overt GI bleeding.  He has not had follow-up with Dr. Melvyn Novas.  States he was told his lungs were fine aside from scarring.  He has follow-up with Dr. Willey Blade on August 17 to discuss shortness of breath further.  He denies brbpr, melena, and unintentional weight loss.  Only with mild intermittent constipation. At this point, I am not in a rush to get surveillance colonoscopy scheduled. I would prefer his shortness of breath get addressed first. It was decided that patient would follow-up with Dr. Willey Blade and call our office back after this to let us know  what the plan will be for his SOB. If no further work up is recommended, we will proceed with colonoscopy.  If he is referred for further evaluation, we will hold off on colonoscopy until this is complete.  IDA: Prior evaluation with colonoscopy in 2017, EGD in 2018, and  Givens capsule in November 2018 without significant findings.  He did have some abnormal appearance of his small bowel and later underwent CT enterography and small bowel enteroscopy at [facility] with no significant findings.  Hemoglobin has remained stable in the upper 12 range. Most recent labs in March 2022 with hemoglobin stable at 12.7, iron low at 32 and saturation low at 9%.  He has been off of iron for quite some time, but resumed in April 2022.  We will need to update labs to ensure stability/improvement.  Patient request to have blood work completed at his follow-up with Dr. Willey Blade.   GERD: Well controlled on Protonix 40 mg daily and famotidine 20 mg daily.  Barrett's esophagus with dysplasia: Known history of Barrett's esophagus with low-grade dysplasia, following with [facility] for possible ablation therapy if needed.  Last EGD March 2021 with Barrett's esophagus, biopsies indefinite for focal low-grade dysplasia.  No ablation therapy has been pursued at this point.  I have recommended he reach out to [facility] to discuss arranging follow-up/scheduling repeat EGD for surveillance purposes.  Notably, he also reports intermittent dysphagia to large pills. No food or liquid dysphagia. This can also be addressed at the time of repeat EGD.   Plan: 1.  Keep upcoming appointment with Dr. Willey Blade to discuss shortness of breath.  Patient is to call our office to give an update on what the plan will be for his shortness of breath after his visit.  If no further evaluation is recommended, we will proceed with colonoscopy.  If further evaluation is recommended, we will hold off on colonoscopy until this has been completed. 2.  Start Benefiber 2 teaspoons  daily x2 weeks and increase to twice daily. 3.  May use Colace 100 mg daily if needed for constipation. 4.  Continue pantoprazole 40 mg daily. 5.  Continue famotidine 20 mg daily. 6.  Call [facility] to arrange follow-up/repeat EGD due to history of Barrett's esophagus with dysplasia as well as dysphagia. 7.  We will have nursing staff reach out to Dr. Ria Comment office to request CBC and iron panel to be completed at patient's visit in August.     Aliene Altes, [facility] 06/22/2021

## 2021-06-22 ENCOUNTER — Encounter: Payer: Self-pay | Admitting: Gastroenterology

## 2021-06-22 ENCOUNTER — Other Ambulatory Visit: Payer: Self-pay

## 2021-06-22 ENCOUNTER — Ambulatory Visit: Payer: Medicare HMO | Admitting: Gastroenterology

## 2021-06-22 ENCOUNTER — Other Ambulatory Visit: Payer: Self-pay | Admitting: *Deleted

## 2021-06-22 ENCOUNTER — Telehealth: Payer: Self-pay | Admitting: Gastroenterology

## 2021-06-22 VITALS — BP 140/81 | HR 101 | Temp 97.5°F | Ht 65.0 in | Wt 174.4 lb

## 2021-06-22 DIAGNOSIS — D509 Iron deficiency anemia, unspecified: Secondary | ICD-10-CM

## 2021-06-22 DIAGNOSIS — K22710 Barrett's esophagus with low grade dysplasia: Secondary | ICD-10-CM

## 2021-06-22 DIAGNOSIS — K219 Gastro-esophageal reflux disease without esophagitis: Secondary | ICD-10-CM

## 2021-06-22 DIAGNOSIS — R1310 Dysphagia, unspecified: Secondary | ICD-10-CM | POA: Diagnosis not present

## 2021-06-22 DIAGNOSIS — K5900 Constipation, unspecified: Secondary | ICD-10-CM

## 2021-06-22 DIAGNOSIS — Z86010 Personal history of colonic polyps: Secondary | ICD-10-CM

## 2021-06-22 DIAGNOSIS — R0602 Shortness of breath: Secondary | ICD-10-CM | POA: Diagnosis not present

## 2021-06-22 NOTE — Patient Instructions (Signed)
Please keep your follow-up appointment with Dr. Willey Blade to discuss your ongoing shortness of breath.  You need to discuss with him whether or not you need supplemental oxygen.   Please call our office after you have seen Dr. Willey Blade to give [place] an update.  We should be able to get you scheduled for colonoscopy at that point.  I just want to ensure that you have your oxygen if this is recommended by Dr. Willey Blade.  As we discussed if your oxygen saturation is too low, anesthesia may cancel your procedure.  Continue taking pantoprazole and famotidine as prescribed.  For mild constipation: Start Benefiber 2 teaspoons daily x2 weeks then increase to twice daily. If your bowels are not moving daily, you may add Colace 100 mg daily.  You also need to reach out to [facility] to get your repeat EGD scheduled due to history of Barrett's esophagus with dysplasia.   We will wait to hear from you about scheduling your colonoscopy.   Aliene Altes, PA-C [facility]

## 2021-06-22 NOTE — Telephone Encounter (Signed)
Spoke to pt. Informed him of needing labs drawn. He is having them drawn at Dr. Ria Comment office when he has his appt. On 07/12/21. Lab requisition were mailed to pt. Voiced that he understood.

## 2021-06-22 NOTE — Telephone Encounter (Signed)
Noted  

## 2021-06-22 NOTE — Telephone Encounter (Signed)
Courtney, I am needing to arrange for repeat CBC and iron panel for this patient due to history of iron deficiency anemia.  He has requested to have blood work completed at his upcoming appointment with Dr. Willey Blade in August.  Can we arrange for this? We will need results to be faxed back to our office.

## 2021-06-26 DIAGNOSIS — J449 Chronic obstructive pulmonary disease, unspecified: Secondary | ICD-10-CM | POA: Diagnosis not present

## 2021-06-26 DIAGNOSIS — E1129 Type 2 diabetes mellitus with other diabetic kidney complication: Secondary | ICD-10-CM | POA: Diagnosis not present

## 2021-06-26 DIAGNOSIS — E785 Hyperlipidemia, unspecified: Secondary | ICD-10-CM | POA: Diagnosis not present

## 2021-06-26 DIAGNOSIS — E1149 Type 2 diabetes mellitus with other diabetic neurological complication: Secondary | ICD-10-CM | POA: Diagnosis not present

## 2021-06-29 ENCOUNTER — Other Ambulatory Visit (HOSPITAL_COMMUNITY)
Admission: RE | Admit: 2021-06-29 | Discharge: 2021-06-29 | Disposition: A | Discharge status: Home / Self Care | Payer: Medicare HMO | Source: Ambulatory Visit | Attending: Gastroenterology | Admitting: Gastroenterology

## 2021-06-29 DIAGNOSIS — D509 Iron deficiency anemia, unspecified: Secondary | ICD-10-CM | POA: Insufficient documentation

## 2021-06-29 LAB — CBC WITH DIFFERENTIAL/PLATELET
Abs Immature Granulocytes: 0.06 10*3/uL (ref 0.00–0.07)
Basophils Absolute: 0.1 10*3/uL (ref 0.0–0.1)
Basophils Relative: 1 %
Eosinophils Absolute: 0.2 10*3/uL (ref 0.0–0.5)
Eosinophils Relative: 2 %
HCT: 45.4 % (ref 39.0–52.0)
Hemoglobin: 14.9 g/dL (ref 13.0–17.0)
Immature Granulocytes: 1 %
Lymphocytes Relative: 9 %
Lymphs Abs: 0.8 10*3/uL (ref 0.7–4.0)
MCH: 29.2 pg (ref 26.0–34.0)
MCHC: 32.8 g/dL (ref 30.0–36.0)
MCV: 89 fL (ref 80.0–100.0)
Monocytes Absolute: 1 10*3/uL (ref 0.1–1.0)
Monocytes Relative: 11 %
Neutro Abs: 7 10*3/uL (ref 1.7–7.7)
Neutrophils Relative %: 76 %
Platelets: 321 10*3/uL (ref 150–400)
RBC: 5.1 MIL/uL (ref 4.22–5.81)
RDW: 16.1 % — ABNORMAL HIGH (ref 11.5–15.5)
WBC: 9.2 10*3/uL (ref 4.0–10.5)
nRBC: 0 % (ref 0.0–0.2)

## 2021-06-29 LAB — IRON AND TIBC
Iron: 139 ug/dL (ref 45–182)
Saturation Ratios: 45 % — ABNORMAL HIGH (ref 17.9–39.5)
TIBC: 310 ug/dL (ref 250–450)
UIBC: 171 ug/dL

## 2021-06-29 LAB — FERRITIN: Ferritin: 23 ng/mL — ABNORMAL LOW (ref 24–336)

## 2021-07-04 ENCOUNTER — Other Ambulatory Visit: Payer: Self-pay | Admitting: *Deleted

## 2021-07-04 DIAGNOSIS — D509 Iron deficiency anemia, unspecified: Secondary | ICD-10-CM

## 2021-07-05 DIAGNOSIS — E1129 Type 2 diabetes mellitus with other diabetic kidney complication: Secondary | ICD-10-CM | POA: Diagnosis not present

## 2021-07-12 DIAGNOSIS — E1140 Type 2 diabetes mellitus with diabetic neuropathy, unspecified: Secondary | ICD-10-CM | POA: Diagnosis not present

## 2021-07-12 DIAGNOSIS — E1122 Type 2 diabetes mellitus with diabetic chronic kidney disease: Secondary | ICD-10-CM | POA: Diagnosis not present

## 2021-07-27 DIAGNOSIS — E1149 Type 2 diabetes mellitus with other diabetic neurological complication: Secondary | ICD-10-CM | POA: Diagnosis not present

## 2021-07-27 DIAGNOSIS — E785 Hyperlipidemia, unspecified: Secondary | ICD-10-CM | POA: Diagnosis not present

## 2021-07-27 DIAGNOSIS — E1129 Type 2 diabetes mellitus with other diabetic kidney complication: Secondary | ICD-10-CM | POA: Diagnosis not present

## 2021-07-27 DIAGNOSIS — J449 Chronic obstructive pulmonary disease, unspecified: Secondary | ICD-10-CM | POA: Diagnosis not present

## 2021-08-10 ENCOUNTER — Telehealth: Payer: Self-pay | Admitting: Gastroenterology

## 2021-08-10 NOTE — Telephone Encounter (Signed)
Spoke with patient to follow up on shortness of breath as we were waiting for patient to see his primary care provider before scheduling a colonoscopy. States he saw his PCP who felt his breathing was stable, but he has an appointment next week with Dr. Melvyn Novas (pulmonologist) on 9/22.  Tammy: Can you request pulmonary clearance for colonoscopy after patient has visit with Dr. Melvyn Novas on 9/22?

## 2021-08-18 ENCOUNTER — Other Ambulatory Visit: Payer: Self-pay

## 2021-08-18 ENCOUNTER — Encounter: Payer: Self-pay | Admitting: Internal Medicine

## 2021-08-18 ENCOUNTER — Ambulatory Visit: Payer: Medicare HMO | Admitting: Internal Medicine

## 2021-08-18 VITALS — BP 118/72 | HR 72 | Temp 98.8°F | Ht 66.0 in | Wt 170.1 lb

## 2021-08-18 DIAGNOSIS — R0600 Dyspnea, unspecified: Secondary | ICD-10-CM

## 2021-08-18 DIAGNOSIS — Z6827 Body mass index (BMI) 27.0-27.9, adult: Secondary | ICD-10-CM | POA: Diagnosis not present

## 2021-08-18 DIAGNOSIS — E785 Hyperlipidemia, unspecified: Secondary | ICD-10-CM | POA: Diagnosis not present

## 2021-08-18 DIAGNOSIS — E669 Obesity, unspecified: Secondary | ICD-10-CM | POA: Diagnosis not present

## 2021-08-18 DIAGNOSIS — R058 Other specified cough: Secondary | ICD-10-CM | POA: Diagnosis not present

## 2021-08-18 DIAGNOSIS — Z8616 Personal history of COVID-19: Secondary | ICD-10-CM | POA: Diagnosis not present

## 2021-08-18 DIAGNOSIS — E119 Type 2 diabetes mellitus without complications: Secondary | ICD-10-CM | POA: Diagnosis not present

## 2021-08-18 DIAGNOSIS — Z947 Corneal transplant status: Secondary | ICD-10-CM | POA: Diagnosis not present

## 2021-08-18 DIAGNOSIS — R0609 Other forms of dyspnea: Secondary | ICD-10-CM

## 2021-08-18 DIAGNOSIS — K449 Diaphragmatic hernia without obstruction or gangrene: Secondary | ICD-10-CM | POA: Diagnosis not present

## 2021-08-18 DIAGNOSIS — K2270 Barrett's esophagus without dysplasia: Secondary | ICD-10-CM | POA: Diagnosis not present

## 2021-08-18 DIAGNOSIS — M1990 Unspecified osteoarthritis, unspecified site: Secondary | ICD-10-CM | POA: Diagnosis not present

## 2021-08-18 DIAGNOSIS — K22710 Barrett's esophagus with low grade dysplasia: Secondary | ICD-10-CM | POA: Diagnosis not present

## 2021-08-18 NOTE — Patient Instructions (Addendum)
Increase gabapentin to where an extra pill first in evening  then add a pill weekly until cough is gone with a  maximum 300 mg three times a day and call me with the amount that you need to eliminate the cough  For drainage / throat tickle try take CHLORPHENIRAMINE  4 mg  (Chlortab 4mg   at [employer] should be easiest to find in the green box)  take one every 4 hours as needed - available over the counter- may cause drowsiness so start with just a dose or two an hour before bedtime and see how you tolerate it before trying in daytime    Make sure you check your oxygen saturation at your highest level of activity to be sure it stays over 90% and keep track of it at least once a week, more often if breathing getting worse, and let me know if losing ground.   We will schedule an echocardiogram for your heart murmur  Please schedule a follow up visit in 3 months but call sooner if needed

## 2021-08-18 NOTE — Progress Notes (Signed)
Subjective:     Patient ID: Christian Woodard, male   DOB: 14-Nov-1946     MRN: [id number]    Brief patient profile:  7 yowm quit smoking 2000 with mild doe @ wt 156  and did fine until around 2013 gave up pushing mower and gradually downhill since referred to pulmonary clinic 06/14/2017 by Dr   Willey Blade   06/14/2017 1st [facility] office visit/ Lanora Reveron  No resp rx  Chief Complaint  Patient presents with   Pulmonary Consult    Referred by Dr. Asencion Noble. Pt c/o SOB x 6 months, esp worse over the past 3 months. He states he gets out of breath walking an incline, such as to his mailbox. He states that he sometimes he wakes up in the night feeling SOB.  He also c/o non prod cough.   doe x 5 years,  Stops at top of incline on way back to house x 6 months  = MMRC1 = can walk nl pace, flat grade, can't hurry or go uphills or steps s sob   Never tried inhalers Worse in heat and humbidity  freq wakes with dry cough / chest tightness sits up and goes away  rec You do not have significant copd at this point and likely never will  Pantoprazole (protonix) 40 mg   Take  30-60 min before first meal of the day and Pepcid (famotidine)  20 mg one @  bedtime until return to office - this is the best way to tell whether stomach acid is contributing to your problem.   GERD diet     08/20/2017  f/u ov/Khala Tarte re: doe on gerd rx/ gi eval in progress for anemia/ no resp rx  Chief Complaint  Patient presents with   Follow-up    PFT's done today. Increased SOB for the past day. Chest feels heavy today.  continues to have noct spells of sob despite nl BNP and no evidence of chf on cxr or obvious anemia Tells me his hgb still tredning down and GI eval in progess rec You do not have any significant copd If you continue to have breathing issues after your anemia is corrected might consider trial off ACTOS next and /or cardiology evaluation    Admission date:  10/03/2019     Discharge Date:  10/07/2019    Discharge Diagnosis  AKI (acute kidney injury) ([place]) [N17.9] COVID-19 virus infection [U07.1]     Acute respiratory failure with hypoxia ([place])   Acute respiratory disease due to COVID-19 virus   Type 2 diabetes mellitus without complication ([place])   Essential hypertension    HPI: Christian Woodard is a 75 y.o. male with a history of diabetes, hyperlipidemia, hypertension.  Patient seen for shortness of breath, cough, and weakness that started a couple days PTA and gradually worsened.  He was tested 10 days PTA and got results earlier this week for Covid.  He was tested due to an outbreak at his church where he goes and flulike symptoms with body aches, decreased appetite, nausea.  Nonproductive cough for the past 2 days that is worse with exertion and better with rest.    Hospital Course  Acute hypoxic respiratory failure due to COVID-19  pneumonia -This has resolved, he is on room air on discharge, ambulated in the hallway where he sustained saturation more than 92% on room air, was encouraged to take his incentive spirometry flutter valve, and keep using with , rated with IV remdesivir x5 days during  hospital stay, treated with Decadron, he will be discharged on another 5 days to finish total 10 days of treatment . -Chest x-ray significant for COVID-19 pneumonia. -Was encouraged to use incentive spirometry, flutter valve, and get out of bed to chair and to prone    Virtual Visit via Telephone Note 10/27/2019   Post covid  I connected with Christian Woodard on 10/27/19 at  2:20 PM EST(did not reach on 1st attempt) by telephone and verified that I am speaking with the correct person using two identifiers.   I discussed the limitations, risks, security and privacy concerns of performing an evaluation and management service by telephone and the availability of in person appointments. I also discussed with the patient that there may be a patient responsible charge related to this service. The  patient expressed understanding and agreed to proceed.    History of Present Illness: Dyspnea:  2 laps off 02 prior to discharge / trend is improving at home but not checking sats  Cough: minimal clear  Sleeping: flat/ one pillow SABA use: none 02: none  Dex last day 10/12/2019 rec Make sure you check your oxygen saturation at your highest level of activity to be sure it stays over 90% and keep track of it at least once a week, more often if breathing getting worse, and let me know if losing ground.>>> did not do       01/19/2021  f/u ov/[place] office/Arlie Posch re: doe p covid  Chief Complaint  Patient presents with   Follow-up    Breathing is overall doing well and no new co's today.    Dyspnea:  Up and down the hill to the mb daily feels it getting a little harder than it was about a month ago since feet started bothering him with paresthesias and lack of balance  Cough: none/ some p eating assoc with mild dysphagia ? Barrett's  Sleeping: flat bed / R side / 1 pillow SABA use: none  02: none Covid status: vax x 3  rec Stay as active as you can and call if losing ground with exercise tolerance  Pulmonary follow up is as directed by Dr Willey Blade  Late add:  Based on desats with no explanation and c/o worse ex tol > HRCT/pfts next step > no specific findings/ 7 mm nodule needs f/u @ 6-12 m    02/18/2021  f/u ov/[place] office/Brendalee Matthies re: doe p covid Chief Complaint  Patient presents with   Follow-up    Non productive cough since last friday   Dyspnea:  Maybe 150 ft to MB some better  Cough: new c/o x one week of throat tickle but not sense of sinus problems or pnds or excess mucus  Sleeping: bed is mostly flat  SABA use: none  02: none  Covid status: vax x 3 and had covid Rec Pantoprazole (protonix) 40 mg   Take  30-60 min before first meal of the day and Pepcid (famotidine)  20 mg one after supper   GERD diet reviewed, bed blocks rec  Make sure you check your oxygen  saturation at your highest level of activity to be sure it stays over 90%          08/18/2021  f/u ov/[place] office/Sinclair Alligood re: uacs maint on ppi ac and pepcid hs   with Fe def anemia w/u in progress  Chief Complaint  Patient presents with   Follow-up    Pt reports that the change of weather does affect his breathing  but it has improved since last visit. Pt feels that he is coughing more but has endoscopy today.   Dyspnea:  some better from mailbox to house with hgb improving  Using pedal ex x 15 min   Cough: still an issue, dry worse when lie down Sleeping: bed is slt raised, one pilow  SABA use: none  02: none  Covid status: vax x 3 and covid      No obvious day to day or daytime variability or assoc excess/ purulent sputum or mucus plugs or hemoptysis or cp or chest tightness, subjective wheeze or overt sinus or hb symptoms.     Also denies any obvious fluctuation of symptoms with weather or environmental changes or other aggravating or alleviating factors except as outlined above   No unusual exposure hx or h/o childhood pna/ asthma or knowledge of premature birth.  Current Allergies, Complete Past Medical History, Past Surgical History, Family History, and Social History were reviewed in [employer] record.  ROS  The following are not active complaints unless bolded Hoarseness, sore throat, dysphagia, dental problems, itching, sneezing,  nasal congestion or discharge of excess mucus or purulent secretions, ear ache,   fever, chills, sweats, unintended wt loss or wt gain, classically pleuritic or exertional cp,  orthopnea pnd or arm/hand swelling  or leg swelling, presyncope, palpitations, abdominal pain, anorexia, nausea, vomiting, diarrhea  or change in bowel habits or change in bladder habits, change in stools or change in urine, dysuria, hematuria,  rash, arthralgias, visual complaints, headache, numbness, weakness or ataxia or problems with walking or  coordination,  change in mood or  memory.        Current Meds  Medication Sig   aspirin 81 MG tablet Take 81 mg by mouth daily.   atorvastatin (LIPITOR) 10 MG tablet Take 10 mg by mouth at bedtime.   famotidine (PEPCID) 20 MG tablet Take 20 mg by mouth daily. Takes after supper   ferrous sulfate 325 (65 FE) MG tablet Take 325 mg by mouth daily with breakfast.   gabapentin (NEURONTIN) 100 MG capsule Take 1 capsule by mouth in the morning, at noon, and at bedtime.   glipiZIDE (GLUCOTROL XL) 5 MG 24 hr tablet Take 5 mg by mouth daily with breakfast.   LANTUS SOLOSTAR 100 UNIT/ML Solostar Pen Inject 40 Units into the skin daily. In the morning.   losartan (COZAAR) 50 MG tablet Take 50 mg by mouth daily.   metFORMIN (GLUCOPHAGE) 1000 MG tablet Take 1,000 mg by mouth 2 (two) times daily.   pantoprazole (PROTONIX) 40 MG tablet TAKE 1 TABLET BY MOUTH ONCE DAILY 30  TO  60  MINUTES  BEFORE  BREAKFAST                   Objective:   Physical Exam     08/18/2021       170  02/18/2021       175 01/19/2021       173   08/20/2017      179   06/14/17 184 lb (83.5 kg)  02/06/17 185 lb (83.9 kg)  01/24/17 184 lb (83.5 kg)     Vital signs reviewed  08/18/2021  - Note at rest 02 sats  95% on RA   General appearance:    amb wm nad / occ throat clearing      HEENT : pt wearing mask not removed for exam due to covid -19 concerns.    NECK :  without JVD/Nodes/TM/ nl carotid upstrokes bilaterally   LUNGS: no acc muscle use,  Nl contour chest which is clear to A and P bilaterally without cough on insp or exp maneuvers   CV:  RRR  no s3   2/6 SEM s increase in P2, and no edema   ABD:  soft and nontender with nl inspiratory excursion in the supine position. No bruits or organomegaly appreciated, bowel sounds nl  MS:  Nl gait/ ext warm without deformities, calf tenderness, cyanosis or clubbing No obvious joint restrictions   SKIN: warm and dry without lesions    NEURO:  alert, approp, nl  sensorium with  no motor or cerebellar deficits apparent.       Assessment:

## 2021-08-19 ENCOUNTER — Encounter: Payer: Self-pay | Admitting: Internal Medicine

## 2021-08-19 NOTE — Assessment & Plan Note (Addendum)
F/b GI  For Barrett's since 2017  - 08/18/2021 advised to increase gabapentin to max of 300 mg tid   Upper airway cough syndrome (previously labeled PNDS),  is so named because it's frequently impossible to sort out how much is  CR/sinusitis with freq throat clearing (which can be related to primary GERD)   vs  causing  secondary (" extra esophageal")  GERD from wide swings in gastric pressure that occur with throat clearing, often  promoting self use of mint and menthol lozenges that reduce the lower esophageal sphincter tone and exacerbate the problem further in a cyclical fashion.   These are the same pts (now being labeled as having "irritable larynx syndrome" by some cough centers) who not infrequently have a history of having failed to tolerate ace inhibitors,  dry powder inhalers or biphosphonates or report having atypical/extraesophageal reflux symptoms that don't respond to standard doses of PPI  and are easily confused as having aecopd or asthma flares by even experienced allergists/ pulmonologists (myself included).   >>> continue max for gerd and titrate up gabapentin as above using the lowest effective dose plus 1st gen H1 blockers  Prn for any perceived pnds.  Each maintenance medication was reviewed in detail including emphasizing most importantly the difference between maintenance and prns and under what circumstances the prns are to be triggered using an action plan format where appropriate.  Total time for H and P, chart review, counseling,  directly observing portions of ambulatory 02 saturation study/ and generating customized AVS unique to this office visit / same day charting = 28 min

## 2021-08-19 NOTE — Assessment & Plan Note (Addendum)
Onset  2013  Spirometry 06/14/2017  FEV1 1.91 (70%)  Ratio 73 mild curvature  -  06/14/2017  Walked RA x 3 laps @ 185 ft each stopped due to  End of study, sats 89% and sob @ fast pace  - Allergy profile 06/14/17 >  Eos 0.1/  IgE    38 neg RAST  - PFT's  08/20/2017  FEV1 2.23 (84 % ) ratio 76  p 2 % improvement from saba p nothing prior to study with DLCO  45/55c % corrects to 78 % for alv volume   - Flared with COVID19 pna 09/2019  -  01/19/2021   Walked RA  approx   300 ft  @ fast pace  stopped due to  sats 86% min sob    - HRCT 01/21/2021 1. There is minimal irregular peripheral interstitial opacity most notable at the lung bases and involving non dependent portions of the right middle lobe and lingula. Findings suggest minimal fibrosis in an early "indeterminate for UIP" pattern   2. There is an irregular nodule in the right upper lobe measuring 7 mm. Non-contrast chest CT at 6-12 months is recommended (and could be performed in conjunction with follow-up suggested above)  3. Numerous prominent mediastinal and hilar lymph nodes, nonspecific and likely reactive. 4. Mild, lobular air trapping on expiratory phase imaging, suggestive of small airways disease. 5. Emphysema. 6. Coronary artery disease -  PFTs 01/31/21 no change vs priors with mild concavity  -  02/18/2021   Walked RA  approx   600 ft  @ moderate pace  stopped due to  End of study,  Min sob and sats 90% at end with hgb 12.7    - Echo 08/18/2021 (heart murmur) >>>  - 08/18/2021   Walked on RA x  3  lap(s) =  approx 487ft @ mod to fast pace, stopped due to end of study with mild sob and   with lowest 02 sats 90%  Have not excluded early uip here but clinical evaluation has been skewed by co-existing fe def anemia so this w/u needs to be complete and best way to follow is serial sats at peak ex at home/ advised.   >>> f/u in 3 m   Addendum:  There is no pulmonary contraindication to colonoscopy or any form of anesthesia needed to  complete it.  Christinia Gully, MD Pulmonary and [place] [phone]   After 7:00 pm call Elink  [phone]

## 2021-08-22 ENCOUNTER — Telehealth: Payer: Self-pay | Admitting: Gastroenterology

## 2021-08-22 NOTE — Telephone Encounter (Signed)
Cleared for surgery from pulmonary perspective

## 2021-08-22 NOTE — Telephone Encounter (Signed)
Error

## 2021-08-22 NOTE — Telephone Encounter (Signed)
Aliene Altes PA-C is requesting pulmonary clearance for this patient for a colonoscopy. As noted below, patient seen his PCP who felt his breathing was stable, but Aliene Altes PA-C wanted to clear it with patient's pulmonologist as well. Thank you!

## 2021-08-23 ENCOUNTER — Telehealth: Payer: Self-pay | Admitting: Internal Medicine

## 2021-08-23 NOTE — Telephone Encounter (Signed)
Pt is due colonoscopy in December. Does he need OV or NV

## 2021-08-24 ENCOUNTER — Encounter: Payer: Self-pay | Admitting: Internal Medicine

## 2021-08-26 DIAGNOSIS — J449 Chronic obstructive pulmonary disease, unspecified: Secondary | ICD-10-CM | POA: Diagnosis not present

## 2021-08-26 DIAGNOSIS — E785 Hyperlipidemia, unspecified: Secondary | ICD-10-CM | POA: Diagnosis not present

## 2021-08-26 DIAGNOSIS — E1129 Type 2 diabetes mellitus with other diabetic kidney complication: Secondary | ICD-10-CM | POA: Diagnosis not present

## 2021-08-26 DIAGNOSIS — E1149 Type 2 diabetes mellitus with other diabetic neurological complication: Secondary | ICD-10-CM | POA: Diagnosis not present

## 2021-08-29 ENCOUNTER — Encounter: Payer: Self-pay | Admitting: *Deleted

## 2021-08-29 ENCOUNTER — Encounter: Payer: Self-pay | Admitting: Gastroenterology

## 2021-08-29 ENCOUNTER — Other Ambulatory Visit: Payer: Self-pay | Admitting: *Deleted

## 2021-08-29 DIAGNOSIS — D509 Iron deficiency anemia, unspecified: Secondary | ICD-10-CM

## 2021-08-29 NOTE — Telephone Encounter (Signed)
There is a separate telephone note from Dr. Melvyn Novas (9/26) stating patient is cleared for surgery from pulmonary perspective.   [facility] Clinical Pool:  Please arrange colonoscopy with propofol with Dr. Gala Romney in the near future. Dx: History of adenomatous colon polyps. ASA III  1 day prior to procedure: Take one half dose of Lantus (20 units), one half dose of metformin (500 mg twice daily), take glipizide as prescribed. Day of procedure: No diabetes medications morning of procedure.

## 2021-08-29 NOTE — Telephone Encounter (Addendum)
Looks like pt has been cleared from pulmonologist standpoint per 08/22/2021 note.  Pt may end up needing the appt for a follow up after his procedure.  We can review procedure note just to ensure this or we can inform him to cancel it until after his procedure has been completed.

## 2021-08-29 NOTE — Telephone Encounter (Signed)
Pt has been cleared by pulmonology for colonoscopy. Please see 08/22/2021 note.

## 2021-08-29 NOTE — Telephone Encounter (Signed)
Pt was already seen on 06/22/21 to discuss having a colonoscopy. We are waiting on clearance from his pulmonologist.

## 2021-08-29 NOTE — Telephone Encounter (Signed)
Will call pt to schedule once we receive Dr. Rourk's future schedule 

## 2021-08-29 NOTE — Telephone Encounter (Signed)
Fowarding to [facility] for orders and to review

## 2021-09-08 ENCOUNTER — Other Ambulatory Visit: Payer: Self-pay

## 2021-09-08 MED ORDER — PEG 3350-KCL-NA BICARB-NACL 420 G PO SOLR: 4000.0000 mL | ORAL | 0 refills | Status: DC

## 2021-09-08 NOTE — Telephone Encounter (Signed)
Pre-op appt 09/27/21. Appt letter mailed with procedure instructions.

## 2021-09-08 NOTE — Telephone Encounter (Signed)
Called pt, informed him pulmonary gave clearance for TCS. TCS scheduled for 09/29/21 at 9:00am. Rx for prep sent to pharmacy. Orders entered.  PA for TCS submitted via [employer] website. [employer]# [id number], valid 09/29/21-10/29/21.

## 2021-09-09 DIAGNOSIS — D509 Iron deficiency anemia, unspecified: Secondary | ICD-10-CM | POA: Diagnosis not present

## 2021-09-10 LAB — IRON,TIBC AND FERRITIN PANEL
Ferritin: 47 ng/mL (ref 30–400)
Iron Saturation: 28 % (ref 15–55)
Iron: 81 ug/dL (ref 38–169)
Total Iron Binding Capacity: 287 ug/dL (ref 250–450)
UIBC: 206 ug/dL (ref 111–343)

## 2021-09-26 DIAGNOSIS — J449 Chronic obstructive pulmonary disease, unspecified: Secondary | ICD-10-CM | POA: Diagnosis not present

## 2021-09-26 DIAGNOSIS — E785 Hyperlipidemia, unspecified: Secondary | ICD-10-CM | POA: Diagnosis not present

## 2021-09-26 DIAGNOSIS — E1149 Type 2 diabetes mellitus with other diabetic neurological complication: Secondary | ICD-10-CM | POA: Diagnosis not present

## 2021-09-26 DIAGNOSIS — E1129 Type 2 diabetes mellitus with other diabetic kidney complication: Secondary | ICD-10-CM | POA: Diagnosis not present

## 2021-09-26 NOTE — Patient Instructions (Signed)
Christian Woodard  09/26/2021     @PREFPERIOPPHARMACY @   Your procedure is scheduled on  09/29/2021.   Report to [facility] at  0730 A.M.   Call this number if you have problems the morning of surgery:  [phone]   Remember:  Follow the diet and prep instructions given to you by the office.    Take these medicines the morning of surgery with A SIP OF WATER                 gabapentin, protonix.    Do not wear jewelry, make-up or nail polish.  Do not wear lotions, powders, or perfumes, or deodorant.  Do not shave 48 hours prior to surgery.  Men may shave face and neck.  Do not bring valuables to the hospital.  [facility] is not responsible for any belongings or valuables.  Contacts, dentures or bridgework may not be worn into surgery.  Leave your suitcase in the car.  After surgery it may be brought to your room.  For patients admitted to the hospital, discharge time will be determined by your treatment team.  Patients discharged the day of surgery will not be allowed to drive home and must have someone with them for 24 hours.    Special instructions:  DO NOT smoke tobacco or vape for 24 hours before your procedure.  Please read over the following fact sheets that you were given. Anesthesia Post-op Instructions and Care and Recovery After Surgery      Colonoscopy, Adult, Care After This sheet gives you information about how to care for yourself after your procedure. Your health care provider may also give you more specific instructions. If you have problems or questions, contact your health care provider. What can I expect after the procedure? After the procedure, it is common to have: A small amount of blood in your stool for 24 hours after the procedure. Some gas. Mild cramping or bloating of your abdomen. Follow these instructions at home: Eating and drinking  Drink enough fluid to keep your urine pale yellow. Follow instructions from your health  care provider about eating or drinking restrictions. Resume your normal diet as instructed by your health care provider. Avoid heavy or fried foods that are hard to digest. Activity Rest as told by your health care provider. Avoid sitting for a long time without moving. Get up to take short walks every 1-2 hours. This is important to improve blood flow and breathing. Ask for help if you feel weak or unsteady. Return to your normal activities as told by your health care provider. Ask your health care provider what activities are safe for you. Managing cramping and bloating  Try walking around when you have cramps or feel bloated. Apply heat to your abdomen as told by your health care provider. Use the heat source that your health care provider recommends, such as a moist heat pack or a heating pad. Place a towel between your skin and the heat source. Leave the heat on for 20-30 minutes. Remove the heat if your skin turns bright red. This is especially important if you are unable to feel pain, heat, or cold. You may have a greater risk of getting burned. General instructions If you were given a sedative during the procedure, it can affect you for several hours. Do not drive or operate machinery until your health care provider says that it is safe. For the first 24 hours after the  procedure: Do not sign important documents. Do not drink alcohol. Do your regular daily activities at a slower pace than normal. Eat soft foods that are easy to digest. Take over-the-counter and prescription medicines only as told by your health care provider. Keep all follow-up visits as told by your health care provider. This is important. Contact a health care provider if: You have blood in your stool 2-3 days after the procedure. Get help right away if you have: More than a small spotting of blood in your stool. Large blood clots in your stool. Swelling of your abdomen. Nausea or vomiting. A fever. Increasing  pain in your abdomen that is not relieved with medicine. Summary After the procedure, it is common to have a small amount of blood in your stool. You may also have mild cramping and bloating of your abdomen. If you were given a sedative during the procedure, it can affect you for several hours. Do not drive or operate machinery until your health care provider says that it is safe. Get help right away if you have a lot of blood in your stool, nausea or vomiting, a fever, or increased pain in your abdomen. This information is not intended to replace advice given to you by your health care provider. Make sure you discuss any questions you have with your health care provider. Document Revised: 11/07/2019 Document Reviewed: 06/09/2019 Elsevier Patient Education  [place] After This sheet gives you information about how to care for yourself after your procedure. Your health care provider may also give you more specific instructions. If you have problems or questions, contact your health care provider. What can I expect after the procedure? After the procedure, it is common to have: Tiredness. Forgetfulness about what happened after the procedure. Impaired judgment for important decisions. Nausea or vomiting. Some difficulty with balance. Follow these instructions at home: For the time period you were told by your health care provider:   Rest as needed. Do not participate in activities where you could fall or become injured. Do not drive or use machinery. Do not drink alcohol. Do not take sleeping pills or medicines that cause drowsiness. Do not make important decisions or sign legal documents. Do not take care of children on your own. Eating and drinking Follow the diet that is recommended by your health care provider. Drink enough fluid to keep your urine pale yellow. If you vomit: Drink water, juice, or soup when you can drink without  vomiting. Make sure you have little or no nausea before eating solid foods. General instructions Have a responsible adult stay with you for the time you are told. It is important to have someone help care for you until you are awake and alert. Take over-the-counter and prescription medicines only as told by your health care provider. If you have sleep apnea, surgery and certain medicines can increase your risk for breathing problems. Follow instructions from your health care provider about wearing your sleep device: Anytime you are sleeping, including during daytime naps. While taking prescription pain medicines, sleeping medicines, or medicines that make you drowsy. Avoid smoking. Keep all follow-up visits as told by your health care provider. This is important. Contact a health care provider if: You keep feeling nauseous or you keep vomiting. You feel light-headed. You are still sleepy or having trouble with balance after 24 hours. You develop a rash. You have a fever. You have redness or swelling around the IV site. Get help right  away if: You have trouble breathing. You have new-onset confusion at home. Summary For several hours after your procedure, you may feel tired. You may also be forgetful and have poor judgment. Have a responsible adult stay with you for the time you are told. It is important to have someone help care for you until you are awake and alert. Rest as told. Do not drive or operate machinery. Do not drink alcohol or take sleeping pills. Get help right away if you have trouble breathing, or if you suddenly become confused. This information is not intended to replace advice given to you by your health care provider. Make sure you discuss any questions you have with your health care provider. Document Revised: 07/29/2020 Document Reviewed: 10/16/2019 Elsevier Patient Education  2022 [employer].

## 2021-09-27 ENCOUNTER — Other Ambulatory Visit: Payer: Self-pay

## 2021-09-27 ENCOUNTER — Encounter (HOSPITAL_COMMUNITY)
Admission: RE | Admit: 2021-09-27 | Discharge: 2021-09-27 | Disposition: A | Discharge status: Home / Self Care | Payer: Medicare HMO | Source: Ambulatory Visit | Attending: Internal Medicine | Admitting: Internal Medicine

## 2021-09-27 ENCOUNTER — Encounter (HOSPITAL_COMMUNITY): Payer: Self-pay

## 2021-09-27 VITALS — Ht 66.0 in | Wt 167.0 lb

## 2021-09-27 DIAGNOSIS — R195 Other fecal abnormalities: Secondary | ICD-10-CM | POA: Diagnosis not present

## 2021-09-27 DIAGNOSIS — E119 Type 2 diabetes mellitus without complications: Secondary | ICD-10-CM

## 2021-09-27 DIAGNOSIS — Z01818 Encounter for other preprocedural examination: Secondary | ICD-10-CM | POA: Insufficient documentation

## 2021-09-27 DIAGNOSIS — D509 Iron deficiency anemia, unspecified: Secondary | ICD-10-CM | POA: Diagnosis not present

## 2021-09-27 LAB — BASIC METABOLIC PANEL
Anion gap: 10 (ref 5–15)
BUN: 18 mg/dL (ref 8–23)
CO2: 24 mmol/L (ref 22–32)
Calcium: 9.1 mg/dL (ref 8.9–10.3)
Chloride: 104 mmol/L (ref 98–111)
Creatinine, Ser: 1.08 mg/dL (ref 0.61–1.24)
GFR, Estimated: >60 mL/min (ref >60)
Glucose, Bld: 178 mg/dL — ABNORMAL HIGH (ref 70–99)
Potassium: 3.8 mmol/L (ref 3.5–5.1)
Sodium: 138 mmol/L (ref 135–145)

## 2021-09-27 LAB — CBC WITH DIFFERENTIAL/PLATELET
Abs Immature Granulocytes: 0.04 10*3/uL (ref 0.00–0.07)
Basophils Absolute: 0.1 10*3/uL (ref 0.0–0.1)
Basophils Relative: 2 %
Eosinophils Absolute: 0.2 10*3/uL (ref 0.0–0.5)
Eosinophils Relative: 3 %
HCT: 42.7 % (ref 39.0–52.0)
Hemoglobin: 14.1 g/dL (ref 13.0–17.0)
Immature Granulocytes: 1 %
Lymphocytes Relative: 9 %
Lymphs Abs: 0.7 10*3/uL (ref 0.7–4.0)
MCH: 30.8 pg (ref 26.0–34.0)
MCHC: 33 g/dL (ref 30.0–36.0)
MCV: 93.2 fL (ref 80.0–100.0)
Monocytes Absolute: 0.9 10*3/uL (ref 0.1–1.0)
Monocytes Relative: 12 %
Neutro Abs: 5.6 10*3/uL (ref 1.7–7.7)
Neutrophils Relative %: 73 %
Platelets: 297 10*3/uL (ref 150–400)
RBC: 4.58 MIL/uL (ref 4.22–5.81)
RDW: 14.5 % (ref 11.5–15.5)
WBC: 7.6 10*3/uL (ref 4.0–10.5)
nRBC: 0 % (ref 0.0–0.2)

## 2021-09-27 LAB — IRON AND TIBC
Iron: 96 ug/dL (ref 45–182)
Saturation Ratios: 30 % (ref 17.9–39.5)
TIBC: 323 ug/dL (ref 250–450)
UIBC: 227 ug/dL

## 2021-09-27 LAB — FERRITIN: Ferritin: 20 ng/mL — ABNORMAL LOW (ref 24–336)

## 2021-09-29 ENCOUNTER — Other Ambulatory Visit: Payer: Self-pay

## 2021-09-29 ENCOUNTER — Encounter (HOSPITAL_COMMUNITY): Payer: Self-pay | Admitting: Internal Medicine

## 2021-09-29 ENCOUNTER — Ambulatory Visit (HOSPITAL_COMMUNITY)
Admission: RE | Admit: 2021-09-29 | Discharge: 2021-09-29 | Disposition: A | Discharge status: Home / Self Care | Payer: Medicare HMO | Attending: Internal Medicine | Admitting: Internal Medicine

## 2021-09-29 ENCOUNTER — Encounter (HOSPITAL_COMMUNITY)
Admission: RE | Disposition: A | Discharge status: Home / Self Care | Payer: Self-pay | Source: Home / Self Care | Attending: Internal Medicine

## 2021-09-29 ENCOUNTER — Ambulatory Visit (HOSPITAL_COMMUNITY): Payer: Medicare HMO | Admitting: Anesthesiology

## 2021-09-29 DIAGNOSIS — E119 Type 2 diabetes mellitus without complications: Secondary | ICD-10-CM | POA: Diagnosis not present

## 2021-09-29 DIAGNOSIS — Z7982 Long term (current) use of aspirin: Secondary | ICD-10-CM | POA: Insufficient documentation

## 2021-09-29 DIAGNOSIS — Z825 Family history of asthma and other chronic lower respiratory diseases: Secondary | ICD-10-CM | POA: Insufficient documentation

## 2021-09-29 DIAGNOSIS — D123 Benign neoplasm of transverse colon: Secondary | ICD-10-CM | POA: Diagnosis not present

## 2021-09-29 DIAGNOSIS — D126 Benign neoplasm of colon, unspecified: Secondary | ICD-10-CM

## 2021-09-29 DIAGNOSIS — Z7984 Long term (current) use of oral hypoglycemic drugs: Secondary | ICD-10-CM | POA: Diagnosis not present

## 2021-09-29 DIAGNOSIS — Z801 Family history of malignant neoplasm of trachea, bronchus and lung: Secondary | ICD-10-CM | POA: Diagnosis not present

## 2021-09-29 DIAGNOSIS — Z8249 Family history of ischemic heart disease and other diseases of the circulatory system: Secondary | ICD-10-CM | POA: Insufficient documentation

## 2021-09-29 DIAGNOSIS — K635 Polyp of colon: Secondary | ICD-10-CM | POA: Diagnosis not present

## 2021-09-29 DIAGNOSIS — Z1211 Encounter for screening for malignant neoplasm of colon: Secondary | ICD-10-CM | POA: Insufficient documentation

## 2021-09-29 DIAGNOSIS — Z86010 Personal history of colonic polyps: Secondary | ICD-10-CM | POA: Diagnosis not present

## 2021-09-29 DIAGNOSIS — K5730 Diverticulosis of large intestine without perforation or abscess without bleeding: Secondary | ICD-10-CM | POA: Diagnosis not present

## 2021-09-29 DIAGNOSIS — Z79899 Other long term (current) drug therapy: Secondary | ICD-10-CM | POA: Diagnosis not present

## 2021-09-29 DIAGNOSIS — Z87891 Personal history of nicotine dependence: Secondary | ICD-10-CM | POA: Insufficient documentation

## 2021-09-29 HISTORY — PX: POLYPECTOMY: SHX5525

## 2021-09-29 HISTORY — PX: COLONOSCOPY WITH PROPOFOL: SHX5780

## 2021-09-29 LAB — GLUCOSE, CAPILLARY: Glucose-Capillary: 111 mg/dL — ABNORMAL HIGH (ref 70–99)

## 2021-09-29 SURGERY — COLONOSCOPY WITH PROPOFOL
Anesthesia: General

## 2021-09-29 MED ORDER — PROPOFOL 1000 MG/100ML IV EMUL
INTRAVENOUS | Status: AC
  Filled 2021-09-29: qty 100

## 2021-09-29 MED ORDER — PHENYLEPHRINE HCL (PRESSORS) 10 MG/ML IV SOLN
INTRAVENOUS | Status: DC | PRN
  Administered 2021-09-29 (×4): 120 ug via INTRAVENOUS

## 2021-09-29 MED ORDER — LACTATED RINGERS IV SOLN: INTRAVENOUS | Status: DC | PRN

## 2021-09-29 MED ORDER — PROPOFOL 10 MG/ML IV BOLUS
INTRAVENOUS | Status: DC | PRN
  Administered 2021-09-29: 100 mg via INTRAVENOUS

## 2021-09-29 MED ORDER — PROPOFOL 500 MG/50ML IV EMUL
INTRAVENOUS | Status: DC | PRN
  Administered 2021-09-29: 150 ug/kg/min via INTRAVENOUS

## 2021-09-29 MED ORDER — STERILE WATER FOR IRRIGATION IR SOLN
Status: DC | PRN
  Administered 2021-09-29: .6 mL

## 2021-09-29 NOTE — Discharge Instructions (Addendum)
  Colonoscopy Discharge Instructions  Read the instructions outlined below and refer to this sheet in the next few weeks. These discharge instructions provide you with general information on caring for yourself after you leave the hospital. Your doctor may also give you specific instructions. While your treatment has been planned according to the most current medical practices available, unavoidable complications occasionally occur. If you have any problems or questions after discharge, call Dr. Gala Romney at [phone]. ACTIVITY You may resume your regular activity, but move at a slower pace for the next 24 hours.  Take frequent rest periods for the next 24 hours.  Walking will help get rid of the air and reduce the bloated feeling in your belly (abdomen).  No driving for 24 hours (because of the medicine (anesthesia) used during the test).   Do not sign any important legal documents or operate any machinery for 24 hours (because of the anesthesia used during the test).  NUTRITION Drink plenty of fluids.  You may resume your normal diet as instructed by your doctor.  Begin with a light meal and progress to your normal diet. Heavy or fried foods are harder to digest and may make you feel sick to your stomach (nauseated).  Avoid alcoholic beverages for 24 hours or as instructed.  MEDICATIONS You may resume your normal medications unless your doctor tells you otherwise.  WHAT YOU CAN EXPECT TODAY Some feelings of bloating in the abdomen.  Passage of more gas than usual.  Spotting of blood in your stool or on the toilet paper.  IF YOU HAD POLYPS REMOVED DURING THE COLONOSCOPY: No aspirin products for 7 days or as instructed.  No alcohol for 7 days or as instructed.  Eat a soft diet for the next 24 hours.  FINDING OUT THE RESULTS OF YOUR TEST Not all test results are available during your visit. If your test results are not back during the visit, make an appointment with your caregiver to find out the  results. Do not assume everything is normal if you have not heard from your caregiver or the medical facility. It is important for you to follow up on all of your test results.  SEEK IMMEDIATE MEDICAL ATTENTION IF: You have more than a spotting of blood in your stool.  Your belly is swollen (abdominal distention).  You are nauseated or vomiting.  You have a temperature over 101.  You have abdominal pain or discomfort that is severe or gets worse throughout the day.    1 polyp removed in your colon today  Polyp and diverticulosis information provided  Further recommendations to follow pending review of pathology report  As previously discussed, I strongly recommend you follow-up with your doctors at [facility] regarding your Barrett's esophagus  At patient request I called Ancel Easler at [phone] - Reviewed result

## 2021-09-29 NOTE — H&P (Signed)
@LOGO @   Primary Care Physician:  Asencion Noble, MD Primary Gastroenterologist:  Dr. Gala Romney  Pre-Procedure History & Physical: HPI:  Christian Woodard is a 75 y.o. male here for surveillance colonoscopy.  History of colonic adenomas removed previously.  Also history of recurrent iron deficiency.  Patient asymptomatic from a GI standpoint.  Past Medical History:  Diagnosis Date   Arthritis    Barrett's esophagus    With low-grade dysplasia, following with [facility]   Diabetes mellitus    GERD (gastroesophageal reflux disease)    Hypercholesteremia     Past Surgical History:  Procedure Laterality Date   BACK SURGERY  2000   CHOLECYSTECTOMY     COLONOSCOPY  09/15/2011   Dr. Gala Romney: Suboptimal prep. Pancolonic diverticulosis. Tubular adenomas. Next colonoscopy 5 years.   COLONOSCOPY N/A 09/22/2016   Surgeon: Daneil Dolin, MD; pancolonic diverticulosis, otherwise normal exam.  Recommended repeat in 5 years.   ESOPHAGOGASTRODUODENOSCOPY N/A 08/31/2017   Surgeon: Daneil Dolin, MD;  Barrett's esophagus with low-grade dysplasia, small hiatal hernia with abnormal gastric mucosa biopsied (no H. pylori), normal examined duodenum.     ESOPHAGOGASTRODUODENOSCOPY  11/2018   [facility]; Barrett's esophagus, negative for dysplasia   ESOPHAGOGASTRODUODENOSCOPY  01/2020   [facility]; Barrett's esophagus, indefinite for focal low-grade dysplasia.   GIVENS CAPSULE STUDY N/A 10/26/2017   Surgeon: Daneil Dolin, MD; localized clustered areas of white "blunted appearing" abnormal small bowel mucosa/villi of uncertain significance.  Follow-up CTE and small bowel enteroscopy at [facility] without significant findings.    HERNIA REPAIR     as a child   left cornea transplant Left    Right cornea transplant     SMALL BOWEL ENTEROSCOPY  11/2017   [facility]; Barrett's esophagus focally positive for low-grade dysplasia, normal stomach, normal duodenum, normal jejunum.  Small bowel biopsies were benign.   TONSILLECTOMY      Prior to  Admission medications   Medication Sig Start Date End Date Taking? Authorizing Provider  acetaminophen (TYLENOL) 500 MG tablet Take 1,000 mg by mouth every 6 (six) hours as needed (headaches/pain.).   Yes [provider]  aspirin EC 81 MG tablet Take 81 mg by mouth every evening. Swallow whole.   Yes [provider]  atorvastatin (LIPITOR) 10 MG tablet Take 10 mg by mouth at bedtime.   Yes [provider]  chlorpheniramine (CHLOR-TRIMETON) 4 MG tablet Take 4 mg by mouth in the morning and at bedtime.   Yes [provider]  famotidine (PEPCID) 20 MG tablet Take 20 mg by mouth daily after supper. Takes after supper   Yes [provider]  ferrous sulfate 325 (65 FE) MG tablet Take 325 mg by mouth daily with breakfast.   Yes [provider]  gabapentin (NEURONTIN) 100 MG capsule Take 1 capsule by mouth in the morning, at noon, and at bedtime. 10/08/20  Yes [provider]  glipiZIDE (GLUCOTROL XL) 5 MG 24 hr tablet Take 5 mg by mouth daily with breakfast.   Yes [provider]  LANTUS SOLOSTAR 100 UNIT/ML Solostar Pen Inject 40 Units into the skin in the morning. 08/23/16  Yes [provider]  losartan (COZAAR) 50 MG tablet Take 50 mg by mouth in the morning.   Yes [provider]  metFORMIN (GLUCOPHAGE) 1000 MG tablet Take 1,000 mg by mouth 2 (two) times daily. 07/22/16  Yes [provider]  pantoprazole (PROTONIX) 40 MG tablet TAKE 1 TABLET BY MOUTH ONCE DAILY 30  TO  60  MINUTES  BEFORE  BREAKFAST 06/01/21  Yes Annitta Needs, NP  polyethylene glycol-electrolytes (TRILYTE) 420 g solution Take 4,000 mLs by mouth as directed. 09/08/21   Daneil Dolin, MD    Allergies as of 09/08/2021   (No Known Allergies)    Family History  Problem Relation Age of Onset   Heart disease Mother    Heart disease Father    Emphysema Father        smoked   Lung cancer Father        smoked   Colon cancer Neg Hx      Social History   Socioeconomic History   Marital status: Married    Spouse name: Not on file   Number of children: Not on file   Years of education: Not on file   Highest education level: Not on file  Occupational History   Not on file  Tobacco Use   Smoking status: Former    Packs/day: 2.00    Years: 36.00    Pack years: 72.00    Types: Cigarettes    Quit date: 11/27/1998    Years since quitting: 22.8   Smokeless tobacco: Never  Vaping Use   Vaping Use: Never used  Substance and Sexual Activity   Alcohol use: No   Drug use: No   Sexual activity: Not on file  Other Topics Concern   Not on file  Social History Narrative   Not on file   Social Determinants of Health   Financial Resource Strain: Not on file  Food Insecurity: Not on file  Transportation Needs: Not on file  Physical Activity: Not on file  Stress: Not on file  Social Connections: Not on file  Intimate Partner Violence: Not on file    Review of Systems: See HPI, otherwise negative ROS  Physical Exam: There were no vitals taken for this visit. General:   Alert,  Well-developed, well-nourished, pleasant and cooperative in NAD vical adenopathy. Lungs:  Clear throughout to auscultation.   No wheezes, crackles, or rhonchi. No acute distress. Heart:  Regular rate and rhythm; no murmurs, clicks, rubs,  or gallops. Abdomen: Non-distended, normal bowel sounds.  Soft and nontender without appreciable mass or hepatosplenomegaly.  Pulses:  Normal pulses noted. Extremities:  Without clubbing or edema.  Impression/Plan: 75 year old gentleman with a history of colonic adenomas and a history of recurrent iron deficiency.  He is here for surveillance colonoscopy per plan. I also stressed the importance of appropriate follow-up at [facility] regarding Barrett's esophagus and dysplasia.  He is overdue.  I strongly recommended he follow-up there.  Further recommendations to follow after colonoscopy is been completed   The  risks, benefits, limitations, alternatives and imponderables have been reviewed with the patient. Questions have been answered. All parties are agreeable.       Notice: This dictation was prepared with Dragon dictation along with smaller phrase technology. Any transcriptional errors that result from this process are unintentional and may not be corrected upon review.

## 2021-09-29 NOTE — Transfer of Care (Signed)
Immediate Anesthesia Transfer of Care Note  Patient: Christian Woodard  Procedure(s) Performed: COLONOSCOPY WITH PROPOFOL POLYPECTOMY  Patient Location: PACU  Anesthesia Type:General  Level of Consciousness: awake, alert , oriented and patient cooperative  Airway & Oxygen Therapy: Patient Spontanous Breathing  Post-op Assessment: Report given to RN, Post -op Vital signs reviewed and stable and Patient moving all extremities X 4  Post vital signs: Reviewed and stable  Last Vitals:  Vitals Value Taken Time  BP 97/53 09/29/21 0925  Temp 36.7 C 09/29/21 0925  Pulse 90 09/29/21 0925  Resp 18 09/29/21 0925  SpO2 95 % 09/29/21 0925    Last Pain:  Vitals:   09/29/21 0925  TempSrc: Oral  PainSc: 0-No pain         Complications: No notable events documented.

## 2021-09-29 NOTE — Op Note (Signed)
[facility] Patient Name: Christian Woodard Procedure Date: 09/29/2021 7:20 AM MRN: [id number] Date of Birth: October 10, 1946 Attending MD: Norvel Richards , MD CSN: [id number] Age: 75 Admit Type: Outpatient Procedure:                Colonoscopy Indications:              High risk colon cancer surveillance: Personal                            history of colonic polyps; Recurrent iron                            deficiency with most recent ferritin 20 Providers:                Norvel Richards, MD, Janeece Riggers, RN, Randa Spike, Technician Referring MD:              Medicines:                Propofol per Anesthesia Complications:            No immediate complications. Estimated Blood Loss:     Estimated blood loss was minimal. Estimated blood                            loss was minimal. Procedure:                Pre-Anesthesia Assessment:                           - Prior to the procedure, a History and Physical                            was performed, and patient medications and                            allergies were reviewed. The patient's tolerance of                            previous anesthesia was also reviewed. The risks                            and benefits of the procedure and the sedation                            options and risks were discussed with the patient.                            All questions were answered, and informed consent                            was obtained. Prior Anticoagulants: The patient has  taken no previous anticoagulant or antiplatelet                            agents. ASA Grade Assessment: III - A patient with                            severe systemic disease. After reviewing the risks                            and benefits, the patient was deemed in                            satisfactory condition to undergo the procedure.                           After obtaining  informed consent, the colonoscope                            was passed under direct vision. Throughout the                            procedure, the patient's blood pressure, pulse, and                            oxygen saturations were monitored continuously.The                            colonoscopy was performed without difficulty. The                            patient tolerated the procedure well. The quality                            of the bowel preparation was adequate. The                            ileocecal valve, appendiceal orifice, and rectum                            were photographed. The entire colon was well                            visualized. The [id number] scope was                            introduced through the and advanced to the the                            cecum, identified by appendiceal orifice and                            ileocecal valve. Scope In: 8:59:52 AM Scope Out: 9:19:57 AM Scope Withdrawal Time: 0 hours 15 minutes 54 seconds  Total Procedure Duration: 0 hours 20 minutes 5 seconds  Findings:      The perianal and digital rectal examinations were normal.      A 7 mm polyp was found in the hepatic flexure. The polyp was       semi-pedunculated. The polyp was removed with a cold snare. Resection       and retrieval were complete. Estimated blood loss was minimal.      Multiple small and large-mouthed diverticula were found in the entire       colon. I attempted to intubate terminal ileum but was unable to do so       due to angulation. Impression:               - One 7 mm polyp at the hepatic flexure, removed                            with a cold snare. Resected and retrieved.                           - Diverticulosis in the entire examined colon.                           - The entire examined colon is normal on direct and                            retroflexion views. Moderate Sedation:      Moderate (conscious) sedation was  personally administered by an       anesthesia professional. The following parameters were monitored: oxygen       saturation, heart rate, blood pressure, respiratory rate, EKG, adequacy       of pulmonary ventilation, and response to care. Recommendation:           - Patient has a contact number available for                            emergencies. The signs and symptoms of potential                            delayed complications were discussed with the                            patient. Return to normal activities tomorrow.                            Written discharge instructions were provided to the                            patient.                           - Advance diet as tolerated.                           - Continue present medications.                           - Repeat colonoscopy date to be determined after  pending pathology results are reviewed for                            surveillance based on pathology results.                           - Return to GI office in 2 months. patient urged to                            follow-up with [facility] physicians regarding Barrett's                            esophagus. Procedure Code(s):        --- Professional ---                           [phone], Colonoscopy, flexible; with removal of                            tumor(s), polyp(s), or other lesion(s) by snare                            technique Diagnosis Code(s):        --- Professional ---                           Z86.010, Personal history of colonic polyps                           K63.5, Polyp of colon                           K57.30, Diverticulosis of large intestine without                            perforation or abscess without bleeding CPT copyright 2019 American Medical Association. All rights reserved. The codes documented in this report are preliminary and upon coder review may  be revised to meet current compliance requirements. Cristopher Estimable.  Maddyson Keil, MD Norvel Richards, MD 09/29/2021 9:37:24 AM This report has been signed electronically. Number of Addenda: 0

## 2021-09-29 NOTE — Anesthesia Preprocedure Evaluation (Signed)
Anesthesia Evaluation  Patient identified by MRN, date of birth, ID band Patient awake    Reviewed: Allergy & Precautions, H&P , NPO status , Patient's Chart, lab work & pertinent test results, reviewed documented beta blocker date and time   Airway Mallampati: II  TM Distance: >3 FB Neck ROM: full    Dental no notable dental hx.    Pulmonary shortness of breath, former smoker,    Pulmonary exam normal breath sounds clear to auscultation       Cardiovascular Exercise Tolerance: Good hypertension,  Rhythm:regular Rate:Normal     Neuro/Psych negative neurological ROS  negative psych ROS   GI/Hepatic Neg liver ROS, GERD  Medicated,  Endo/Other  negative endocrine ROSdiabetes  Renal/GU negative Renal ROS  negative genitourinary   Musculoskeletal   Abdominal   Peds  Hematology  (+) Blood dyscrasia, anemia ,   Anesthesia Other Findings   Reproductive/Obstetrics negative OB ROS                             Anesthesia Physical Anesthesia Plan  ASA: 2  Anesthesia Plan: General   Post-op Pain Management:    Induction:   PONV Risk Score and Plan: Propofol infusion  Airway Management Planned:   Additional Equipment:   Intra-op Plan:   Post-operative Plan:   Informed Consent: I have reviewed the patients History and Physical, chart, labs and discussed the procedure including the risks, benefits and alternatives for the proposed anesthesia with the patient or authorized representative who has indicated his/her understanding and acceptance.     Dental Advisory Given  Plan Discussed with: CRNA  Anesthesia Plan Comments:         Anesthesia Quick Evaluation

## 2021-09-30 LAB — SURGICAL PATHOLOGY

## 2021-09-30 NOTE — Anesthesia Postprocedure Evaluation (Signed)
Anesthesia Post Note  Patient: Christian Woodard  Procedure(s) Performed: COLONOSCOPY WITH PROPOFOL POLYPECTOMY  Patient location during evaluation: Phase II Anesthesia Type: General Level of consciousness: awake Pain management: pain level controlled Vital Signs Assessment: post-procedure vital signs reviewed and stable Respiratory status: spontaneous breathing and respiratory function stable Cardiovascular status: blood pressure returned to baseline and stable Postop Assessment: no headache and no apparent nausea or vomiting Anesthetic complications: no Comments: Late entry   No notable events documented.   Last Vitals:  Vitals:   09/29/21 0925 09/29/21 0930  BP: (!) 97/53 (!) 100/54  Pulse: 90   Resp: 18   Temp: 36.7 C   SpO2: 95%     Last Pain:  Vitals:   09/29/21 0925  TempSrc: Oral  PainSc: 0-No pain                 Louann Sjogren

## 2021-10-03 ENCOUNTER — Encounter: Payer: Self-pay | Admitting: Internal Medicine

## 2021-10-04 ENCOUNTER — Encounter (HOSPITAL_COMMUNITY): Payer: Self-pay | Admitting: Internal Medicine

## 2021-10-05 ENCOUNTER — Ambulatory Visit (HOSPITAL_COMMUNITY)
Admission: RE | Admit: 2021-10-05 | Discharge: 2021-10-05 | Disposition: A | Discharge status: Home / Self Care | Payer: Medicare HMO | Source: Ambulatory Visit | Attending: Internal Medicine | Admitting: Internal Medicine

## 2021-10-05 ENCOUNTER — Other Ambulatory Visit: Payer: Self-pay

## 2021-10-05 DIAGNOSIS — R0609 Other forms of dyspnea: Secondary | ICD-10-CM | POA: Diagnosis not present

## 2021-10-05 LAB — ECHOCARDIOGRAM COMPLETE
AR max vel: 2.12 cm2
AV Area VTI: 2.16 cm2
AV Area mean vel: 1.86 cm2
AV Mean grad: 3 mmHg
AV Peak grad: 5.6 mmHg
Ao pk vel: 1.18 m/s
Area-P 1/2: 3.34 cm2
Calc EF: 69.1 %
MV VTI: 2.08 cm2
S' Lateral: 2.3 cm
Single Plane A2C EF: 66.9 %
Single Plane A4C EF: 69.3 %

## 2021-10-05 NOTE — Progress Notes (Signed)
*  PRELIMINARY RESULTS* Echocardiogram 2D Echocardiogram has been performed.  Christian Woodard 10/05/2021, 2:55 PM

## 2021-10-11 DIAGNOSIS — E1129 Type 2 diabetes mellitus with other diabetic kidney complication: Secondary | ICD-10-CM | POA: Diagnosis not present

## 2021-10-18 DIAGNOSIS — J660 Byssinosis: Secondary | ICD-10-CM | POA: Diagnosis not present

## 2021-10-18 DIAGNOSIS — E1122 Type 2 diabetes mellitus with diabetic chronic kidney disease: Secondary | ICD-10-CM | POA: Diagnosis not present

## 2021-10-18 DIAGNOSIS — R7309 Other abnormal glucose: Secondary | ICD-10-CM | POA: Diagnosis not present

## 2021-10-26 DIAGNOSIS — E1149 Type 2 diabetes mellitus with other diabetic neurological complication: Secondary | ICD-10-CM | POA: Diagnosis not present

## 2021-10-26 DIAGNOSIS — E1129 Type 2 diabetes mellitus with other diabetic kidney complication: Secondary | ICD-10-CM | POA: Diagnosis not present

## 2021-10-26 DIAGNOSIS — J449 Chronic obstructive pulmonary disease, unspecified: Secondary | ICD-10-CM | POA: Diagnosis not present

## 2021-10-26 DIAGNOSIS — E785 Hyperlipidemia, unspecified: Secondary | ICD-10-CM | POA: Diagnosis not present

## 2021-10-27 IMAGING — CT CT CHEST HIGH RESOLUTION W/O CM
2 of 3 series · 14 of 36 positions shown, 17 images · non-contrast
Comparison: None.

CLINICAL DATA: Shortness of breath

EXAM:
CT CHEST WITHOUT CONTRAST
TECHNIQUE: Multidetector CT imaging of the chest was performed following the
standard protocol without intravenous contrast. High resolution
imaging of the lungs, as well as inspiratory and expiratory imaging,
was performed.

[Series 3: standard chest · axial · 0.72mm/px · z∈[+1189,+1443]mm · 11 of 151 slices shown, 14 images]
[im 12/151  mediastinal]
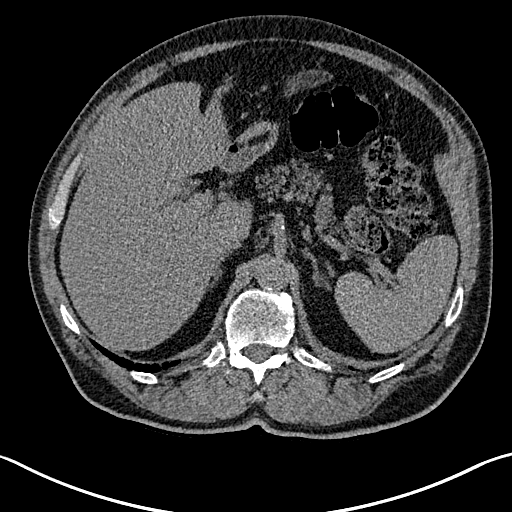
[im 12/151  lung]
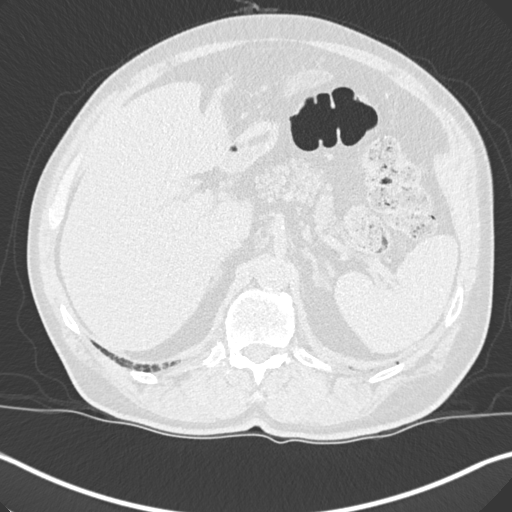
[im 23/151  lung]
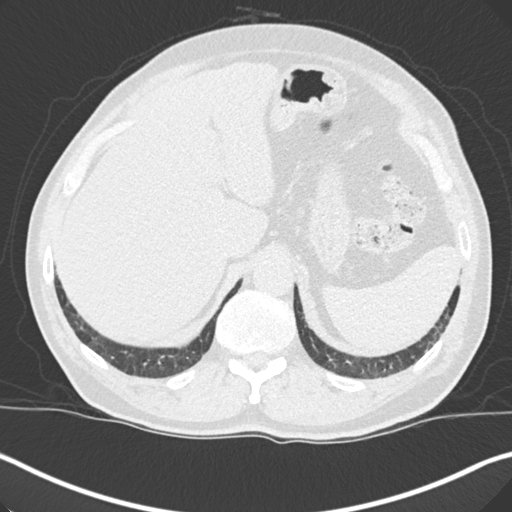
[im 34/151  lung]
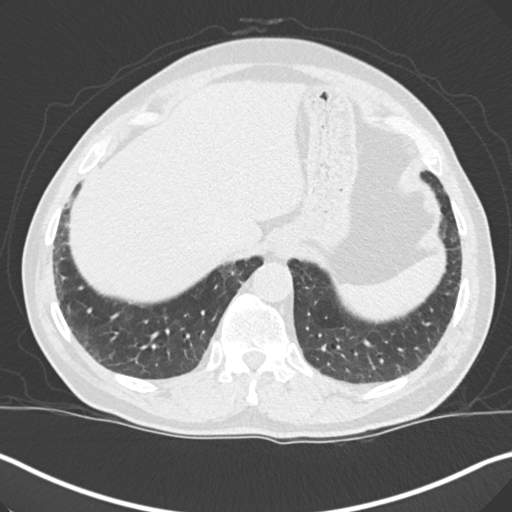
[im 51/151  lung]
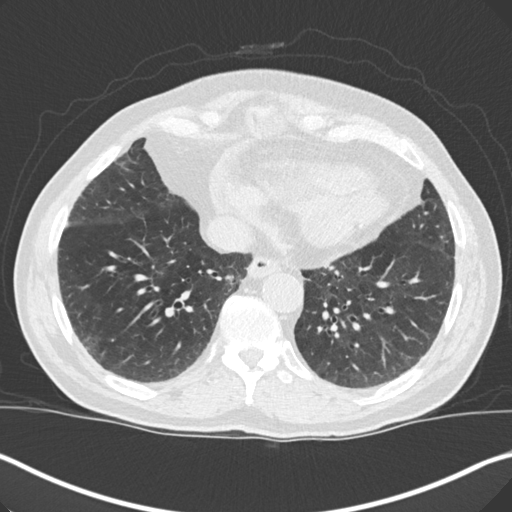
[im 62/151  mediastinal]
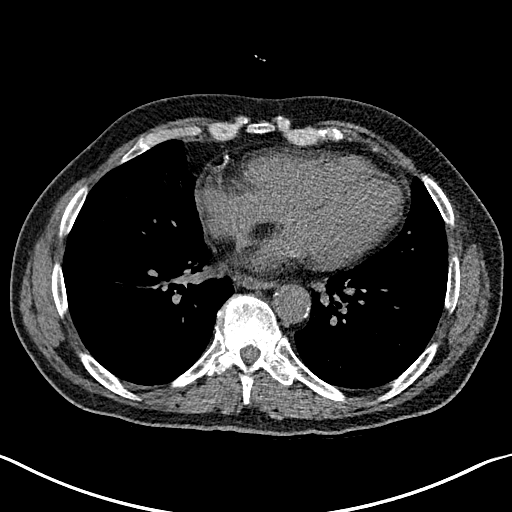
[im 62/151  lung]
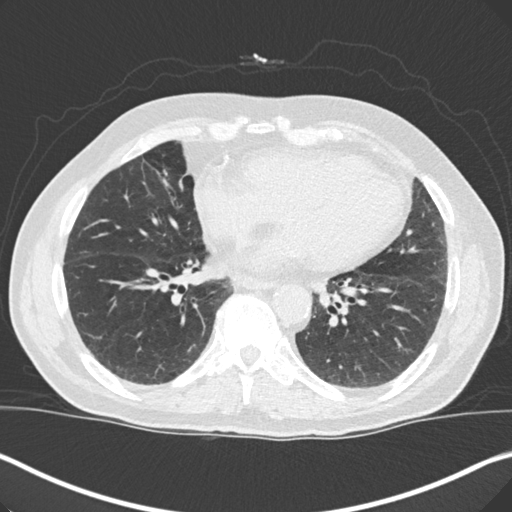
[im 78/151  lung]
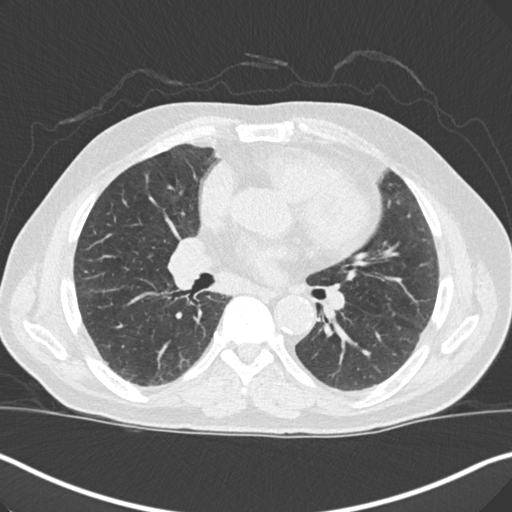
[im 89/151  lung]
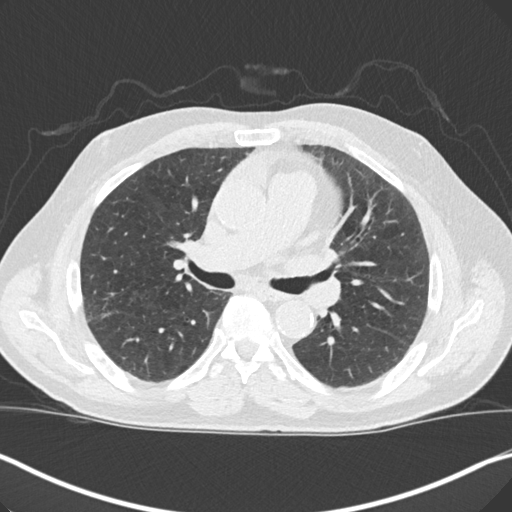
[im 101/151  lung]
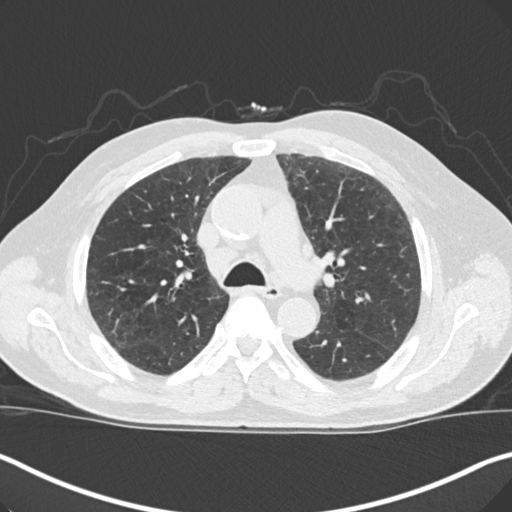
[im 117/151  mediastinal]
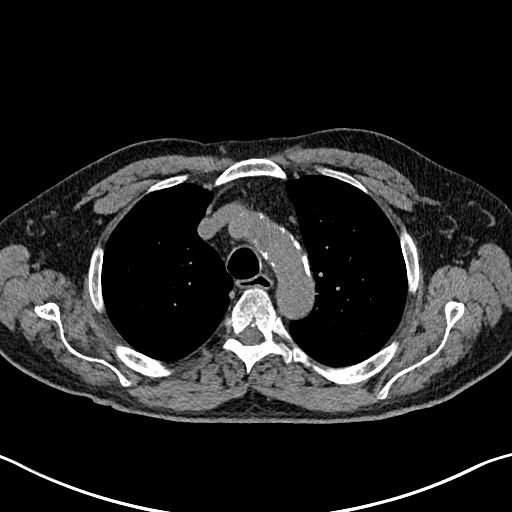
[im 117/151  lung]
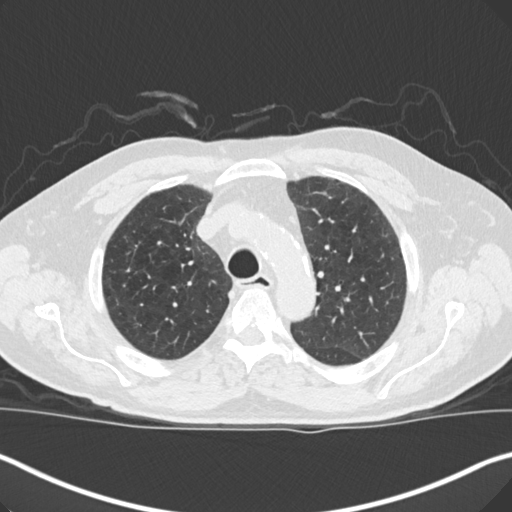
[im 128/151  lung]
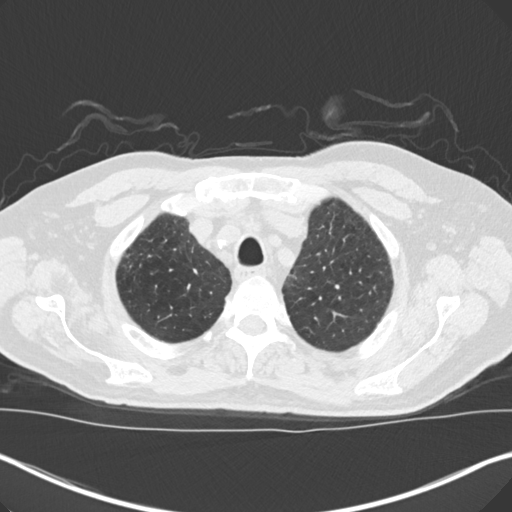
[im 139/151  lung]
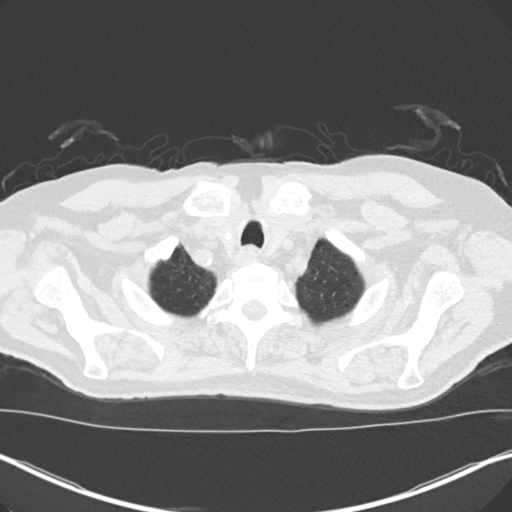

[Series 7: coronal · coronal · 0.60mm/px · 3 of 150 slices shown]
[im 30/150  lung]
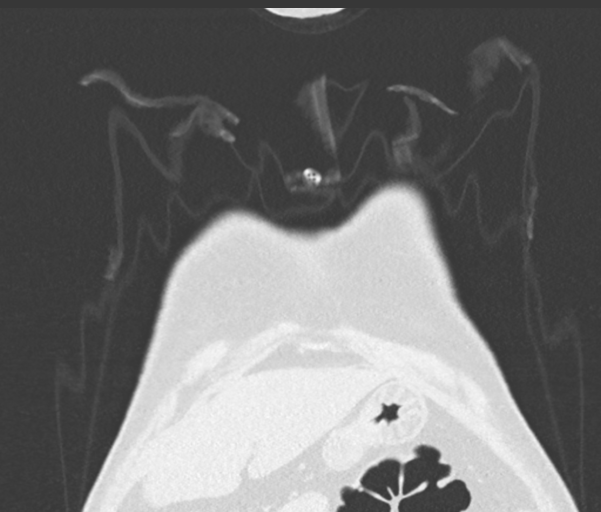
[im 60/150  lung]
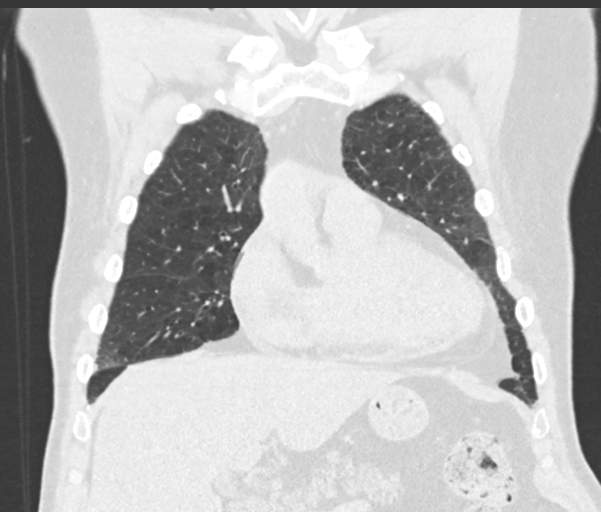
[im 90/150  lung]
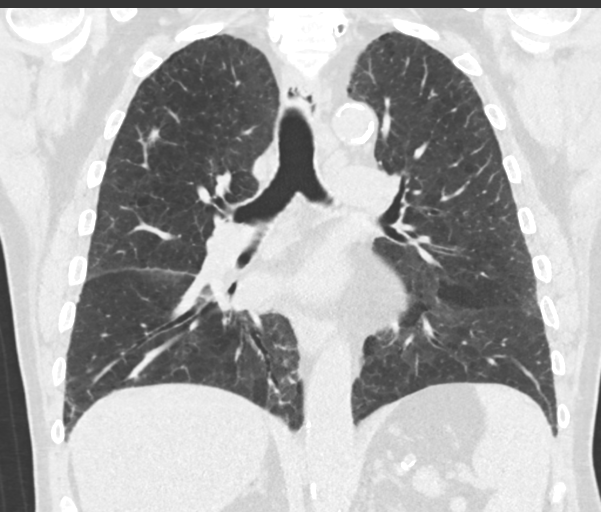

[14 of 36 positions shown; findings below may reference images not displayed]

FINDINGS: Cardiovascular: Aortic atherosclerosis. Normal heart size.
Three-vessel coronary artery calcifications. No pericardial
effusion.

Mediastinum/Nodes: Numerous prominent mediastinal and hilar lymph
nodes. Thyroid gland, trachea, and esophagus demonstrate no
significant findings.

Lungs/Pleura: Mild centrilobular emphysema. There is an irregular
nodule in the right upper lobe measuring 7 mm (series 6, image 77).
There is minimal irregular peripheral interstitial opacity most
notable at the lung bases and involving non dependent portions of
the right middle lobe and lingula (series 6, image 209, 186). Mild,
lobular air trapping on expiratory phase imaging. No pleural
effusion or pneumothorax.

Upper Abdomen: No acute abnormality.

Musculoskeletal: No chest wall mass or suspicious bone lesions
identified.
IMPRESSION: 1. There is minimal irregular peripheral interstitial opacity most
notable at the lung bases and involving non dependent portions of
the right middle lobe and lingula. Findings suggest minimal fibrosis
in an early "indeterminate for UIP" pattern by ATS pulmonary
fibrosis criteria, but are generally nonspecific. Differential
considerations include UIP, NSIP, and post infectious or
inflammatory scarring. Consider follow-up examination in 1 year to
assess for stability of findings and pattern if there is high
clinical concern for fibrotic interstitial lung disease. Findings
are indeterminate for UIP per consensus guidelines: Diagnosis of
Idiopathic Pulmonary Fibrosis: An Official ATS/ERS/JRS/ALAT Clinical
Practice Guideline. Am J Respir Crit Care Med Vol 198, Was[place] 5,
ppe11-e[DATE].
2. There is an irregular nodule in the right upper lobe measuring 7
mm. Non-contrast chest CT at 6-12 months is recommended (and could
be performed in conjunction with follow-up suggested above). If the
nodule is stable at time of repeat CT, then future CT at 18-24
months (from today's scan) is considered optional for low-risk
patients, but is recommended for high-risk patients. This
recommendation follows the consensus statement: Guidelines for
Management of Incidental Pulmonary Nodules Detected on CT Images:
3. Numerous prominent mediastinal and hilar lymph nodes, nonspecific
and likely reactive.
4. Mild, lobular air trapping on expiratory phase imaging,
suggestive of small airways disease.
5. Emphysema.
6. Coronary artery disease.

Aortic Atherosclerosis (X2MOF-JBM.M) and Emphysema (X2MOF-NPY.Q).

## 2021-11-22 ENCOUNTER — Other Ambulatory Visit: Payer: Self-pay

## 2021-11-22 ENCOUNTER — Encounter: Payer: Self-pay | Admitting: Internal Medicine

## 2021-11-22 ENCOUNTER — Ambulatory Visit: Payer: Medicare HMO | Admitting: Internal Medicine

## 2021-11-22 VITALS — BP 128/72 | HR 72 | Temp 98.2°F | Ht 66.0 in | Wt 174.0 lb

## 2021-11-22 DIAGNOSIS — R058 Other specified cough: Secondary | ICD-10-CM

## 2021-11-22 DIAGNOSIS — Z23 Encounter for immunization: Secondary | ICD-10-CM | POA: Diagnosis not present

## 2021-11-22 DIAGNOSIS — R0609 Other forms of dyspnea: Secondary | ICD-10-CM | POA: Diagnosis not present

## 2021-11-22 MED ORDER — GABAPENTIN 300 MG PO CAPS: 300.0000 mg | ORAL_CAPSULE | Freq: Three times a day (TID) | ORAL | 2 refills | Status: DC

## 2021-11-22 NOTE — Progress Notes (Signed)
Subjective:     Patient ID: Christian Woodard, male   DOB: January 28, 1946     MRN: [id number]    Brief patient profile:  15   yowm quit smoking 2000 with mild doe @ wt 156  and did fine until around 2013 gave up pushing mower and gradually downhill since referred to pulmonary clinic 06/14/2017 by Dr   Willey Blade   06/14/2017 1st [facility] office visit/ Christian Woodard  No resp rx  Chief Complaint  Patient presents with   Pulmonary Consult    Referred by Dr. Asencion Noble. Pt c/o SOB x 6 months, esp worse over the past 3 months. He states he gets out of breath walking an incline, such as to his mailbox. He states that he sometimes he wakes up in the night feeling SOB.  He also c/o non prod cough.   doe x 5 years,  Stops at top of incline on way back to house x 6 months  = MMRC1 = can walk nl pace, flat grade, can't hurry or go uphills or steps s sob   Never tried inhalers Worse in heat and humbidity  freq wakes with dry cough / chest tightness sits up and goes away  rec You do not have significant copd at this point and likely never will  Pantoprazole (protonix) 40 mg   Take  30-60 min before first meal of the day and Pepcid (famotidine)  20 mg one @  bedtime until return to office - this is the best way to tell whether stomach acid is contributing to your problem.   GERD diet     08/20/2017  f/u ov/Christian Woodard re: doe on gerd rx/ gi eval in progress for anemia/ no resp rx  Chief Complaint  Patient presents with   Follow-up    PFT's done today. Increased SOB for the past day. Chest feels heavy today.  continues to have noct spells of sob despite nl BNP and no evidence of chf on cxr or obvious anemia Tells me his hgb still tredning down and GI eval in progess rec You do not have any significant copd If you continue to have breathing issues after your anemia is corrected might consider trial off ACTOS next and /or cardiology evaluation    Admission date:  10/03/2019     Discharge Date:  10/07/2019    Discharge Diagnosis  AKI (acute kidney injury) ([place]) [N17.9] COVID-19 virus infection [U07.1]     Acute respiratory failure with hypoxia ([place])   Acute respiratory disease due to COVID-19 virus   Type 2 diabetes mellitus without complication ([place])   Essential hypertension    HPI: Christian Woodard is a 75 y.o. male with a history of diabetes, hyperlipidemia, hypertension.  Patient seen for shortness of breath, cough, and weakness that started a couple days PTA and gradually worsened.  He was tested 10 days PTA and got results earlier this week for Covid.  He was tested due to an outbreak at his church where he goes and flulike symptoms with body aches, decreased appetite, nausea.  Nonproductive cough for the past 2 days that is worse with exertion and better with rest.    Hospital Course  Acute hypoxic respiratory failure due to COVID-19  pneumonia -This has resolved, he is on room air on discharge, ambulated in the hallway where he sustained saturation more than 92% on room air, was encouraged to take his incentive spirometry flutter valve, and keep using with , rated with IV remdesivir x5  days during hospital stay, treated with Decadron, he will be discharged on another 5 days to finish total 10 days of treatment . -Chest x-ray significant for COVID-19 pneumonia. -Was encouraged to use incentive spirometry, flutter valve, and get out of bed to chair and to prone    Virtual Visit via Telephone Note 10/27/2019   Post covid  I connected with Christian Woodard on 10/27/19 at  2:20 PM EST(did not reach on 1st attempt) by telephone and verified that I am speaking with the correct person using two identifiers.   I discussed the limitations, risks, security and privacy concerns of performing an evaluation and management service by telephone and the availability of in person appointments. I also discussed with the patient that there may be a patient responsible charge related to this service. The  patient expressed understanding and agreed to proceed.    History of Present Illness: Dyspnea:  2 laps off 02 prior to discharge / trend is improving at home but not checking sats  Cough: minimal clear  Sleeping: flat/ one pillow SABA use: none 02: none  Dex last day 10/12/2019 rec Make sure you check your oxygen saturation at your highest level of activity to be sure it stays over 90% and keep track of it at least once a week, more often if breathing getting worse, and let me know if losing ground.>>> did not do       01/19/2021  f/u ov/[place] office/Christian Woodard re: doe p covid  Chief Complaint  Patient presents with   Follow-up    Breathing is overall doing well and no new co's today.    Dyspnea:  Up and down the hill to the mb daily feels it getting a little harder than it was about a month ago since feet started bothering him with paresthesias and lack of balance  Cough: none/ some p eating assoc with mild dysphagia ? Barrett's  Sleeping: flat bed / R side / 1 pillow SABA use: none  02: none Covid status: vax x 3  rec Stay as active as you can and call if losing ground with exercise tolerance  Pulmonary follow up is as directed by Dr Willey Blade  Late add:  Based on desats with no explanation and c/o worse ex tol > HRCT/pfts next step > no specific findings/ 7 mm nodule needs f/u @ 6-12 m    02/18/2021  f/u ov/Christian Woodard re: doe p covid Chief Complaint  Patient presents with   Follow-up    Non productive cough since last friday   Dyspnea:  Maybe 150 ft to MB some better  Cough: new c/o x one week of throat tickle but not sense of sinus problems or pnds or excess mucus  Sleeping: bed is mostly flat  SABA use: none  02: none  Covid status: vax x 3 and had covid Rec Pantoprazole (protonix) 40 mg   Take  30-60 min before first meal of the day and Pepcid (famotidine)  20 mg one after supper   GERD diet reviewed, bed blocks rec  Make sure you check your oxygen  saturation at your highest level of activity to be sure it stays over 90%          08/18/2021  f/u ov/Christian Woodard office/Christian Woodard re: uacs maint on ppi ac and pepcid hs with Fe def anemia w/u in progress  Chief Complaint  Patient presents with   Follow-up    Pt reports that the change of weather does affect his breathing  but it has improved since last visit. Pt feels that he is coughing more but has endoscopy today.   Dyspnea:  some better from mailbox to house with hgb improving  Using pedal ex x 15 min   Cough: still an issue, dry worse when lie down Sleeping: bed is slt raised, one pilow  SABA use: none  02: none  Covid status: vax x 3 and covid  Rec Increase gabapentin to where an extra pill first in evening  then add a pill weekly until cough is gone with a  maximum 300 mg three times a day and call me with the amount that you need to eliminate the cough For drainage / throat tickle try take CHLORPHENIRAMINE  4 mg  Make sure you check your oxygen saturation at your highest level of activity to be sure it stays over 90%  Echo 74/11/6382  G1 diastolic dysfunction only,nl la ra Please schedule a follow up visit in 3 months but call sooner if needed     11/22/2021  f/u ov/ office/Gianny Killman re: doe/uacs maint on gabapentin 400 mg per day  Chief Complaint  Patient presents with   Follow-up    Echo done 10-05-21  Cold weather is making breathing worse.   Would like flu shot today   Dyspnea:appears to correlate with hgb Cough: no more noct noct cough p h1 at hs Sleeping: perfectly flat bed, one pillow   SABA use: none  02: none  Covid status: vax x 3 and covid infection Gi w/u in progress for hgb 6 Sob also if bends over     No obvious day to day or daytime variability or assoc excess/ purulent sputum or mucus plugs or hemoptysis or cp or chest tightness, subjective wheeze or overt sinus or hb symptoms.   Sleeping  without nocturnal  or early am exacerbation  of respiratory   c/o's or need for noct saba. Also denies any obvious fluctuation of symptoms with weather or environmental changes or other aggravating or alleviating factors except as outlined above   No unusual exposure hx or h/o childhood pna/ asthma or knowledge of premature birth.  Current Allergies, Complete Past Medical History, Past Surgical History, Family History, and Social History were reviewed in [employer] record.  ROS  The following are not active complaints unless bolded Hoarseness, sore throat, dysphagia, dental problems, itching, sneezing,  nasal congestion or discharge of excess mucus or purulent secretions, ear ache,   fever, chills, sweats, unintended wt loss or wt gain, classically pleuritic or exertional cp,  orthopnea pnd or arm/hand swelling  or leg swelling, presyncope, palpitations, abdominal pain, anorexia, nausea, vomiting, diarrhea  or change in bowel habits or change in bladder habits, change in stools or change in urine, dysuria, hematuria,  rash, arthralgias, visual complaints, headache, numbness, weakness or ataxia or problems with walking or coordination,  change in mood or  memory.        Current Meds  Medication Sig   acetaminophen (TYLENOL) 500 MG tablet Take 1,000 mg by mouth every 6 (six) hours as needed (headaches/pain.).   aspirin EC 81 MG tablet Take 81 mg by mouth every evening. Swallow whole.   atorvastatin (LIPITOR) 10 MG tablet Take 10 mg by mouth at bedtime.   chlorpheniramine (CHLOR-TRIMETON) 4 MG tablet Take 4 mg by mouth in the morning and at bedtime.   famotidine (PEPCID) 20 MG tablet Take 20 mg by mouth daily after supper. Takes after supper   ferrous sulfate  325 (65 FE) MG tablet Take 325 mg by mouth daily with breakfast.   gabapentin (NEURONTIN) 300 MG capsule Take 1 capsule (300 mg total) by mouth 3 (three) times daily.   glipiZIDE (GLUCOTROL XL) 5 MG 24 hr tablet Take 5 mg by mouth daily with breakfast.   LANTUS SOLOSTAR 100 UNIT/ML  Solostar Pen Inject 40 Units into the skin in the morning.   losartan (COZAAR) 50 MG tablet Take 50 mg by mouth in the morning.   metFORMIN (GLUCOPHAGE) 1000 MG tablet Take 1,000 mg by mouth 2 (two) times daily.   pantoprazole (PROTONIX) 40 MG tablet TAKE 1 TABLET BY MOUTH ONCE DAILY 30  TO  60  MINUTES  BEFORE  BREAKFAST   polyethylene glycol-electrolytes (TRILYTE) 420 g solution Take 4,000 mLs by mouth as directed.   [DISCONTINUED] gabapentin (NEURONTIN) 100 MG capsule Take 1 capsule by mouth in the morning, at noon, and at bedtime.                        Objective:   Physical Exam   11/22/2021      174    08/18/2021       170  02/18/2021       175 01/19/2021       173   08/20/2017      179   06/14/17 184 lb (83.5 kg)  02/06/17 185 lb (83.9 kg)  01/24/17 184 lb (83.5 kg)    Vital signs reviewed  11/22/2021  - Note at rest 02 sats  95% on RA   General appearance:    pale amb wm / pulse up over 100 just climbing onto exam table      HEENT : pt wearing mask not removed for exam due to covid -19 concerns.    NECK :  without JVD/Nodes/TM/ nl carotid upstrokes bilaterally   LUNGS: no acc muscle use,  Nl contour chest which is clear to A and P bilaterally without cough on insp or exp maneuvers   CV:  RRR  no s3  2/6 sem s increase in P2, and no edema   ABD:  soft and nontender with nl inspiratory excursion in the supine position. No bruits or organomegaly appreciated, bowel sounds nl  MS:  Nl gait/ ext warm without deformities, calf tenderness, cyanosis or clubbing No obvious joint restrictions   SKIN: warm and dry without lesions    NEURO:  alert, approp, nl sensorium with  no motor or cerebellar deficits apparent.          Assessment:

## 2021-11-22 NOTE — Patient Instructions (Addendum)
Gabapentin try 300 mg twice daily and if not satisfied go ahead with 3 x daily   Don't push exercise any harder until you get your hemoglobin problem corrected   If you are satisfied with your treatment plan,  let your doctor know and he/she can either refill your medications or you can return here when your prescription runs out.     If in any way you are not 100% satisfied,  please tell us.  If 100% better, tell your friends!  Pulmonary follow up is as needed    Late add:  need to start LDCT in Spring of 2023 unless being coordinated by his PCP

## 2021-11-23 ENCOUNTER — Encounter: Payer: Self-pay | Admitting: Internal Medicine

## 2021-11-23 NOTE — Assessment & Plan Note (Signed)
Onset  2013  Spirometry 06/14/2017  FEV1 1.91 (70%)  Ratio 73 mild curvature  -  06/14/2017  Walked RA x 3 laps @ 185 ft each stopped due to  End of study, sats 89% and sob @ fast pace  - Allergy profile 06/14/17 >  Eos 0.1/  IgE    38 neg RAST  - PFT's  08/20/2017  FEV1 2.23 (84 % ) ratio 76  p 2 % improvement from saba p nothing prior to study with DLCO  45/55c % corrects to 78 % for alv volume   - Flared with COVID19 pna 09/2019  -  01/19/2021   Walked RA  approx   300 ft  @ fast pace  stopped due to  sats 86% min sob    - HRCT 01/21/2021 1. There is minimal irregular peripheral interstitial opacity most notable at the lung bases and involving non dependent portions of the right middle lobe and lingula. Findings suggest minimal fibrosis 2 Mild, lobular air trapping on expiratory phase imaging, suggestive of small airways disease. 3. Emphysema. 6. Coronary artery disease -  PFTs 01/31/21 no change vs priors with mild concavity  -  02/18/2021   Walked RA  approx   600 ft  @ moderate pace  stopped due to  End of study,  Min sob and sats 90% at end with hgb 12.7    - Echo 16/11/958  G1 diastolic dysfunction only,nl la ra - 08/18/2021   Walked on RA x  3  lap(s) =  approx 447ft @ mod to fast pace, stopped due to end of study with mild sob and   with lowest 02 sats 90%  Seems to correlate mostly with anemia so needs to address/ resolve this issue then start annual low dose CT scanning in the spring of 2023 (placed in reminder file)

## 2021-11-23 NOTE — Assessment & Plan Note (Signed)
F/b GI  For Barrett's since 2017  - 08/18/2021 advised to increase gabapentin to max of 300 mg tid > improved but not resolved on 100 mg qid as of 11/22/2021 so rec titrate up to 300 mg    F/u can be here if not satisfied or per PCP if doing better on higher doses of gabapentin           Each maintenance medication was reviewed in detail including emphasizing most importantly the difference between maintenance and prns and under what circumstances the prns are to be triggered using an action plan format where appropriate.  Total time for H and P, chart review, counseling,   and generating customized AVS unique to this summary final  office visit / same day charting = 30  Min

## 2021-11-25 DIAGNOSIS — E785 Hyperlipidemia, unspecified: Secondary | ICD-10-CM | POA: Diagnosis not present

## 2021-11-25 DIAGNOSIS — E1149 Type 2 diabetes mellitus with other diabetic neurological complication: Secondary | ICD-10-CM | POA: Diagnosis not present

## 2021-11-25 DIAGNOSIS — J449 Chronic obstructive pulmonary disease, unspecified: Secondary | ICD-10-CM | POA: Diagnosis not present

## 2021-11-25 DIAGNOSIS — E1129 Type 2 diabetes mellitus with other diabetic kidney complication: Secondary | ICD-10-CM | POA: Diagnosis not present

## 2021-12-25 DIAGNOSIS — E785 Hyperlipidemia, unspecified: Secondary | ICD-10-CM | POA: Diagnosis not present

## 2021-12-25 DIAGNOSIS — E1149 Type 2 diabetes mellitus with other diabetic neurological complication: Secondary | ICD-10-CM | POA: Diagnosis not present

## 2021-12-25 DIAGNOSIS — E1140 Type 2 diabetes mellitus with diabetic neuropathy, unspecified: Secondary | ICD-10-CM | POA: Diagnosis not present

## 2021-12-25 DIAGNOSIS — J449 Chronic obstructive pulmonary disease, unspecified: Secondary | ICD-10-CM | POA: Diagnosis not present

## 2021-12-25 DIAGNOSIS — Z0001 Encounter for general adult medical examination with abnormal findings: Secondary | ICD-10-CM | POA: Diagnosis not present

## 2021-12-25 DIAGNOSIS — E1129 Type 2 diabetes mellitus with other diabetic kidney complication: Secondary | ICD-10-CM | POA: Diagnosis not present

## 2022-01-09 NOTE — Progress Notes (Signed)
Primary Care Physician: Asencion Noble, MD  Primary Gastroenterologist:  Garfield Cornea, MD   Chief Complaint  Patient presents with   Anemia    F/u. Doing okay per pt    HPI: Christian Woodard is a 76 y.o. male here for follow up.  Last seen July 2022.  Patient has h/o Barrett's esophagus with low-grade dysplasia followed at [facility]. He has h/o IDA, GERD, colon polyps.   Overall he feels okay.  He states that his pulmonologist, Dr. Melvyn Novas, is concerned about his anemia.  States he sees Dr. Willey Blade next week but has blood work this week.  Last labs from November with normal hemoglobin but his ferritin had went back down into the 20 range.  Takes ferrous sulfate 325 mg daily, has been taking consistently for at least 2 years.  Patient reports no heartburn, dysphagia, vomiting, abdominal pain.  Bowel movements for the most part are regular but he may skip 2 to 3 days once a week and initial stool is hard.  Then he has regular stools for several days following.  Denies any melena or rectal bleeding.  The past he was on Benefiber with good results.  Got out of the habit of taking it over the past several months.  States he has been working hard to get his A1c down.  Over the summer it was up to 8.8 but the last time it was checked was down to 6.6.  Since he was last seen he has also completed upper endoscopy for history of Barrett's esophagus with low-grade dysplasia.  This was completed at [facility].  He will need next EGD in 1 year.  See below for findings.  Previous GI work-up:  Givens capsule 09/2017: localized clustered areas of white "blunted appearing' abnormal small bowel mucosa/villi of uncertain significance. Images were placed on a CD and sent to [facility] for evaluation.   CTE 10/2017: revealed circumferential mildly irregular wall thickening of small bowel, particularly proximal small bowel, without focal mass or other focal abnormality.   Small bowel enteroscopy in January 2019 with  Barrett's esophagus focally positive for low-grade dysplasia, normal stomach, normal duodenum biopsied, normal examined jejunum biopsied. Small bowel biopsies were benign.    EGD January 2020 with evidence of Barrett's esophagus, negative for dysplasia.    EGD March 2021 with evidence of Barrett's esophagus, indefinite for focal low-grade dysplasia.  No ablation has been pursued at this point.    Colonoscopy 09/2021: -One 7 mm polyp at the hepatic flexure, removed. Tubular adenoma. - Diverticulosis in the entire examined colon. - The entire examined colon is normal on direct and retroflexion views  -next colonoscopy in 09/2026   Procedure:             Upper GI endoscopy  Indications:           Barrett's low grade dysplasia; surveillance; on                         pantoprazole 40 mg daily, famotidine 20 mg bid  Providers:             CRAIG Charlotta Newton, MD, Greer Ee, Raynelle Fanning  Referring MD:          Casimer Leek, MD (Referring MD), Daneil Dolin,  MD (Referring MD)  Medicines:             Propofol per Anesthesia  Complications:         No immediate complications.  _______________________________________________________________________________  Procedure:             Pre-Anesthesia Assessment:                         - Prior to the procedure, a History and Physical was                         performed, and patient medications and allergies were                         reviewed. The patient's tolerance of previous                         anesthesia was also reviewed. The risks and benefits                         of the procedure and the sedation options and risks                         were discussed with the patient. All questions were                         answered, and informed consent was obtained. Prior                         Anticoagulants: The patient has taken no anticoagulant                         or  antiplatelet agents except for aspirin. ASA Grade                         Assessment: III - A patient with severe systemic                         disease. After reviewing the risks and benefits, the                         patient was deemed in satisfactory condition to                         undergo the procedure.                         After obtaining informed consent, the endoscope was                         passed under direct vision. Throughout the procedure,                         the patient's blood pressure, pulse, and oxygen                         saturations were monitored continuously. The Endoscope  was introduced through the mouth, and advanced to the                         second part of duodenum. The upper GI endoscopy was                         accomplished without difficulty. The patient tolerated                         the procedure well.                                                                                  Findings:       The esophagus and gastroesophageal junction were examined with white       light and narrow band imaging (NBI) from a forward view and retroflexed       position. There were esophageal mucosal changes consistent with       long-segment Barrett's esophagus. These changes involved the mucosa at       the upper extent of the gastric folds (37 cm from the incisors)       extending to the Z-line (33 cm from the incisors). There were two       islands of salmon-colored mucosa present at 32 cm. The maximum       longitudinal extent of these esophageal mucosal changes was 4 cm in       length. TIM: 32; MAX: 33; CIRC: 33; TGF: 34 Mucosa was biopsied with a       cold forceps for histology in 1 cm intervals given history of LGD. The       [place] at 6:00 appeared to have some differences in the pit pattern,       which was biopsied seperately. A total of 3 specimen bottles were sent       to pathology (jar 1: 35 - 37 cm;  jar 2: 33 - 35 cm; jar 3: island at 32       cm).       A 3 cm hiatal hernia was present.       The exam of the stomach was otherwise normal.       The examined duodenum was normal.                                                                                  Impression:            - Esophageal mucosal changes consistent with                         long-segment Barrett's esophagus. Biopsied.                         -  3 cm hiatal hernia.                         - Normal examined duodenum.  Recommendation:        - Patient has a contact number available for                         emergencies. The signs and symptoms of potential                         delayed complications were discussed with the patient.                         Return to normal activities tomorrow. Written                         discharge instructions were provided to the patient.                         - Continue present medications.                         - Resume previous diet.                         - Await pathology results.                         - Repeat upper endoscopy in 1 year for surveillance or                         sooner as needed pending pathology results.                                                                                  Procedure Code(s):     --- Professional ---                         [phone], Esophagogastroduodenoscopy, flexible,                          transoral; with biopsy, single or multiple  Diagnosis Code(s):     --- Professional ---                         K44.9, Diaphragmatic hernia without obstruction or                         gangrene                         K22.710, Barrett's esophagus with low grade dysplasia   CPT copyright 2021 American Medical Association. All rights reserved.   The codes documented in this report are preliminary and upon coder review may  be revised to meet current compliance requirements.  Electronically Signed By Michiel Cowboy, MD    Diagnosis    A: Esophagus, 35-37, biopsy - Gastroesophageal junctional mucosa with goblet cell metaplasia, consistent with Barrett esophagus - No evidence of dysplasia   B: Esophagus, 33-35, biopsy - Gastroesophageal junctional mucosa with goblet cell metaplasia, consistent with Barrett esophagus - No evidence of dysplasia; tissue inflamed   C: Esophagus, 32, biopsy - Gastroesophageal junctional mucosa with goblet cell metaplasia, consistent with Barrett esophagus - No evidence of dysplasia    This electronic signature is attestation that the pathologist personally reviewed the submitted material(s) and the final diagnosis reflects that evaluation.      Current Outpatient Medications  Medication Sig Dispense Refill   acetaminophen (TYLENOL) 500 MG tablet Take 1,000 mg by mouth every 6 (six) hours as needed (headaches/pain.).     aspirin EC 81 MG tablet Take 81 mg by mouth every evening. Swallow whole.     atorvastatin (LIPITOR) 10 MG tablet Take 10 mg by mouth at bedtime.     chlorpheniramine (CHLOR-TRIMETON) 4 MG tablet Take 4 mg by mouth in the morning and at bedtime.     famotidine (PEPCID) 20 MG tablet Take 20 mg by mouth daily after supper. Takes after supper     ferrous sulfate 325 (65 FE) MG tablet Take 325 mg by mouth daily with breakfast.     gabapentin (NEURONTIN) 300 MG capsule Take 1 capsule (300 mg total) by mouth 3 (three) times daily. (Patient taking differently: Take 300 mg by mouth 2 (two) times daily.) 90 capsule 2   glipiZIDE (GLUCOTROL XL) 5 MG 24 hr tablet Take 5 mg by mouth daily with breakfast.     LANTUS SOLOSTAR 100 UNIT/ML Solostar Pen Inject 40 Units into the skin in the morning.     losartan (COZAAR) 50 MG tablet Take 50 mg by mouth in the morning.     metFORMIN (GLUCOPHAGE) 1000 MG tablet Take 1,000 mg by mouth 2 (two) times daily.     pantoprazole (PROTONIX) 40 MG tablet TAKE 1 TABLET BY MOUTH ONCE DAILY 30  TO  60  MINUTES  BEFORE  BREAKFAST 90 tablet  3   No current facility-administered medications for this visit.    Allergies as of 01/10/2022   (No Known Allergies)    ROS:  General: Negative for anorexia, weight loss, fever, chills, fatigue, weakness. ENT: Negative for hoarseness, difficulty swallowing , nasal congestion. CV: Negative for chest pain, angina, palpitations, +dyspnea on exertion, peripheral edema.  Respiratory: Negative for dyspnea at rest, +dyspnea on exertion, cough, sputum, wheezing.  GI: See history of present illness. GU:  Negative for dysuria, hematuria, urinary incontinence, urinary frequency, nocturnal urination.  Endo: Negative for unusual weight change.    Physical Examination:   BP 130/75    Pulse (!) 101    Temp (!) 97 F (36.1 C)    Ht 5\' 6"  (1.676 m)    Wt 171 lb 9.6 oz (77.8 kg)    BMI 27.70 kg/m   General: pale chronically ill appearing male in no acute distress.  Eyes: No icterus. Mouth: masked  Abdomen: Bowel sounds are normal, nontender, nondistended, no hepatosplenomegaly or masses, no abdominal bruits or hernia , no rebound or guarding.  +rectus diastasis Extremities: No lower extremity edema. No clubbing or deformities. Neuro: Alert and oriented x 4   Skin: Warm and dry, no jaundice.   Psych: Alert and cooperative, normal mood and affect.  Labs:  Lab Results  Component Value Date  CREATININE 1.08 09/27/2021   BUN 18 09/27/2021   NA 138 09/27/2021   K 3.8 09/27/2021   CL 104 09/27/2021   CO2 24 09/27/2021   Lab Results  Component Value Date   ALT 23 04/11/2021   AST 21 04/11/2021   ALKPHOS 58 10/07/2019   BILITOT 1.2 04/11/2021   Lab Results  Component Value Date   WBC 7.6 09/27/2021   HGB 14.1 09/27/2021   HCT 42.7 09/27/2021   MCV 93.2 09/27/2021   PLT 297 09/27/2021   Lab Results  Component Value Date   IRON 96 09/27/2021   TIBC 323 09/27/2021   FERRITIN 20 (L) 09/27/2021     Imaging Studies: No results found.   Assessment:  IDA: Extensive evaluation  as outlined above.  EGD and colonoscopy within the past year.  Celiac serologies previously negative.  Labs in November with normal hemoglobin but ferritin started declining into the abnormal range.  Has been on chronic daily oral iron.  No overt GI bleeding.  Need to update labs.  GERD: Typical symptoms well controlled.  Barrett's esophagus with history of dysplasia: Followed at [facility], last EGD in September 2022.  So far has been undergoing surveillance endoscopies, has not required ablation therapy.  Rectus diastases: Noted on exam.  No obvious herniation.  Constipation: Mild, typically has regular bowel movements several times per week but then may skip 2 to 3 days and passed a hard stool.  Colonoscopy up-to-date.  Cryptorchidism, unilateral (right): Noted at time of CT in 2018.  No recommendations per [facility].  Per up-to-date guidelines, no recommendations for surgical intervention given patient's age.  Plan: CBC, iron/tibc/ferritin. Continue ferrous sulfate 325 mg daily. Continue pantoprazole 40 mg daily before breakfast. Continue Pepcid 20 mg at supper. Restart Benefiber 2 teaspoons 1-2 times daily to maintain regular soft bowel movements. Continue to follow with [facility] for surveillance upper endoscopies.

## 2022-01-10 ENCOUNTER — Ambulatory Visit: Payer: Medicare HMO | Admitting: Gastroenterology

## 2022-01-10 ENCOUNTER — Other Ambulatory Visit: Payer: Self-pay

## 2022-01-10 ENCOUNTER — Encounter: Payer: Self-pay | Admitting: Gastroenterology

## 2022-01-10 VITALS — BP 130/75 | HR 101 | Temp 97.0°F | Ht 66.0 in | Wt 171.6 lb

## 2022-01-10 DIAGNOSIS — K219 Gastro-esophageal reflux disease without esophagitis: Secondary | ICD-10-CM | POA: Diagnosis not present

## 2022-01-10 DIAGNOSIS — K5900 Constipation, unspecified: Secondary | ICD-10-CM

## 2022-01-10 DIAGNOSIS — D509 Iron deficiency anemia, unspecified: Secondary | ICD-10-CM

## 2022-01-10 DIAGNOSIS — M6208 Separation of muscle (nontraumatic), other site: Secondary | ICD-10-CM | POA: Diagnosis not present

## 2022-01-10 NOTE — Patient Instructions (Addendum)
Please take lab orders to [facility] when you go to get blood work this week for Dr. Willey Blade.  Make sure you tell the lab that you have orders for both of [place] and Dr. Willey Blade. Continue iron once daily. Continue pantoprazole 40 mg before breakfast.  Continue Pepcid 20 mg after supper. Add Benefiber 2 teaspoons 1-2 times daily to maintain regular soft bowel movements.  If you continue to have issues with hard stools after adding fiber, you can try stool softener (docusate sodium) 100 mg at bedtime or MiraLAX 17 g once daily as needed. Continue to follow with [facility] for surveillance upper endoscopies.    Diastasis Recti Diastasis recti is a condition in which the muscles of the abdomen (rectus abdominis muscles) become thin and separate. The result is a wider space between the muscles of the right and left abdomen (abdominal muscles). This wider space between the muscles may cause a bulge in the middle of the abdomen. This bulge may be noticed when a person is straining or when he or she sits up after lying down. Diastasis recti can affect men and women. It is most common among pregnant women, babies, people with obesity, and people who have had abdominal surgery. Exercise or surgery may help correct this condition. What are the causes? Common causes of this condition include: Pregnancy. As the uterus grows in size, it puts pressure on the abdominal muscles, causing the muscles to separate. Obesity. Excess fat puts pressure on abdominal muscles. Weight lifting. Some exercises of the abdomen. Advanced age. Genetics. Having had surgery on the abdomen before. What increases the risk? This condition is more likely to develop in: Women. Newborns, especially newborns who are born early (prematurely). What are the signs or symptoms? Common symptoms of this condition include: A bulge in the middle of your abdomen. You will notice it most when you sit up or strain. Pain in your low back, hips, or the area between  your hip bones (pelvis). Constipation. Being unable to control when you urinate (urinary incontinence). Bloating. Poor posture. How is this diagnosed? This condition is diagnosed with a physical exam. During the exam, your health care provider will ask you to lie flat on your back and do a crunch or half sit-up. If you have diastasis recti, a bulge will appear lengthwise between your abdominal muscles in the center of your abdomen. Your health care provider will measure the gap between your muscles with one of the following: A medical device used to measure the space between two objects (caliper). A tape measure. CT scan. Ultrasound. Finger spaces. Your health care provider will measure the space using his or her fingers. How is this treated? If your muscle separation is not too large, you may not need treatment. However, if you are a woman who plans to become pregnant again, you should treat this condition before your next pregnancy. Treatment may include: Physical therapy exercises to strengthen and tighten your abdominal muscles. Lifestyle changes such as weight loss and exercise. Over-the-counter pain medicines as needed. Surgery to correct the separation. Follow these instructions at home: Activity Return to your normal activities as told by your health care provider. Ask your health care provider what activities are safe for you. Do exercises as told by your health care provider. Make sure you are doing your exercises and movements correctly when lifting weights or doing exercises using your abdominal muscles or the muscles in the center of your body that give stability (core muscles). Proper form can help  to prevent this condition from happening again. General instructions If you are overweight, ask your health care provider for help with weight loss. Losing even a small amount of weight can help to improve your diastasis recti. Take over-the-counter or prescription medicines only as  told by your health care provider. Do not strain. Straining can make the separation worse. Examples of straining include: Pushing hard to have a bowel movement, such as when you have constipation. Lifting heavy objects or lifting children. Standing up and sitting down. You may need to take these actions to prevent or treat constipation: Drink enough fluid to keep your urine pale yellow. Take over-the-counter or prescription medicines. Eat foods that are high in fiber, such as beans, whole grains, and fresh fruits and vegetables. Limit foods that are high in fat and processed sugars, such as fried or sweet foods. Keep all follow-up visits. This is important. Contact a health care provider if: You notice a new bulge in your abdomen. Get help right away if: You experience severe discomfort in your abdomen. You develop severe abdominal pain along with nausea, vomiting, or a fever. Summary Diastasis recti is a condition in which the muscles of the abdomen (rectus abdominismuscles) become thin and separate. You may notice a bulge in your abdomen because the space has widened between the muscles of the right and left abdomen. The most common symptom is a bulge in the middle of your abdomen. You will notice it most when you sit up or strain. This condition is diagnosed with a physical exam. If the muscle separation is not too big, you may not need treatment. Otherwise, you may need to do physical therapy or have surgery. This information is not intended to replace advice given to you by your health care provider. Make sure you discuss any questions you have with your health care provider. Document Revised: 07/16/2020 Document Reviewed: 07/16/2020 Elsevier Patient Education  [place].

## 2022-01-12 DIAGNOSIS — K5900 Constipation, unspecified: Secondary | ICD-10-CM | POA: Diagnosis not present

## 2022-01-12 DIAGNOSIS — D509 Iron deficiency anemia, unspecified: Secondary | ICD-10-CM | POA: Diagnosis not present

## 2022-01-12 DIAGNOSIS — K219 Gastro-esophageal reflux disease without esophagitis: Secondary | ICD-10-CM | POA: Diagnosis not present

## 2022-01-12 DIAGNOSIS — M6208 Separation of muscle (nontraumatic), other site: Secondary | ICD-10-CM | POA: Diagnosis not present

## 2022-01-12 DIAGNOSIS — E1129 Type 2 diabetes mellitus with other diabetic kidney complication: Secondary | ICD-10-CM | POA: Diagnosis not present

## 2022-01-13 LAB — CBC WITH DIFFERENTIAL/PLATELET
Basophils Absolute: 0.1 10*3/uL (ref 0.0–0.2)
Basos: 1 %
EOS (ABSOLUTE): 0.2 10*3/uL (ref 0.0–0.4)
Eos: 2 %
Hematocrit: 44.5 % (ref 37.5–51.0)
Hemoglobin: 15.3 g/dL (ref 13.0–17.7)
Immature Grans (Abs): 0 10*3/uL (ref 0.0–0.1)
Immature Granulocytes: 0 %
Lymphocytes Absolute: 1 10*3/uL (ref 0.7–3.1)
Lymphs: 9 %
MCH: 30.2 pg (ref 26.6–33.0)
MCHC: 34.4 g/dL (ref 31.5–35.7)
MCV: 88 fL (ref 79–97)
Monocytes Absolute: 1.2 10*3/uL — ABNORMAL HIGH (ref 0.1–0.9)
Monocytes: 11 %
Neutrophils Absolute: 8 10*3/uL — ABNORMAL HIGH (ref 1.4–7.0)
Neutrophils: 77 %
Platelets: 377 10*3/uL (ref 150–450)
RBC: 5.07 x10E6/uL (ref 4.14–5.80)
RDW: 13.5 % (ref 11.6–15.4)
WBC: 10.5 10*3/uL (ref 3.4–10.8)

## 2022-01-13 LAB — IRON,TIBC AND FERRITIN PANEL
Ferritin: 56 ng/mL (ref 30–400)
Iron Saturation: 43 % (ref 15–55)
Iron: 124 ug/dL (ref 38–169)
Total Iron Binding Capacity: 290 ug/dL (ref 250–450)
UIBC: 166 ug/dL (ref 111–343)

## 2022-01-19 DIAGNOSIS — J449 Chronic obstructive pulmonary disease, unspecified: Secondary | ICD-10-CM | POA: Diagnosis not present

## 2022-01-19 DIAGNOSIS — D509 Iron deficiency anemia, unspecified: Secondary | ICD-10-CM | POA: Diagnosis not present

## 2022-01-19 DIAGNOSIS — E1149 Type 2 diabetes mellitus with other diabetic neurological complication: Secondary | ICD-10-CM | POA: Diagnosis not present

## 2022-01-21 LAB — HEMOGLOBIN A1C
Est. average glucose Bld gHb Est-mCnc: 180 mg/dL
Hgb A1c MFr Bld: 7.9 % — ABNORMAL HIGH (ref 4.8–5.6)

## 2022-01-21 LAB — SPECIMEN STATUS REPORT

## 2022-01-24 DIAGNOSIS — E1149 Type 2 diabetes mellitus with other diabetic neurological complication: Secondary | ICD-10-CM | POA: Diagnosis not present

## 2022-01-24 DIAGNOSIS — J449 Chronic obstructive pulmonary disease, unspecified: Secondary | ICD-10-CM | POA: Diagnosis not present

## 2022-01-24 DIAGNOSIS — E1129 Type 2 diabetes mellitus with other diabetic kidney complication: Secondary | ICD-10-CM | POA: Diagnosis not present

## 2022-01-24 DIAGNOSIS — E785 Hyperlipidemia, unspecified: Secondary | ICD-10-CM | POA: Diagnosis not present

## 2022-02-21 DIAGNOSIS — Z947 Corneal transplant status: Secondary | ICD-10-CM | POA: Diagnosis not present

## 2022-02-21 DIAGNOSIS — H18601 Keratoconus, unspecified, right eye: Secondary | ICD-10-CM | POA: Diagnosis not present

## 2022-02-21 DIAGNOSIS — H18602 Keratoconus, unspecified, left eye: Secondary | ICD-10-CM | POA: Diagnosis not present

## 2022-02-24 DIAGNOSIS — E785 Hyperlipidemia, unspecified: Secondary | ICD-10-CM | POA: Diagnosis not present

## 2022-02-24 DIAGNOSIS — J449 Chronic obstructive pulmonary disease, unspecified: Secondary | ICD-10-CM | POA: Diagnosis not present

## 2022-02-24 DIAGNOSIS — E1129 Type 2 diabetes mellitus with other diabetic kidney complication: Secondary | ICD-10-CM | POA: Diagnosis not present

## 2022-02-24 DIAGNOSIS — E1149 Type 2 diabetes mellitus with other diabetic neurological complication: Secondary | ICD-10-CM | POA: Diagnosis not present

## 2022-03-22 ENCOUNTER — Telehealth: Payer: Self-pay

## 2022-03-22 DIAGNOSIS — Z87891 Personal history of nicotine dependence: Secondary | ICD-10-CM

## 2022-03-22 NOTE — Telephone Encounter (Signed)
-----   Message from Tanda Rockers, MD sent at 11/23/2021  7:53 AM EST ----- ?Needs refer to Eric Form if not doing lung cancer screening thru PCP  ? ?

## 2022-03-22 NOTE — Telephone Encounter (Signed)
ATC patient. LMTCB ?Sent a [employer] as well asking patient if he currently does any lung screening.  ?

## 2022-03-24 DIAGNOSIS — H40013 Open angle with borderline findings, low risk, bilateral: Secondary | ICD-10-CM | POA: Diagnosis not present

## 2022-03-24 DIAGNOSIS — H26492 Other secondary cataract, left eye: Secondary | ICD-10-CM | POA: Diagnosis not present

## 2022-03-24 DIAGNOSIS — H35373 Puckering of macula, bilateral: Secondary | ICD-10-CM | POA: Diagnosis not present

## 2022-03-24 DIAGNOSIS — E119 Type 2 diabetes mellitus without complications: Secondary | ICD-10-CM | POA: Diagnosis not present

## 2022-03-26 DIAGNOSIS — E1149 Type 2 diabetes mellitus with other diabetic neurological complication: Secondary | ICD-10-CM | POA: Diagnosis not present

## 2022-03-26 DIAGNOSIS — E785 Hyperlipidemia, unspecified: Secondary | ICD-10-CM | POA: Diagnosis not present

## 2022-03-26 DIAGNOSIS — J449 Chronic obstructive pulmonary disease, unspecified: Secondary | ICD-10-CM | POA: Diagnosis not present

## 2022-03-26 DIAGNOSIS — E1129 Type 2 diabetes mellitus with other diabetic kidney complication: Secondary | ICD-10-CM | POA: Diagnosis not present

## 2022-03-27 DIAGNOSIS — H26492 Other secondary cataract, left eye: Secondary | ICD-10-CM | POA: Diagnosis not present

## 2022-04-13 DIAGNOSIS — E1129 Type 2 diabetes mellitus with other diabetic kidney complication: Secondary | ICD-10-CM | POA: Diagnosis not present

## 2022-04-13 DIAGNOSIS — Z79899 Other long term (current) drug therapy: Secondary | ICD-10-CM | POA: Diagnosis not present

## 2022-04-13 DIAGNOSIS — D509 Iron deficiency anemia, unspecified: Secondary | ICD-10-CM | POA: Diagnosis not present

## 2022-04-13 DIAGNOSIS — E785 Hyperlipidemia, unspecified: Secondary | ICD-10-CM | POA: Diagnosis not present

## 2022-04-20 DIAGNOSIS — R7309 Other abnormal glucose: Secondary | ICD-10-CM | POA: Diagnosis not present

## 2022-04-20 DIAGNOSIS — I700 Atherosclerosis of aorta: Secondary | ICD-10-CM | POA: Diagnosis not present

## 2022-04-20 DIAGNOSIS — E1122 Type 2 diabetes mellitus with diabetic chronic kidney disease: Secondary | ICD-10-CM | POA: Diagnosis not present

## 2022-04-20 DIAGNOSIS — E785 Hyperlipidemia, unspecified: Secondary | ICD-10-CM | POA: Diagnosis not present

## 2022-04-26 DIAGNOSIS — E1149 Type 2 diabetes mellitus with other diabetic neurological complication: Secondary | ICD-10-CM | POA: Diagnosis not present

## 2022-04-26 DIAGNOSIS — E785 Hyperlipidemia, unspecified: Secondary | ICD-10-CM | POA: Diagnosis not present

## 2022-04-26 DIAGNOSIS — J449 Chronic obstructive pulmonary disease, unspecified: Secondary | ICD-10-CM | POA: Diagnosis not present

## 2022-04-26 DIAGNOSIS — E1129 Type 2 diabetes mellitus with other diabetic kidney complication: Secondary | ICD-10-CM | POA: Diagnosis not present

## 2022-05-03 ENCOUNTER — Ambulatory Visit: Payer: Medicare HMO | Admitting: Podiatry

## 2022-05-04 ENCOUNTER — Ambulatory Visit: Payer: Medicare HMO | Admitting: Podiatry

## 2022-05-04 ENCOUNTER — Encounter: Payer: Self-pay | Admitting: Podiatry

## 2022-05-04 DIAGNOSIS — R2681 Unsteadiness on feet: Secondary | ICD-10-CM | POA: Diagnosis not present

## 2022-05-04 DIAGNOSIS — G629 Polyneuropathy, unspecified: Secondary | ICD-10-CM | POA: Diagnosis not present

## 2022-05-04 DIAGNOSIS — M779 Enthesopathy, unspecified: Secondary | ICD-10-CM

## 2022-05-04 NOTE — Progress Notes (Signed)
Subjective:   Patient ID: Christian Woodard, male   DOB: 76 y.o.   MRN: [id number]   HPI Patient presents stating he does have neuropathy with diminished sensation and gets shooting pains in his toes and is concerned about whether there could be anything else going on and also feels like his gait is unsteady with possibility for falls.  Patient does not smoke would like to be active   Review of Systems  All other systems reviewed and are negative.       Objective:  Physical Exam Vitals and nursing note reviewed.  Constitutional:      Appearance: He is well-developed.  Pulmonary:     Effort: Pulmonary effort is normal.  Musculoskeletal:        General: Normal range of motion.  Skin:    General: Skin is warm.  Neurological:     Mental Status: He is alert.     Vascular status was found to be mildly diminished but intact bilateral with no history of claudication with patient noted to have diminishment of sharp dull vibratory bilateral.  I checked I did not note any positive neuroma symptomatology only minimal capsulitis like inflammation and no swelling about the lesser MPJs.  History again of gait instability and he does feel comfortable with his gait and also is noted to have some nail disease bilateral.  Patient is taking gabapentin which he states helps him with the discomfort he experiences     Assessment:  Probability for neuropathy which may be idiopathic or possibly related to the back with mild capsulitis-like symptoms gait instability and mycotic nail infection      Plan:  H&P condition reviewed and I do recommend that he try to walk on a daily basis with his wife next to him in case he gets unsteady and that he continue the gabapentin we discussed dosage he could increase it slightly but he has had more issue when he tries to increase it so I rather he hold off on that but it may have to be considered at 1 point in future.  Patient overall is relatively stable I do not  recommend further treatment and I did not see any area to go after from an inflammatory standpoint

## 2022-05-10 ENCOUNTER — Telehealth: Payer: Self-pay | Admitting: Acute Care

## 2022-05-10 ENCOUNTER — Other Ambulatory Visit: Payer: Self-pay

## 2022-05-10 DIAGNOSIS — R911 Solitary pulmonary nodule: Secondary | ICD-10-CM

## 2022-05-10 NOTE — Progress Notes (Signed)
Error. No orders

## 2022-05-10 NOTE — Telephone Encounter (Signed)
Spoke with patient regarding [facility] referral.  Patient quit smoking in year 2000 which makes him ineligible for [facility] LDCT.  Over 15 years as non-smoker.  Patient is interested in a LDCT.  Will copy Dr. Melvyn Novas on message to review and contact patient to schedule CT Chest if recommended.  Patient acknowledged  understanding.  Referral to [facility] closed.

## 2022-05-11 ENCOUNTER — Encounter: Payer: Self-pay | Admitting: Internal Medicine

## 2022-05-11 DIAGNOSIS — R911 Solitary pulmonary nodule: Secondary | ICD-10-CM | POA: Insufficient documentation

## 2022-05-11 NOTE — Telephone Encounter (Signed)
Looks like I assumed he would be followed in Lung cancer screening program but did not meet criteria so due now > next available

## 2022-05-11 NOTE — Telephone Encounter (Signed)
Dr. Melvyn Novas when would you like the CT done?

## 2022-05-11 NOTE — Telephone Encounter (Signed)
He needs a non contrast CT chest f/u 7 mm nodule

## 2022-05-11 NOTE — Addendum Note (Signed)
Addended by: Fritzi Mandes D on: 05/11/2022 04:36 PM   Modules accepted: Orders

## 2022-05-11 NOTE — Telephone Encounter (Signed)
Order placed. Nothing further needed. 

## 2022-05-22 ENCOUNTER — Other Ambulatory Visit: Payer: Self-pay | Admitting: Gastroenterology

## 2022-05-25 ENCOUNTER — Ambulatory Visit (HOSPITAL_COMMUNITY)
Admission: RE | Admit: 2022-05-25 | Discharge: 2022-05-25 | Disposition: A | Discharge status: Home / Self Care | Payer: Medicare HMO | Source: Ambulatory Visit | Attending: Internal Medicine | Admitting: Internal Medicine

## 2022-05-25 DIAGNOSIS — J439 Emphysema, unspecified: Secondary | ICD-10-CM | POA: Diagnosis not present

## 2022-05-25 DIAGNOSIS — R911 Solitary pulmonary nodule: Secondary | ICD-10-CM | POA: Insufficient documentation

## 2022-05-25 DIAGNOSIS — J8410 Pulmonary fibrosis, unspecified: Secondary | ICD-10-CM | POA: Diagnosis not present

## 2022-05-26 ENCOUNTER — Telehealth: Payer: Self-pay | Admitting: Internal Medicine

## 2022-05-26 DIAGNOSIS — E1149 Type 2 diabetes mellitus with other diabetic neurological complication: Secondary | ICD-10-CM | POA: Diagnosis not present

## 2022-05-26 DIAGNOSIS — E1129 Type 2 diabetes mellitus with other diabetic kidney complication: Secondary | ICD-10-CM | POA: Diagnosis not present

## 2022-05-26 DIAGNOSIS — J449 Chronic obstructive pulmonary disease, unspecified: Secondary | ICD-10-CM | POA: Diagnosis not present

## 2022-05-26 DIAGNOSIS — E785 Hyperlipidemia, unspecified: Secondary | ICD-10-CM | POA: Diagnosis not present

## 2022-05-26 NOTE — Telephone Encounter (Signed)
Called patient back to inform him of his CT results from Dr Melvyn Novas. Patient verbalized understanding. Nothing further needed

## 2022-05-26 NOTE — Progress Notes (Signed)
Tried calling the pt and there was no answer- LMTCB.  

## 2022-06-14 ENCOUNTER — Encounter: Payer: Medicare HMO | Admitting: Acute Care

## 2022-06-15 ENCOUNTER — Ambulatory Visit (HOSPITAL_COMMUNITY): Payer: Medicare HMO

## 2022-06-15 ENCOUNTER — Encounter: Payer: Self-pay | Admitting: Internal Medicine

## 2022-06-26 DIAGNOSIS — J449 Chronic obstructive pulmonary disease, unspecified: Secondary | ICD-10-CM | POA: Diagnosis not present

## 2022-06-26 DIAGNOSIS — E1149 Type 2 diabetes mellitus with other diabetic neurological complication: Secondary | ICD-10-CM | POA: Diagnosis not present

## 2022-06-26 DIAGNOSIS — E1129 Type 2 diabetes mellitus with other diabetic kidney complication: Secondary | ICD-10-CM | POA: Diagnosis not present

## 2022-06-26 DIAGNOSIS — E785 Hyperlipidemia, unspecified: Secondary | ICD-10-CM | POA: Diagnosis not present

## 2022-07-18 DIAGNOSIS — E1129 Type 2 diabetes mellitus with other diabetic kidney complication: Secondary | ICD-10-CM | POA: Diagnosis not present

## 2022-07-24 DIAGNOSIS — R739 Hyperglycemia, unspecified: Secondary | ICD-10-CM | POA: Diagnosis not present

## 2022-07-24 DIAGNOSIS — I10 Essential (primary) hypertension: Secondary | ICD-10-CM | POA: Diagnosis not present

## 2022-07-24 DIAGNOSIS — G609 Hereditary and idiopathic neuropathy, unspecified: Secondary | ICD-10-CM | POA: Diagnosis not present

## 2022-07-24 DIAGNOSIS — E1169 Type 2 diabetes mellitus with other specified complication: Secondary | ICD-10-CM | POA: Diagnosis not present

## 2022-07-27 DIAGNOSIS — E1129 Type 2 diabetes mellitus with other diabetic kidney complication: Secondary | ICD-10-CM | POA: Diagnosis not present

## 2022-07-27 DIAGNOSIS — E785 Hyperlipidemia, unspecified: Secondary | ICD-10-CM | POA: Diagnosis not present

## 2022-07-27 DIAGNOSIS — E1169 Type 2 diabetes mellitus with other specified complication: Secondary | ICD-10-CM | POA: Diagnosis not present

## 2022-07-27 DIAGNOSIS — J449 Chronic obstructive pulmonary disease, unspecified: Secondary | ICD-10-CM | POA: Diagnosis not present

## 2022-08-16 ENCOUNTER — Ambulatory Visit: Payer: Medicare HMO | Admitting: Gastroenterology

## 2022-08-16 ENCOUNTER — Encounter: Payer: Self-pay | Admitting: Gastroenterology

## 2022-08-16 VITALS — BP 128/74 | HR 99 | Temp 97.1°F | Ht 66.0 in | Wt 173.4 lb

## 2022-08-16 DIAGNOSIS — K5900 Constipation, unspecified: Secondary | ICD-10-CM | POA: Diagnosis not present

## 2022-08-16 DIAGNOSIS — D509 Iron deficiency anemia, unspecified: Secondary | ICD-10-CM

## 2022-08-16 DIAGNOSIS — K2100 Gastro-esophageal reflux disease with esophagitis, without bleeding: Secondary | ICD-10-CM | POA: Diagnosis not present

## 2022-08-16 DIAGNOSIS — K22710 Barrett's esophagus with low grade dysplasia: Secondary | ICD-10-CM

## 2022-08-16 MED ORDER — PANTOPRAZOLE SODIUM 40 MG PO TBEC: DELAYED_RELEASE_TABLET | ORAL | 3 refills | Status: DC

## 2022-08-16 NOTE — Progress Notes (Signed)
GI Office Note    Referring Provider: Asencion Noble, MD Primary Care Physician:  Asencion Noble, MD  Primary Gastroenterologist: Garfield Cornea, MD   Chief Complaint   Chief Complaint  Patient presents with   Follow-up    Doing well, no acute issues.     History of Present Illness   Christian Woodard is a 76 y.o. male presenting today for follow-up.  Last seen in February 2023.  Patient has history of Barrett's esophagus with low-grade dysplasia followed at [facility], IDA, GERD, colon polyps.  Labs from May 2023: Blood cell count 11.7, hemoglobin 10.5, hematocrit 45.1, platelets 385,000, creatinine 1.14, bilirubin 1.3, alkaline phosphatase 101, AST 27, ALT 25, albumin 4.2, A1c 8.4.  Labs from February 2023: Serum iron 124, TIBC 290, iron saturations 43%, ferritin 56 (09/2021 was 20).  Hemoglobin 15.3.  Patient states he has not heard from [facility] about scheduling his yearly EGD. He plans to contact them to find out. He has been off iron since 06/2022 per Dr. Willey Blade. He denies abdominal pain. BM regular. No melena, brbpr. No dysphagia. No unintentional weight loss. Appetite is good.     Previous GI work-up:   Givens capsule 09/2017: localized clustered areas of white "blunted appearing' abnormal small bowel mucosa/villi of uncertain significance. Images were placed on a CD and sent to [facility] for evaluation.    CTE 10/2017: revealed circumferential mildly irregular wall thickening of small bowel, particularly proximal small bowel, without focal mass or other focal abnormality.    Small bowel enteroscopy in January 2019 with Barrett's esophagus focally positive for low-grade dysplasia, normal stomach, normal duodenum biopsied, normal examined jejunum biopsied. Small bowel biopsies were benign.     EGD January 2020 with evidence of Barrett's esophagus, negative for dysplasia.     EGD March 2021 with evidence of Barrett's esophagus, indefinite for focal low-grade dysplasia.  No ablation has been  pursued at this point.    Colonoscopy 09/2021: -One 7 mm polyp at the hepatic flexure, removed. Tubular adenoma. - Diverticulosis in the entire examined colon. - The entire examined colon is normal on direct and retroflexion views  -next colonoscopy in 09/2026    Procedure:             Upper GI endoscopy  08/18/21 Indications:           Barrett's low grade dysplasia; surveillance; on                         pantoprazole 40 mg daily, famotidine 20 mg bid  Providers:             CRAIG Charlotta Newton, MD, Percival Spanish  Referring MD:          Casimer Leek, MD (Referring MD), Daneil Dolin,                         MD (Referring MD)  Medicines:             Propofol per Anesthesia  Complications:         No immediate complications.  _______________________________________________________________________________  Procedure:             Pre-Anesthesia Assessment:                         -  Prior to the procedure, a History and Physical was                         performed, and patient medications and allergies were                         reviewed. The patient's tolerance of previous                         anesthesia was also reviewed. The risks and benefits                         of the procedure and the sedation options and risks                         were discussed with the patient. All questions were                         answered, and informed consent was obtained. Prior                         Anticoagulants: The patient has taken no anticoagulant                         or antiplatelet agents except for aspirin. ASA Grade                         Assessment: III - A patient with severe systemic                         disease. After reviewing the risks and benefits, the                         patient was deemed in satisfactory condition to                         undergo the procedure.                         After obtaining  informed consent, the endoscope was                         passed under direct vision. Throughout the procedure,                         the patient's blood pressure, pulse, and oxygen                         saturations were monitored continuously. The Endoscope                         was introduced through the mouth, and advanced to the                         second part of duodenum. The upper GI endoscopy was                         accomplished without difficulty.  The patient tolerated                         the procedure well.                                                                                  Findings:       The esophagus and gastroesophageal junction were examined with white       light and narrow band imaging (NBI) from a forward view and retroflexed       position. There were esophageal mucosal changes consistent with       long-segment Barrett's esophagus. These changes involved the mucosa at       the upper extent of the gastric folds (37 cm from the incisors)       extending to the Z-line (33 cm from the incisors). There were two       islands of salmon-colored mucosa present at 32 cm. The maximum       longitudinal extent of these esophageal mucosal changes was 4 cm in       length. TIM: 32; MAX: 33; CIRC: 33; TGF: 34 Mucosa was biopsied with a       cold forceps for histology in 1 cm intervals given history of LGD. The       [place] at 6:00 appeared to have some differences in the pit pattern,       which was biopsied seperately. A total of 3 specimen bottles were sent       to pathology (jar 1: 35 - 37 cm; jar 2: 33 - 35 cm; jar 3: island at 32       cm).       A 3 cm hiatal hernia was present.       The exam of the stomach was otherwise normal.       The examined duodenum was normal.                                                                                  Impression:            - Esophageal mucosal changes consistent with                          long-segment Barrett's esophagus. Biopsied.                         - 3 cm hiatal hernia.                         - Normal examined duodenum.  Recommendation:        - Patient has a contact number available for  emergencies. The signs and symptoms of potential                         delayed complications were discussed with the patient.                         Return to normal activities tomorrow. Written                         discharge instructions were provided to the patient.                         - Continue present medications.                         - Resume previous diet.                         - Await pathology results.                         - Repeat upper endoscopy in 1 year for surveillance or                         sooner as needed pending pathology results.                                                                                  Procedure Code(s):     --- Professional ---                         [id number], Esophagogastroduodenoscopy, flexible,                          transoral; with biopsy, single or multiple  Diagnosis Code(s):     --- Professional ---                         K44.9, Diaphragmatic hernia without obstruction or                         gangrene                         K22.710, Barrett's esophagus with low grade dysplasia   CPT copyright 2021 American Medical Association. All rights reserved.   The codes documented in this report are preliminary and upon coder review may  be revised to meet current compliance requirements.   Electronically Signed By Michiel Cowboy, MD    Diagnosis     A: Esophagus, 35-37, biopsy - Gastroesophageal junctional mucosa with goblet cell metaplasia, consistent with Barrett esophagus - No evidence of dysplasia   B: Esophagus, 33-35, biopsy - Gastroesophageal junctional mucosa with goblet cell metaplasia, consistent with Barrett esophagus - No evidence of dysplasia; tissue inflamed   C:  Esophagus, 32, biopsy - Gastroesophageal junctional mucosa with goblet  cell metaplasia, consistent with Barrett esophagus - No evidence of dysplasia    This electronic signature is attestation that the pathologist personally reviewed the submitted material(s) and the final diagnosis reflects that evaluation.             Medications   Current Outpatient Medications  Medication Sig Dispense Refill   acetaminophen (TYLENOL) 500 MG tablet Take 1,000 mg by mouth every 6 (six) hours as needed (headaches/pain.).     aspirin EC 81 MG tablet Take 81 mg by mouth every evening. Swallow whole.     atorvastatin (LIPITOR) 10 MG tablet Take 10 mg by mouth at bedtime.     chlorpheniramine (CHLOR-TRIMETON) 4 MG tablet Take 4 mg by mouth in the morning and at bedtime.     famotidine (PEPCID) 20 MG tablet Take 20 mg by mouth daily after supper. Takes after supper     gabapentin (NEURONTIN) 300 MG capsule Take 1 capsule (300 mg total) by mouth 3 (three) times daily. (Patient taking differently: Take 300 mg by mouth 2 (two) times daily.) 90 capsule 2   glipiZIDE (GLUCOTROL XL) 5 MG 24 hr tablet Take 5 mg by mouth daily with breakfast.     LANTUS SOLOSTAR 100 UNIT/ML Solostar Pen Inject 45 Units into the skin in the morning.     losartan (COZAAR) 50 MG tablet Take 50 mg by mouth in the morning.     metFORMIN (GLUCOPHAGE) 1000 MG tablet Take 1,000 mg by mouth 2 (two) times daily.     pantoprazole (PROTONIX) 40 MG tablet TAKE 1 TABLET BY MOUTH ONCE DAILY 30  TO  60  MINUTES  BEFORE  BREAKFAST 90 tablet 1   No current facility-administered medications for this visit.    Allergies   Allergies as of 08/16/2022   (No Known Allergies)     Review of Systems   General: Negative for anorexia, weight loss, fever, chills, fatigue, weakness. ENT: Negative for hoarseness, difficulty swallowing , nasal congestion. CV: Negative for chest pain, angina, palpitations, dyspnea on exertion, peripheral edema.   Respiratory: Negative for dyspnea at rest, dyspnea on exertion, cough, sputum, wheezing.  GI: See history of present illness. GU:  Negative for dysuria, hematuria, urinary incontinence, urinary frequency, nocturnal urination.  Endo: Negative for unusual weight change.     Physical Exam   BP 128/74 (BP Location: Right Arm, Patient Position: Sitting, Cuff Size: Normal)   Pulse 99   Temp (!) 97.1 F (36.2 C) (Temporal)   Ht '5\' 6"'$  (1.676 m)   Wt 173 lb 6.4 oz (78.7 kg)   SpO2 (!) 86%   BMI 27.99 kg/m    General: Well-nourished, well-developed in no acute distress.  Eyes: No icterus. Mouth: Oropharyngeal mucosa moist and pink Abdomen: Bowel sounds are normal, nontender, nondistended, no hepatosplenomegaly or masses,  no abdominal bruits or hernia , no rebound or guarding.  Rectal: not performed Extremities: No lower extremity edema. No clubbing or deformities. Neuro: Alert and oriented x 4   Skin: Warm and dry, no jaundice.   Psych: Alert and cooperative, normal mood and affect.  Labs   See hpi  Imaging Studies   No results found.  Assessment   GERD: doing well on pantoprazole in am and pepcid in pm.  Barrett's esophagus with history of dysphagia: due for one year surveillance EGD at [facility]. Patient to reach out to schedule.   Constipation: fairly regular lately. Likely better off iron.   IDA: ferritin had improved in 12/2021. Hgb has  remained stable. He stopped oral iron in 06/2022. Would recommend updating labs in 10/2022.    PLAN   Pantoprazole '40mg'$  daily before breakfast. RX sent. Pepcid at dinner. Update CBC, iron/tibc/ferritin in 10/2022 when he goes for PCP labs.  He will call [facility] to schedule yearly EGD. Return to our office in one year or sooner if needed.   Laureen Ochs. Bobby Rumpf, [place], [facility]

## 2022-08-16 NOTE — Patient Instructions (Signed)
Continue pantoprazole '40mg'$  daily before breakfast. Continue famotidine at supper. We will update your labs in 10/2022 when you go for Dr. Ria Comment labs. Take our orders with you.  Reach out to [facility] regarding your next upper endoscopy being due.  Return to our office in one year or call sooner if needed.

## 2022-08-25 DIAGNOSIS — H16223 Keratoconjunctivitis sicca, not specified as Sjogren's, bilateral: Secondary | ICD-10-CM | POA: Diagnosis not present

## 2022-08-25 DIAGNOSIS — H18613 Keratoconus, stable, bilateral: Secondary | ICD-10-CM | POA: Diagnosis not present

## 2022-09-22 DIAGNOSIS — H18613 Keratoconus, stable, bilateral: Secondary | ICD-10-CM | POA: Diagnosis not present

## 2022-10-02 DIAGNOSIS — Z23 Encounter for immunization: Secondary | ICD-10-CM | POA: Diagnosis not present

## 2022-10-24 DIAGNOSIS — D509 Iron deficiency anemia, unspecified: Secondary | ICD-10-CM | POA: Diagnosis not present

## 2022-10-24 DIAGNOSIS — G609 Hereditary and idiopathic neuropathy, unspecified: Secondary | ICD-10-CM | POA: Diagnosis not present

## 2022-10-24 DIAGNOSIS — K5900 Constipation, unspecified: Secondary | ICD-10-CM | POA: Diagnosis not present

## 2022-10-24 DIAGNOSIS — K22710 Barrett's esophagus with low grade dysplasia: Secondary | ICD-10-CM | POA: Diagnosis not present

## 2022-10-24 DIAGNOSIS — K2100 Gastro-esophageal reflux disease with esophagitis, without bleeding: Secondary | ICD-10-CM | POA: Diagnosis not present

## 2022-10-24 DIAGNOSIS — Z79899 Other long term (current) drug therapy: Secondary | ICD-10-CM | POA: Diagnosis not present

## 2022-10-24 DIAGNOSIS — E1129 Type 2 diabetes mellitus with other diabetic kidney complication: Secondary | ICD-10-CM | POA: Diagnosis not present

## 2022-10-25 LAB — CBC WITH DIFFERENTIAL/PLATELET
Basophils Absolute: 0.1 10*3/uL (ref 0.0–0.2)
Basos: 1 %
EOS (ABSOLUTE): 0.2 10*3/uL (ref 0.0–0.4)
Eos: 2 %
Hematocrit: 46.2 % (ref 37.5–51.0)
Hemoglobin: 15.3 g/dL (ref 13.0–17.7)
Immature Grans (Abs): 0 10*3/uL (ref 0.0–0.1)
Immature Granulocytes: 0 %
Lymphocytes Absolute: 0.8 10*3/uL (ref 0.7–3.1)
Lymphs: 8 %
MCH: 30.4 pg (ref 26.6–33.0)
MCHC: 33.1 g/dL (ref 31.5–35.7)
MCV: 92 fL (ref 79–97)
Monocytes Absolute: 0.9 10*3/uL (ref 0.1–0.9)
Monocytes: 9 %
Neutrophils Absolute: 7.6 10*3/uL — ABNORMAL HIGH (ref 1.4–7.0)
Neutrophils: 80 %
Platelets: 347 10*3/uL (ref 150–450)
RBC: 5.04 x10E6/uL (ref 4.14–5.80)
RDW: 13.2 % (ref 11.6–15.4)
WBC: 9.5 10*3/uL (ref 3.4–10.8)

## 2022-10-25 LAB — IRON,TIBC AND FERRITIN PANEL
Ferritin: 69 ng/mL (ref 30–400)
Iron Saturation: 29 % (ref 15–55)
Iron: 82 ug/dL (ref 38–169)
Total Iron Binding Capacity: 280 ug/dL (ref 250–450)
UIBC: 198 ug/dL (ref 111–343)

## 2022-10-31 DIAGNOSIS — R7309 Other abnormal glucose: Secondary | ICD-10-CM | POA: Diagnosis not present

## 2022-10-31 DIAGNOSIS — E1122 Type 2 diabetes mellitus with diabetic chronic kidney disease: Secondary | ICD-10-CM | POA: Diagnosis not present

## 2022-10-31 DIAGNOSIS — G629 Polyneuropathy, unspecified: Secondary | ICD-10-CM | POA: Diagnosis not present

## 2022-11-03 ENCOUNTER — Other Ambulatory Visit: Payer: Self-pay

## 2022-11-03 DIAGNOSIS — D509 Iron deficiency anemia, unspecified: Secondary | ICD-10-CM

## 2022-11-26 DIAGNOSIS — J449 Chronic obstructive pulmonary disease, unspecified: Secondary | ICD-10-CM | POA: Diagnosis not present

## 2022-11-26 DIAGNOSIS — E1129 Type 2 diabetes mellitus with other diabetic kidney complication: Secondary | ICD-10-CM | POA: Diagnosis not present

## 2022-11-26 DIAGNOSIS — E785 Hyperlipidemia, unspecified: Secondary | ICD-10-CM | POA: Diagnosis not present

## 2022-11-26 DIAGNOSIS — E1122 Type 2 diabetes mellitus with diabetic chronic kidney disease: Secondary | ICD-10-CM | POA: Diagnosis not present

## 2022-12-25 DIAGNOSIS — E119 Type 2 diabetes mellitus without complications: Secondary | ICD-10-CM | POA: Diagnosis not present

## 2022-12-25 DIAGNOSIS — Z794 Long term (current) use of insulin: Secondary | ICD-10-CM | POA: Diagnosis not present

## 2022-12-25 DIAGNOSIS — K22710 Barrett's esophagus with low grade dysplasia: Secondary | ICD-10-CM | POA: Diagnosis not present

## 2022-12-25 DIAGNOSIS — Z9049 Acquired absence of other specified parts of digestive tract: Secondary | ICD-10-CM | POA: Diagnosis not present

## 2022-12-25 DIAGNOSIS — K449 Diaphragmatic hernia without obstruction or gangrene: Secondary | ICD-10-CM | POA: Diagnosis not present

## 2022-12-25 DIAGNOSIS — E785 Hyperlipidemia, unspecified: Secondary | ICD-10-CM | POA: Diagnosis not present

## 2022-12-25 DIAGNOSIS — K219 Gastro-esophageal reflux disease without esophagitis: Secondary | ICD-10-CM | POA: Diagnosis not present

## 2022-12-25 DIAGNOSIS — K2270 Barrett's esophagus without dysplasia: Secondary | ICD-10-CM | POA: Diagnosis not present

## 2022-12-25 DIAGNOSIS — Z87891 Personal history of nicotine dependence: Secondary | ICD-10-CM | POA: Diagnosis not present

## 2022-12-25 DIAGNOSIS — Z79899 Other long term (current) drug therapy: Secondary | ICD-10-CM | POA: Diagnosis not present

## 2023-01-22 DIAGNOSIS — H16223 Keratoconjunctivitis sicca, not specified as Sjogren's, bilateral: Secondary | ICD-10-CM | POA: Diagnosis not present

## 2023-01-22 DIAGNOSIS — H40023 Open angle with borderline findings, high risk, bilateral: Secondary | ICD-10-CM | POA: Diagnosis not present

## 2023-01-22 DIAGNOSIS — E119 Type 2 diabetes mellitus without complications: Secondary | ICD-10-CM | POA: Diagnosis not present

## 2023-01-22 DIAGNOSIS — H35373 Puckering of macula, bilateral: Secondary | ICD-10-CM | POA: Diagnosis not present

## 2023-01-22 DIAGNOSIS — Z947 Corneal transplant status: Secondary | ICD-10-CM | POA: Diagnosis not present

## 2023-01-29 DIAGNOSIS — I700 Atherosclerosis of aorta: Secondary | ICD-10-CM | POA: Diagnosis not present

## 2023-01-29 DIAGNOSIS — Z79899 Other long term (current) drug therapy: Secondary | ICD-10-CM | POA: Diagnosis not present

## 2023-01-29 DIAGNOSIS — G609 Hereditary and idiopathic neuropathy, unspecified: Secondary | ICD-10-CM | POA: Diagnosis not present

## 2023-01-29 DIAGNOSIS — E1129 Type 2 diabetes mellitus with other diabetic kidney complication: Secondary | ICD-10-CM | POA: Diagnosis not present

## 2023-02-05 DIAGNOSIS — R7309 Other abnormal glucose: Secondary | ICD-10-CM | POA: Diagnosis not present

## 2023-02-05 DIAGNOSIS — G629 Polyneuropathy, unspecified: Secondary | ICD-10-CM | POA: Diagnosis not present

## 2023-02-05 DIAGNOSIS — E1122 Type 2 diabetes mellitus with diabetic chronic kidney disease: Secondary | ICD-10-CM | POA: Diagnosis not present

## 2023-04-13 ENCOUNTER — Encounter: Payer: Self-pay | Admitting: Gastroenterology

## 2023-04-24 ENCOUNTER — Other Ambulatory Visit: Payer: Self-pay

## 2023-04-24 DIAGNOSIS — D509 Iron deficiency anemia, unspecified: Secondary | ICD-10-CM

## 2023-05-01 DIAGNOSIS — D509 Iron deficiency anemia, unspecified: Secondary | ICD-10-CM | POA: Diagnosis not present

## 2023-05-02 LAB — CBC WITH DIFFERENTIAL/PLATELET
Basophils Absolute: 0.2 10*3/uL (ref 0.0–0.2)
Basos: 1 %
EOS (ABSOLUTE): 0.2 10*3/uL (ref 0.0–0.4)
Eos: 2 %
Hematocrit: 38.3 % (ref 37.5–51.0)
Hemoglobin: 12.4 g/dL — ABNORMAL LOW (ref 13.0–17.7)
Immature Grans (Abs): 0.1 10*3/uL (ref 0.0–0.1)
Immature Granulocytes: 1 %
Lymphocytes Absolute: 0.9 10*3/uL (ref 0.7–3.1)
Lymphs: 9 %
MCH: 28.4 pg (ref 26.6–33.0)
MCHC: 32.4 g/dL (ref 31.5–35.7)
MCV: 88 fL (ref 79–97)
Monocytes Absolute: 1.1 10*3/uL — ABNORMAL HIGH (ref 0.1–0.9)
Monocytes: 11 %
Neutrophils Absolute: 8 10*3/uL — ABNORMAL HIGH (ref 1.4–7.0)
Neutrophils: 76 %
Platelets: 463 10*3/uL — ABNORMAL HIGH (ref 150–450)
RBC: 4.37 x10E6/uL (ref 4.14–5.80)
RDW: 13 % (ref 11.6–15.4)
WBC: 10.4 10*3/uL (ref 3.4–10.8)

## 2023-05-02 LAB — IRON,TIBC AND FERRITIN PANEL
Ferritin: 23 ng/mL — ABNORMAL LOW (ref 30–400)
Iron Saturation: 9 % — CL (ref 15–55)
Iron: 29 ug/dL — ABNORMAL LOW (ref 38–169)
Total Iron Binding Capacity: 309 ug/dL (ref 250–450)
UIBC: 280 ug/dL (ref 111–343)

## 2023-05-04 ENCOUNTER — Encounter: Payer: Self-pay | Admitting: Gastroenterology

## 2023-05-04 ENCOUNTER — Telehealth: Payer: Self-pay | Admitting: Gastroenterology

## 2023-05-04 ENCOUNTER — Ambulatory Visit: Payer: Medicare HMO | Admitting: Gastroenterology

## 2023-05-04 VITALS — BP 108/67 | HR 93 | Temp 97.3°F | Ht 66.0 in | Wt 172.8 lb

## 2023-05-04 DIAGNOSIS — K2100 Gastro-esophageal reflux disease with esophagitis, without bleeding: Secondary | ICD-10-CM

## 2023-05-04 DIAGNOSIS — D509 Iron deficiency anemia, unspecified: Secondary | ICD-10-CM | POA: Diagnosis not present

## 2023-05-04 DIAGNOSIS — K22710 Barrett's esophagus with low grade dysplasia: Secondary | ICD-10-CM

## 2023-05-04 NOTE — Telephone Encounter (Signed)
Let pt know that Dr. Jena Gauss is advising repeating his small bowel capsule study due to the IDA and previous findings.   If agreeable,  Capsule endoscopy Dx: IDA, history of abnormal small bowel

## 2023-05-04 NOTE — Progress Notes (Addendum)
GI Office Note    Referring Provider: Carylon Perches, MD Primary Care Physician:  Carylon Perches, MD  Primary Gastroenterologist: Roetta Sessions, MD   Chief Complaint   Chief Complaint  Patient presents with   Follow-up    Doing well    History of Present Illness   Christian Woodard is a 77 y.o. male presenting today for follow up. Last seen in 07/2022. H/o long segment Barrett's with low grade dysplasia followed at [facility], IDA, GERD, colon polyps. Previous celiac screen negative.  Since his last ov, he completed EGD with Dr. Alvia Grove 11/2022. Esophageal changes consistent with long segment Barrett's noted. Medium sized hiatal hernia. Biopsies from 35-37cm showed Barrett's with low-grade dysplasia. Biopsies from 33-35cm with Barrett's and negative for dysplasia.He has another EGD scheduled for next month.   Recent labs showed decline in Hgb from 15.3 to 12.4 over the past six months. His iron down from 82 to 29. Iron sat down from 29 to 8. Ferritin down from 69 to 23.   He has been off oral iron since 06/2022. Eats red meat on occasion. Eats greens daily. No heartburn. No abdominal pain. BM regular. No melena, brbpr. No unintentional weight loss.   Previous GI work-up:   Givens capsule 09/2017: localized clustered areas of white "blunted appearing' abnormal small bowel mucosa/villi of uncertain significance. Images were placed on a CD and sent to [facility] for evaluation.    CTE 10/2017: revealed circumferential mildly irregular wall thickening of small bowel, particularly proximal small bowel, without focal mass or other focal abnormality.    Small bowel enteroscopy in January 2019 with Barrett's esophagus focally positive for low-grade dysplasia, normal stomach, normal duodenum biopsied, normal examined jejunum biopsied. Small bowel biopsies were benign.     EGD January 2020 with evidence of Barrett's esophagus, negative for dysplasia.     EGD March 2021 with evidence of Barrett's  esophagus, indefinite for focal low-grade dysplasia.  No ablation has been pursued at this point.    Colonoscopy 09/2021: -One 7 mm polyp at the hepatic flexure, removed. Tubular adenoma. - Diverticulosis in the entire examined colon. - The entire examined colon is normal on direct and retroflexion views  -next colonoscopy in 09/2026  EGD 08/18/21 at [facility]:                                                                                  Impression:            - Esophageal mucosal changes consistent with                         long-segment Barrett's esophagus. Biopsied.                         - 3 cm hiatal hernia.                         - Normal examined duodenum.     Diagnosis     A: Esophagus, 35-37, biopsy - Gastroesophageal junctional mucosa with goblet cell metaplasia, consistent with Barrett esophagus - No  evidence of dysplasia   B: Esophagus, 33-35, biopsy - Gastroesophageal junctional mucosa with goblet cell metaplasia, consistent with Barrett esophagus - No evidence of dysplasia; tissue inflamed   C: Esophagus, 32, biopsy - Gastroesophageal junctional mucosa with goblet cell metaplasia, consistent with Barrett esophagus - No evidence of dysplasia    This electronic signature is attestation that the pathologist personally reviewed the submitted material(s) and the final diagnosis reflects that evaluation.    EGD with Dr. Alvia Grove 11/2022. Esophageal changes consistent with long segment Barrett's noted. Medium sized hiatal hernia. Biopsies from 35-37cm showed Barrett's with low-grade dysplasia. Biopsies from 33-35cm with Barrett's and negative for dysplasia.    Medications   Current Outpatient Medications  Medication Sig Dispense Refill   acetaminophen (TYLENOL) 500 MG tablet Take 1,000 mg by mouth every 6 (six) hours as needed (headaches/pain.).     aspirin EC 81 MG tablet Take 81 mg by mouth every evening. Swallow whole.     atorvastatin (LIPITOR) 10 MG tablet Take  10 mg by mouth at bedtime.     chlorpheniramine (CHLOR-TRIMETON) 4 MG tablet Take 4 mg by mouth in the morning and at bedtime.     cyanocobalamin (VITAMIN B12) 1000 MCG tablet Take 1,000 mcg by mouth daily.     famotidine (PEPCID) 20 MG tablet Take 20 mg by mouth daily after supper. Takes after supper     gabapentin (NEURONTIN) 300 MG capsule Take 1 capsule (300 mg total) by mouth 3 (three) times daily. (Patient taking differently: Take 300 mg by mouth 2 (two) times daily.) 90 capsule 2   glipiZIDE (GLUCOTROL XL) 5 MG 24 hr tablet Take 5 mg by mouth daily with breakfast.     LANTUS SOLOSTAR 100 UNIT/ML Solostar Pen Inject 45 Units into the skin in the morning.     losartan (COZAAR) 50 MG tablet Take 50 mg by mouth in the morning.     metFORMIN (GLUCOPHAGE) 1000 MG tablet Take 1,000 mg by mouth 2 (two) times daily.     NOVOLOG FLEXPEN 100 UNIT/ML FlexPen Inject 6 Units into the skin 2 (two) times daily.     pantoprazole (PROTONIX) 40 MG tablet TAKE 1 TABLET BY MOUTH ONCE DAILY 30  TO  60  MINUTES  BEFORE  BREAKFAST 90 tablet 3   No current facility-administered medications for this visit.    Allergies   Allergies as of 05/04/2023   (No Known Allergies)    Review of Systems   General: Negative for anorexia, weight loss, fever, chills, fatigue, weakness. ENT: Negative for hoarseness, difficulty swallowing , nasal congestion. CV: Negative for chest pain, angina, palpitations, +dyspnea on exertion, peripheral edema.  Respiratory: Negative for dyspnea at rest, +dyspnea on exertion, cough, sputum, wheezing.  GI: See history of present illness. GU:  Negative for dysuria, hematuria, urinary incontinence, urinary frequency, nocturnal urination.  Endo: Negative for unusual weight change.     Physical Exam   BP 108/67 (BP Location: Right Arm, Patient Position: Sitting, Cuff Size: Normal)   Pulse 93   Temp (!) 97.3 F (36.3 C) (Oral)   Ht 5\' 6"  (1.676 m)   Wt 172 lb 12.8 oz (78.4 kg)   BMI  27.89 kg/m    General: Well-nourished, well-developed in no acute distress.  Eyes: No icterus. Mouth: Oropharyngeal mucosa moist and pink , no lesions erythema or exudate. Lungs: Clear to auscultation bilaterally.  Heart: Regular rate and rhythm, no murmurs rubs or gallops.  Abdomen: Bowel sounds are normal, nontender, nondistended,  no hepatosplenomegaly or masses, no abdominal bruits or hernia , no rebound or guarding. Rectus diastasis. Rectal: not performed Extremities: No lower extremity edema. No clubbing or deformities. Neuro: Alert and oriented x 4   Skin: Warm and dry, no jaundice.   Psych: Alert and cooperative, normal mood and affect.  Labs   Lab Results  Component Value Date   CREATININE 1.08 09/27/2021   BUN 18 09/27/2021   NA 138 09/27/2021   K 3.8 09/27/2021   CL 104 09/27/2021   CO2 24 09/27/2021   Lab Results  Component Value Date   ALT 23 04/11/2021   AST 21 04/11/2021   ALKPHOS 58 10/07/2019   BILITOT 1.2 04/11/2021   Lab Results  Component Value Date   WBC 10.4 05/01/2023   HGB 12.4 (L) 05/01/2023   HCT 38.3 05/01/2023   MCV 88 05/01/2023   PLT 463 (H) 05/01/2023   Lab Results  Component Value Date   IRON 29 (L) 05/01/2023   TIBC 309 05/01/2023   FERRITIN 23 (L) 05/01/2023    Imaging Studies   No results found.  Assessment   GERD: well controlled.  Barrett's esophagus: h/o low grade dysplasia, referred down to [facility] and has been under their surveillance. Last EGD showed low grade dysplasia and he has another EGD scheduled for 05/2023.  IDA: off oral iron since 06/2022. With decline in Hgb and ferritin indicating recurrent IDA. No melena, brbpr. Colonoscopy in 2022. History of small bowel capsule abnormality in 2018 which was fully evaluated at [facility] with enteroscopy and CTE as outline above. Celiac serologies normal.     PLAN   Restart oral iron daily. Continue pantoprazole 40mg  daily. Keep EGD as planned next month. Discuss with Dr.  Jena Gauss. May need updated small bowel capsule study.    Leanna Battles. Melvyn Neth, MHS, PA-C [facility]

## 2023-05-04 NOTE — Patient Instructions (Addendum)
You have been provided with a copy of your recent labs so that you can share with Dr. Ouida Sills. Continue your pantoprazole before breakfast. Continue with plans for upper endoscopy next months at [facility]. I will discuss you recent changes in anemia with Dr. Jena Gauss. Further recommendations to follow.  Recommend restarting your iron supplement once daily.

## 2023-05-07 DIAGNOSIS — E1129 Type 2 diabetes mellitus with other diabetic kidney complication: Secondary | ICD-10-CM | POA: Diagnosis not present

## 2023-05-07 NOTE — Telephone Encounter (Signed)
Lmom for pt to return my call.  

## 2023-05-07 NOTE — Telephone Encounter (Signed)
Pt was made aware and is agreeable. Pt is ready to move forward with scheduling.  

## 2023-05-08 NOTE — Telephone Encounter (Signed)
PA approved. Authorization #[id number], DOS: 05/14/2023 - 07/14/2023

## 2023-05-08 NOTE — Telephone Encounter (Signed)
PA submitted via cohere. PA pending review. Tracking [id number]. Clinicals uploaded

## 2023-05-08 NOTE — Telephone Encounter (Signed)
Spoke with pt. He scheduled for 6/18. Aware will send instructions via mychart.

## 2023-05-14 DIAGNOSIS — R7309 Other abnormal glucose: Secondary | ICD-10-CM | POA: Diagnosis not present

## 2023-05-14 DIAGNOSIS — E1122 Type 2 diabetes mellitus with diabetic chronic kidney disease: Secondary | ICD-10-CM | POA: Diagnosis not present

## 2023-05-14 DIAGNOSIS — G629 Polyneuropathy, unspecified: Secondary | ICD-10-CM | POA: Diagnosis not present

## 2023-05-15 ENCOUNTER — Encounter (HOSPITAL_COMMUNITY)
Admission: RE | Disposition: A | Discharge status: Home / Self Care | Payer: Self-pay | Source: Home / Self Care | Attending: Internal Medicine

## 2023-05-15 ENCOUNTER — Encounter (HOSPITAL_COMMUNITY): Payer: Self-pay | Admitting: Internal Medicine

## 2023-05-15 ENCOUNTER — Ambulatory Visit (HOSPITAL_COMMUNITY)
Admission: RE | Admit: 2023-05-15 | Discharge: 2023-05-15 | Disposition: A | Discharge status: Home / Self Care | Payer: Medicare HMO | Attending: Internal Medicine | Admitting: Internal Medicine

## 2023-05-15 DIAGNOSIS — D509 Iron deficiency anemia, unspecified: Secondary | ICD-10-CM | POA: Diagnosis not present

## 2023-05-15 HISTORY — PX: GIVENS CAPSULE STUDY: SHX5432

## 2023-05-15 SURGERY — IMAGING PROCEDURE, GI TRACT, INTRALUMINAL, VIA CAPSULE
Anesthesia: Monitor Anesthesia Care

## 2023-05-16 NOTE — Progress Notes (Deleted)
Subjective:     Patient ID: Christian Woodard, male   DOB: 03-13-1946     MRN: [id number]    Brief patient profile:  46 yowm quit smoking 2000 with mild doe @ wt 156  and did fine until around 2013 gave up pushing mower and gradually downhill since referred to pulmonary clinic 06/14/2017 by Dr   Christian Woodard   06/14/2017 1st [facility] office visit/ Christian Woodard  No resp rx  Chief Complaint  Patient presents with   Pulmonary Consult    Referred by Dr. Carylon Woodard. Pt c/o SOB x 6 months, esp worse over the past 3 months. He states he gets out of breath walking an incline, such as to his mailbox. He states that he sometimes he wakes up in the night feeling SOB.  He also c/o non prod cough.   doe x 5 years,  Stops at top of incline on way back to house x 6 months  = MMRC1 = can walk nl pace, flat grade, can't hurry or go uphills or steps s sob   Never tried inhalers Worse in heat and humbidity  freq wakes with dry cough / chest tightness sits up and goes away  rec You do not have significant copd at this point and likely never will  Pantoprazole (protonix) 40 mg   Take  30-60 min before first meal of the day and Pepcid (famotidine)  20 mg one @  bedtime until return to office - this is the best way to tell whether stomach acid is contributing to your problem.   GERD diet     08/20/2017  f/u ov/Christian Woodard re: doe on gerd rx/ gi eval in progress for anemia/ no resp rx  Chief Complaint  Patient presents with   Follow-up    PFT's done today. Increased SOB for the past day. Chest feels heavy today.  continues to have noct spells of sob despite nl BNP and no evidence of chf on cxr or obvious anemia Tells me his hgb still tredning down and GI eval in progess rec You do not have any significant copd If you continue to have breathing issues after your anemia is corrected might consider trial off ACTOS next and /or cardiology evaluation    Admission date:  10/03/2019     Discharge Date:  10/07/2019    Discharge Diagnosis  AKI (acute kidney injury) (HCC) [N17.9] COVID-19 virus infection [U07.1]     Acute respiratory failure with hypoxia (HCC)   Acute respiratory disease due to COVID-19 virus   Type 2 diabetes mellitus without complication (HCC)   Essential hypertension    HPI: Christian Woodard is a 77 y.o. male with a history of diabetes, hyperlipidemia, hypertension.  Patient seen for shortness of breath, cough, and weakness that started a couple days PTA and gradually worsened.  He was tested 10 days PTA and got results earlier this week for Covid.  He was tested due to an outbreak at his church where he goes and flulike symptoms with body aches, decreased appetite, nausea.  Nonproductive cough for the past 2 days that is worse with exertion and better with rest.    Hospital Course  Acute hypoxic respiratory failure due to COVID-19  pneumonia -This has resolved, he is on room air on discharge, ambulated in the hallway where he sustained saturation more than 92% on room air, was encouraged to take his incentive spirometry flutter valve, and keep using with , rated with IV remdesivir x5 days  during hospital stay, treated with Decadron, he will be discharged on another 5 days to finish total 10 days of treatment . -Chest x-ray significant for COVID-19 pneumonia. -Was encouraged to use incentive spirometry, flutter valve, and get out of bed to chair and to prone    Virtual Visit via Telephone Note 10/27/2019   Post covid  I connected with Christian Woodard on 10/27/19 at  2:20 PM EST(did not reach on 1st attempt) by telephone and verified that I am speaking with the correct person using two identifiers.   I discussed the limitations, risks, security and privacy concerns of performing an evaluation and management service by telephone and the availability of in person appointments. I also discussed with the patient that there may be a patient responsible charge related to this service. The  patient expressed understanding and agreed to proceed.    History of Present Illness: Dyspnea:  2 laps off 02 prior to discharge / trend is improving at home but not checking sats  Cough: minimal clear  Sleeping: flat/ one pillow SABA use: none 02: none  Dex last day 10/12/2019 rec Make sure you check your oxygen saturation at your highest level of activity to be sure it stays over 90% and keep track of it at least once a week, more often if breathing getting worse, and let me know if losing ground.>>> did not do       01/19/2021  f/u ov/Christian Woodard office/Christian Woodard re: doe p covid  Chief Complaint  Patient presents with   Follow-up    Breathing is overall doing well and no new co's today.    Dyspnea:  Up and down the hill to the mb daily feels it getting a little harder than it was about a month ago since feet started bothering him with paresthesias and lack of balance  Cough: none/ some p eating assoc with mild dysphagia ? Barrett's  Sleeping: flat bed / R side / 1 pillow SABA use: none  02: none Covid status: vax x 3  rec Stay as active as you can and call if losing ground with exercise tolerance  Pulmonary follow up is as directed by Dr Christian Woodard  Late add:  Based on desats with no explanation and c/o worse ex tol > HRCT/pfts next step > no specific findings/ 7 mm nodule needs f/u @ 6-12 m    02/18/2021  f/u ov/[name] office/Christian Woodard re: doe p covid Chief Complaint  Patient presents with   Follow-up    Non productive cough since last friday   Dyspnea:  Maybe 150 ft to MB some better  Cough: new c/o x one week of throat tickle but not sense of sinus problems or pnds or excess mucus  Sleeping: bed is mostly flat  SABA use: none  02: none  Covid status: vax x 3 and had covid Rec Pantoprazole (protonix) 40 mg   Take  30-60 min before first meal of the day and Pepcid (famotidine)  20 mg one after supper   GERD diet reviewed, bed blocks rec  Make sure you check your oxygen  saturation at your highest level of activity to be sure it stays over 90%          08/18/2021  f/u ov/[place] office/Christian Woodard re: uacs maint on ppi ac and pepcid hs with Fe def anemia w/u in progress  Chief Complaint  Patient presents with   Follow-up    Pt reports that the change of weather does affect his breathing but  it has improved since last visit. Pt feels that he is coughing more but has endoscopy today.   Dyspnea:  some better from mailbox to house with hgb improving  Using pedal ex x 15 min   Cough: still an issue, dry worse when lie down Sleeping: bed is slt raised, one pilow  SABA use: none  02: none  Covid status: vax x 3 and covid  Rec Increase gabapentin to where an extra pill first in evening  then add a pill weekly until cough is gone with a  maximum 300 mg three times a day and call me with the amount that you need to eliminate the cough For drainage / throat tickle try take CHLORPHENIRAMINE  4 mg  Make sure you check your oxygen saturation at your highest level of activity to be sure it stays over 90%  Echo 10/05/2021  G1 diastolic dysfunction only,nl la ra Please schedule a follow up visit in 3 months but call sooner if needed     11/22/2021  f/u ov/[place] office/Jailani Hogans re: doe/uacs maint on gabapentin 400 mg per day  Chief Complaint  Patient presents with   Follow-up    Echo done 10-05-21  Cold weather is making breathing worse.   Would like flu shot today   Dyspnea:appears to correlate with hgb Cough: no more noct noct cough p h1 at hs Sleeping: perfectly flat bed, one pillow   SABA use: none  02: none  Covid status: vax x 3 and covid infection Gi w/u in progress for hgb 6 Sob also if bends over Rec Gabapentin try 300 mg twice daily and if not satisfied go ahead with 3 x daily  Don't push exercise any harder until you get your hemoglobin problem corrected Pulmonary follow up is as needed    Late add:  need to start LDCT in Spring of 2023 unless being  coordinated by his PCP    05/17/2023  ACUTE  ov/[place] office/Afra Tricarico re: *** maint on ***  No chief complaint on file.   Dyspnea:  *** Cough: *** Sleeping: *** SABA use: *** 02: *** Covid status: *** Lung cancer screening: ***   No obvious day to day or daytime variability or assoc excess/ purulent sputum or mucus plugs or hemoptysis or cp or chest tightness, subjective wheeze or overt sinus or hb symptoms.   *** without nocturnal  or early am exacerbation  of respiratory  c/o's or need for noct saba. Also denies any obvious fluctuation of symptoms with weather or environmental changes or other aggravating or alleviating factors except as outlined above   No unusual exposure hx or h/o childhood pna/ asthma or knowledge of premature birth.  Current Allergies, Complete Past Medical History, Past Surgical History, Family History, and Social History were reviewed in [employer] record.  ROS  The following are not active complaints unless bolded Hoarseness, sore throat, dysphagia, dental problems, itching, sneezing,  nasal congestion or discharge of excess mucus or purulent secretions, ear ache,   fever, chills, sweats, unintended wt loss or wt gain, classically pleuritic or exertional cp,  orthopnea pnd or arm/hand swelling  or leg swelling, presyncope, palpitations, abdominal pain, anorexia, nausea, vomiting, diarrhea  or change in bowel habits or change in bladder habits, change in stools or change in urine, dysuria, hematuria,  rash, arthralgias, visual complaints, headache, numbness, weakness or ataxia or problems with walking or coordination,  change in mood or  memory.        No  outpatient medications have been marked as taking for the 05/17/23 encounter (Appointment) with Nyoka Cowden, MD.           No obvious day to day or daytime variability or assoc excess/ purulent sputum or mucus plugs or hemoptysis or cp or chest tightness, subjective wheeze or  overt sinus or hb symptoms.   Sleeping  without nocturnal  or early am exacerbation  of respiratory  c/o's or need for noct saba. Also denies any obvious fluctuation of symptoms with weather or environmental changes or other aggravating or alleviating factors except as outlined above   No unusual exposure hx or h/o childhood pna/ asthma or knowledge of premature birth.  Current Allergies, Complete Past Medical History, Past Surgical History, Family History, and Social History were reviewed in [employer] record.  ROS  The following are not active complaints unless bolded Hoarseness, sore throat, dysphagia, dental problems, itching, sneezing,  nasal congestion or discharge of excess mucus or purulent secretions, ear ache,   fever, chills, sweats, unintended wt loss or wt gain, classically pleuritic or exertional cp,  orthopnea pnd or arm/hand swelling  or leg swelling, presyncope, palpitations, abdominal pain, anorexia, nausea, vomiting, diarrhea  or change in bowel habits or change in bladder habits, change in stools or change in urine, dysuria, hematuria,  rash, arthralgias, visual complaints, headache, numbness, weakness or ataxia or problems with walking or coordination,  change in mood or  memory.                        Objective:   Physical Exam   Wts  05/17/2023         ***  11/22/2021      174    08/18/2021       170  02/18/2021       175 01/19/2021       173   08/20/2017      179   06/14/17 184 lb (83.5 kg)  02/06/17 185 lb (83.9 kg)  01/24/17 184 lb (83.5 kg)     Vital signs reviewed  05/17/2023  - Note at rest 02 sats  ***% on ***   General appearance:    ***  2/6 sem s increase in P2 ***           Assessment:

## 2023-05-17 ENCOUNTER — Ambulatory Visit: Payer: Medicare HMO | Admitting: Internal Medicine

## 2023-05-18 ENCOUNTER — Other Ambulatory Visit: Payer: Self-pay

## 2023-05-18 ENCOUNTER — Emergency Department (HOSPITAL_COMMUNITY): Payer: Medicare HMO

## 2023-05-18 ENCOUNTER — Observation Stay (HOSPITAL_COMMUNITY)
Admission: EM | Admit: 2023-05-18 | Discharge: 2023-05-19 | Disposition: A | Discharge status: Home Health | Payer: Medicare HMO | Attending: Internal Medicine | Admitting: Internal Medicine

## 2023-05-18 ENCOUNTER — Observation Stay (HOSPITAL_COMMUNITY): Payer: Medicare HMO

## 2023-05-18 ENCOUNTER — Encounter (HOSPITAL_COMMUNITY): Payer: Self-pay | Admitting: Emergency Medicine

## 2023-05-18 ENCOUNTER — Encounter: Payer: Self-pay | Admitting: Internal Medicine

## 2023-05-18 ENCOUNTER — Ambulatory Visit: Payer: Medicare HMO | Admitting: Internal Medicine

## 2023-05-18 VITALS — BP 105/62 | HR 107 | Ht 66.0 in | Wt 171.6 lb

## 2023-05-18 DIAGNOSIS — R1310 Dysphagia, unspecified: Secondary | ICD-10-CM

## 2023-05-18 DIAGNOSIS — R911 Solitary pulmonary nodule: Secondary | ICD-10-CM | POA: Diagnosis not present

## 2023-05-18 DIAGNOSIS — Z794 Long term (current) use of insulin: Secondary | ICD-10-CM | POA: Insufficient documentation

## 2023-05-18 DIAGNOSIS — Z87891 Personal history of nicotine dependence: Secondary | ICD-10-CM | POA: Insufficient documentation

## 2023-05-18 DIAGNOSIS — Z7984 Long term (current) use of oral hypoglycemic drugs: Secondary | ICD-10-CM | POA: Diagnosis not present

## 2023-05-18 DIAGNOSIS — Z1152 Encounter for screening for COVID-19: Secondary | ICD-10-CM | POA: Insufficient documentation

## 2023-05-18 DIAGNOSIS — E119 Type 2 diabetes mellitus without complications: Secondary | ICD-10-CM | POA: Diagnosis not present

## 2023-05-18 DIAGNOSIS — Z8616 Personal history of COVID-19: Secondary | ICD-10-CM | POA: Diagnosis not present

## 2023-05-18 DIAGNOSIS — J9601 Acute respiratory failure with hypoxia: Principal | ICD-10-CM | POA: Insufficient documentation

## 2023-05-18 DIAGNOSIS — I110 Hypertensive heart disease with heart failure: Secondary | ICD-10-CM | POA: Insufficient documentation

## 2023-05-18 DIAGNOSIS — Z79899 Other long term (current) drug therapy: Secondary | ICD-10-CM | POA: Diagnosis not present

## 2023-05-18 DIAGNOSIS — I509 Heart failure, unspecified: Secondary | ICD-10-CM | POA: Diagnosis not present

## 2023-05-18 DIAGNOSIS — R0902 Hypoxemia: Secondary | ICD-10-CM | POA: Diagnosis not present

## 2023-05-18 DIAGNOSIS — D500 Iron deficiency anemia secondary to blood loss (chronic): Secondary | ICD-10-CM | POA: Insufficient documentation

## 2023-05-18 DIAGNOSIS — K219 Gastro-esophageal reflux disease without esophagitis: Secondary | ICD-10-CM | POA: Insufficient documentation

## 2023-05-18 DIAGNOSIS — I10 Essential (primary) hypertension: Secondary | ICD-10-CM | POA: Diagnosis present

## 2023-05-18 DIAGNOSIS — R0602 Shortness of breath: Secondary | ICD-10-CM

## 2023-05-18 DIAGNOSIS — I5033 Acute on chronic diastolic (congestive) heart failure: Secondary | ICD-10-CM | POA: Insufficient documentation

## 2023-05-18 DIAGNOSIS — J449 Chronic obstructive pulmonary disease, unspecified: Secondary | ICD-10-CM | POA: Diagnosis not present

## 2023-05-18 DIAGNOSIS — D649 Anemia, unspecified: Secondary | ICD-10-CM | POA: Diagnosis not present

## 2023-05-18 DIAGNOSIS — J811 Chronic pulmonary edema: Secondary | ICD-10-CM | POA: Diagnosis not present

## 2023-05-18 DIAGNOSIS — Z7982 Long term (current) use of aspirin: Secondary | ICD-10-CM | POA: Diagnosis not present

## 2023-05-18 DIAGNOSIS — R0609 Other forms of dyspnea: Secondary | ICD-10-CM | POA: Diagnosis not present

## 2023-05-18 DIAGNOSIS — D509 Iron deficiency anemia, unspecified: Secondary | ICD-10-CM | POA: Diagnosis present

## 2023-05-18 LAB — CBC WITH DIFFERENTIAL/PLATELET
Abs Immature Granulocytes: 0.06 10*3/uL (ref 0.00–0.07)
Basophils Absolute: 0.1 10*3/uL (ref 0.0–0.1)
Basophils Relative: 1 %
Eosinophils Absolute: 0.2 10*3/uL (ref 0.0–0.5)
Eosinophils Relative: 1 %
HCT: 38 % — ABNORMAL LOW (ref 39.0–52.0)
Hemoglobin: 12.1 g/dL — ABNORMAL LOW (ref 13.0–17.0)
Immature Granulocytes: 1 %
Lymphocytes Relative: 9 %
Lymphs Abs: 1.1 10*3/uL (ref 0.7–4.0)
MCH: 27.5 pg (ref 26.0–34.0)
MCHC: 31.8 g/dL (ref 30.0–36.0)
MCV: 86.4 fL (ref 80.0–100.0)
Monocytes Absolute: 1 10*3/uL (ref 0.1–1.0)
Monocytes Relative: 9 %
Neutro Abs: 9.3 10*3/uL — ABNORMAL HIGH (ref 1.7–7.7)
Neutrophils Relative %: 79 %
Platelets: 464 10*3/uL — ABNORMAL HIGH (ref 150–400)
RBC: 4.4 MIL/uL (ref 4.22–5.81)
RDW: 14.5 % (ref 11.5–15.5)
WBC: 11.7 10*3/uL — ABNORMAL HIGH (ref 4.0–10.5)
nRBC: 0 % (ref 0.0–0.2)

## 2023-05-18 LAB — CBG MONITORING, ED
Glucose-Capillary: 38 mg/dL — CL (ref 70–99)
Glucose-Capillary: 42 mg/dL — CL (ref 70–99)
Glucose-Capillary: 113 mg/dL — ABNORMAL HIGH (ref 70–99)

## 2023-05-18 LAB — BLOOD GAS, ARTERIAL
Acid-base deficit: 2.7 mmol/L — ABNORMAL HIGH (ref 0.0–2.0)
Bicarbonate: 21.8 mmol/L (ref 20.0–28.0)
Drawn by: 23430
O2 Saturation: 95.2 %
Patient temperature: 36.5
pCO2 arterial: 35 mmHg (ref 32–48)
pH, Arterial: 7.4 (ref 7.35–7.45)
pO2, Arterial: 66 mmHg — ABNORMAL LOW (ref 83–108)

## 2023-05-18 LAB — COMPREHENSIVE METABOLIC PANEL
ALT: 48 U/L — ABNORMAL HIGH (ref 0–44)
AST: 32 U/L (ref 15–41)
Albumin: 3.9 g/dL (ref 3.5–5.0)
Alkaline Phosphatase: 80 U/L (ref 38–126)
Anion gap: 9 (ref 5–15)
BUN: 14 mg/dL (ref 8–23)
CO2: 23 mmol/L (ref 22–32)
Calcium: 9.2 mg/dL (ref 8.9–10.3)
Chloride: 106 mmol/L (ref 98–111)
Creatinine, Ser: 1.1 mg/dL (ref 0.61–1.24)
GFR, Estimated: >60 mL/min (ref >60)
Glucose, Bld: 68 mg/dL — ABNORMAL LOW (ref 70–99)
Potassium: 4.1 mmol/L (ref 3.5–5.1)
Sodium: 138 mmol/L (ref 135–145)
Total Bilirubin: 1.6 mg/dL — ABNORMAL HIGH (ref 0.3–1.2)
Total Protein: 7.5 g/dL (ref 6.5–8.1)

## 2023-05-18 LAB — GLUCOSE, CAPILLARY: Glucose-Capillary: 95 mg/dL (ref 70–99)

## 2023-05-18 LAB — HEMOGLOBIN A1C
Hgb A1c MFr Bld: 6.6 % — ABNORMAL HIGH (ref 4.8–5.6)
Mean Plasma Glucose: 142.72 mg/dL

## 2023-05-18 LAB — TROPONIN I (HIGH SENSITIVITY)
Troponin I (High Sensitivity): 11 ng/L (ref <18)
Troponin I (High Sensitivity): 11 ng/L (ref <18)

## 2023-05-18 LAB — TYPE AND SCREEN
ABO/RH(D): A POS
Antibody Screen: NEGATIVE

## 2023-05-18 LAB — SARS CORONAVIRUS 2 BY RT PCR: SARS Coronavirus 2 by RT PCR: NEGATIVE

## 2023-05-18 LAB — D-DIMER, QUANTITATIVE: D-Dimer, Quant: 0.46 ug/mL-FEU (ref 0.00–0.50)

## 2023-05-18 MED ORDER — PANTOPRAZOLE SODIUM 40 MG PO TBEC
40.0000 mg | DELAYED_RELEASE_TABLET | Freq: Every day | ORAL | Status: DC
  Administered 2023-05-19: 40 mg via ORAL
  Filled 2023-05-18: qty 1

## 2023-05-18 MED ORDER — FUROSEMIDE 10 MG/ML IJ SOLN
20.0000 mg | Freq: Once | INTRAMUSCULAR | Status: AC
  Administered 2023-05-18: 20 mg via INTRAVENOUS
  Filled 2023-05-18: qty 2

## 2023-05-18 MED ORDER — ATORVASTATIN CALCIUM 10 MG PO TABS
10.0000 mg | ORAL_TABLET | Freq: Every day | ORAL | Status: DC
  Administered 2023-05-18: 10 mg via ORAL
  Filled 2023-05-18: qty 1

## 2023-05-18 MED ORDER — INSULIN ASPART 100 UNIT/ML FLEXPEN: 6.0000 [IU] | PEN_INJECTOR | Freq: Two times a day (BID) | SUBCUTANEOUS | Status: DC

## 2023-05-18 MED ORDER — ACETAMINOPHEN 325 MG PO TABS: 650.0000 mg | ORAL_TABLET | ORAL | Status: DC | PRN

## 2023-05-18 MED ORDER — INSULIN ASPART 100 UNIT/ML IJ SOLN: 0.0000 [IU] | Freq: Every day | INTRAMUSCULAR | Status: DC

## 2023-05-18 MED ORDER — METFORMIN HCL 500 MG PO TABS
1000.0000 mg | ORAL_TABLET | Freq: Two times a day (BID) | ORAL | Status: DC
  Administered 2023-05-18 – 2023-05-19 (×2): 1000 mg via ORAL
  Filled 2023-05-18 (×2): qty 2

## 2023-05-18 MED ORDER — SODIUM CHLORIDE 0.9 % IV BOLUS
500.0000 mL | Freq: Once | INTRAVENOUS | Status: AC
  Administered 2023-05-18: 500 mL via INTRAVENOUS

## 2023-05-18 MED ORDER — ALBUTEROL SULFATE HFA 108 (90 BASE) MCG/ACT IN AERS: 2.0000 | INHALATION_SPRAY | RESPIRATORY_TRACT | Status: DC | PRN

## 2023-05-18 MED ORDER — GLIPIZIDE ER 5 MG PO TB24: 5.0000 mg | ORAL_TABLET | Freq: Every day | ORAL | Status: DC

## 2023-05-18 MED ORDER — VITAMIN B-12 1000 MCG PO TABS
1000.0000 ug | ORAL_TABLET | Freq: Every day | ORAL | Status: DC
  Administered 2023-05-19: 1000 ug via ORAL
  Filled 2023-05-18: qty 1

## 2023-05-18 MED ORDER — SODIUM CHLORIDE 0.9 % IV SOLN: 250.0000 mL | INTRAVENOUS | Status: DC | PRN

## 2023-05-18 MED ORDER — FAMOTIDINE 20 MG PO TABS
20.0000 mg | ORAL_TABLET | Freq: Every day | ORAL | Status: DC
  Administered 2023-05-18: 20 mg via ORAL
  Filled 2023-05-18: qty 1

## 2023-05-18 MED ORDER — BUDESONIDE 0.25 MG/2ML IN SUSP
0.2500 mg | Freq: Two times a day (BID) | RESPIRATORY_TRACT | Status: DC
  Administered 2023-05-18 – 2023-05-19 (×2): 0.25 mg via RESPIRATORY_TRACT
  Filled 2023-05-18 (×2): qty 2

## 2023-05-18 MED ORDER — INSULIN ASPART 100 UNIT/ML IJ SOLN: 0.0000 [IU] | Freq: Three times a day (TID) | INTRAMUSCULAR | Status: DC

## 2023-05-18 MED ORDER — SODIUM CHLORIDE 0.9% FLUSH
3.0000 mL | Freq: Two times a day (BID) | INTRAVENOUS | Status: DC
  Administered 2023-05-18 – 2023-05-19 (×2): 3 mL via INTRAVENOUS

## 2023-05-18 MED ORDER — GABAPENTIN 300 MG PO CAPS
300.0000 mg | ORAL_CAPSULE | Freq: Three times a day (TID) | ORAL | Status: DC
  Administered 2023-05-18 – 2023-05-19 (×3): 300 mg via ORAL
  Filled 2023-05-18 (×3): qty 1

## 2023-05-18 MED ORDER — ENOXAPARIN SODIUM 40 MG/0.4ML IJ SOSY
40.0000 mg | PREFILLED_SYRINGE | INTRAMUSCULAR | Status: DC
  Administered 2023-05-18: 40 mg via SUBCUTANEOUS
  Filled 2023-05-18: qty 0.4

## 2023-05-18 MED ORDER — ASPIRIN 81 MG PO TBEC
81.0000 mg | DELAYED_RELEASE_TABLET | Freq: Every evening | ORAL | Status: DC
  Administered 2023-05-18: 81 mg via ORAL
  Filled 2023-05-18: qty 1

## 2023-05-18 MED ORDER — SODIUM CHLORIDE 0.9% FLUSH: 3.0000 mL | INTRAVENOUS | Status: DC | PRN

## 2023-05-18 MED ORDER — ACETAMINOPHEN 500 MG PO TABS: 1000.0000 mg | ORAL_TABLET | Freq: Four times a day (QID) | ORAL | Status: DC | PRN

## 2023-05-18 MED ORDER — FERROUS GLUCONATE 324 (38 FE) MG PO TABS
324.0000 mg | ORAL_TABLET | Freq: Every day | ORAL | Status: DC
  Administered 2023-05-19: 324 mg via ORAL
  Filled 2023-05-18 (×4): qty 1

## 2023-05-18 MED ORDER — ONDANSETRON HCL 4 MG/2ML IJ SOLN: 4.0000 mg | Freq: Four times a day (QID) | INTRAMUSCULAR | Status: DC | PRN

## 2023-05-18 MED ORDER — SODIUM CHLORIDE 0.9 % IV SOLN: INTRAVENOUS | Status: DC

## 2023-05-18 MED ORDER — LOSARTAN POTASSIUM 50 MG PO TABS
50.0000 mg | ORAL_TABLET | Freq: Every day | ORAL | Status: DC
  Administered 2023-05-19: 50 mg via ORAL
  Filled 2023-05-18: qty 1

## 2023-05-18 NOTE — Assessment & Plan Note (Signed)
CT s contrast  05/25/22 1. Interval resolution of right upper lobe pulmonary nodule in keeping with an infectious or inflammatory process on prior examination. No suspicious or indeterminate pulmonary nodules identified. 2. Moderate centrilobular emphysema. 3. Mild subpleural pulmonary fibrosis. 4. Shotty mediastinal adenopathy, slightly progressive since prior examination, nonspecific. This may be reactive or inflammatory in nature. 5. Enlargement of the central pulmonary arteries in keeping with changes of pulmonary arterial hypertension. 6. Extensive multi-vessel coronary artery calcification.  Finding on prior CT scan would not explain resting desaturation > will need to be repeated but defer to ER choice of CTa vs HRCT and f/u in 6 weeks

## 2023-05-18 NOTE — Progress Notes (Signed)
Subjective:     Patient ID: Christian Woodard, male   DOB: 1946/10/07     MRN: [id number]    Brief patient profile:  53 yowm quit smoking 2000 with mild doe @ wt 156  and did fine until around 2013 gave up pushing mower and gradually downhill since referred to pulmonary clinic 06/14/2017 by Dr   Ouida Sills   06/14/2017 1st [facility] office visit/ Nichole Neyer  No resp rx  Chief Complaint  Patient presents with   Pulmonary Consult    Referred by Dr. Carylon Perches. Pt c/o SOB x 6 months, esp worse over the past 3 months. He states he gets out of breath walking an incline, such as to his mailbox. He states that he sometimes he wakes up in the night feeling SOB.  He also c/o non prod cough.   doe x 5 years,  Stops at top of incline on way back to house x 6 months  = MMRC1 = can walk nl pace, flat grade, can't hurry or go uphills or steps s sob   Never tried inhalers Worse in heat and humbidity  freq wakes with dry cough / chest tightness sits up and goes away  rec You do not have significant copd at this point and likely never will  Pantoprazole (protonix) 40 mg   Take  30-60 min before first meal of the day and Pepcid (famotidine)  20 mg one @  bedtime until return to office - this is the best way to tell whether stomach acid is contributing to your problem.   GERD diet     08/20/2017  f/u ov/Imanol Bihl re: doe on gerd rx/ gi eval in progress for anemia/ no resp rx  Chief Complaint  Patient presents with   Follow-up    PFT's done today. Increased SOB for the past day. Chest feels heavy today.  continues to have noct spells of sob despite nl BNP and no evidence of chf on cxr or obvious anemia Tells me his hgb still trending down and GI eval in progess rec You do not have any significant copd If you continue to have breathing issues after your anemia is corrected might consider trial off ACTOS next and /or cardiology evaluation    Admission date:  10/03/2019     Discharge Date:  10/07/2019    Discharge Diagnosis  AKI (acute kidney injury) (HCC) [N17.9] COVID-19 virus infection [U07.1]     Acute respiratory failure with hypoxia (HCC)   Acute respiratory disease due to COVID-19 virus   Type 2 diabetes mellitus without complication (HCC)   Essential hypertension    HPI: Christian Woodard is a 77 y.o. male with a history of diabetes, hyperlipidemia, hypertension.  Patient seen for shortness of breath, cough, and weakness that started a couple days PTA and gradually worsened.  He was tested 10 days PTA and got results earlier this week for Covid.  He was tested due to an outbreak at his church where he goes and flulike symptoms with body aches, decreased appetite, nausea.  Nonproductive cough for the past 2 days that is worse with exertion and better with rest.    Hospital Course  Acute hypoxic respiratory failure due to COVID-19  pneumonia -This has resolved, he is on room air on discharge, ambulated in the hallway where he sustained saturation more than 92% on room air, was encouraged to take his incentive spirometry flutter valve, and keep using with , rated with IV remdesivir x5 days  during hospital stay, treated with Decadron, he will be discharged on another 5 days to finish total 10 days of treatment . -Chest x-ray significant for COVID-19 pneumonia. -Was encouraged to use incentive spirometry, flutter valve, and get out of bed to chair and to prone    Virtual Visit via Telephone Note 10/27/2019   Post covid  I connected with Christian Woodard on 10/27/19 at  2:20 PM EST(did not reach on 1st attempt) by telephone and verified that I am speaking with the correct person using two identifiers.   I discussed the limitations, risks, security and privacy concerns of performing an evaluation and management service by telephone and the availability of in person appointments. I also discussed with the patient that there may be a patient responsible charge related to this service. The  patient expressed understanding and agreed to proceed.    History of Present Illness: Dyspnea:  2 laps off 02 prior to discharge / trend is improving at home but not checking sats  Cough: minimal clear  Sleeping: flat/ one pillow SABA use: none 02: none  Dex last day 10/12/2019 rec Make sure you check your oxygen saturation at your highest level of activity to be sure it stays over 90% and keep track of it at least once a week, more often if breathing getting worse, and let me know if losing ground.>>> did not do       01/19/2021  f/u ov/[place] office/Frank Pilger re: doe p covid  Chief Complaint  Patient presents with   Follow-up    Breathing is overall doing well and no new co's today.    Dyspnea:  Up and down the hill to the mb daily feels it getting a little harder than it was about a month ago since feet started bothering him with paresthesias and lack of balance  Cough: none/ some p eating assoc with mild dysphagia ? Barrett's  Sleeping: flat bed / R side / 1 pillow SABA use: none  02: none Covid status: vax x 3  rec Stay as active as you can and call if losing ground with exercise tolerance  Pulmonary follow up is as directed by Dr Ouida Sills  Late add:  Based on desats with no explanation and c/o worse ex tol > HRCT/pfts next step > no specific findings/ 7 mm nodule needs f/u @ 6-12 m    02/18/2021  f/u ov/[place] office/Channon Ambrosini re: doe p covid Chief Complaint  Patient presents with   Follow-up    Non productive cough since last friday   Dyspnea:  Maybe 150 ft to MB some better  Cough: new c/o x one week of throat tickle but not sense of sinus problems or pnds or excess mucus  Sleeping: bed is mostly flat  SABA use: none  02: none  Covid status: vax x 3 and had covid Rec Pantoprazole (protonix) 40 mg   Take  30-60 min before first meal of the day and Pepcid (famotidine)  20 mg one after supper   GERD diet reviewed, bed blocks rec  Make sure you check your oxygen  saturation at your highest level of activity to be sure it stays over 90%          08/18/2021  f/u ov/[place] office/Devynn Scheff re: uacs maint on ppi ac and pepcid hs with Fe def anemia w/u in progress  Chief Complaint  Patient presents with   Follow-up    Pt reports that the change of weather does affect his breathing but  it has improved since last visit. Pt feels that he is coughing more but has endoscopy today.   Dyspnea:  some better from mailbox to house with hgb improving  Using pedal ex x 15 min   Cough: still an issue, dry worse when lie down Sleeping: bed is slt raised, one pilow  SABA use: none  02: none  Covid status: vax x 3 and covid  Rec Increase gabapentin to where an extra pill first in evening  then add a pill weekly until cough is gone with a  maximum 300 mg three times a day and call me with the amount that you need to eliminate the cough For drainage / throat tickle try take CHLORPHENIRAMINE  4 mg  Make sure you check your oxygen saturation at your highest level of activity to be sure it stays over 90%  Echo 10/05/2021  G1 diastolic dysfunction only,nl la ra Please schedule a follow up visit in 3 months but call sooner if needed     11/22/2021  f/u ov/[place] office/Glen Kesinger re: doe/uacs maint on gabapentin 400 mg per day  Chief Complaint  Patient presents with   Follow-up    Echo done 10-05-21  Cold weather is making breathing worse.   Would like flu shot today   Dyspnea:appears to correlate with hgb Cough: no more noct noct cough p h1 at hs Sleeping: perfectly flat bed, one pillow   SABA use: none  02: none  Covid status: vax x 3 and covid infection Gi w/u in progress for hgb 6 Sob also if bends over Rec Gabapentin try 300 mg twice daily and if not satisfied go ahead with 3 x daily  Don't push exercise any harder until you get your hemoglobin problem corrected Pulmonary follow up is as needed    Late add:  needs LDCT in  04/2023 due     05/18/2023  ACUTE   ov/[place] office/Megham Dwyer re: worse sob x 4 weeks  maint on no resp rx  / no 02  Chief Complaint  Patient presents with   Acute Visit  Dyspnea:  4 weeks prior to OV  could have done mb and back uphill to house then gradually worse since and on day of ov x 20-30 ft  Cough: a little more but dry  Sleeping: flat bed one pillow  SABA use: none  02: none  Stools dark on fe rx     No obvious day to day or daytime variability or assoc excess/ purulent sputum or mucus plugs or hemoptysis or cp or chest tightness, subjective wheeze or overt sinus or hb symptoms.   Sleeping ok  without nocturnal  or early am exacerbation  of respiratory  c/o's or need for noct saba. Also denies any obvious fluctuation of symptoms with weather or environmental changes or other aggravating or alleviating factors except as outlined above   No unusual exposure hx or h/o childhood pna/ asthma or knowledge of premature birth.  Current Allergies, Complete Past Medical History, Past Surgical History, Family History, and Social History were reviewed in [employer] record.  ROS  The following are not active complaints unless bolded Hoarseness, sore throat, dysphagia, dental problems, itching, sneezing,  nasal congestion or discharge of excess mucus or purulent secretions, ear ache,   fever, chills, sweats, unintended wt loss or wt gain, classically pleuritic or exertional cp,  orthopnea pnd or arm/hand swelling  or leg swelling, presyncope, palpitations, abdominal pain, anorexia, nausea, vomiting, diarrhea  or change  in bowel habits or change in bladder habits, change in stools or change in urine, dysuria, hematuria,  rash, arthralgias, visual complaints, headache, numbness, weakness or ataxia or problems with walking or coordination,  change in mood or  memory.        Current Meds  Medication Sig   acetaminophen (TYLENOL) 500 MG tablet Take 1,000 mg by mouth every 6 (six) hours as needed  (headaches/pain.).   aspirin EC 81 MG tablet Take 81 mg by mouth every evening. Swallow whole.   atorvastatin (LIPITOR) 10 MG tablet Take 10 mg by mouth at bedtime.   chlorpheniramine (CHLOR-TRIMETON) 4 MG tablet Take 4 mg by mouth in the morning and at bedtime.   cyanocobalamin (VITAMIN B12) 1000 MCG tablet Take 1,000 mcg by mouth daily.   famotidine (PEPCID) 20 MG tablet Take 20 mg by mouth daily after supper. Takes after supper   gabapentin (NEURONTIN) 300 MG capsule Take 1 capsule (300 mg total) by mouth 3 (three) times daily. (Patient taking differently: Take 300 mg by mouth 2 (two) times daily.)   glipiZIDE (GLUCOTROL XL) 5 MG 24 hr tablet Take 5 mg by mouth daily with breakfast.   LANTUS SOLOSTAR 100 UNIT/ML Solostar Pen Inject 45 Units into the skin in the morning.   losartan (COZAAR) 50 MG tablet Take 50 mg by mouth in the morning.   metFORMIN (GLUCOPHAGE) 1000 MG tablet Take 1,000 mg by mouth 2 (two) times daily.   NOVOLOG FLEXPEN 100 UNIT/ML FlexPen Inject 6 Units into the skin 2 (two) times daily.   pantoprazole (PROTONIX) 40 MG tablet TAKE 1 TABLET BY MOUTH ONCE DAILY 30  TO  60  MINUTES  BEFORE  BREAKFAST                 Objective:   Physical Exam   Wts  05/18/2023        171  11/22/2021      174    08/18/2021       170  02/18/2021       175 01/19/2021       173   08/20/2017      179   06/14/17 184 lb (83.5 kg)  02/06/17 185 lb (83.9 kg)  01/24/17 184 lb (83.5 kg)     Vital signs reviewed  05/18/2023  - Note at rest 02 sats  94% on 3lpm vs 82% on arrival   > repeated RA p tried 3lpm and back to 86%  p 2 min  General appearance:    extremely pale amb wm nad at rest    HEENT : Oropharynx  clear       NECK :  without  apparent JVD/ palpable Nodes/TM    LUNGS: no acc muscle use,  Nl contour chest which is clear to A and P bilaterally without cough on insp or exp maneuvers   CV:  RRR  no s3  1-2/6 sem s   increase in P2, and no edema   ABD:  soft and nontender  with nl inspiratory excursion in the supine position. No bruits or organomegaly appreciated   MS:  Nl gait/ ext warm without deformities Or obvious joint restrictions  calf tenderness, cyanosis or clubbing    SKIN: warm and dry without lesions    NEURO:  alert, approp, nl sensorium with  no motor or cerebellar deficits apparent.          CXR PA and Lateral:   05/18/2023 :    I  personally reviewed images and impression is as follows:     CM with non-specific lung markings      Assessment:

## 2023-05-18 NOTE — Assessment & Plan Note (Signed)
W/u  in progess         Each maintenance medication was reviewed in detail including emphasizing most importantly the difference between maintenance and prns and under what circumstances the prns are to be triggered using an action plan format where appropriate.  Total time for H and P, chart review, counseling,   and generating customized AVS unique to this office visit / same day charting  > 30 min acute eval

## 2023-05-18 NOTE — ED Provider Notes (Addendum)
[place] EMERGENCY DEPARTMENT AT [facility] Provider Note   CSN: [id number] Arrival date & time: 05/18/23  1201     History  Chief Complaint  Patient presents with   Shortness of Breath    Christian Woodard is a 77 y.o. male.  Patient sent in from pulmonary office for hypoxia.  Patient does not use oxygen at home.  Patient's primary care doctor is Dr. Ouida Sills.  He also was seen by Dr. Work today.  He is requiring 3 L of oxygen to have sats in the low 90s.  Patient also very pale.  And seen by Dr. Jena Gauss as part of a workup for anemia of unknown source.  No evidence of any significant GI bleed.  Just swallowed the capsule recently.  Patient does not have COPD according to pulmonary medicine but is had dyspnea on exertion for some period of time now could possibly be sequela of remote COVID-19 infection.  Patient on room air there was satting in the 80s.  And noted to be significantly pale.  Past medical history otherwise significant for hypercholesteremia arthritis diabetes Barrett's esophagus gastroesophageal reflux disease.  Patient is a former smoker and quit in 2000.  No chest pain.  Patient really without any symptoms at rest.       Home Medications Prior to Admission medications   Medication Sig Start Date End Date Taking? Authorizing Provider  acetaminophen (TYLENOL) 500 MG tablet Take 1,000 mg by mouth every 6 (six) hours as needed (headaches/pain.).    [provider]  aspirin EC 81 MG tablet Take 81 mg by mouth every evening. Swallow whole.    [provider]  atorvastatin (LIPITOR) 10 MG tablet Take 10 mg by mouth at bedtime.    [provider]  chlorpheniramine (CHLOR-TRIMETON) 4 MG tablet Take 4 mg by mouth in the morning and at bedtime.    [provider]  cyanocobalamin (VITAMIN B12) 1000 MCG tablet Take 1,000 mcg by mouth daily.    [provider]  famotidine (PEPCID) 20 MG tablet Take 20 mg by mouth daily after  supper. Takes after supper    [provider]  gabapentin (NEURONTIN) 300 MG capsule Take 1 capsule (300 mg total) by mouth 3 (three) times daily. Patient taking differently: Take 300 mg by mouth 2 (two) times daily. 11/22/21   Nyoka Cowden, MD  glipiZIDE (GLUCOTROL XL) 5 MG 24 hr tablet Take 5 mg by mouth daily with breakfast.    [provider]  LANTUS SOLOSTAR 100 UNIT/ML Solostar Pen Inject 45 Units into the skin in the morning. 08/23/16   [provider]  losartan (COZAAR) 50 MG tablet Take 50 mg by mouth in the morning.    [provider]  metFORMIN (GLUCOPHAGE) 1000 MG tablet Take 1,000 mg by mouth 2 (two) times daily. 07/22/16   [provider]  NOVOLOG FLEXPEN 100 UNIT/ML FlexPen Inject 6 Units into the skin 2 (two) times daily. 02/05/23   [provider]  pantoprazole (PROTONIX) 40 MG tablet TAKE 1 TABLET BY MOUTH ONCE DAILY 30  TO  60  MINUTES  BEFORE  BREAKFAST 08/16/22   Tiffany Kocher, PA-C      Allergies    Patient has no known allergies.    Review of Systems   Review of Systems  Constitutional:  Negative for chills and fever.  HENT:  Negative for ear pain and sore throat.   Eyes:  Negative for pain and visual disturbance.  Respiratory:  Positive for shortness of breath. Negative for cough.   Cardiovascular:  Negative for chest pain and palpitations.  Gastrointestinal:  Negative for abdominal pain and vomiting.  Genitourinary:  Negative for dysuria and hematuria.  Musculoskeletal:  Negative for arthralgias and back pain.  Skin:  Negative for color change and rash.  Neurological:  Negative for seizures and syncope.  All other systems reviewed and are negative.   Physical Exam Updated Vital Signs BP 120/71 (BP Location: Right Arm)   Pulse 94   Temp 97.7 F (36.5 C) (Oral)   Resp 14   Ht 1.676 m (5\' 6" )   Wt 77.8 kg   SpO2 95%   BMI 27.68 kg/m  Physical Exam Vitals and nursing note reviewed.  Constitutional:       General: He is not in acute distress.    Appearance: Normal appearance. He is well-developed.  HENT:     Head: Normocephalic and atraumatic.  Eyes:     Extraocular Movements: Extraocular movements intact.     Conjunctiva/sclera: Conjunctivae normal.     Pupils: Pupils are equal, round, and reactive to light.  Cardiovascular:     Rate and Rhythm: Normal rate and regular rhythm.     Heart sounds: No murmur heard. Pulmonary:     Effort: Pulmonary effort is normal. No respiratory distress.     Breath sounds: No stridor. No wheezing, rhonchi or rales.  Abdominal:     General: There is no distension.     Palpations: Abdomen is soft.     Tenderness: There is no abdominal tenderness.  Musculoskeletal:        General: No swelling.     Cervical back: Normal range of motion and neck supple.  Skin:    General: Skin is warm and dry.     Capillary Refill: Capillary refill takes less than 2 seconds.     Coloration: Skin is pale.  Neurological:     General: No focal deficit present.     Mental Status: He is alert and oriented to person, place, and time.     Cranial Nerves: No cranial nerve deficit.     Sensory: No sensory deficit.     Motor: No weakness.  Psychiatric:        Mood and Affect: Mood normal.     ED Results / Procedures / Treatments   Labs (all labs ordered are listed, but only abnormal results are displayed) Labs Reviewed  CBC WITH DIFFERENTIAL/PLATELET - Abnormal; Notable for the following components:      Result Value   WBC 11.7 (*)    Hemoglobin 12.1 (*)    HCT 38.0 (*)    Platelets 464 (*)    Neutro Abs 9.3 (*)    All other components within normal limits  COMPREHENSIVE METABOLIC PANEL - Abnormal; Notable for the following components:   Glucose, Bld 68 (*)    ALT 48 (*)    Total Bilirubin 1.6 (*)    All other components within normal limits  D-DIMER, QUANTITATIVE  TYPE AND SCREEN    EKG EKG Interpretation  Date/Time:  Friday May 18 2023 12:20:03  EDT Ventricular Rate:  94 PR Interval:  177 QRS Duration: 99 QT Interval:  339 QTC Calculation: 424 R Axis:   192 Text Interpretation: Sinus rhythm Atrial premature complexes Probable left atrial enlargement RVH with secondary repolarization abnrm Anterolateral infarct, age indeterminate Confirmed by Vanetta Mulders [phone]) on 05/18/2023 12:48:29 PM  Radiology DG Chest 2 View  Result  Date: 05/18/2023 CLINICAL DATA:  SOB EXAM: CHEST - 2 VIEW COMPARISON:  CXR 01/10/21 FINDINGS: No pleural effusion. No pneumothorax. No focal airspace opacity. Unchanged cardiac and mediastinal contours. No radiographically apparent displaced rib fractures. Visualized upper abdomen is unremarkable. Vertebral body heights are maintained. IMPRESSION: No focal airspace opacity Electronically Signed   By: Lorenza Cambridge M.D.   On: 05/18/2023 12:59    Procedures Procedures    Medications Ordered in ED Medications  albuterol (VENTOLIN HFA) 108 (90 Base) MCG/ACT inhaler 2 puff (has no administration in time range)  0.9 %  sodium chloride infusion ( Intravenous New Bag/Given 05/18/23 1350)  sodium chloride 0.9 % bolus 500 mL (500 mLs Intravenous New Bag/Given 05/18/23 1349)    ED Course/ Medical Decision Making/ A&P                             Medical Decision Making Amount and/or Complexity of Data Reviewed Labs: ordered. Radiology: ordered.  Risk Prescription drug management. Decision regarding hospitalization.   Patient very pale.  Oxygen saturations on 3 L is in the low 90s.  Patient's blood pressure little soft systolics now kind of in the 90s.  Arrived at 120/71.  No fever.  Will give a little bit of fluid chest x-ray was negative.  Lab results are pending.  Because of the hypoxia patient will require admission but we do need labs.  Patient also typed and screened.  Based on his history of anemia and is pale as he is appearing he may require blood transfusion as well.  But patient will require  admission.  CRITICAL CARE Performed by: Vanetta Mulders Total critical care time: 40 minutes Critical care time was exclusive of separately billable procedures and treating other patients. Critical care was necessary to treat or prevent imminent or life-threatening deterioration. Critical care was time spent personally by me on the following activities: development of treatment plan with patient and/or surrogate as well as nursing, discussions with consultants, evaluation of patient's response to treatment, examination of patient, obtaining history from patient or surrogate, ordering and performing treatments and interventions, ordering and review of laboratory studies, ordering and review of radiographic studies, pulse oximetry and re-evaluation of patient's condition.  Discussion with hospitalist CT scan from June 2023 does raise some question of pulmonary hypertension may be some fibrosis.  They will admit for the hypoxia.  Patient's D-dimer not elevated here.  Lungs are clear no wheezing.  Does have an oxygen requirement.   Final Clinical Impression(s) / ED Diagnoses Final diagnoses:  Hypoxia  SOB (shortness of breath)    Rx / DC Orders ED Discharge Orders     None         Vanetta Mulders, MD 05/18/23 1339    Vanetta Mulders, MD 05/18/23 1339    Vanetta Mulders, MD 05/18/23 1544

## 2023-05-18 NOTE — ED Notes (Signed)
Patient O2 increased to 5 lpm via [place] due to desat 87% on 4 lpm and no increase with deep breaths.

## 2023-05-18 NOTE — Patient Instructions (Addendum)
You will need to ER for evaluation of new respiratory failure that may be exacerbated by anemia as has been the case in the past.  Please schedule a follow up office visit in 6 weeks, call sooner if needed

## 2023-05-18 NOTE — ED Triage Notes (Signed)
Pt sent from [facility] after office visit this morning. Pt arrived with new O2 requirement and baseline saturation low 80s on room air. Noted to be extremely pale and easily winded on arrival. Pt has prior hx of similar presentation r/t profound anemia requiring multiple blood transfusions and states this feels the same.

## 2023-05-18 NOTE — H&P (Signed)
History and Physical    SAYLOR SHECKLER WUJ:[id number] DOB: 1946/08/13 DOA: 05/18/2023  PCP: Carylon Perches, MD (Confirm with patient/family/NH records and if not entered, this has to be entered at [facility] point of entry) Patient coming from: Home  I have personally briefly reviewed patient's old medical records in [facility]  Chief Complaint: Cough, SOB  HPI: Christian Woodard is a 77 y.o. male with medical history significant of HTN, IDDM with insulin resistance, Barrett's esophagus, chronic iron deficiency anemia, COPD Gold stage I, pulmonary fibrosis, question of pulmonary hypertension, sent from pulmonology office for evaluation of worsening of shortness of breath.  Symptoms started about 4 months ago, at baseline, patient has had some exertional dyspnea since he contracted COVID in 2020, which he attributed to " COVID lung scarring".  He lives on the smoking 1 and used to be able to climb the hill back-and-forth with no significant breathing problems however about 4 weeks ago he started to develop shortness of breath climbing the small hill, gradually getting worse.  He also developed a dry cough about the same time but denies any fever chills or chest pains or leg swelling.  Denies any significant weight changes, no recent medication changes.  No long distance travels.  Today he went to see pulmonologist who did x-ray in the office and found no acute infiltrates but patient was found to be hypoxic O2 saturation in the lower 80s send pulmonary signed him to ED.  Patient has a chronic Barrett's esophagus underwent biopsy at [facility] in January 2024 and 2 weeks ago he went to see GI for follow-up, blood work showed hemoglobin dropped to 12.5 compared to baseline of more than 14.  And blood work showed iron deficiency anemia and patient was started on iron supplement.  He underwent capsule endoscopy this past Tuesday, and the result is pending.  ED Course: Afebrile, pulse 94, blood pressure 120/71, O2  sat ration 86% on room air and stabilized on 3 L.  Chest x-ray showed mild pulmonary congestion.  Blood work showed hemoglobin 12.1, creatinine 1.1 glucose 68 bicarb 23.  D-dimer negative.  Review of Systems: As per HPI otherwise 14 point review of systems negative.    Past Medical History:  Diagnosis Date   Arthritis    Barrett's esophagus    With low-grade dysplasia, following with [facility]   Diabetes mellitus    GERD (gastroesophageal reflux disease)    Hypercholesteremia     Past Surgical History:  Procedure Laterality Date   BACK SURGERY  2000   CHOLECYSTECTOMY     COLONOSCOPY  09/15/2011   Dr. Jena Gauss: Suboptimal prep. Pancolonic diverticulosis. Tubular adenomas. Next colonoscopy 5 years.   COLONOSCOPY N/A 09/22/2016   Surgeon: Corbin Ade, MD; pancolonic diverticulosis, otherwise normal exam.  Recommended repeat in 5 years.   COLONOSCOPY WITH PROPOFOL N/A 09/29/2021   Procedure: COLONOSCOPY WITH PROPOFOL;  Surgeon: Corbin Ade, MD;  [facility]: [facility];  Service: Endoscopy;  Laterality: N/A;  9:00am   ESOPHAGOGASTRODUODENOSCOPY N/A 08/31/2017   Surgeon: Corbin Ade, MD;  Barrett's esophagus with low-grade dysplasia, small hiatal hernia with abnormal gastric mucosa biopsied (no H. pylori), normal examined duodenum.     ESOPHAGOGASTRODUODENOSCOPY  11/2018   [facility]; Barrett's esophagus, negative for dysplasia   ESOPHAGOGASTRODUODENOSCOPY  01/2020   [facility]; Barrett's esophagus, indefinite for focal low-grade dysplasia.   GIVENS CAPSULE STUDY N/A 10/26/2017   Surgeon: Corbin Ade, MD; localized clustered areas of white "blunted appearing" abnormal small bowel  mucosa/villi of uncertain significance.  Follow-up CTE and small bowel enteroscopy at [facility] without significant findings.    HERNIA REPAIR     as a child   left cornea transplant Left    POLYPECTOMY  09/29/2021   Procedure: POLYPECTOMY;  Surgeon: Corbin Ade, MD;  Location: [facility];  Service: Endoscopy;;    Right cornea transplant     SMALL BOWEL ENTEROSCOPY  11/2017   [facility]; Barrett's esophagus focally positive for low-grade dysplasia, normal stomach, normal duodenum, normal jejunum.  Small bowel biopsies were benign.   TONSILLECTOMY       reports that he quit smoking about 24 years ago. His smoking use included cigarettes. He has a 72.00 pack-year smoking history. He has never used smokeless tobacco. He reports that he does not drink alcohol and does not use drugs.  No Known Allergies  Family History  Problem Relation Age of Onset   Heart disease Mother    Heart disease Father    Emphysema Father        smoked   Lung cancer Father        smoked   Colon cancer Neg Hx     Prior to Admission medications   Medication Sig Start Date End Date Taking? Authorizing Provider  acetaminophen (TYLENOL) 500 MG tablet Take 1,000 mg by mouth every 6 (six) hours as needed (headaches/pain.).   Yes [provider]  aspirin EC 81 MG tablet Take 81 mg by mouth every evening. Swallow whole.   Yes [provider]  atorvastatin (LIPITOR) 10 MG tablet Take 10 mg by mouth at bedtime.   Yes [provider]  chlorpheniramine (CHLOR-TRIMETON) 4 MG tablet Take 4 mg by mouth in the morning and at bedtime.   Yes [provider]  cyanocobalamin (VITAMIN B12) 1000 MCG tablet Take 1,000 mcg by mouth daily.   Yes [provider]  famotidine (PEPCID) 20 MG tablet Take 20 mg by mouth daily after supper. Takes after supper   Yes [provider]  Ferrous Gluconate (IRON 27 PO) Take 1 tablet by mouth daily.   Yes [provider]  gabapentin (NEURONTIN) 300 MG capsule Take 1 capsule (300 mg total) by mouth 3 (three) times daily. Patient taking differently: Take 300 mg by mouth 2 (two) times daily. 11/22/21  Yes Nyoka Cowden, MD  glipiZIDE (GLUCOTROL XL) 5 MG 24 hr tablet Take 5 mg by mouth daily with breakfast.   Yes [provider]  LANTUS SOLOSTAR 100  UNIT/ML Solostar Pen Inject 45 Units into the skin in the morning. 08/23/16  Yes [provider]  losartan (COZAAR) 50 MG tablet Take 50 mg by mouth in the morning.   Yes [provider]  metFORMIN (GLUCOPHAGE) 1000 MG tablet Take 1,000 mg by mouth 2 (two) times daily. 07/22/16  Yes [provider]  NOVOLOG FLEXPEN 100 UNIT/ML FlexPen Inject 6 Units into the skin 2 (two) times daily. 02/05/23  Yes [provider]  pantoprazole (PROTONIX) 40 MG tablet TAKE 1 TABLET BY MOUTH ONCE DAILY 30  TO  60  MINUTES  BEFORE  BREAKFAST 08/16/22  Yes Tiffany Kocher, PA-C    Physical Exam: Vitals:   05/18/23 1216 05/18/23 1217  BP: 120/71   Pulse: 94   Resp: 14   Temp: 97.7 F (36.5 C)   TempSrc: Oral   SpO2: 95%   Weight:  77.8 kg  Height:  5\' 6"  (1.676 m)    Constitutional: NAD, calm,  comfortable Vitals:   05/18/23 1216 05/18/23 1217  BP: 120/71   Pulse: 94   Resp: 14   Temp: 97.7 F (36.5 C)   TempSrc: Oral   SpO2: 95%   Weight:  77.8 kg  Height:  5\' 6"  (1.676 m)   Eyes: PERRL, lids and conjunctivae normal ENMT: Mucous membranes are moist. Posterior pharynx clear of any exudate or lesions.Normal dentition.  Neck: normal, supple, no masses, no thyromegaly Respiratory: clear to auscultation bilaterally, no wheezing, no crackles. Normal respiratory effort. No accessory muscle use.  Cardiovascular: Regular rate and rhythm, no murmurs / rubs / gallops. No extremity edema. 2+ pedal pulses. No carotid bruits.  Abdomen: no tenderness, no masses palpated. No hepatosplenomegaly. Bowel sounds positive.  Musculoskeletal: no clubbing / cyanosis. No joint deformity upper and lower extremities. Good ROM, no contractures. Normal muscle tone.  Skin: no rashes, lesions, ulcers. No induration Neurologic: CN 2-12 grossly intact. Sensation intact, DTR normal. Strength 5/5 in all 4.  Psychiatric: Normal judgment and insight. Alert and oriented x 3. Normal mood.     Labs  on Admission: I have personally reviewed following labs and imaging studies  CBC: Recent Labs  Lab 05/18/23 1340  WBC 11.7*  NEUTROABS 9.3*  HGB 12.1*  HCT 38.0*  MCV 86.4  PLT 464*   Basic Metabolic Panel: Recent Labs  Lab 05/18/23 1340  NA 138  K 4.1  CL 106  CO2 23  GLUCOSE 68*  BUN 14  CREATININE 1.10  CALCIUM 9.2   GFR: Estimated Creatinine Clearance: 55.2 mL/min (by C-G formula based on SCr of 1.1 mg/dL). Liver Function Tests: Recent Labs  Lab 05/18/23 1340  AST 32  ALT 48*  ALKPHOS 80  BILITOT 1.6*  PROT 7.5  ALBUMIN 3.9   No results for input(s): "LIPASE", "AMYLASE" in the last 168 hours. No results for input(s): "AMMONIA" in the last 168 hours. Coagulation Profile: No results for input(s): "INR", "PROTIME" in the last 168 hours. Cardiac Enzymes: No results for input(s): "CKTOTAL", "CKMB", "CKMBINDEX", "TROPONINI" in the last 168 hours. BNP (last 3 results) No results for input(s): "PROBNP" in the last 8760 hours. HbA1C: No results for input(s): "HGBA1C" in the last 72 hours. CBG: No results for input(s): "GLUCAP" in the last 168 hours. Lipid Profile: No results for input(s): "CHOL", "HDL", "LDLCALC", "TRIG", "CHOLHDL", "LDLDIRECT" in the last 72 hours. Thyroid Function Tests: No results for input(s): "TSH", "T4TOTAL", "FREET4", "T3FREE", "THYROIDAB" in the last 72 hours. Anemia Panel: No results for input(s): "VITAMINB12", "FOLATE", "FERRITIN", "TIBC", "IRON", "RETICCTPCT" in the last 72 hours. Urine analysis:    Component Value Date/Time   COLORURINE YELLOW 10/03/2019 1307   APPEARANCEUR HAZY (A) 10/03/2019 1307   LABSPEC 1.019 10/03/2019 1307   PHURINE 5.0 10/03/2019 1307   GLUCOSEU NEGATIVE 10/03/2019 1307   HGBUR NEGATIVE 10/03/2019 1307   BILIRUBINUR NEGATIVE 10/03/2019 1307   KETONESUR 5 (A) 10/03/2019 1307   PROTEINUR NEGATIVE 10/03/2019 1307   NITRITE NEGATIVE 10/03/2019 1307   LEUKOCYTESUR NEGATIVE 10/03/2019 1307     Radiological Exams on Admission: DG Chest 2 View  Result Date: 05/18/2023 CLINICAL DATA:  SOB EXAM: CHEST - 2 VIEW COMPARISON:  CXR 01/10/21 FINDINGS: No pleural effusion. No pneumothorax. No focal airspace opacity. Unchanged cardiac and mediastinal contours. No radiographically apparent displaced rib fractures. Visualized upper abdomen is unremarkable. Vertebral body heights are maintained. IMPRESSION: No focal airspace opacity Electronically Signed   By: Lorenza Cambridge M.D.   On: 05/18/2023 12:59    EKG:  Independently reviewed.  RVH with right axis deviation secondary ST changes.    Assessment/Plan Principal Problem:   Hypoxia Active Problems:   Acute respiratory failure with hypoxia (HCC)   Type 2 diabetes mellitus without complication (HCC)   Essential hypertension   Iron deficiency anemia   Dysphagia  (please populate well all problems here in Problem List. (For example, if patient is on BP meds at home and you resume or decide to hold them, it is a problem that needs to be her. Same for CAD, COPD, HLD and so on)  Acute/subacute hypoxic respiratory failure -Differential is wide at this point. Review patient's Hx and the past pulmonary and cardiac study, there was a high resolution CT chest last year showed moderate centrilobular emphysema, and mild pulmonary fibrosis.  Same CT also showed evidence of pulmonary hypertension.  His most recent echocardiogram was in 2022 which showed grade 1 diastolic dysfunction as well as small pericardial effusion.  RV function appears to be normal with no reported evidence of pulmonary hypertension. -D-dimer negative, low suspicion for PE. -Clinically suspect acute/subacute hypoxia is from multifactorial etiology including pulmonary conditions of untreated stage I COPD, probably worsening fibrosis and/or pulmonary hypertension neither was formally diagnosed. -X-ray showed no acute infiltrates but appears to have significant pulmonary vascular  congestion, 1 dose of 20 mg IV Lasix ordered.  Follow-up echocardiogram -For stage I COPD, patient does have a new onset of cough, but no significant wheezing or crackles on pulmonary exam, will initiate ICS of Pulmicort -ABG ordered -May need Home O2 evaluation.  Check ambulatory pulse ox -Other Ddx, EKG showed some new T wave inversions on V1-3 compared to old EKGs, one time Trop ordered. Will repeat EKG in AM.  COPD Gold stage I -Start Pulmicort  History of chronic HFpEF -1 dose of IV Lasix ordered, recheck checks x-ray tomorrow  HTN -Controlled, continue losartan  IDDM with hypoglycemia -Patient is asymptomatic but denies any syncope or near syncope -Will hold off long-acting insulin -Hold off Amaryl -SSI for now and continue metformin  Chronic iron deficiency anemia -H&H dropped recently but remained stable compared to 2 weeks ago. -Has been following with [facility] without recent outpatient capsule endoscopy done this week and result pending. -Continue iron supplement  GERD -Continue PPI  DVT prophylaxis: Lovenox Code Status: Full code Family Communication: Wife at bedside Disposition Plan: Expect less than 2 midnight hospital stay Consults called: None Admission status: Medsurg Obs   Emeline General MD [facility] Pager [phone]  05/18/2023, 4:04 PM

## 2023-05-18 NOTE — Assessment & Plan Note (Signed)
Onset  2013  Spirometry 06/14/2017  FEV1 1.91 (70%)  Ratio 73 mild curvature  -  06/14/2017  Walked RA x 3 laps @ 185 ft each stopped due to  End of study, sats 89% and sob @ fast pace  - Allergy profile 06/14/17 >  Eos 0.1/  IgE    38 neg RAST  - PFT's  08/20/2017  FEV1 2.23 (84 % ) ratio 76  p 2 % improvement from saba p nothing prior to study with DLCO  45/55c % corrects to 78 % for alv volume   - Flared with COVID19 pna 09/2019  -  01/19/2021   Walked RA  approx   300 ft  @ fast pace  stopped due to  sats 86% min sob    - HRCT 01/21/2021 1. There is minimal irregular peripheral interstitial opacity most notable at the lung bases and involving non dependent portions of the right middle lobe and lingula. Findings suggest minimal fibrosis 2 Mild, lobular air trapping on expiratory phase imaging, suggestive of small airways disease. 3. Emphysema. 6. Coronary artery disease -  PFTs 01/31/21 no change vs priors with mild concavity  -  02/18/2021   Walked RA  approx   600 ft  @ moderate pace  stopped due to  End of study,  Min sob and sats 90% at end with hgb 12.7    - Echo 10/05/2021  G1 diastolic dysfunction only, nl LA/RA - 08/18/2021   Walked on RA x  3  lap(s) =  approx 477ft @ mod to fast pace, stopped due to end of study with mild sob and   with lowest 02 sats 90%   - 05/18/2023 Acute eval for sob x 4 weeks with sats 86% RA p rest x 5 min > to ER   Hypoxemia unexplained at this point but should not occur from anemia unless v/q or diffusion defect or small shunt somewhere so need expedient w/u > carried to ER in w/c

## 2023-05-19 ENCOUNTER — Observation Stay (HOSPITAL_BASED_OUTPATIENT_CLINIC_OR_DEPARTMENT_OTHER): Payer: Medicare HMO

## 2023-05-19 DIAGNOSIS — Z794 Long term (current) use of insulin: Secondary | ICD-10-CM

## 2023-05-19 DIAGNOSIS — I10 Essential (primary) hypertension: Secondary | ICD-10-CM | POA: Diagnosis not present

## 2023-05-19 DIAGNOSIS — E119 Type 2 diabetes mellitus without complications: Secondary | ICD-10-CM | POA: Diagnosis not present

## 2023-05-19 DIAGNOSIS — D509 Iron deficiency anemia, unspecified: Secondary | ICD-10-CM

## 2023-05-19 DIAGNOSIS — R1319 Other dysphagia: Secondary | ICD-10-CM | POA: Diagnosis not present

## 2023-05-19 DIAGNOSIS — J9601 Acute respiratory failure with hypoxia: Secondary | ICD-10-CM | POA: Diagnosis not present

## 2023-05-19 DIAGNOSIS — I5033 Acute on chronic diastolic (congestive) heart failure: Secondary | ICD-10-CM

## 2023-05-19 LAB — ECHOCARDIOGRAM COMPLETE
Height: 66 in
S' Lateral: 2.3 cm
Weight: 2684.32 oz

## 2023-05-19 LAB — GLUCOSE, CAPILLARY
Glucose-Capillary: 66 mg/dL — ABNORMAL LOW (ref 70–99)
Glucose-Capillary: 92 mg/dL (ref 70–99)
Glucose-Capillary: 82 mg/dL (ref 70–99)

## 2023-05-19 MED ORDER — COMBIVENT RESPIMAT 20-100 MCG/ACT IN AERS: 1.0000 | INHALATION_SPRAY | Freq: Four times a day (QID) | RESPIRATORY_TRACT | 2 refills | Status: DC | PRN

## 2023-05-19 MED ORDER — PERFLUTREN LIPID MICROSPHERE
1.0000 mL | INTRAVENOUS | Status: DC | PRN
  Administered 2023-05-19: 2 mL via INTRAVENOUS

## 2023-05-19 MED ORDER — DM-GUAIFENESIN ER 30-600 MG PO TB12: 1.0000 | ORAL_TABLET | Freq: Two times a day (BID) | ORAL | 0 refills | Status: AC

## 2023-05-19 MED ORDER — POTASSIUM CHLORIDE CRYS ER 20 MEQ PO TBCR: 20.0000 meq | EXTENDED_RELEASE_TABLET | Freq: Every day | ORAL | 1 refills | Status: DC

## 2023-05-19 MED ORDER — PREDNISONE 20 MG PO TABS: ORAL_TABLET | ORAL | 0 refills | Status: DC

## 2023-05-19 MED ORDER — BUDESONIDE-FORMOTEROL FUMARATE 160-4.5 MCG/ACT IN AERO: 2.0000 | INHALATION_SPRAY | Freq: Two times a day (BID) | RESPIRATORY_TRACT | 3 refills | Status: DC

## 2023-05-19 MED ORDER — PANTOPRAZOLE SODIUM 40 MG PO TBEC: 40.0000 mg | DELAYED_RELEASE_TABLET | Freq: Two times a day (BID) | ORAL | 2 refills | Status: DC

## 2023-05-19 MED ORDER — GABAPENTIN 300 MG PO CAPS: 300.0000 mg | ORAL_CAPSULE | Freq: Two times a day (BID) | ORAL | Status: DC

## 2023-05-19 MED ORDER — FUROSEMIDE 20 MG PO TABS: 20.0000 mg | ORAL_TABLET | Freq: Every day | ORAL | 2 refills | Status: DC

## 2023-05-19 NOTE — Care Management Obs Status (Signed)
MEDICARE OBSERVATION STATUS NOTIFICATION   Patient Details  Name: Christian Woodard MRN: [id number] Date of Birth: 1946/11/15   Medicare Observation Status Notification Given:  Yes    Samadhi Mahurin Marsh Dolly, LCSW 05/19/2023, 1:42 PM

## 2023-05-19 NOTE — Progress Notes (Signed)
SATURATION QUALIFICATIONS: (This note is used to comply with regulatory documentation for home oxygen)  Patient Saturations on Room Air at Rest = 90%  Patient Saturations on Room Air while Ambulating = 87%  Patient Saturations on 4 Liters of oxygen while Ambulating = 96%  Please briefly explain why patient needs home oxygen: Patient's o2 saturation decreases to 87% on RA with ambulation.

## 2023-05-19 NOTE — Progress Notes (Signed)
   05/19/23 1221  ReDS Vest / Clip  Station Marker D  Ruler Value 35  ReDS Value Range (!) > 40  ReDS Actual Value 41   MD made aware

## 2023-05-19 NOTE — Discharge Summary (Addendum)
Physician Discharge Summary   Patient: Christian Woodard MRN: [id number] DOB: [birth year]  Admit date:     05/18/2023  Discharge date: 05/19/23  Discharge Physician: Vassie Loll   PCP: Carylon Perches, MD   Recommendations at discharge:  Repeat basic metabolic panel to follow electrolytes and renal function. Make sure patient follow-up with pulmonology service for PFTs and further adjustment into maintenance therapy for underlying COPD/pulmonary hypertension and pulmonary fibrosis. Outpatient follow-up with cardiology service recommended. Continue close follow-up outpatient CBGs/A1c with further adjustment to hypoglycemic regimen as needed.  Discharge Diagnoses: Acute respiratory failure with hypoxia Active Problems:   Acute respiratory failure with hypoxia (HCC)   Type 2 diabetes mellitus without complication (HCC)   Essential hypertension   Iron deficiency anemia   Dysphagia  Brief Hospital admission narrative course: As per H&P written by Dr.Zhang on 05/19/23 Christian Woodard is a 77 y.o. male with medical history significant of HTN, IDDM with insulin resistance, Barrett's esophagus, chronic iron deficiency anemia, COPD Gold stage I, pulmonary fibrosis, question of pulmonary hypertension, sent from pulmonology office for evaluation of worsening of shortness of breath.   Symptoms started about 4 months ago, at baseline, patient has had some exertional dyspnea since he contracted COVID in 2020, which he attributed to " COVID lung scarring".  He lives on the smoking 1 and used to be able to climb the hill back-and-forth with no significant breathing problems however about 4 weeks ago he started to develop shortness of breath climbing the small hill, gradually getting worse.  He also developed a dry cough about the same time but denies any fever chills or chest pains or leg swelling.  Denies any significant weight changes, no recent medication changes.  No long distance travels.  Today he went  to see pulmonologist who did x-ray in the office and found no acute infiltrates but patient was found to be hypoxic O2 saturation in the lower 80s send pulmonary signed him to ED.  Patient has a chronic Barrett's esophagus underwent biopsy at [facility] in January 2024 and 2 weeks ago he went to see GI for follow-up, blood work showed hemoglobin dropped to 12.5 compared to baseline of more than 14.  And blood work showed iron deficiency anemia and patient was started on iron supplement.  He underwent capsule endoscopy this past Tuesday, and the result is pending.   ED Course: Afebrile, pulse 94, blood pressure 120/71, O2 sat ration 86% on room air and stabilized on 3 L.  Chest x-ray showed mild pulmonary congestion.  Blood work showed hemoglobin 12.1, creatinine 1.1 glucose 68 bicarb 23.  D-dimer negative.  Assessment and Plan: Acute/subacute hypoxic resp failure -Multifactorial in the setting of acute on chronic diastolic heart failure, COPD exacerbation and pulmonary fibrosis. -Negative D-dimer -No acute infiltrates -Continue treatment with Symbicort, Combivent, flutter valve use, oxygen supplementation, steroids tapering and daily Lasix -Low-sodium diet, daily weights and outpatient follow-up with cardiology service recommended.  COPD Gold stage I -Continue as needed bronchodilator management using Combivent -Symbicort has been started -Continue follow-up with pulmonology service -Steroids tapering has been provided -Patient discharged on oxygen supplementation 4 L nasal cannula specially with exertion.  Hypertension -Well-controlled -Continue current antihypertensive agents.  Acute on chronic diastolic heart failure -Mild volume buildup with vascular congestion appreciated on x-ray -2D echo with ejection fraction 65 to 70%, no wall motion abnormalities and indeterminant diastolic filling due to E-A fusion; right atrial pressure of 8 mmHg appreciated and right ventricle mildly enlarged with  presence  of pulmonary hypertension. -Continue outpatient follow-up with cardiology service. -Daily weights, low-sodium diet and daily Lasix has been initiated.  Gastroesophageal flux disease -Continue PPI and H2 blocker  Chronic iron deficiency anemia -Stable hemoglobin -Continue patient follow-up with GI service -Continue iron supplementation.  Type 2 diabetes mellitus -Resume home hypoglycemic regimen -Modified carbohydrate diet discussed with patient. -Continue to follow CBG fluctuation and further adjust management as needed.  Consultants: None Procedures performed: See below for x-ray reports Disposition: Home with home health services. Diet recommendation: Heart healthy/low-sodium diet and modified carbohydrates.  DISCHARGE MEDICATION: Allergies as of 05/19/2023   No Known Allergies      Medication List     TAKE these medications    acetaminophen 500 MG tablet Commonly known as: TYLENOL Take 1,000 mg by mouth every 6 (six) hours as needed (headaches/pain.).   aspirin EC 81 MG tablet Take 81 mg by mouth every evening. Swallow whole.   atorvastatin 10 MG tablet Commonly known as: LIPITOR Take 10 mg by mouth at bedtime.   budesonide-formoterol 160-4.5 MCG/ACT inhaler Commonly known as: Symbicort Inhale 2 puffs into the lungs in the morning and at bedtime.   chlorpheniramine 4 MG tablet Commonly known as: CHLOR-TRIMETON Take 4 mg by mouth in the morning and at bedtime.   Combivent Respimat 20-100 MCG/ACT Aers respimat Generic drug: Ipratropium-Albuterol Inhale 1 puff into the lungs every 6 (six) hours as needed for wheezing or shortness of breath.   cyanocobalamin 1000 MCG tablet Commonly known as: VITAMIN B12 Take 1,000 mcg by mouth daily.   dextromethorphan-guaiFENesin 30-600 MG 12hr tablet Commonly known as: MUCINEX DM Take 1 tablet by mouth 2 (two) times daily for 10 days.   famotidine 20 MG tablet Commonly known as: PEPCID Take 20 mg by mouth  daily after supper. Takes after supper   furosemide 20 MG tablet Commonly known as: Lasix Take 1 tablet (20 mg total) by mouth daily.   gabapentin 300 MG capsule Commonly known as: Neurontin Take 1 capsule (300 mg total) by mouth 2 (two) times daily.   glipiZIDE 5 MG 24 hr tablet Commonly known as: GLUCOTROL XL Take 5 mg by mouth daily with breakfast.   IRON 27 PO Take 1 tablet by mouth daily.   Lantus SoloStar 100 UNIT/ML Solostar Pen Generic drug: insulin glargine Inject 45 Units into the skin in the morning.   losartan 50 MG tablet Commonly known as: COZAAR Take 50 mg by mouth in the morning.   metFORMIN 1000 MG tablet Commonly known as: GLUCOPHAGE Take 1,000 mg by mouth 2 (two) times daily.   NovoLOG FlexPen 100 UNIT/ML FlexPen Generic drug: insulin aspart Inject 6 Units into the skin 2 (two) times daily.   pantoprazole 40 MG tablet Commonly known as: PROTONIX Take 1 tablet (40 mg total) by mouth 2 (two) times daily. TAKE 1 TABLET BY MOUTH ONCE DAILY 30  TO  60  MINUTES  BEFORE  BREAKFAST What changed:  how much to take how to take this when to take this   potassium chloride SA 20 MEQ tablet Commonly known as: KLOR-CON M Take 1 tablet (20 mEq total) by mouth daily.   predniSONE 20 MG tablet Commonly known as: DELTASONE Take 3 tablets by mouth daily x 1 day; then 2 tablet by mouth daily x 2 days; then 1 tablet by mouth daily x 3 days; then half tablet by mouth daily x 3 days and stop prednisone.  Durable Medical Equipment  (From admission, onward)           Start     Ordered   05/19/23 1330  For home use only DME oxygen  Once       Question Answer Comment  Length of Need 12 Months   Mode or (Route) Nasal cannula   Liters per Minute 4   Frequency Continuous (stationary and portable oxygen unit needed)   Oxygen conserving device Yes   Oxygen delivery system Gas      05/19/23 1329            Follow-up Information     [facility],  [facility] Follow up.   Contact information: [address] [place] [address] [phone]         Carylon Perches, MD. Schedule an appointment as soon as possible for a visit in 2 week(s).   Specialty: Internal Medicine Contact information: [address] [place] [address] [phone]                Discharge Exam: Ceasar Mons Weights   05/18/23 1217 05/19/23 0500  Weight: 77.8 kg 76.1 kg   General exam: Alert, awake, oriented x 3; feeling better and breathing easier at discharge; good saturation on 4 L.  Speaking in full sentences. Respiratory system: Decreased breath sounds at the bases; positive Velcro sign on auscultation from fibrosis and very little expiratory wheezing; no using accessory muscle.  Demonstrated improved air movement in comparison to admission physical exam report. Cardiovascular system:RRR. No rubs or gallops; no JVD. Gastrointestinal system: Abdomen is nondistended, soft and nontender. No organomegaly or masses felt. Normal bowel sounds heard. Central nervous system: Alert and oriented. No focal neurological deficits. Extremities: No cyanosis, clubbing or edema. Skin: No petechiae. Psychiatry: Judgement and insight appear normal. Mood & affect appropriate.   Condition at discharge: Stable and improved.  The results of significant diagnostics from this hospitalization (including imaging, microbiology, ancillary and laboratory) are listed below for reference.   Imaging Studies: DG Chest 1 View  Result Date: 05/18/2023 CLINICAL DATA:  24401 CHF (congestive heart failure) (HCC) 97293 EXAM: CHEST  1 VIEW COMPARISON:  CXR 05/18/23 FINDINGS: Cardiomegaly. No pleural effusion. No pneumothorax. No focal airspace opacity. There are prominent bilateral interstitial opacities could represent pulmonary venous congestion. Radiographically apparent displaced rib fractures. Upper abdomen is unremarkable. IMPRESSION: Cardiomegaly and  pulmonary venous congestion. Electronically Signed   By: Lorenza Cambridge M.D.   On: 05/18/2023 16:43   DG Chest 2 View  Result Date: 05/18/2023 CLINICAL DATA:  SOB EXAM: CHEST - 2 VIEW COMPARISON:  CXR 01/10/21 FINDINGS: No pleural effusion. No pneumothorax. No focal airspace opacity. Unchanged cardiac and mediastinal contours. No radiographically apparent displaced rib fractures. Visualized upper abdomen is unremarkable. Vertebral body heights are maintained. IMPRESSION: No focal airspace opacity Electronically Signed   By: Lorenza Cambridge M.D.   On: 05/18/2023 12:59    Microbiology: Results for orders placed or performed during the hospital encounter of 05/18/23  SARS Coronavirus 2 by RT PCR (hospital order, performed in [facility] hospital lab) *cepheid single result test* Anterior Nasal Swab     Status: None   Collection Time: 05/18/23  6:49 PM   Specimen: Anterior Nasal Swab  Result Value Ref Range Status   SARS Coronavirus 2 by RT PCR NEGATIVE NEGATIVE Final    Comment: (NOTE) SARS-CoV-2 target nucleic acids are NOT DETECTED.  The SARS-CoV-2 RNA is generally detectable in upper and lower respiratory specimens  during the acute phase of infection. The lowest concentration of SARS-CoV-2 viral copies this assay can detect is 250 copies / mL. A negative result does not preclude SARS-CoV-2 infection and should not be used as the sole basis for treatment or other patient management decisions.  A negative result may occur with improper specimen collection / handling, submission of specimen other than nasopharyngeal swab, presence of viral mutation(s) within the areas targeted by this assay, and inadequate number of viral copies (<250 copies / mL). A negative result must be combined with clinical observations, patient history, and epidemiological information.  Fact Sheet for Patients:   RoadLapTop.co.za  Fact Sheet for Healthcare  Providers: http://kim-miller.com/  This test is not yet approved or  cleared by the [place] FDA and has been authorized for detection and/or diagnosis of SARS-CoV-2 by FDA under an Emergency Use Authorization (EUA).  This EUA will remain in effect (meaning this test can be used) for the duration of the COVID-19 declaration under Section 564(b)(1) of the Act, 21 U.S.C. section 360bbb-3(b)(1), unless the authorization is terminated or revoked sooner.  Performed at [facility], [address], [place], [place] [address]     Labs: CBC: Recent Labs  Lab 05/18/23 1340  WBC 11.7*  NEUTROABS 9.3*  HGB 12.1*  HCT 38.0*  MCV 86.4  PLT 464*   Basic Metabolic Panel: Recent Labs  Lab 05/18/23 1340  NA 138  K 4.1  CL 106  CO2 23  GLUCOSE 68*  BUN 14  CREATININE 1.10  CALCIUM 9.2   Liver Function Tests: Recent Labs  Lab 05/18/23 1340  AST 32  ALT 48*  ALKPHOS 80  BILITOT 1.6*  PROT 7.5  ALBUMIN 3.9   CBG: Recent Labs  Lab 05/18/23 1750 05/18/23 2119 05/19/23 0718 05/19/23 0754 05/19/23 1149  GLUCAP 113* 95 66* 92 82    Discharge time spent: greater than 30 minutes.  Signed: Vassie Loll, MD [facility] 05/19/2023

## 2023-05-19 NOTE — TOC Initial Note (Signed)
Transition of Care [facility]) - Initial/Assessment Note    Patient Details  Name: Christian Woodard MRN: [id number] Date of Birth: [birth year]  [facility] of [facility]) CM/SW Contact:    Catalina Gravel, LCSW Phone Number: 05/19/2023, 1:47 PM  Clinical Narrative:                 Pt sent to ER from medical appointment, New 02 requirement. CSW assessed pt with wife who was at bedside. Spouse had not preference for 02 provider.  CSW contacted [facility] and advised of LPM and pt referred.  CSW added to AVS.  MD indicates that pt ready for [facility] today and CSW spoke with Jasmine at [employer], should pt be dc today- they will visit home for setup.  No further TOC needs at this time.   Expected Discharge Plan: Home w Home Health Services Barriers to Discharge: Continued Medical Work up   Patient Goals and CMS Choice Patient states their goals for this hospitalization and ongoing recovery are:: Retrun home          Expected Discharge Plan and Services In-house Referral: Clinical Social Work   Post Acute Care Choice: Home Health Living arrangements for the past 2 months: Single Family Home                 DME Arranged: Oxygen DME Agency: [facility] Date DME Agency Contacted: 05/19/23 Time DME Agency Contacted: 1346 Representative spoke with at DME Agency: Leavy Cella            Prior Living Arrangements/Services Living arrangements for the past 2 months: Single Family Home Lives with:: Spouse Patient language and need for interpreter reviewed:: Yes Do you feel safe going back to the place where you live?: Yes      Need for Family Participation in Patient Care: Yes (Comment) Care giver support system in place?: Yes (comment)   Criminal Activity/Legal Involvement Pertinent to Current Situation/Hospitalization: No - Comment as needed  Activities of Daily Living Home Assistive Devices/Equipment: None ADL Screening (condition at time of admission) Patient's cognitive ability adequate to safely  complete daily activities?: Yes Is the patient deaf or have difficulty hearing?: No Does the patient have difficulty seeing, even when wearing glasses/contacts?: No Does the patient have difficulty concentrating, remembering, or making decisions?: No Patient able to express need for assistance with ADLs?: No Does the patient have difficulty dressing or bathing?: No Independently performs ADLs?: Yes (appropriate for developmental age) Does the patient have difficulty walking or climbing stairs?: No Weakness of Legs: None Weakness of Arms/Hands: None  Permission Sought/Granted                  Emotional Assessment Appearance:: Appears stated age Attitude/Demeanor/Rapport: Unable to Assess Affect (typically observed): Unable to Assess Orientation: : Oriented to Self, Oriented to Place, Oriented to Situation      Admission diagnosis:  SOB (shortness of breath) [R06.02] Hypoxia [R09.02] Patient Active Problem List   Diagnosis Date Noted   Hypoxia 05/18/2023   Solitary pulmonary nodule on lung CT 05/11/2022   Diastasis recti 01/10/2022   Dysphagia 06/22/2021   Barrett's esophagus with low grade dysplasia 03/18/2021   Shortness of breath 03/18/2021   Gastroesophageal reflux disease 03/18/2021   Constipation 03/18/2021   Iron deficiency anemia 03/18/2021   Upper airway cough syndrome 02/18/2021   Acute respiratory failure with hypoxia (HCC) 10/03/2019   Acute respiratory disease due to COVID-19 virus 10/03/2019   Essential hypertension 10/03/2019   Type 2 diabetes mellitus without  complication (HCC)    Heme positive stool 08/30/2017   Absolute anemia 06/15/2017   Dyspnea on exertion 06/14/2017   Abdominal bloating 09/04/2016   Hx of adenomatous colonic polyps 09/04/2016   PCP:  Carylon Perches, MD Pharmacy:   [facility] [address], [place] - [address] [place] [address] Phone: [phone] Fax: [phone]  [facility]  [facility] - [place], [place] - [address] [place] [address] Phone: [phone] Fax: [phone]     Social Determinants of Health (SDOH) Social History: SDOH Screenings   Food Insecurity: No Food Insecurity (05/18/2023)  Housing: Low Risk  (05/18/2023)  Transportation Needs: No Transportation Needs (05/18/2023)  Utilities: Not At Risk (05/18/2023)  Tobacco Use: Medium Risk (05/18/2023)   SDOH Interventions:     Readmission Risk Interventions     No data to display

## 2023-05-19 NOTE — Progress Notes (Signed)
  Echocardiogram 2D Echocardiogram has been performed.  Christian Woodard 05/19/2023, 2:58 PM

## 2023-05-21 ENCOUNTER — Encounter (HOSPITAL_COMMUNITY): Payer: Self-pay | Admitting: Internal Medicine

## 2023-05-21 ENCOUNTER — Other Ambulatory Visit: Payer: Self-pay | Admitting: Gastroenterology

## 2023-05-22 LAB — MYCOPLASMA PNEUMONIAE ANTIBODY, IGM: Mycoplasma pneumo IgM: 1090 U/mL — ABNORMAL HIGH (ref 0–769)

## 2023-05-23 DIAGNOSIS — J9611 Chronic respiratory failure with hypoxia: Secondary | ICD-10-CM | POA: Diagnosis not present

## 2023-05-23 DIAGNOSIS — I5032 Chronic diastolic (congestive) heart failure: Secondary | ICD-10-CM | POA: Diagnosis not present

## 2023-05-27 ENCOUNTER — Telehealth: Payer: Self-pay | Admitting: Gastroenterology

## 2023-05-27 NOTE — Op Note (Signed)
@  LOGO@     Small Bowel Givens Capsule Study Procedure date:  05/15/23  Referring Provider:  Roetta Sessions, MD PCP:  Dr. Carylon Perches, MD  Indication for procedure:  Recurrent IDA.  H/O IDA, off oral iron since 06/2022 with decline in Hgb and ferritin indicating recurrent IDA. Last EGD 11/2022 with Dr. Alvia Grove for follow up Barrett's esophagus with dysplasia. Esophageal changes consistent with long segment Barrett's noted. Medium sized hiatal hernia. Biopsies from 35-37cm showed Barrett's with low-grade dysplasia. Biopsies from 33-35cm with Barrett's and negative for dysplasia.He has another EGD scheduled for next month. Last colonoscopy 09/2021 with single tubular adenoma removed. History of small bowel capsule abnormality in 2018 which was fully evaluated at [facility] with enteroscopy and CTE as outline above. Celiac serologies normal in 2018.    Patient data:  Wt: 78.4 kg Ht: 5'6"  Findings: Complete study as noted by capsule reaching the cecum at 2 hours 43 minutes and 14 seconds. No active bleeding or stigmata of bleeding. No masses. At 5 minutes and 50 seconds within the stomach there was somewhat adenomatous looking mucosa but unclear significance. Capsule endoscopy not designed to evaluate stomach. Patient has upcoming EGD next month. At 9 minutes 7 seconds proximal small bowel slightly edematous in appearance vs peristalsis. Distal small bowel with lymphoid hyperplasia, for example at 2 hours 21 minutes and 58 seconds.   First Gastric image:  53 seconds First Duodenal image: 7 minutes 29 seconds First Ileo-Cecal Valve image:  2 hours 39 minutes 45 seconds First Cecal image: 2 hours 43 minutes 14 seconds Gastric Passage time: 6 minutes Small Bowel Passage time:  2 hours 35 seconds  Summary & Recommendations:  Pertinent images reviewed with Dr. Jena Gauss. Nothing found on study to explain recurrent IDA. He will be having EGD next month at [facility] for follow up Barrett's at which time stomach  will be evaluated.   We will update celiac serologies.  He should continue daily iron.  Avoid NSAIDS.  Monitor CBC, iron/tibc/ferritin.  Leanna Battles. [facility] [phone] 6/30/20248:42 PM

## 2023-05-27 NOTE — Telephone Encounter (Signed)
Tammy, please let pt know his small bowel capsule results:   Pertinent images reviewed with Dr. Jena Gauss. Nothing found on study to explain recurrent IDA. He will be having EGD next month at [facility] for follow up Barrett's at which time stomach will be evaluated.   We will update celiac serologies.  He should continue daily iron.  Avoid NSAIDS.  Monitor CBC, iron/tibc/ferritin. EGD next month as planned with Dr. Enrigue Catena.    Tammy: in 6 weeks, please arrange for CBC, ferritin, TTG IgA.

## 2023-05-28 NOTE — Telephone Encounter (Signed)
Lmom for pt to return my call.  

## 2023-05-29 NOTE — Telephone Encounter (Signed)
Lmom for pt to return my call.  

## 2023-06-04 ENCOUNTER — Other Ambulatory Visit: Payer: Self-pay

## 2023-06-04 DIAGNOSIS — Z947 Corneal transplant status: Secondary | ICD-10-CM | POA: Diagnosis not present

## 2023-06-04 DIAGNOSIS — H16223 Keratoconjunctivitis sicca, not specified as Sjogren's, bilateral: Secondary | ICD-10-CM | POA: Diagnosis not present

## 2023-06-04 DIAGNOSIS — H35033 Hypertensive retinopathy, bilateral: Secondary | ICD-10-CM | POA: Diagnosis not present

## 2023-06-04 DIAGNOSIS — I10 Essential (primary) hypertension: Secondary | ICD-10-CM | POA: Diagnosis not present

## 2023-06-04 DIAGNOSIS — D509 Iron deficiency anemia, unspecified: Secondary | ICD-10-CM

## 2023-06-04 DIAGNOSIS — E119 Type 2 diabetes mellitus without complications: Secondary | ICD-10-CM | POA: Diagnosis not present

## 2023-06-04 DIAGNOSIS — H40023 Open angle with borderline findings, high risk, bilateral: Secondary | ICD-10-CM | POA: Diagnosis not present

## 2023-06-04 DIAGNOSIS — H35373 Puckering of macula, bilateral: Secondary | ICD-10-CM | POA: Diagnosis not present

## 2023-06-04 NOTE — Telephone Encounter (Signed)
Pt was made aware and verbalized understanding. Labs were ordered and will be mailed to the pt to have completed in 6 weeks.

## 2023-06-07 DIAGNOSIS — I5032 Chronic diastolic (congestive) heart failure: Secondary | ICD-10-CM | POA: Diagnosis not present

## 2023-06-07 DIAGNOSIS — J9611 Chronic respiratory failure with hypoxia: Secondary | ICD-10-CM | POA: Diagnosis not present

## 2023-06-25 ENCOUNTER — Other Ambulatory Visit: Payer: Self-pay

## 2023-06-25 DIAGNOSIS — K089 Disorder of teeth and supporting structures, unspecified: Secondary | ICD-10-CM | POA: Diagnosis not present

## 2023-06-25 DIAGNOSIS — K22710 Barrett's esophagus with low grade dysplasia: Secondary | ICD-10-CM | POA: Diagnosis not present

## 2023-06-25 DIAGNOSIS — K2270 Barrett's esophagus without dysplasia: Secondary | ICD-10-CM | POA: Diagnosis not present

## 2023-06-25 DIAGNOSIS — E119 Type 2 diabetes mellitus without complications: Secondary | ICD-10-CM | POA: Diagnosis not present

## 2023-06-25 DIAGNOSIS — K449 Diaphragmatic hernia without obstruction or gangrene: Secondary | ICD-10-CM | POA: Diagnosis not present

## 2023-06-25 DIAGNOSIS — E785 Hyperlipidemia, unspecified: Secondary | ICD-10-CM | POA: Diagnosis not present

## 2023-06-25 DIAGNOSIS — M1990 Unspecified osteoarthritis, unspecified site: Secondary | ICD-10-CM | POA: Diagnosis not present

## 2023-06-25 DIAGNOSIS — D649 Anemia, unspecified: Secondary | ICD-10-CM | POA: Diagnosis not present

## 2023-06-25 DIAGNOSIS — Z9049 Acquired absence of other specified parts of digestive tract: Secondary | ICD-10-CM | POA: Diagnosis not present

## 2023-06-25 DIAGNOSIS — D509 Iron deficiency anemia, unspecified: Secondary | ICD-10-CM

## 2023-06-25 DIAGNOSIS — K2950 Unspecified chronic gastritis without bleeding: Secondary | ICD-10-CM | POA: Diagnosis not present

## 2023-06-28 NOTE — Progress Notes (Unsigned)
Subjective:     Patient ID: Christian Woodard, male   DOB: 1946/04/07     MRN: [id number]    Brief patient profile:  77 yowm quit smoking 2000 with mild doe @ wt 156  and did fine until around 2013 gave up pushing mower and gradually downhill since referred to pulmonary clinic 06/14/2017 by Dr   Ouida Sills   06/14/2017 1st [facility] office visit/   No resp rx  Chief Complaint  Patient presents with   Pulmonary Consult    Referred by Dr. Carylon Perches. Pt c/o SOB x 6 months, esp worse over the past 3 months. He states he gets out of breath walking an incline, such as to his mailbox. He states that he sometimes he wakes up in the night feeling SOB.  He also c/o non prod cough.   doe x 5 years,  Stops at top of incline on way back to house x 6 months  = MMRC1 = can walk nl pace, flat grade, can't hurry or go uphills or steps s sob   Never tried inhalers Worse in heat and humbidity  freq wakes with dry cough / chest tightness sits up and goes away  rec You do not have significant copd at this point and likely never will  Pantoprazole (protonix) 40 mg   Take  30-60 min before first meal of the day and Pepcid (famotidine)  20 mg one @  bedtime until return to office - this is the best way to tell whether stomach acid is contributing to your problem.   GERD diet     08/20/2017  f/u ov/ re: doe on gerd rx/ gi eval in progress for anemia/ no resp rx  Chief Complaint  Patient presents with   Follow-up    PFT's done today. Increased SOB for the past day. Chest feels heavy today.  continues to have noct spells of sob despite nl BNP and no evidence of chf on cxr or obvious anemia Tells me his hgb still trending down and GI eval in progess rec You do not have any significant copd If you continue to have breathing issues after your anemia is corrected might consider trial off ACTOS next and /or cardiology evaluation    Admission date:  10/03/2019     Discharge Date:  10/07/2019    Discharge Diagnosis  AKI (acute kidney injury) (HCC) [N17.9] COVID-19 virus infection [U07.1]     Acute respiratory failure with hypoxia (HCC)   Acute respiratory disease due to COVID-19 virus   Type 2 diabetes mellitus without complication (HCC)   Essential hypertension      10/27/19 History of Present Illness: Dyspnea:  2 laps off 02 prior to discharge / trend is improving at home but not checking sats  Cough: minimal clear  Sleeping: flat/ one pillow SABA use: none 02: none  Dex last day 10/12/2019 rec Make sure you check your oxygen saturation at your highest level of activity to be sure it stays over 90% and keep track of it at least once a week, more often if breathing getting worse, and let me know if losing ground.>>> did not do       01/19/2021  f/u ov/[place] office/ re: doe p covid  Chief Complaint  Patient presents with   Follow-up    Breathing is overall doing well and no new co's today.    Dyspnea:  Up and down the hill to the mb daily feels it getting a little  harder than it was about a month ago since feet started bothering him with paresthesias and lack of balance  Cough: none/ some p eating assoc with mild dysphagia ? Barrett's  Sleeping: flat bed / R side / 1 pillow SABA use: none  02: none Covid status: vax x 3  rec Stay as active as you can and call if losing ground with exercise tolerance  Pulmonary follow up is as directed by Dr Ouida Sills  Late add:  Based on desats with no explanation and c/o worse ex tol > HRCT/pfts next step > no specific findings/ 7 mm nodule needs f/u @ 6-12 m   08/18/2021  f/u ov/[place] office/ re: uacs maint on ppi ac and pepcid hs with Fe def anemia w/u in progress  Chief Complaint  Patient presents with   Follow-up    Pt reports that the change of weather does affect his breathing but it has improved since last visit. Pt feels that he is coughing more but has endoscopy today.   Dyspnea:  some better from mailbox  to house with hgb improving  Using pedal ex x 15 min   Cough: still an issue, dry worse when lie down Sleeping: bed is slt raised, one pilow  SABA use: none  02: none  Covid status: vax x 3 and covid  Rec Increase gabapentin to where an extra pill first in evening  then add a pill weekly until cough is gone with a  maximum 300 mg three times a day and call me with the amount that you need to eliminate the cough For drainage / throat tickle try take CHLORPHENIRAMINE  4 mg  Make sure you check your oxygen saturation at your highest level of activity to be sure it stays over 90%  Echo 10/05/2021  G1 diastolic dysfunction only, nl LA RA Please schedule a follow up visit in 3 months but call sooner if needed     11/22/2021  f/u ov/[place] office/ re: doe/uacs maint on gabapentin 400 mg per day  Chief Complaint  Patient presents with   Follow-up    Echo done 10-05-21  Cold weather is making breathing worse.   Would like flu shot today   Dyspnea: appears to correlate with hgb Cough: no more noct noct cough p h1 at hs Sleeping: perfectly flat bed, one pillow   SABA use: none  02: none  Covid status: vax x 3 and covid infection Gi w/u in progress for hgb 6 Sob also if bends over Rec Gabapentin try 300 mg twice daily and if not satisfied go ahead with 3 x daily  Don't push exercise any harder until you get your hemoglobin problem corrected Pulmonary follow up is as needed    Late add:  needs LDCT in  04/2023 due     05/18/2023  ACUTE  ov/[place] office/ re: worse sob x 4 weeks  maint on no resp rx  / no 02  Chief Complaint  Patient presents with   Acute Visit  Dyspnea:  4 weeks prior to OV  could have done mb and back uphill to house then gradually worse since and on day of ov x 20-30 ft  Cough: a little more but dry  Sleeping: flat bed one pillow  SABA use: none  02: none  Stools dark on fe rx  Rec You will need to ER for evaluation of new respiratory failure that  may be exacerbated by anemia as has been the case in the past.  Please schedule a follow up office visit in 6 weeks, call sooner if needed    Admit date:     05/18/2023  Discharge date: 05/19/23    Recommendations at discharge:  Repeat basic metabolic panel to follow electrolytes and renal function. Make sure patient follow-up with pulmonology service for PFTs and further adjustment into maintenance therapy for underlying COPD/pulmonary hypertension and pulmonary fibrosis. Outpatient follow-up with cardiology service recommended. Continue close follow-up outpatient CBGs/A1c with further adjustment to hypoglycemic regimen as needed.   Discharge Diagnoses: Acute respiratory failure with hypoxia Active Problems:   Acute respiratory failure with hypoxia (HCC)   Type 2 diabetes mellitus without complication (HCC)   Essential hypertension   Iron deficiency anemia   Dysphagia   Brief Hospital admission narrative course: As per H&P written by Dr.Zhang on 05/19/23 Christian Woodard is a 77 y.o. male with medical history significant of HTN, IDDM with insulin resistance, Barrett's esophagus, chronic iron deficiency anemia,  pulmonary fibrosis, question of pulmonary hypertension, sent from pulmonology office for evaluation of worsening of shortness of breath.   Symptoms started about 4 months ago, at baseline, patient has had some exertional dyspnea since he contracted COVID in 2020, which he attributed to " COVID lung scarring".  He lives on the smoking   and used to be able to climb the hill back-and-forth with no significant breathing problems however about 4 weeks PTA he started to develop shortness of breath climbing the small hill, gradually getting worse.  He also developed a dry cough about the same time but denies any fever chills or chest pains or leg swelling.  Denies any significant weight changes, no recent medication changes.  No long distance travels.  Today he went to see pulmonologist who  did x-ray in the office and found no acute infiltrates but patient was found to be hypoxic O2 saturation in the lower 80s send pulmonary signed him to ED.  Patient has a chronic Barrett's esophagus underwent biopsy at [facility] in January 2024 and 2 weeks ago he went to see GI for follow-up, blood work showed hemoglobin dropped to 12.5 compared to baseline of more than 14.  And blood work showed iron deficiency anemia and patient was started on iron supplement and underwent capsule endoscopy  / results pending    ED Course: Afebrile, pulse 94, blood pressure 120/71, O2 sat ration 86% on room air and stabilized on 3 L.  Chest x-ray showed mild pulmonary congestion.  Blood work showed hemoglobin 12.1, creatinine 1.1 glucose 68 bicarb 23.  D-dimer negative.   Assessment and Plan: Acute/subacute hypoxic resp failure -Multifactorial in the setting of acute on chronic diastolic heart failure,  and pulmonary fibrosis. -Negative D-dimer -No acute infiltrates -Continue treatment with Symbicort, Combivent, flutter valve use, oxygen supplementation, steroids tapering and daily Lasix -Low-sodium diet, daily weights and outpatient follow-up with cardiology service recommended.   COPD Gold 0/AB -Continue as needed bronchodilator management using Combivent -Symbicort has been started -Continue follow-up with pulmonology service -Steroids tapering has been provided -Patient discharged on oxygen supplementation 4 L nasal cannula specially with exertion.   Hypertension -Well-controlled -Continue current antihypertensive agents.   Acute on chronic diastolic heart failure -Mild volume buildup with vascular congestion appreciated on x-ray -2D echo with ejection fraction 65 to 70%, no wall motion abnormalities and indeterminant diastolic filling due to E-A fusion; right atrial pressure of 8 mmHg appreciated and right ventricle mildly enlarged with presence of pulmonary hypertension. -Continue outpatient follow-up with  cardiology service. -  Daily weights, low-sodium diet and daily Lasix has been initiated.   Gastroesophageal flux disease -Continue PPI and H2 blocker   Chronic iron deficiency anemia -Stable hemoglobin -Continue patient follow-up with GI service -Continue iron supplementation.   Type 2 diabetes mellitus -Resume home hypoglycemic regimen -Modified carbohydrate diet discussed with patient. -Continue to follow CBG fluctuation and further adjust management as needed.     06/29/2023  post hosp f/u ov/[place] office/ re: doe/ cough p covid 09/2019 maint on symbicort 160  better since admit  Chief Complaint  Patient presents with   Shortness of Breath  Dyspnea:  has not  tried mailbox yet, just room to room and not titraitng 02  Cough: no change baseline  Sleeping: bed is flat/ high pillow under head  SABA use: combivent  02: 4lpm cont since admit      No obvious day to day or daytime variability or assoc excess/ purulent sputum or mucus plugs or hemoptysis or cp or chest tightness, subjective wheeze or overt  hb symptoms.   sleeping without nocturnal  or early am exacerbation  of respiratory  c/o's or need for noct saba. Also denies any obvious fluctuation of symptoms with weather or environmental changes or other aggravating or alleviating factors except as outlined above   No unusual exposure hx or h/o childhood pna/ asthma or knowledge of premature birth.  Current Allergies, Complete Past Medical History, Past Surgical History, Family History, and Social History were reviewed in [employer] record.  ROS  The following are not active complaints unless bolded Hoarseness, sore throat, dysphagia, dental problems, itching, sneezing,  nasal congestion or discharge of excess mucus or purulent secretions, ear ache,   fever, chills, sweats, unintended wt loss or wt gain, classically pleuritic or exertional cp,  orthopnea pnd or arm/hand swelling  or leg swelling,  presyncope, palpitations, abdominal pain, anorexia, nausea, vomiting, diarrhea  or change in bowel habits or change in bladder habits, change in stools or change in urine, dysuria, hematuria,  rash, arthralgias, visual complaints, headache, numbness, weakness or ataxia or problems with walking or coordination,  change in mood or  memory.        Current Meds  Medication Sig   acetaminophen (TYLENOL) 500 MG tablet Take 1,000 mg by mouth every 6 (six) hours as needed (headaches/pain.).   aspirin EC 81 MG tablet Take 81 mg by mouth every evening. Swallow whole.   atorvastatin (LIPITOR) 10 MG tablet Take 10 mg by mouth at bedtime.   budesonide-formoterol (SYMBICORT) 160-4.5 MCG/ACT inhaler Inhale 2 puffs into the lungs in the morning and at bedtime.   chlorpheniramine (CHLOR-TRIMETON) 4 MG tablet Take 4 mg by mouth in the morning and at bedtime.   cyanocobalamin (VITAMIN B12) 1000 MCG tablet Take 1,000 mcg by mouth daily.   famotidine (PEPCID) 20 MG tablet Take 20 mg by mouth daily after supper. Takes after supper   Ferrous Gluconate (IRON 27 PO) Take 1 tablet by mouth daily.   furosemide (LASIX) 20 MG tablet Take 1 tablet (20 mg total) by mouth daily.   gabapentin (NEURONTIN) 300 MG capsule Take 1 capsule (300 mg total) by mouth 2 (two) times daily.   glipiZIDE (GLUCOTROL XL) 5 MG 24 hr tablet Take 5 mg by mouth daily with breakfast.   Ipratropium-Albuterol (COMBIVENT RESPIMAT) 20-100 MCG/ACT AERS respimat Inhale 1 puff into the lungs every 6 (six) hours as needed for wheezing or shortness of breath.   LANTUS SOLOSTAR 100 UNIT/ML Solostar Pen Inject  45 Units into the skin in the morning.   losartan (COZAAR) 50 MG tablet Take 50 mg by mouth in the morning.   metFORMIN (GLUCOPHAGE) 1000 MG tablet Take 1,000 mg by mouth 2 (two) times daily.   NOVOLOG FLEXPEN 100 UNIT/ML FlexPen Inject 6 Units into the skin 2 (two) times daily.   pantoprazole (PROTONIX) 40 MG tablet TAKE 1 TABLET BY MOUTH ONE TIME DAILY  30-60 MINUTES BEFORE BREAKFAST   potassium chloride SA (KLOR-CON M) 20 MEQ tablet Take 1 tablet (20 mEq total) by mouth daily.                Objective:   Physical Exam   Wts  06/29/2023          168  05/18/2023        171  11/22/2021      174    08/18/2021       170  02/18/2021       175 01/19/2021       173   08/20/2017      179   06/14/17 184 lb (83.5 kg)  02/06/17 185 lb (83.9 kg)  01/24/17 184 lb (83.5 kg)   Vital signs reviewed  06/29/2023  - Note at rest 02 sats  95% on 4lp cont  General appearance:    very pale chronically ill amb wm nad     HEENT : Oropharynx  clear      NECK :  without  apparent JVD/ palpable Nodes/TM    LUNGS: no acc muscle use,  Min barrel  contour chest wall with bilateral  slightly decreased bs s audible wheeze and  without cough on insp or exp maneuvers and min  Hyperresonant  to  percussion bilaterally    CV:  RRR  no s3 or 2/6 SEM s increase in P2, and no edema   ABD:  soft and nontender with pos end  insp Hoover's  in the supine position.  No bruits or organomegaly appreciated   MS:  Nl gait/ ext warm without deformities Or obvious joint restrictions  calf tenderness, cyanosis or clubbing     SKIN: warm and dry without lesions    NEURO:  alert, approp, nl sensorium with  no motor or cerebellar deficits apparent.               Lab Results  Component Value Date   HGB 12.1 (L) 05/18/2023   HGB 12.4 (L) 05/01/2023   HGB 15.3 10/24/2022   HGB 15.3 01/12/2022      Assessment:

## 2023-06-29 ENCOUNTER — Ambulatory Visit: Payer: Medicare HMO | Admitting: Internal Medicine

## 2023-06-29 ENCOUNTER — Encounter: Payer: Self-pay | Admitting: Internal Medicine

## 2023-06-29 VITALS — BP 105/65 | HR 110 | Ht 66.0 in | Wt 168.0 lb

## 2023-06-29 DIAGNOSIS — R0609 Other forms of dyspnea: Secondary | ICD-10-CM

## 2023-06-29 DIAGNOSIS — J9611 Chronic respiratory failure with hypoxia: Secondary | ICD-10-CM | POA: Diagnosis not present

## 2023-06-29 NOTE — Patient Instructions (Addendum)
Continue 4lpm at bedtime for now   Make sure you check your oxygen saturation  AT  your highest level of activity (not after you stop)   to be sure it stays over 90% and adjust  02 flow upward to maintain this level if needed but remember to turn it back to previous settings when you stop (to conserve your supply).  If still can't get saturatations above 90% walking on maximum 02 you will need to walk at a slower pace   We need to know whether you are acutally passing blood in your stools to sort out whether it is safe to continue daily aspirin.   Please schedule a follow up office visit in 4 weeks, sooner if needed  with all medications /inhalers/ solutions in hand so we can verify exactly what you are taking. This includes all medications from all doctors and over the counters

## 2023-06-30 ENCOUNTER — Encounter: Payer: Self-pay | Admitting: Internal Medicine

## 2023-06-30 ENCOUNTER — Telehealth: Payer: Self-pay | Admitting: Internal Medicine

## 2023-06-30 DIAGNOSIS — J9611 Chronic respiratory failure with hypoxia: Secondary | ICD-10-CM | POA: Insufficient documentation

## 2023-06-30 NOTE — Assessment & Plan Note (Addendum)
Onset  2013 p quit smoking in 2000  Spirometry 06/14/2017  FEV1 1.91 (70%)  Ratio 73 mild curvature  -  06/14/2017  Walked RA x 3 laps @ 185 ft each stopped due to  End of study, sats 89% and sob @ fast pace  - Allergy profile 06/14/17 >  Eos 0.1/  IgE    38 neg RAST  - PFT's  08/20/2017  FEV1 2.23 (84 % ) ratio 76  p 2 % improvement from saba p nothing prior to study with DLCO  45/55c % corrects to 78 % for alv volume   - Flared with COVID19 pna 09/2019  -  01/19/2021   Walked RA  approx   300 ft  @ fast pace  stopped due to  sats 86% min sob    - HRCT 01/21/2021 1. There is minimal irregular peripheral interstitial opacity most notable at the lung bases and involving non dependent portions of the right middle lobe and lingula. Findings suggest minimal fibrosis 2 Mild, lobular air trapping on expiratory phase imaging, suggestive of small airways disease. 3. Emphysema. 6. Coronary artery disease -  PFTs 01/31/21 no change vs priors with mild concavity dlco 10.3 (42%) not corrected for hgb -  02/18/2021   Walked RA  approx   600 ft  @ moderate pace  stopped due to  End of study,  Min sob and sats 90% at end with hgb 12.7    - Echo 10/05/2021  G1 diastolic dysfunction only, nl LA/RA - 08/18/2021   Walked on RA x  3  lap(s) =  approx 437ft @ mod to fast pace, stopped due to end of study with mild sob and   with lowest 02 sats 90%   - 05/18/2023 Acute eval for sob x 4 weeks with sats 86% RA p rest x 5 min > to ER  with nl echo, no repeat CT / no evidence of copd clinically or by last pfts though he does have emphysema on prior ct so this would be COPD GOLD 0, not stage 1   Rec: Rx any AB component with symbicort/ combivent prn  - The proper method of use, as well as anticipated side effects, of a metered-dose inhaler and soft mist (respimat)  were discussed and demonstrated to the patient using an empty symicort device   HRCT due to sorrt out why worse sats even when hg 12  Monitor hgb closely  Titrate 02  (see separate a/p)   Return in 4 weeks with all meds in hand using a trust but verify approach to confirm accurate Medication  Reconciliation The principal here is that until we are certain that the  patients are doing what we've asked, it makes no sense to ask them to do more.

## 2023-06-30 NOTE — Telephone Encounter (Signed)
After chart review I discovered the CT I wanted was not done at his admit so need to do it before next ov - I placed the order so let him know

## 2023-06-30 NOTE — Assessment & Plan Note (Signed)
Rx 02 4lpm since admit 05/18/23  - 06/29/2023   Walked on 4lpm  x  1  lap(s) =  approx 150  ft  @ slow pace, stopped due to weakness with lowest 02 sats 87%    D dimer nl at admit/ echo done with nl LA RA but no bnp at time of arrival. Hgb now now ok but prone to dropping s source identified and this can definitely add to desats/ weakness   HRCT ordered/ in meantime advised:  Make sure you check your oxygen saturation  AT  your highest level of activity (not after you stop)   to be sure it stays over 90% and adjust  02 flow upward to maintain this level if needed but remember to turn it back to previous settings when you stop (to conserve your supply).          Each maintenance medication was reviewed in detail including emphasizing most importantly the difference between maintenance and prns and under what circumstances the prns are to be triggered using an action plan format where appropriate.  Total time for H and P, chart review, counseling, reviewing hfa/02/ pulse ox device(s) and generating customized AVS unique to this office visit / same day charting = 41 min post hosp for refractory resp problems of unknown origin.

## 2023-06-30 NOTE — Addendum Note (Signed)
Addended by: Sandrea Hughs B on: 06/30/2023 07:53 AM   Modules accepted: Level of Service

## 2023-07-03 NOTE — Telephone Encounter (Signed)
CT chest scheduled for 07/23/23

## 2023-07-05 DIAGNOSIS — D509 Iron deficiency anemia, unspecified: Secondary | ICD-10-CM | POA: Diagnosis not present

## 2023-07-13 DIAGNOSIS — Z79899 Other long term (current) drug therapy: Secondary | ICD-10-CM | POA: Diagnosis not present

## 2023-07-13 DIAGNOSIS — I5032 Chronic diastolic (congestive) heart failure: Secondary | ICD-10-CM | POA: Diagnosis not present

## 2023-07-13 DIAGNOSIS — J9611 Chronic respiratory failure with hypoxia: Secondary | ICD-10-CM | POA: Diagnosis not present

## 2023-07-19 DIAGNOSIS — J9611 Chronic respiratory failure with hypoxia: Secondary | ICD-10-CM | POA: Diagnosis not present

## 2023-07-19 DIAGNOSIS — I5032 Chronic diastolic (congestive) heart failure: Secondary | ICD-10-CM | POA: Diagnosis not present

## 2023-07-20 ENCOUNTER — Other Ambulatory Visit: Payer: Self-pay

## 2023-07-20 DIAGNOSIS — D509 Iron deficiency anemia, unspecified: Secondary | ICD-10-CM

## 2023-07-23 ENCOUNTER — Ambulatory Visit (HOSPITAL_COMMUNITY)
Admission: RE | Admit: 2023-07-23 | Discharge: 2023-07-23 | Disposition: A | Discharge status: Home / Self Care | Payer: Medicare HMO | Source: Ambulatory Visit | Attending: Internal Medicine | Admitting: Internal Medicine

## 2023-07-23 DIAGNOSIS — R918 Other nonspecific abnormal finding of lung field: Secondary | ICD-10-CM | POA: Diagnosis not present

## 2023-07-23 DIAGNOSIS — R590 Localized enlarged lymph nodes: Secondary | ICD-10-CM | POA: Diagnosis not present

## 2023-07-23 DIAGNOSIS — R0609 Other forms of dyspnea: Secondary | ICD-10-CM | POA: Insufficient documentation

## 2023-07-23 DIAGNOSIS — J849 Interstitial pulmonary disease, unspecified: Secondary | ICD-10-CM | POA: Diagnosis not present

## 2023-07-26 NOTE — Progress Notes (Unsigned)
Subjective:    Patient ID: Christian [facility], male   DOB: 24-Jun-1946     MRN: [id number]    Brief patient profile:  53 yowm quit smoking 2000 with mild doe @ wt 156  and did fine until around 2013 gave up pushing mower and gradually downhill since referred to pulmonary clinic 06/14/2017 by Dr   Ouida Sills   06/14/2017 1st [facility] visit/ Christian [facility]  No resp rx  Chief Complaint  Patient presents with   Pulmonary Consult    Referred by Dr. Carylon Perches. Pt c/o SOB x 6 months, esp worse over the past 3 months. He states he gets out of breath walking an incline, such as to his mailbox. He states that he sometimes he wakes up in the night feeling SOB.  He also c/o non prod cough.   doe x 5 years,  Stops at top of incline on way back to house x 6 months  = MMRC1 = can walk nl pace, flat grade, can't hurry or go uphills or steps s sob   Never tried inhalers Worse in heat and humbidity  freq wakes with dry cough / chest tightness sits up and goes away  rec You do not have significant copd at this point and likely never will  Pantoprazole (protonix) 40 mg   Take  30-60 min before first meal of the day and Pepcid (famotidine)  20 mg one @  bedtime until return to [facility] - this is the best way to tell whether stomach acid is contributing to your problem.   GERD diet     08/20/2017  f/u ov/Christian [facility] re: doe on gerd rx/ gi eval in progress for anemia/ no resp rx  Chief Complaint  Patient presents with   Follow-up    PFT's done today. Increased SOB for the past day. Chest feels heavy today.  continues to have noct spells of sob despite nl BNP and no evidence of chf on cxr or obvious anemia Tells me his hgb still trending down and GI eval in progess rec You do not have any significant copd If you continue to have breathing issues after your anemia is corrected might consider trial off ACTOS next and /or cardiology evaluation    Admission date:  10/03/2019     Discharge Date:  10/07/2019    Discharge Diagnosis  AKI (acute kidney injury) (HCC) [N17.9] COVID-19 virus infection [U07.1]     Acute respiratory failure with hypoxia (HCC)   Acute respiratory disease due to COVID-19 virus   Type 2 diabetes mellitus without complication (HCC)   Essential hypertension      10/27/19 History of Present Illness: Dyspnea:  2 laps off 02 prior to discharge / trend is improving at home but not checking sats  Cough: minimal clear  Sleeping: flat/ one pillow SABA use: none 02: none  Dex last day 10/12/2019 rec Make sure you check your oxygen saturation at your highest level of activity to be sure it stays over 90% and keep track of it at least once a week, more often if breathing getting worse, and let me know if losing ground.>>> did not do       01/19/2021  f/u ov/[place] [facility]/Christian [facility] re: doe p covid  Chief Complaint  Patient presents with   Follow-up    Breathing is overall doing well and no Christian co's today.    Dyspnea:  Up and down the hill to the mb daily feels it getting a little harder  than it was about a month ago since feet started bothering him with paresthesias and lack of balance  Cough: none/ some p eating assoc with mild dysphagia ? Barrett's  Sleeping: flat bed / R side / 1 pillow SABA use: none  02: none Covid status: vax x 3  rec Stay as active as you can and call if losing ground with exercise tolerance  Pulmonary follow up is as directed by Dr Ouida Sills  Late add:  Based on desats with no explanation and c/o worse ex tol > HRCT/pfts next step > no specific findings/ 7 mm nodule needs f/u @ 6-12 m   08/18/2021  f/u ov/Christian [facility]/Christian [facility] re: uacs maint on ppi ac and pepcid hs with Fe def anemia w/u in progress  Chief Complaint  Patient presents with   Follow-up    Pt reports that the change of weather does affect his breathing but it has improved since last visit. Pt feels that he is coughing more but has endoscopy today.   Dyspnea:  some better from mailbox  to house with hgb improving  Using pedal ex x 15 min   Cough: still an issue, dry worse when lie down Sleeping: bed is slt raised, one pilow  SABA use: none  02: none  Covid status: vax x 3 and covid  Rec Increase gabapentin to where an extra pill first in evening  then add a pill weekly until cough is gone with a  maximum 300 mg three times a day and call me with the amount that you need to eliminate the cough For drainage / throat tickle try take CHLORPHENIRAMINE  4 mg  Make sure you check your oxygen saturation at your highest level of activity to be sure it stays over 90%  Echo 10/05/2021  G1 diastolic dysfunction only, nl LA RA Please schedule a follow up visit in 3 months but call sooner if needed     11/22/2021  f/u ov/Christian [facility]/Christian [facility] re: doe/uacs maint on gabapentin 400 mg per day  Chief Complaint  Patient presents with   Follow-up    Echo done 10-05-21  Cold weather is making breathing worse.   Would like flu shot today   Dyspnea: appears to correlate with hgb Cough: no more noct noct cough p h1 at hs Sleeping: perfectly flat bed, one pillow   SABA use: none  02: none  Covid status: vax x 3 and covid infection Gi w/u in progress for hgb 6 Sob also if bends over Rec Gabapentin try 300 mg twice daily and if not satisfied go ahead with 3 x daily  Don't push exercise any harder until you get your hemoglobin problem corrected Pulmonary follow up is as needed       04/2023 started on 02    06/29/2023  post hosp f/u ov/[place] [facility]/Christian [facility] re: doe/ cough p covid 09/2019 maint on symbicort 160  better since admit  Chief Complaint  Patient presents with   Shortness of Breath  Dyspnea:  has not  tried mailbox yet, just room to room and not titraitng 02  Cough: no change baseline  Sleeping: bed is flat/ high pillow under head  SABA use: combivent  02: 4lpm cont since admit  Rec Continue 4lpm at bedtime for now  Make sure you check your oxygen saturation  AT   your highest level of activity (not after you stop)   to be sure it stays over 90%  We need to know whether you are acutally  passing blood in your stools to sort out whether it is safe to continue daily aspirin. Please schedule a follow up [facility] visit in 4 weeks, sooner if needed  with all medications /inhalers/ solutions in hand    07/27/2023  f/u ov/[place] [facility]/[place] re: post covid resp failure maint on symbicort/ prn combivent  f/u hrct done 07/23/23  Chief Complaint  Patient presents with   Dyspnea on exertion  Dyspnea:  last mailbox trip was prior to June 2024 100 ft with hills - wife worried about his balance   Cough: not a problem  Sleeping: bed is flat s  resp cc  SABA use:  not since last ov  02: 4lpm cont s humidity     No obvious day to day or daytime variability or assoc excess/ purulent sputum or mucus plugs or hemoptysis or cp or chest tightness, subjective wheeze or overt sinus or hb symptoms.    Also denies any obvious fluctuation of symptoms with weather or environmental changes or other aggravating or alleviating factors except as outlined above   No unusual exposure hx or h/o childhood pna/ asthma or knowledge of premature birth.  Current Allergies, Complete Past Medical History, Past Surgical History, Family History, and Social History were reviewed in [employer] record.  ROS  The following are not active complaints unless bolded Hoarseness, sore throat, dysphagia, dental problems, itching, sneezing,  nasal congestion or discharge of excess mucus or purulent secretions, ear ache,   fever, chills, sweats, unintended wt loss or wt gain, classically pleuritic or exertional cp,  orthopnea pnd or arm/hand swelling  or leg swelling, presyncope, palpitations, abdominal pain, anorexia, nausea, vomiting, diarrhea  or change in bowel habits or change in bladder habits, change in stools or change in urine, dysuria, hematuria,  rash, arthralgias, visual  complaints, headache, numbness, weakness or ataxia or problems with walking or coordination,  change in mood or  memory.        Current Meds  Medication Sig   acetaminophen (TYLENOL) 500 MG tablet Take 1,000 mg by mouth every 6 (six) hours as needed (headaches/pain.).   aspirin EC 81 MG tablet Take 81 mg by mouth every evening. Swallow whole.   atorvastatin (LIPITOR) 10 MG tablet Take 10 mg by mouth at bedtime.   budesonide-formoterol (SYMBICORT) 160-4.5 MCG/ACT inhaler Inhale 2 puffs into the lungs in the morning and at bedtime.   chlorpheniramine (CHLOR-TRIMETON) 4 MG tablet Take 4 mg by mouth in the morning and at bedtime.   cyanocobalamin (VITAMIN B12) 1000 MCG tablet Take 1,000 mcg by mouth daily.   famotidine (PEPCID) 20 MG tablet Take 20 mg by mouth daily after supper. Takes after supper   Ferrous Gluconate (IRON 27 PO) Take 1 tablet by mouth daily.   furosemide (LASIX) 20 MG tablet Take 1 tablet (20 mg total) by mouth daily.   gabapentin (NEURONTIN) 300 MG capsule Take 1 capsule (300 mg total) by mouth 2 (two) times daily.   glipiZIDE (GLUCOTROL XL) 5 MG 24 hr tablet Take 5 mg by mouth daily with breakfast.   Ipratropium-Albuterol (COMBIVENT RESPIMAT) 20-100 MCG/ACT AERS respimat Inhale 1 puff into the lungs every 6 (six) hours as needed for wheezing or shortness of breath.   LANTUS SOLOSTAR 100 UNIT/ML Solostar Pen Inject 45 Units into the skin in the morning.   losartan (COZAAR) 50 MG tablet Take 50 mg by mouth in the morning.   metFORMIN (GLUCOPHAGE) 1000 MG tablet Take 1,000 mg by mouth 2 (two)  times daily.   NOVOLOG FLEXPEN 100 UNIT/ML FlexPen Inject 6 Units into the skin 2 (two) times daily.   pantoprazole (PROTONIX) 40 MG tablet TAKE 1 TABLET BY MOUTH ONE TIME DAILY 30-60 MINUTES BEFORE BREAKFAST   potassium chloride SA (KLOR-CON M) 20 MEQ tablet Take 1 tablet (20 mEq total) by mouth daily.              Objective:   Physical Exam   Wts  07/27/2023        169   06/29/2023           168  05/18/2023        171  11/22/2021      174    08/18/2021       170  02/18/2021       175 01/19/2021       173   08/20/2017      179   06/14/17 184 lb (83.5 kg)  02/06/17 185 lb (83.9 kg)  01/24/17 184 lb (83.5 kg)   Vital signs reviewed  07/27/2023  - Note at rest 02 sats  79 % on 3lpm    General appearance:    frail elderly wm nad at rest   HEENT : Oropharynx  clear/ edentulous   Nasal turbinates crusting L>R  with moderate obst/ non specific swelling    NECK :  without  apparent JVD/ palpable Nodes/TM    LUNGS: no acc muscle use,  Min barrel  contour chest wall with bilateral  slightly decreased bs s audible wheeze and  without cough on insp or exp maneuvers and min  Hyperresonant  to  percussion bilaterally    CV:  RRR  no s3 or murmur or increase in P2, and no edema   ABD:  soft and nontender with pos end  insp Hoover's  in the supine position.  No bruits or organomegaly appreciated   MS:  Nl gait/ ext warm without deformities Or obvious joint restrictions  calf tenderness, cyanosis or clubbing     SKIN: warm and dry without lesions    NEURO:  alert, approp, nl sensorium with  no motor or cerebellar deficits apparent.                  Assessment:

## 2023-07-27 ENCOUNTER — Encounter: Payer: Self-pay | Admitting: Internal Medicine

## 2023-07-27 ENCOUNTER — Ambulatory Visit: Payer: Medicare HMO | Admitting: Internal Medicine

## 2023-07-27 VITALS — BP 116/60 | Ht 66.0 in | Wt 169.0 lb

## 2023-07-27 DIAGNOSIS — R0609 Other forms of dyspnea: Secondary | ICD-10-CM | POA: Diagnosis not present

## 2023-07-27 DIAGNOSIS — J9611 Chronic respiratory failure with hypoxia: Secondary | ICD-10-CM

## 2023-07-27 MED ORDER — BREZTRI AEROSPHERE 160-9-4.8 MCG/ACT IN AERO: 2.0000 | INHALATION_SPRAY | Freq: Two times a day (BID) | RESPIRATORY_TRACT | Status: DC

## 2023-07-27 NOTE — Patient Instructions (Signed)
My office will be contacting you by phone for referral to [facility] for best fit evaluation for portable 02 and humdity for 02  - if you don't hear back from my office within one week please call us back or notify us thru MyChart and we'll address it right away.   Try breztri (or symbicort)  Take 2 puffs first thing in am and then another 2 puffs about 12 hours later.    Work on inhaler technique:  relax and gently blow all the way out then take a nice smooth full deep breath back in, triggering the inhaler at same time you start breathing in.  Hold breath in for at least  5 seconds if you can. Blow out breatri  thru nose. Rinse and gargle with water when done.  If mouth or throat bother you at all,  try brushing teeth/gums/tongue with arm and hammer toothpaste/ make a slurry and gargle and spit out.   >>>  Remember how golfers warm up by taking practice swings - do this with an empty inhaler    I emphasized that nasal steroids (.Nasacort AQ) have no immediate benefit in terms of improving symptoms.  To help them reached the target tissue, the patient should use Afrin two puffs every 12 hours applied one min before using the nasal steroids.  Afrin should be stopped after no more than 5 days.  If the symptoms worsen, Afrin can be restarted after 5 days off of therapy to prevent rebound congestion from overuse of Afrin.  I also emphasized that in no way are nasal steroids a concern in terms of "addiction".    Please schedule a follow up visit in 3 months but call sooner if needed

## 2023-07-28 LAB — CBC WITH DIFFERENTIAL/PLATELET
Basophils Absolute: 0.1 10*3/uL (ref 0.0–0.2)
Basos: 1 %
EOS (ABSOLUTE): 0.2 10*3/uL (ref 0.0–0.4)
Eos: 1 %
Hematocrit: 39.4 % (ref 37.5–51.0)
Hemoglobin: 12.5 g/dL — ABNORMAL LOW (ref 13.0–17.7)
Immature Grans (Abs): 0 10*3/uL (ref 0.0–0.1)
Immature Granulocytes: 0 %
Lymphocytes Absolute: 0.8 10*3/uL (ref 0.7–3.1)
Lymphs: 7 %
MCH: 26.4 pg — ABNORMAL LOW (ref 26.6–33.0)
MCHC: 31.7 g/dL (ref 31.5–35.7)
MCV: 83 fL (ref 79–97)
Monocytes Absolute: 1.2 10*3/uL — ABNORMAL HIGH (ref 0.1–0.9)
Monocytes: 10 %
Neutrophils Absolute: 9.6 10*3/uL — ABNORMAL HIGH (ref 1.4–7.0)
Neutrophils: 81 %
Platelets: 388 10*3/uL (ref 150–450)
RBC: 4.74 x10E6/uL (ref 4.14–5.80)
RDW: 17.6 % — ABNORMAL HIGH (ref 11.6–15.4)
WBC: 11.9 10*3/uL — ABNORMAL HIGH (ref 3.4–10.8)

## 2023-07-28 LAB — BASIC METABOLIC PANEL
BUN/Creatinine Ratio: 15 (ref 10–24)
BUN: 17 mg/dL (ref 8–27)
CO2: 24 mmol/L (ref 20–29)
Calcium: 9.9 mg/dL (ref 8.6–10.2)
Chloride: 102 mmol/L (ref 96–106)
Creatinine, Ser: 1.12 mg/dL (ref 0.76–1.27)
Glucose: 197 mg/dL — ABNORMAL HIGH (ref 70–99)
Potassium: 5 mmol/L (ref 3.5–5.2)
Sodium: 140 mmol/L (ref 134–144)
eGFR: 68 mL/min/{1.73_m2} (ref >59)

## 2023-07-28 LAB — BRAIN NATRIURETIC PEPTIDE: BNP: 75.5 pg/mL (ref 0.0–100.0)

## 2023-07-28 LAB — SEDIMENTATION RATE: Sed Rate: 14 mm/h (ref 0–30)

## 2023-07-28 LAB — D-DIMER, QUANTITATIVE: D-DIMER: 0.65 mg{FEU}/L — ABNORMAL HIGH (ref 0.00–0.49)

## 2023-07-29 NOTE — Assessment & Plan Note (Signed)
Onset  2013 p quit smoking in 2000  Spirometry 06/14/2017  FEV1 1.91 (70%)  Ratio 73 mild curvature  -  06/14/2017  Walked RA x 3 laps @ 185 ft each stopped due to  End of study, sats 89% and sob @ fast pace  - Allergy profile 06/14/17 >  Eos 0.1/  IgE    38 neg RAST  - PFT's  08/20/2017  FEV1 2.23 (84 % ) ratio 76  p 2 % improvement from saba p nothing prior to study with DLCO  45/55c % corrects to 78 % for alv volume   - Flared with COVID19 pna 09/2019  -  01/19/2021   Walked RA  approx   300 ft  @ fast pace  stopped due to  sats 86% min sob    - HRCT 01/21/2021 1. There is minimal irregular peripheral interstitial opacity most notable at the lung bases and involving non dependent portions of the right middle lobe and lingula. Findings suggest minimal fibrosis 2 Mild, lobular air trapping on expiratory phase imaging, suggestive of small airways disease. 3. Emphysema. 6. Coronary artery disease -  PFTs 01/31/21 no change vs priors with mild concavity dlco 10.3 (42%) not corrected for hgb -  02/18/2021   Walked RA  approx   600 ft  @ moderate pace  stopped due to  End of study,  Min sob and sats 90% at end with hgb 12.7    - Echo 10/05/2021  G1 diastolic dysfunction only, nl LA/RA - 08/18/2021   Walked on RA x  3  lap(s) =  approx 441ft @ mod to fast pace, stopped due to end of study with mild sob and   with lowest 02 sats 90%   - 05/18/2023 Acute eval for sob x 4 weeks with sats 86% RA p rest x 5 min > to ER  - HRCT  07/23/23  no new findings (preliminary observation)   - The proper method of use, as well as anticipated side effects, of a metered-dose inhaler were discussed and demonstrated to the patient using teach back method. Improved effectiveness after extensive coaching during this visit to a level of approximately 75%  % from a baseline of 50% % (ti too short)   >>> doubt much AB here but worthwhile trying more consistent use of symbicort (breztri samples) with approp saba:  Re SABA :  I spent  extra time with pt today reviewing appropriate use of albuterol for prn use on exertion with the following points: 1) saba is for relief of sob that does not improve by walking a slower pace or resting but rather if the pt does not improve after trying this first. 2) If the pt is convinced, as many are, that saba helps recover from activity faster then it's easy to tell if this is the case by re-challenging : ie stop, take the inhaler, then p 5 minutes try the exact same activity (intensity of workload) that just caused the symptoms and see if they are substantially diminished or not after saba 3) if there is an activity that reproducibly causes the symptoms, try the saba 15 min before the activity on alternate days   If in fact the saba really does help, then fine to continue to use it prn but advised may need to look closer at the maintenance regimen being used to achieve better control of airways disease with exertion.

## 2023-07-29 NOTE — Assessment & Plan Note (Signed)
Rx 02 4lpm since admit 05/18/23  - 06/29/2023   Walked on 4lpm  x  1  lap(s) =  approx 150  ft  @ slow pace, stopped due to weakness with lowest 02 sats 87%   - 07/27/2023 added humidity and referred for best fit for amb 02   Doubt getting effective 02 given nasal exam so rec trial of humidity / afrin and Nasacort AQ  and refer to ENT if not improving.          Each maintenance medication was reviewed in detail including emphasizing most importantly the difference between maintenance and prns and under what circumstances the prns are to be triggered using an action plan format where appropriate.  Total time for H and P, chart review, counseling, reviewing hfa/02/pulse ox /smi device(s) and generating customized AVS unique to this office visit / same day charting  > 30 min for multiple chronic refractory respiratory  symptoms/problems

## 2023-07-30 ENCOUNTER — Encounter: Payer: Self-pay | Admitting: Internal Medicine

## 2023-07-30 DIAGNOSIS — J8410 Pulmonary fibrosis, unspecified: Secondary | ICD-10-CM | POA: Insufficient documentation

## 2023-07-31 ENCOUNTER — Telehealth: Payer: Self-pay | Admitting: Internal Medicine

## 2023-07-31 DIAGNOSIS — I10 Essential (primary) hypertension: Secondary | ICD-10-CM | POA: Diagnosis not present

## 2023-07-31 DIAGNOSIS — E119 Type 2 diabetes mellitus without complications: Secondary | ICD-10-CM | POA: Diagnosis not present

## 2023-07-31 DIAGNOSIS — H16223 Keratoconjunctivitis sicca, not specified as Sjogren's, bilateral: Secondary | ICD-10-CM | POA: Diagnosis not present

## 2023-07-31 DIAGNOSIS — H35033 Hypertensive retinopathy, bilateral: Secondary | ICD-10-CM | POA: Diagnosis not present

## 2023-07-31 DIAGNOSIS — J9611 Chronic respiratory failure with hypoxia: Secondary | ICD-10-CM

## 2023-07-31 DIAGNOSIS — H35373 Puckering of macula, bilateral: Secondary | ICD-10-CM | POA: Diagnosis not present

## 2023-07-31 DIAGNOSIS — Z947 Corneal transplant status: Secondary | ICD-10-CM | POA: Diagnosis not present

## 2023-07-31 DIAGNOSIS — H40023 Open angle with borderline findings, high risk, bilateral: Secondary | ICD-10-CM | POA: Diagnosis not present

## 2023-07-31 NOTE — Telephone Encounter (Signed)
I received a message from [place] with [employer] the order for best fit evaluation for POC needs to have order put in correctly  Please have provider/nurse update or create new order for POC eval/best fit using the verbiage below.  "Please evaluate and titrate for best fit POC or portable O2 system. RT to evaluate and titrate patient for POC or Homefill with OCD maintain sats >/=90%. if patient qualifies, dispense POC or homefill with OCD 1-5 pulse dose."

## 2023-08-02 NOTE — Telephone Encounter (Signed)
New order placed. CM sent to [facility] to advise.

## 2023-08-13 DIAGNOSIS — Z79899 Other long term (current) drug therapy: Secondary | ICD-10-CM | POA: Diagnosis not present

## 2023-08-13 DIAGNOSIS — J9611 Chronic respiratory failure with hypoxia: Secondary | ICD-10-CM | POA: Diagnosis not present

## 2023-08-13 DIAGNOSIS — J449 Chronic obstructive pulmonary disease, unspecified: Secondary | ICD-10-CM | POA: Diagnosis not present

## 2023-08-13 DIAGNOSIS — E1129 Type 2 diabetes mellitus with other diabetic kidney complication: Secondary | ICD-10-CM | POA: Diagnosis not present

## 2023-08-13 DIAGNOSIS — I5032 Chronic diastolic (congestive) heart failure: Secondary | ICD-10-CM | POA: Diagnosis not present

## 2023-08-17 ENCOUNTER — Telehealth: Payer: Self-pay | Admitting: Internal Medicine

## 2023-08-17 NOTE — Telephone Encounter (Signed)
Patient is trying to return call to [facility]. He states that call dropped earlier. Please call back at [phone]

## 2023-08-20 DIAGNOSIS — I700 Atherosclerosis of aorta: Secondary | ICD-10-CM | POA: Diagnosis not present

## 2023-08-20 DIAGNOSIS — I5030 Unspecified diastolic (congestive) heart failure: Secondary | ICD-10-CM | POA: Diagnosis not present

## 2023-08-20 DIAGNOSIS — E875 Hyperkalemia: Secondary | ICD-10-CM | POA: Diagnosis not present

## 2023-08-20 DIAGNOSIS — E1122 Type 2 diabetes mellitus with diabetic chronic kidney disease: Secondary | ICD-10-CM | POA: Diagnosis not present

## 2023-08-20 DIAGNOSIS — Z23 Encounter for immunization: Secondary | ICD-10-CM | POA: Diagnosis not present

## 2023-08-23 NOTE — Telephone Encounter (Signed)
Patient states call dropped as he was getting result of CT Scan. Patient aware of results and plan. Please place referral to PF Clinic.  Call patient :  Christian [facility] is consistent with mild but progressive (vs last year) PF > refer to PF clinic, no change rx in meantime

## 2023-08-29 NOTE — Telephone Encounter (Signed)
Please schedule pt with Dr.Mannam or Ramaswamy in the next available slot

## 2023-08-30 NOTE — Telephone Encounter (Signed)
Appt made. NFN

## 2023-09-03 ENCOUNTER — Encounter: Payer: Self-pay | Admitting: Internal Medicine

## 2023-09-03 ENCOUNTER — Ambulatory Visit: Payer: Medicare HMO | Admitting: Internal Medicine

## 2023-09-03 VITALS — BP 116/74 | Temp 97.7°F | Ht 66.0 in | Wt 171.0 lb

## 2023-09-03 DIAGNOSIS — R000 Tachycardia, unspecified: Secondary | ICD-10-CM | POA: Diagnosis not present

## 2023-09-03 DIAGNOSIS — R0609 Other forms of dyspnea: Secondary | ICD-10-CM

## 2023-09-03 DIAGNOSIS — I493 Ventricular premature depolarization: Secondary | ICD-10-CM

## 2023-09-03 DIAGNOSIS — J849 Interstitial pulmonary disease, unspecified: Secondary | ICD-10-CM | POA: Diagnosis not present

## 2023-09-03 DIAGNOSIS — I2510 Atherosclerotic heart disease of native coronary artery without angina pectoris: Secondary | ICD-10-CM

## 2023-09-03 LAB — BASIC METABOLIC PANEL
BUN: 24 mg/dL — ABNORMAL HIGH (ref 6–23)
CO2: 24 meq/L (ref 19–32)
Calcium: 10.1 mg/dL (ref 8.4–10.5)
Chloride: 102 meq/L (ref 96–112)
Creatinine, Ser: 1.17 mg/dL (ref 0.40–1.50)
GFR: 59.93 mL/min — ABNORMAL LOW (ref >60.00)
Glucose, Bld: 62 mg/dL — ABNORMAL LOW (ref 70–99)
Potassium: 4.2 meq/L (ref 3.5–5.1)
Sodium: 138 meq/L (ref 135–145)

## 2023-09-03 LAB — CBC WITH DIFFERENTIAL/PLATELET
Basophils Absolute: 0.1 10*3/uL (ref 0.0–0.1)
Basophils Relative: 1 % (ref 0.0–3.0)
Eosinophils Absolute: 0.1 10*3/uL (ref 0.0–0.7)
Eosinophils Relative: 1.2 % (ref 0.0–5.0)
HCT: 40.5 % (ref 39.0–52.0)
Hemoglobin: 12.9 g/dL — ABNORMAL LOW (ref 13.0–17.0)
Lymphocytes Relative: 5.6 % — ABNORMAL LOW (ref 12.0–46.0)
Lymphs Abs: 0.7 10*3/uL (ref 0.7–4.0)
MCHC: 31.9 g/dL (ref 30.0–36.0)
MCV: 84.6 fL (ref 78.0–100.0)
Monocytes Absolute: 1.2 10*3/uL — ABNORMAL HIGH (ref 0.1–1.0)
Monocytes Relative: 9.2 % (ref 3.0–12.0)
Neutro Abs: 10.6 10*3/uL — ABNORMAL HIGH (ref 1.4–7.7)
Neutrophils Relative %: 83 % — ABNORMAL HIGH (ref 43.0–77.0)
Platelets: 410 10*3/uL — ABNORMAL HIGH (ref 150.0–400.0)
RBC: 4.79 Mil/uL (ref 4.22–5.81)
RDW: 19.5 % — ABNORMAL HIGH (ref 11.5–15.5)
WBC: 12.8 10*3/uL — ABNORMAL HIGH (ref 4.0–10.5)

## 2023-09-03 LAB — HEPATIC FUNCTION PANEL
ALT: 19 U/L (ref 0–53)
AST: 24 U/L (ref 0–37)
Albumin: 4.4 g/dL (ref 3.5–5.2)
Alkaline Phosphatase: 73 U/L (ref 39–117)
Bilirubin, Direct: 0.2 mg/dL (ref 0.0–0.3)
Total Bilirubin: 1.2 mg/dL (ref 0.2–1.2)
Total Protein: 7.7 g/dL (ref 6.0–8.3)

## 2023-09-03 LAB — SEDIMENTATION RATE: Sed Rate: 25 mm/h — ABNORMAL HIGH (ref 0–20)

## 2023-09-03 NOTE — Progress Notes (Signed)
OV 09/03/2023 transfer of care from Dr. Sandrea Hughs to Dr. Marchelle Gearing in the ILD center.  Subjective:  Patient ID: Christian Woodard, male , DOB: 02-16-1946 , age 77 y.o. , MRN: [id number] , ADDRESS: [id number] [place] [id number] PCP Carylon Perches, MD Patient Care Team: Carylon Perches, MD as PCP - General (Internal Medicine) Jena Gauss Gerrit Friends, MD as Consulting Physician (Gastroenterology) Nyoka Cowden, MD as Consulting Physician (Pulmonary Disease)  This Provider for this visit: Treatment Team:  Attending Provider: Kalman Shan, MD    09/03/2023 -   Chief Complaint  Patient presents with   Pulmonary Consult     Referred by Dr. Sherene Sires for IPF.      HPI Christian Woodard 77 y.o. -presents on referral from Dr. er.  History is taken from the patient, his wife's granddaughter who stated bedside and also reviewed the records.  He tells me that in 2020 he had COVID-19 he was hospitalized at [facility] here in [place].  This was then the COVID hospital.  He was treated with oxygen and then he got better.  And then he was almost back to baseline according to the wife.  Occasionally gets short of breath.  Then starting in April May 2024 he started noticing shortness of breath going to the mailbox and coming back.  This then resulted in hospitalization for 1 day at [facility]..  Much of this history was confirmed there.  He had new onset hypoxemia and discharged on 3-4 L oxygen which she continues to use but there is a problem checking his pulse ox because the finger tracings are very poor at home.  He has continued this amount of oxygen.  The finger pulse ox would not pick up at all.  I turned his oxygen off and his pulse ox today was 88-91% on room air with a forehead probe [corresponding finger probe was 55%].  Review of the records indicate that he did have history of chronic Barrett's and's esophagus.  In the hospital iron deficiency anemia hemoglobin 12.5 g was  noticed.  He was started on supplemental iron.  He underwent endoscopy later in July 2024 at [facility].  Barrett's esophagus with low-grade dysplasia was confirmed.  In the hospital he was discharged with a diagnosis of acute/subacute hypoxemic respiratory failure secondary to pulmonary fibrosis.  Also diastolic heart failure.  An echo in the hospital showed EF 65 to 70%.  It appears after that he was diuresed.  A follow-up BNP in August 2024 is now.    He did undergo follow-up high-resolution CT chest July 23, 2023: It shows definite presence of ILD in the bases and a craniocaudal gradient.  It is described as probable UIP but I wonder if this is all indeterminate.  He had a previous CT chest in June 2023 that did have some mild ILD findings and before that in February 2022 which was mostly ILA/nearly normal in my view.  He has had abnormal PFTs with a low DLCO is 2018 last PFT 2022 and between 2018 and 2022 DLCO reduced and overall stable.  His current CT does show coronary artery calcification.  Twelve-lead EKG today because he was tachycardic and irregular showed PVC but sinus rhythm otherwise.  It appears no change from baseline.  I personally visualized this.  CT Chest data from date: 07/23/23  - personally visualized and independently interpreted : YES - my findings are: as below Narrative & Impression  CLINICAL  DATA:  77 year old male with history of shortness of breath for the past 6 months, worsening over the past 3 months.   EXAM: CT CHEST WITHOUT CONTRAST   TECHNIQUE: Multidetector CT imaging of the chest was performed following the standard protocol without intravenous contrast. High resolution imaging of the lungs, as well as inspiratory and expiratory imaging, was performed.   RADIATION DOSE REDUCTION: This exam was performed according to the departmental dose-optimization program which includes automated exposure control, adjustment of the mA and/or kV according to patient  size and/or use of iterative reconstruction technique.   COMPARISON:  Chest CT 05/25/2022. High-resolution chest CT 01/21/2021.   FINDINGS: Cardiovascular: Heart size is normal. There is no significant pericardial fluid, thickening or pericardial calcification. There is aortic atherosclerosis, as well as atherosclerosis of the great vessels of the mediastinum and the coronary arteries, including calcified atherosclerotic plaque in the left main, left anterior descending, left circumflex and right coronary arteries.   Mediastinum/Nodes: No pathologically enlarged mediastinal or hilar lymph nodes. Please note that accurate exclusion of hilar adenopathy is limited on noncontrast CT scans. Esophagus is unremarkable in appearance. No axillary lymphadenopathy.   Lungs/Pleura: High-resolution images demonstrate widespread areas of mild ground-glass attenuation, septal thickening, scattered subpleural reticulation, mild cylindrical bronchiectasis and some peripheral bronchiolectasis. These findings have a mild craniocaudal gradient and are mildly progressive compared to prior examinations. No frank honeycombing confidently identified. Inspiratory and expiratory imaging demonstrates some very mild air trapping, indicative of mild small airways disease. No acute consolidative airspace disease. No pleural effusions.   Upper Abdomen: Aortic atherosclerosis.  Status post cholecystectomy.   Musculoskeletal: There are no aggressive appearing lytic or blastic lesions noted in the visualized portions of the skeleton.   IMPRESSION: 1. Mild progression of interstitial lung disease with a spectrum of findings considered probable usual interstitial pneumonia (UIP) on today's examination. 2. Aortic atherosclerosis, in addition to left main and three-vessel coronary artery disease. Please note that although the presence of coronary artery calcium documents the presence of coronary artery disease,  the severity of this disease and any potential stenosis cannot be assessed on this non-gated CT examination. Assessment for potential risk factor modification, dietary therapy or pharmacologic therapy may be warranted, if clinically indicated.   Aortic Atherosclerosis (ICD10-I70.0).     Electronically Signed   By: Trudie Reed M.D.   On: 07/30/2023 10:59      Result History   PFT.     Latest Ref Rng & Units 01/31/2021    1:28 PM 08/20/2017    8:36 AM  PFT Results  FVC-Pre L 2.87  2.92   FVC-Predicted Pre % 81  80   FVC-Post L 2.97  2.93   FVC-Predicted Post % 84  80   Pre FEV1/FVC % % 73  75   Post FEV1/FCV % % 75  76   FEV1-Pre L 2.10  2.18   FEV1-Predicted Pre % 83  82   FEV1-Post L 2.23  2.23   DLCO uncorrected ml/min/mmHg 10.30  11.95   DLCO UNC% % 47  45   DLCO corrected ml/min/mmHg 63.39  14.67   DLCO COR %Predicted % 290  55   DLVA Predicted % 337  78   TLC L 5.16  4.82   TLC % Predicted % 84  78   RV % Predicted % 89  86       Latest Reference Range & Units 06/14/17 11:38  Sheep Sorrel IgE kU/L <0.10  Pecan/Hickory Tree IgE kU/L <0.10  IgE (Immunoglobulin E), Serum <115 kU/L 38  Allergen, D pternoyssinus,d7 kU/L <0.10  Cat Dander kU/L <0.10  Dog Dander kU/L <0.10  [place] Grass kU/L <0.10  [place] Grass kU/L <0.10  Timothy Grass kU/L <0.10  Cockroach kU/L <0.10  Aspergillus fumigatus, m3 kU/L <0.10  Allergen, Comm Silver Charletta Cousin, t9 kU/L <0.10  Allergen, Cottonwood, t14 kU/L <0.10  Elm IgE kU/L <0.10  Allergen, Mulberry, t76 kU/L <0.10  Allergen, Oak,t7 kU/L <0.10  Common Ragweed kU/L <0.10  Allergen, Mouse Urine Protein, e78 kU/L <0.10  D. farinae kU/L <0.10  Allergen, Cedar tree, t12 kU/L <0.10  Box Elder IgE kU/L <0.10  Rough Pigweed  IgE kU/L <0.10    Latest Reference Range & Units 07/27/23 11:12  BNP 0.0 - 100.0 pg/mL 75.5     Latest Reference Range & Units 01/12/22 10:44 10/24/22 10:16 05/01/23 08:37 07/05/23 10:26 07/27/23 11:12   EOS (ABSOLUTE) 0.0 - 0.4 x10E3/uL 0.2 0.2 0.2 0.1 0.2    LAB RESULTS last 96 hours No results found.  LAB RESULTS last 90 days Recent Results (from the past 2160 hour(s))  CBC w/Diff/Platelet     Status: Abnormal   Collection Time: 07/05/23 10:26 AM  Result Value Ref Range   WBC 15.4 (H) 3.4 - 10.8 x10E3/uL   RBC 4.82 4.14 - 5.80 x10E6/uL   Hemoglobin 12.8 (L) 13.0 - 17.7 g/dL   Hematocrit 25.9 56.3 - 51.0 %   MCV 82 79 - 97 fL   MCH 26.6 26.6 - 33.0 pg   MCHC 32.6 31.5 - 35.7 g/dL   RDW 87.5 (H) 64.3 - 32.9 %   Platelets 545 (H) 150 - 450 x10E3/uL   Neutrophils 83 Not Estab. %   Lymphs 5 Not Estab. %   Monocytes 9 Not Estab. %   Eos 1 Not Estab. %   Basos 1 Not Estab. %   Neutrophils Absolute 12.9 (H) 1.4 - 7.0 x10E3/uL   Lymphocytes Absolute 0.8 0.7 - 3.1 x10E3/uL   Monocytes Absolute 1.3 (H) 0.1 - 0.9 x10E3/uL   EOS (ABSOLUTE) 0.1 0.0 - 0.4 x10E3/uL   Basophils Absolute 0.1 0.0 - 0.2 x10E3/uL   Immature Granulocytes 1 Not Estab. %   Immature Grans (Abs) 0.1 0.0 - 0.1 x10E3/uL  Tissue transglutaminase, IgA     Status: None   Collection Time: 07/05/23 10:31 AM  Result Value Ref Range   Transglutaminase IgA <2 0 - 3 U/mL    Comment:                               Negative        0 -  3                               Weak Positive   4 - 10                               Positive           >10  Tissue Transglutaminase (tTG) has been identified  as the endomysial antigen.  Studies have demonstr-  ated that endomysial IgA antibodies have over 99%  specificity for gluten sensitive enteropathy.   Ferritin     Status: None   Collection Time: 07/05/23 10:32 AM  Result Value Ref Range   Ferritin 45 30 -  400 ng/mL  Sedimentation rate     Status: None   Collection Time: 07/27/23 11:12 AM  Result Value Ref Range   Sed Rate 14 0 - 30 mm/hr  Basic metabolic panel     Status: Abnormal   Collection Time: 07/27/23 11:12 AM  Result Value Ref Range   Glucose 197 (H) 70 - 99 mg/dL    BUN 17 8 - 27 mg/dL   Creatinine, Ser 1.30 0.76 - 1.27 mg/dL   eGFR 68 >86 VH/QIO/9.62   BUN/Creatinine Ratio 15 10 - 24   Sodium 140 134 - 144 mmol/L   Potassium 5.0 3.5 - 5.2 mmol/L   Chloride 102 96 - 106 mmol/L   CO2 24 20 - 29 mmol/L   Calcium 9.9 8.6 - 10.2 mg/dL  Brain natriuretic peptide     Status: None   Collection Time: 07/27/23 11:12 AM  Result Value Ref Range   BNP 75.5 0.0 - 100.0 pg/mL    Comment: Siemens ADVIA Centaur XP methodology  CBC with Differential/Platelet     Status: Abnormal   Collection Time: 07/27/23 11:12 AM  Result Value Ref Range   WBC 11.9 (H) 3.4 - 10.8 x10E3/uL   RBC 4.74 4.14 - 5.80 x10E6/uL   Hemoglobin 12.5 (L) 13.0 - 17.7 g/dL   Hematocrit 95.2 84.1 - 51.0 %   MCV 83 79 - 97 fL   MCH 26.4 (L) 26.6 - 33.0 pg   MCHC 31.7 31.5 - 35.7 g/dL   RDW 32.4 (H) 40.1 - 02.7 %   Platelets 388 150 - 450 x10E3/uL   Neutrophils 81 Not Estab. %   Lymphs 7 Not Estab. %   Monocytes 10 Not Estab. %   Eos 1 Not Estab. %   Basos 1 Not Estab. %   Neutrophils Absolute 9.6 (H) 1.4 - 7.0 x10E3/uL   Lymphocytes Absolute 0.8 0.7 - 3.1 x10E3/uL   Monocytes Absolute 1.2 (H) 0.1 - 0.9 x10E3/uL   EOS (ABSOLUTE) 0.2 0.0 - 0.4 x10E3/uL   Basophils Absolute 0.1 0.0 - 0.2 x10E3/uL   Immature Granulocytes 0 Not Estab. %   Immature Grans (Abs) 0.0 0.0 - 0.1 x10E3/uL  D-dimer, quantitative     Status: Abnormal   Collection Time: 07/27/23 11:12 AM  Result Value Ref Range   D-DIMER 0.65 (H) 0.00 - 0.49 mg/L FEU    Comment: According to the assay manufacturer's published package insert, a normal (<0.50 mg/L FEU) D-dimer result in conjunction with a non-high clinical probability assessment, excludes deep vein thrombosis (DVT) and pulmonary embolism (PE) with high sensitivity. D-dimer values increase with age and this can make VTE exclusion of an older population difficult. To address this, the [employer], based on best available evidence and recent  guidelines, recommends that clinicians use age-adjusted D-dimer thresholds in patients greater than 35 years of age with: a) a low probability of PE who do not meet all Pulmonary Embolism Rule Out Criteria, or b) in those with intermediate probability of PE. The formula for an age-adjusted D-dimer cut-off is "age/100". For example, a 78 year old patient would have an age-adjusted cut-off of 0.60 mg/L FEU and an 78 year old 0.80 mg/L FEU.          has a past medical history of Arthritis, Barrett's esophagus, Diabetes mellitus, GERD (gastroesophageal reflux disease), and Hypercholesteremia.   reports that he quit smoking about 24 years ago. His smoking use included cigarettes. He started smoking about 60 years ago. He has  a 72 pack-year smoking history. He has never used smokeless tobacco.  Past Surgical History:  Procedure Laterality Date   BACK SURGERY  2000   CHOLECYSTECTOMY     COLONOSCOPY  09/15/2011   Dr. Jena Gauss: Suboptimal prep. Pancolonic diverticulosis. Tubular adenomas. Next colonoscopy 5 years.   COLONOSCOPY N/A 09/22/2016   Surgeon: Corbin Ade, MD; pancolonic diverticulosis, otherwise normal exam.  Recommended repeat in 5 years.   COLONOSCOPY WITH PROPOFOL N/A 09/29/2021   Procedure: COLONOSCOPY WITH PROPOFOL;  Surgeon: Corbin Ade, MD;  [facility]: [facility];  Service: Endoscopy;  Laterality: N/A;  9:00am   ESOPHAGOGASTRODUODENOSCOPY N/A 08/31/2017   Surgeon: Corbin Ade, MD;  Barrett's esophagus with low-grade dysplasia, small hiatal hernia with abnormal gastric mucosa biopsied (no H. pylori), normal examined duodenum.     ESOPHAGOGASTRODUODENOSCOPY  11/2018   [facility]; Barrett's esophagus, negative for dysplasia   ESOPHAGOGASTRODUODENOSCOPY  01/2020   [facility]; Barrett's esophagus, indefinite for focal low-grade dysplasia.   GIVENS CAPSULE STUDY N/A 10/26/2017   Surgeon: Corbin Ade, MD; localized clustered areas of white "blunted appearing" abnormal small bowel  mucosa/villi of uncertain significance.  Follow-up CTE and small bowel enteroscopy at [facility] without significant findings.    GIVENS CAPSULE STUDY N/A 05/15/2023   Procedure: GIVENS CAPSULE STUDY;  Surgeon: Corbin Ade, MD;  Location: [facility];  Service: Endoscopy;  Laterality: N/A;  730am   HERNIA REPAIR     as a child   left cornea transplant Left    POLYPECTOMY  09/29/2021   Procedure: POLYPECTOMY;  Surgeon: Corbin Ade, MD;  Location: [facility];  Service: Endoscopy;;   Right cornea transplant     SMALL BOWEL ENTEROSCOPY  11/2017   [facility]; Barrett's esophagus focally positive for low-grade dysplasia, normal stomach, normal duodenum, normal jejunum.  Small bowel biopsies were benign.   TONSILLECTOMY      No Known Allergies  Immunization History  Administered Date(s) Administered   Fluad Quad(high Dose 65+) 10/07/2019, 11/22/2021   Influenza-Unspecified 10/29/2017, 08/20/2023   Moderna SARS-COV2 Booster Vaccination 01/10/2021   Moderna Sars-Covid-2 Vaccination 02/26/2020, 03/30/2020   Tdap 01/23/2017    Family History  Problem Relation Age of Onset   Heart disease Mother    Heart disease Father    Emphysema Father        smoked   Lung cancer Father        smoked   Colon cancer Neg Hx      Current Outpatient Medications:    acetaminophen (TYLENOL) 500 MG tablet, Take 1,000 mg by mouth every 6 (six) hours as needed (headaches/pain.)., Disp: , Rfl:    aspirin EC 81 MG tablet, Take 81 mg by mouth every evening. Swallow whole., Disp: , Rfl:    atorvastatin (LIPITOR) 10 MG tablet, Take 10 mg by mouth at bedtime., Disp: , Rfl:    Budeson-Glycopyrrol-Formoterol (BREZTRI AEROSPHERE) 160-9-4.8 MCG/ACT AERO, Inhale 2 puffs into the lungs in the morning and at bedtime., Disp: , Rfl:    budesonide-formoterol (SYMBICORT) 160-4.5 MCG/ACT inhaler, Inhale 2 puffs into the lungs in the morning and at bedtime., Disp: 1 each, Rfl: 3   chlorpheniramine (CHLOR-TRIMETON) 4 MG tablet,  Take 4 mg by mouth in the morning and at bedtime., Disp: , Rfl:    cyanocobalamin (VITAMIN B12) 1000 MCG tablet, Take 1,000 mcg by mouth daily., Disp: , Rfl:    famotidine (PEPCID) 20 MG tablet, Take 20 mg by mouth daily after supper. Takes after supper, Disp: , Rfl:  Ferrous Gluconate (IRON 27 PO), Take 1 tablet by mouth daily., Disp: , Rfl:    furosemide (LASIX) 20 MG tablet, Take 1 tablet (20 mg total) by mouth daily., Disp: 30 tablet, Rfl: 2   gabapentin (NEURONTIN) 300 MG capsule, Take 1 capsule (300 mg total) by mouth 2 (two) times daily., Disp: , Rfl:    glipiZIDE (GLUCOTROL XL) 5 MG 24 hr tablet, Take 5 mg by mouth daily with breakfast., Disp: , Rfl:    Ipratropium-Albuterol (COMBIVENT RESPIMAT) 20-100 MCG/ACT AERS respimat, Inhale 1 puff into the lungs every 6 (six) hours as needed for wheezing or shortness of breath., Disp: 4 g, Rfl: 2   LANTUS SOLOSTAR 100 UNIT/ML Solostar Pen, Inject 45 Units into the skin in the morning., Disp: , Rfl:    losartan (COZAAR) 50 MG tablet, Take 50 mg by mouth in the morning., Disp: , Rfl:    metFORMIN (GLUCOPHAGE) 1000 MG tablet, Take 1,000 mg by mouth 2 (two) times daily., Disp: , Rfl:    NOVOLOG FLEXPEN 100 UNIT/ML FlexPen, Inject 6 Units into the skin 2 (two) times daily., Disp: , Rfl:    pantoprazole (PROTONIX) 40 MG tablet, TAKE 1 TABLET BY MOUTH ONE TIME DAILY 30-60 MINUTES BEFORE BREAKFAST, Disp: 90 tablet, Rfl: 3   potassium chloride SA (KLOR-CON M) 20 MEQ tablet, Take 1 tablet (20 mEq total) by mouth daily., Disp: 30 tablet, Rfl: 1      Objective:   Vitals:   09/03/23 0858  BP: 116/74  Temp: 97.7 F (36.5 C)  TempSrc: Oral  SpO2: 90%  Weight: 171 lb (77.6 kg)  Height: 5\' 6"  (1.676 m)    Estimated body mass index is 27.6 kg/m as calculated from the following:   Height as of this encounter: 5\' 6"  (1.676 m).   Weight as of this encounter: 171 lb (77.6 kg).  @WEIGHTCHANGE @  [employer]   09/03/23 0858  Weight: 171 lb (77.6 kg)      Physical Exam   General: No distress. Looks well but pale O2 at rest: yes Cane present: no Sitting in wheel chair: no Frail: no Obese: no Neuro: Alert and Oriented x 3. GCS 15. Speech normal Psych: Pleasant Resp:  Barrel Chest - no.  Wheeze - no, Crackles -perhaps some mild basal crackles but I am not sure, No overt respiratory distress CVS: Normal heart sounds. Murmurs - no Ext: Stigmata of Connective Tissue Disease - no HEENT: Normal upper airway. PEERL +. No post nasal drip        Assessment:       ICD-10-CM   1. ILD (interstitial lung disease) (HCC)  J84.9 Pulmonary function test    Sed Rate (ESR)    ANA+ENA+DNA/DS+Scl 70+SjoSSA/B    CK Total (and CKMB)    QuantiFERON-TB Gold Plus    Hypersensitivity Pneumonitis    Hepatic function panel    Aldolase    Rheumatoid Factor    EKG 12-Lead    CBC w/Diff    Basic Metabolic Panel (BMET)    Basic Metabolic Panel (BMET)    CBC w/Diff    Rheumatoid Factor    Aldolase    Hepatic function panel    Hypersensitivity Pneumonitis    QuantiFERON-TB Gold Plus    CK Total (and CKMB)    ANA+ENA+DNA/DS+Scl 70+SjoSSA/B    Sed Rate (ESR)    2. Dyspnea on exertion  R06.09 Ambulatory referral to Cardiology    3. Coronary artery calcification seen on CAT scan  I25.10 Ambulatory  referral to Cardiology    4. Tachycardia  R00.0 Ambulatory referral to Cardiology    5. PVC (premature ventricular contraction)  I49.3 Ambulatory referral to Cardiology     ILD: Age greater than 70 Caucasian ethnicity, ILD with craniocaudal gradient and previous ILD changes in 2022 in 2023 and now significantly more suggest ILD flareup.  Very likely IPF.  However he does have Barrett's esophagus and wonder if this could also reflect some aspiration episodes.  Will do an ILD workup that includes ESR serology ILD questionnaire and then reassess.  Also give him some counseling around antifibrotic's which would be indicated anyway because of the  progression.   His tachycardia is probably related to his lung disease.  He does have PVCs he has coronary artery calcification.  Will refer him to cardiology.    Plan:     Patient Instructions     ICD-10-CM   1. ILD (interstitial lung disease) (HCC)  J84.9 Pulmonary function test    Sed Rate (ESR)    ANA+ENA+DNA/DS+Scl 70+SjoSSA/B    CK Total (and CKMB)    QuantiFERON-TB Gold Plus    Hypersensitivity Pneumonitis    Hepatic function panel    Aldolase    Rheumatoid Factor    EKG 12-Lead    CBC w/Diff    Basic Metabolic Panel (BMET)    2. Dyspnea on exertion  R06.09     3. Coronary artery calcification seen on CAT scan  I25.10     4. Tachycardia  R00.0      ILD   - I am concerned you  have Interstitial Lung Disease (ILD)  -  There are MANY varieties of this - To narrow down possibilities and assess severity please do the following  PLAN  - take ILD questionnaire packet with you and bring it back at next visit - do spirometry and dlco test (no BD response, no lung volumes; choose a location depending on schedule and convenience)  - ca be in [place]  - do walking test OR SIT/STAND test on room air in the office (not 6 min walk test)  - - do following blood work - autoimmune panel: Serum: ESR, ANA, DS-DNA, RF, anti-CCP, ssA, ssB, scl-70, Total CK,  Aldolase,   - do blood LFT, CBC, BMET - do serum Hypersensitivity Pneumonitis Panel   - do blood  Quantiferon Gold   - continue o2 - 3-4L [place] (hard to get accurate reading)   - refer Chesley Mires for counseling on esbriet v ofev   - next visit we will consider starting on one of these medicines   Tachycardia - Sinus 122/min - likey due to lung disease Coronary ARtery calcification On CT Scan PVC     Plan  - refer [facility] in [place] - any chest pain go to ER  Followup - next 4-6 weeks to discuss next steps   FOLLOWUP Return in about 5 weeks (around 10/08/2023) for 30 min visit, ILD, with Dr  Marchelle Gearing.  ( Level 05 visit E&M 2024: Estb >= 40 min    in  visit type: on-site physical face to visit  in total care time and counseling or/and coordination of care by this undersigned MD - Dr Kalman Shan. This includes one or more of the following on this same day 09/03/2023: pre-charting, chart review, note writing, documentation discussion of test results, diagnostic or treatment recommendations, prognosis, risks and benefits of management options, instructions, education, compliance or risk-factor reduction. It excludes time spent by the  CMA or office staff in the care of the patient. Actual time 45 min)   SIGNATURE    Dr. Kalman Shan, M.D., F.C.C.P,  Pulmonary and Critical Care Medicine Staff Physician, [facility] Center Director - Interstitial Lung Disease  Program  Pulmonary Fibrosis [facility] at [facility] [place], [place], [address]  Pager: [phone], If no answer or between  15:00h - 7:00h: call [phone] Telephone: [phone]  9:50 AM 09/03/2023

## 2023-09-03 NOTE — Progress Notes (Signed)
Pt seen by MR 09/03/23

## 2023-09-03 NOTE — Patient Instructions (Addendum)
ICD-10-CM   1. ILD (interstitial lung disease) (HCC)  J84.9 Pulmonary function test    Sed Rate (ESR)    ANA+ENA+DNA/DS+Scl 70+SjoSSA/B    CK Total (and CKMB)    QuantiFERON-TB Gold Plus    Hypersensitivity Pneumonitis    Hepatic function panel    Aldolase    Rheumatoid Factor    EKG 12-Lead    CBC w/Diff    Basic Metabolic Panel (BMET)    2. Dyspnea on exertion  R06.09     3. Coronary artery calcification seen on CAT scan  I25.10     4. Tachycardia  R00.0      ILD   - I am concerned you  have Interstitial Lung Disease (ILD)  -  There are MANY varieties of this - To narrow down possibilities and assess severity please do the following  PLAN  - take ILD questionnaire packet with you and bring it back at next visit - do spirometry and dlco test (no BD response, no lung volumes; choose a location depending on schedule and convenience)  - ca be in [place]  - do walking test OR SIT/STAND test on room air in the office (not 6 min walk test)  - - do following blood work - autoimmune panel: Serum: ESR, ANA, DS-DNA, RF, anti-CCP, ssA, ssB, scl-70, Total CK,  Aldolase,   - do blood LFT, CBC, BMET - do serum Hypersensitivity Pneumonitis Panel   - do blood  Quantiferon Gold   - continue o2 - 3-4L [place] (hard to get accurate reading)   - refer Chesley Mires for counseling on esbriet v ofev   - next visit we will consider starting on one of these medicines   Tachycardia - Sinus 122/min - likey due to lung disease Coronary ARtery calcification On CT Scan PVC     Plan  - refer [facility] in [place] - any chest pain go to ER  Followup - next 4-6 weeks to discuss next steps

## 2023-09-07 LAB — ANA+ENA+DNA/DS+SCL 70+SJOSSA/B
ANA Titer 1: NEGATIVE
ENA RNP Ab: <0.2 AI (ref 0.0–0.9)
ENA SM Ab Ser-aCnc: <0.2 AI (ref 0.0–0.9)
ENA SSA (RO) Ab: <0.2 AI (ref 0.0–0.9)
ENA SSB (LA) Ab: <0.2 AI (ref 0.0–0.9)
Scleroderma (Scl-70) (ENA) Antibody, IgG: <0.2 AI (ref 0.0–0.9)
dsDNA Ab: 1 [IU]/mL (ref 0–9)

## 2023-09-07 LAB — HYPERSENSITIVITY PNEUMONITIS
A. Pullulans Abs: NEGATIVE
A.Fumigatus #1 Abs: NEGATIVE
Micropolyspora faeni, IgG: NEGATIVE
Pigeon Serum Abs: NEGATIVE
Thermoact. Saccharii: NEGATIVE
Thermoactinomyces vulgaris, IgG: NEGATIVE

## 2023-09-11 LAB — QUANTIFERON-TB GOLD PLUS
Mitogen-NIL: 2.82 [IU]/mL
NIL: 0.03 [IU]/mL
QuantiFERON-TB Gold Plus: NEGATIVE
TB1-NIL: <0 [IU]/mL
TB2-NIL: <0 [IU]/mL

## 2023-09-11 LAB — CK TOTAL AND CKMB (NOT AT ARMC)
CK, MB: 1.9 ng/mL (ref 0–5.0)
Relative Index: 1.9 (ref 0–4.0)
Total CK: 101 U/L (ref 44–196)

## 2023-09-11 LAB — RHEUMATOID FACTOR: Rheumatoid fact SerPl-aCnc: <10 [IU]/mL (ref <14)

## 2023-09-11 LAB — ALDOLASE: Aldolase: 5.9 U/L (ref <=8.1)

## 2023-09-21 ENCOUNTER — Encounter: Payer: Self-pay | Admitting: Internal Medicine

## 2023-09-21 ENCOUNTER — Ambulatory Visit: Payer: Medicare HMO | Attending: Internal Medicine | Admitting: Internal Medicine

## 2023-09-21 ENCOUNTER — Ambulatory Visit: Payer: Medicare HMO

## 2023-09-21 ENCOUNTER — Other Ambulatory Visit: Payer: Self-pay | Admitting: Internal Medicine

## 2023-09-21 ENCOUNTER — Telehealth: Payer: Self-pay | Admitting: Internal Medicine

## 2023-09-21 VITALS — BP 128/74 | HR 61 | Ht 66.0 in | Wt 170.4 lb

## 2023-09-21 DIAGNOSIS — Z01810 Encounter for preprocedural cardiovascular examination: Secondary | ICD-10-CM | POA: Diagnosis not present

## 2023-09-21 DIAGNOSIS — R000 Tachycardia, unspecified: Secondary | ICD-10-CM

## 2023-09-21 DIAGNOSIS — R0609 Other forms of dyspnea: Secondary | ICD-10-CM

## 2023-09-21 DIAGNOSIS — I2510 Atherosclerotic heart disease of native coronary artery without angina pectoris: Secondary | ICD-10-CM

## 2023-09-21 MED ORDER — IVABRADINE HCL 7.5 MG PO TABS: ORAL_TABLET | ORAL | 0 refills | Status: DC

## 2023-09-21 NOTE — Patient Instructions (Addendum)
Medication Instructions:  Your physician recommends that you continue on your current medications as directed. Please refer to the Current Medication list given to you today.   Labwork: BMET to be completed 1-2 weeks prior to CTA  Testing/Procedures:   Your cardiac CT will be scheduled at one of the below locations:   [facility] [address] [place], [place] [address] [phone]  If scheduled at [facility], please arrive at the [facility] and Children's Entrance (Entrance C2) of [facility] 30 minutes prior to test start time. You can use the FREE valet parking offered at entrance C (encouraged to control the heart rate for the test)  Proceed to the [facility] Radiology Department (first floor) to check-in and test prep.  All radiology patients and guests should use entrance C2 at [facility], accessed from [facility], even though the hospital's physical address listed is [address]     There is spacious parking and easy access to the radiology department from the [facility] entrance. Please enter here and check-in with the desk attendant.   Please follow these instructions carefully (unless otherwise directed):  An IV will be required for this test and Nitroglycerin will be given.  On the Night Before the Test: Be sure to Drink plenty of water. Do not consume any caffeinated/decaffeinated beverages or chocolate 12 hours prior to your test. Do not take any antihistamines 12 hours prior to your test. If the patient has contrast allergy: No allergy  On the Day of the Test: Drink plenty of water until 1 hour prior to the test. Do not eat any food 1 hour prior to test. You may take your regular medications prior to the test.  Take Ivabradine 7.5 mg the night before and two hours prior to test. If you take Furosemide and Potassium Chloride please HOLD on the morning of the test.    After the  Test: Drink plenty of water. After receiving IV contrast, you may experience a mild flushed feeling. This is normal. On occasion, you may experience a mild rash up to 24 hours after the test. This is not dangerous. If this occurs, you can take Benadryl 25 mg and increase your fluid intake. If you experience trouble breathing, this can be serious. If it is severe call 911 IMMEDIATELY. If it is mild, please call our office. If you take any of these medications: Glipizide/Metformin, Lantus and Novolog, please do not take 48 hours after completing test unless otherwise instructed.  We will call to schedule your test 2-4 weeks out understanding that some insurance companies will need an authorization prior to the service being performed.   For more information and frequently asked questions, please visit our website : [email or web address]  For non-scheduling related questions, please contact the cardiac imaging nurse navigator should you have any questions/concerns: Cardiac Imaging Nurse Navigators Direct Office Dial: [phone]   For scheduling needs, including cancellations and rescheduling, please call [place], [phone].   Follow-Up: Your physician recommends that you schedule a follow-up appointment in: 6 months  Any Other Special Instructions Will Be Listed Below (If Applicable).  Thank you for choosing [facility]!      If you need a refill on your cardiac medications before your next appointment, please call your pharmacy.

## 2023-09-21 NOTE — Progress Notes (Signed)
Cardiology Office Note  Date: 09/21/2023   ID: Christian Woodard, DOB [birth year], MRN [id number]  PCP:  Carylon Perches, MD  Cardiologist:  Marjo Bicker, MD Electrophysiologist:  None   History of Present Illness: Christian Woodard is a 77 y.o. male known to have ILD was referred to cardiology clinic for evaluation of DOE.   Ongoing DOE for the last 6 months, no orthopnea, PND, leg swelling.  No angina, presyncope, syncope.  Has ILD on home O2.  Has intermittent elevated heart rates.  EKG performed at his pulmonologist office showed sinus tachycardia.  Echocardiogram performed 04/2023 showed normal LVEF, indeterminate diastology due to E and a fusion and no valvular heart disease.  No prior ischemia evaluation performed.  No prior CAD.  Past Medical History:  Diagnosis Date   Arthritis    Barrett's esophagus    With low-grade dysplasia, following with [facility]   Diabetes mellitus    GERD (gastroesophageal reflux disease)    Hypercholesteremia     Past Surgical History:  Procedure Laterality Date   BACK SURGERY  2000   CHOLECYSTECTOMY     COLONOSCOPY  09/15/2011   Dr. Jena Gauss: Suboptimal prep. Pancolonic diverticulosis. Tubular adenomas. Next colonoscopy 5 years.   COLONOSCOPY N/A 09/22/2016   Surgeon: Corbin Ade, MD; pancolonic diverticulosis, otherwise normal exam.  Recommended repeat in 5 years.   COLONOSCOPY WITH PROPOFOL N/A 09/29/2021   Procedure: COLONOSCOPY WITH PROPOFOL;  Surgeon: Corbin Ade, MD;  [facility];  Service: Endoscopy;  Laterality: N/A;  9:00am   ESOPHAGOGASTRODUODENOSCOPY N/A 08/31/2017   Surgeon: Corbin Ade, MD;  Barrett's esophagus with low-grade dysplasia, small hiatal hernia with abnormal gastric mucosa biopsied (no H. pylori), normal examined duodenum.     ESOPHAGOGASTRODUODENOSCOPY  11/2018   [facility]; Barrett's esophagus, negative for dysplasia   ESOPHAGOGASTRODUODENOSCOPY  01/2020   [facility]; Barrett's esophagus, indefinite for  focal low-grade dysplasia.   GIVENS CAPSULE STUDY N/A 10/26/2017   Surgeon: Corbin Ade, MD; localized clustered areas of white "blunted appearing" abnormal small bowel mucosa/villi of uncertain significance.  Follow-up CTE and small bowel enteroscopy at [facility] without significant findings.    GIVENS CAPSULE STUDY N/A 05/15/2023   Procedure: GIVENS CAPSULE STUDY;  Surgeon: Corbin Ade, MD;  Location: [facility];  Service: Endoscopy;  Laterality: N/A;  730am   HERNIA REPAIR     as a child   left cornea transplant Left    POLYPECTOMY  09/29/2021   Procedure: POLYPECTOMY;  Surgeon: Corbin Ade, MD;  Location: [facility];  Service: Endoscopy;;   Right cornea transplant     SMALL BOWEL ENTEROSCOPY  11/2017   [facility]; Barrett's esophagus focally positive for low-grade dysplasia, normal stomach, normal duodenum, normal jejunum.  Small bowel biopsies were benign.   TONSILLECTOMY      Current Outpatient Medications  Medication Sig Dispense Refill   acetaminophen (TYLENOL) 500 MG tablet Take 1,000 mg by mouth every 6 (six) hours as needed (headaches/pain.).     aspirin EC 81 MG tablet Take 81 mg by mouth every evening. Swallow whole.     atorvastatin (LIPITOR) 10 MG tablet Take 10 mg by mouth at bedtime.     Budeson-Glycopyrrol-Formoterol (BREZTRI AEROSPHERE) 160-9-4.8 MCG/ACT AERO Inhale 2 puffs into the lungs in the morning and at bedtime.     budesonide-formoterol (SYMBICORT) 160-4.5 MCG/ACT inhaler Inhale 2 puffs into the lungs in the morning and at bedtime. 1 each 3   chlorpheniramine (CHLOR-TRIMETON) 4  MG tablet Take 4 mg by mouth in the morning and at bedtime.     cyanocobalamin (VITAMIN B12) 1000 MCG tablet Take 1,000 mcg by mouth daily.     famotidine (PEPCID) 20 MG tablet Take 20 mg by mouth daily after supper. Takes after supper     Ferrous Gluconate (IRON 27 PO) Take 1 tablet by mouth daily.     furosemide (LASIX) 20 MG tablet Take 1 tablet (20 mg total) by mouth daily. 30  tablet 2   gabapentin (NEURONTIN) 300 MG capsule Take 1 capsule (300 mg total) by mouth 2 (two) times daily.     glipiZIDE (GLUCOTROL XL) 5 MG 24 hr tablet Take 5 mg by mouth daily with breakfast.     Ipratropium-Albuterol (COMBIVENT RESPIMAT) 20-100 MCG/ACT AERS respimat Inhale 1 puff into the lungs every 6 (six) hours as needed for wheezing or shortness of breath. 4 g 2   ivabradine (CORLANOR) 7.5 MG TABS tablet Take one tablet night before procedure and morning off 2 tablet 0   LANTUS SOLOSTAR 100 UNIT/ML Solostar Pen Inject 45 Units into the skin in the morning.     losartan (COZAAR) 50 MG tablet Take 50 mg by mouth in the morning.     metFORMIN (GLUCOPHAGE) 1000 MG tablet Take 1,000 mg by mouth 2 (two) times daily.     NOVOLOG FLEXPEN 100 UNIT/ML FlexPen Inject 6 Units into the skin 2 (two) times daily.     pantoprazole (PROTONIX) 40 MG tablet TAKE 1 TABLET BY MOUTH ONE TIME DAILY 30-60 MINUTES BEFORE BREAKFAST 90 tablet 3   potassium chloride SA (KLOR-CON M) 20 MEQ tablet Take 1 tablet (20 mEq total) by mouth daily. 30 tablet 1   No current facility-administered medications for this visit.   Allergies:  Patient has no known allergies.   Social History: The patient  reports that he quit smoking about 24 years ago. His smoking use included cigarettes. He started smoking about 60 years ago. He has a 72 pack-year smoking history. He has never used smokeless tobacco. He reports that he does not drink alcohol and does not use drugs.   Family History: The patient's family history includes Emphysema in his father; Heart disease in his father and mother; Lung cancer in his father.   ROS:  Please see the history of present illness. Otherwise, complete review of systems is positive for none  All other systems are reviewed and negative.   Physical Exam: VS:  BP 128/74   Pulse 61   Ht 5\' 6"  (1.676 m)   Wt 170 lb 6.4 oz (77.3 kg)   SpO2 96%   BMI 27.50 kg/m , BMI Body mass index is 27.5  kg/m.  Wt Readings from Last 3 Encounters:  09/21/23 170 lb 6.4 oz (77.3 kg)  09/03/23 171 lb (77.6 kg)  07/27/23 169 lb (76.7 kg)    General: Patient appears comfortable at rest. HEENT: Conjunctiva and lids normal, oropharynx clear with moist mucosa. Neck: Supple, no elevated JVP or carotid bruits, no thyromegaly. Lungs: Clear to auscultation, nonlabored breathing at rest. Cardiac: Regular rate and rhythm, no S3 or significant systolic murmur, no pericardial rub. Abdomen: Soft, nontender, no hepatomegaly, bowel sounds present, no guarding or rebound. Extremities: No pitting edema, distal pulses 2+. Skin: Warm and dry. Musculoskeletal: No kyphosis. Neuropsychiatric: Alert and oriented x3, affect grossly appropriate.  Recent Labwork: 07/27/2023: BNP 75.5 09/03/2023: ALT 19; AST 24; BUN 24; Creatinine, Ser 1.17; Hemoglobin 12.9; Platelets 410.0; Potassium 4.2; Sodium  138  No results found for: "CHOL", "TRIG", "HDL", "CHOLHDL", "VLDL", "LDLCALC", "LDLDIRECT"    Assessment and Plan:  DOE HTN, controlled Sinus tachycardia, intermittent likely secondary to hypoxia ILD on home O2   -Ongoing DOE x 6 months, no orthopnea or PND.  No leg swelling.  No angina.  Echo from 04/2023 showed normal LVEF, no valvular heart disease, CVP 8 mmHg.  Diastology indeterminate due to E-A fusion.  Obtain CT cardiac, will administer ivabradine 7.5 mg on the night before and on the day before the CT imaging.  Obtain 2-week event monitor to rule out any atrial arrhythmias as CTS have intermittent sinus tachycardia likely secondary to hypoxia. -Follows with pulmonology for ILD management. -Continue current antihypertensives, losartan 50 mg once daily.  I spent a total duration of 45 minutes reviewing the prior notes, labs, EKG, face-to-face discussion/counseling of his medical condition, pathophysiology, evaluation, management, ordering test and documenting the findings in the note.  Medication  Adjustments/Labs and Tests Ordered: Current medicines are reviewed at length with the patient today.  Concerns regarding medicines are outlined above.    Disposition:  Follow up  6 months  Signed Markeem Noreen Verne Spurr, MD, 09/21/2023 3:13 PM    [facility] at [facility] [address] [place], [place], [place] [address]

## 2023-09-21 NOTE — Telephone Encounter (Signed)
Checking percert on the following   2 week ZIO XT nn.

## 2023-09-25 ENCOUNTER — Telehealth: Payer: Self-pay | Admitting: Internal Medicine

## 2023-09-25 NOTE — Telephone Encounter (Signed)
Pt's spouse calling stating that pt has a monitor that is to worn until 11/8 and also has a CT scheduled on the same day and is wanting to know when to take monitor off. Requesting cb

## 2023-09-25 NOTE — Telephone Encounter (Signed)
Notified wife Elease Hashimoto) that it will be okay for him to go ahead and remove a few hours early as 11/18 will be the 14th day anyway.  She verbalized understanding.

## 2023-09-28 DIAGNOSIS — R0609 Other forms of dyspnea: Secondary | ICD-10-CM | POA: Diagnosis not present

## 2023-09-28 DIAGNOSIS — Z01810 Encounter for preprocedural cardiovascular examination: Secondary | ICD-10-CM | POA: Diagnosis not present

## 2023-09-29 LAB — BASIC METABOLIC PANEL
BUN/Creatinine Ratio: 15 (ref 10–24)
BUN: 18 mg/dL (ref 8–27)
CO2: 23 mmol/L (ref 20–29)
Calcium: 10.2 mg/dL (ref 8.6–10.2)
Chloride: 99 mmol/L (ref 96–106)
Creatinine, Ser: 1.19 mg/dL (ref 0.76–1.27)
Glucose: 109 mg/dL — ABNORMAL HIGH (ref 70–99)
Potassium: 5 mmol/L (ref 3.5–5.2)
Sodium: 139 mmol/L (ref 134–144)
eGFR: 63 mL/min/{1.73_m2} (ref >59)

## 2023-10-04 ENCOUNTER — Telehealth (HOSPITAL_COMMUNITY): Payer: Self-pay | Admitting: *Deleted

## 2023-10-04 NOTE — Telephone Encounter (Signed)
Attempted to call patient regarding upcoming cardiac CT appointment. Left message on voicemail with name and callback number Hayley Sharpe RN Navigator Cardiac Imaging [facility] [phone] Office   

## 2023-10-05 ENCOUNTER — Ambulatory Visit (HOSPITAL_COMMUNITY)
Admission: RE | Admit: 2023-10-05 | Discharge: 2023-10-05 | Disposition: A | Discharge status: Home / Self Care | Payer: Medicare HMO | Source: Ambulatory Visit | Attending: Internal Medicine | Admitting: Internal Medicine

## 2023-10-05 ENCOUNTER — Encounter (HOSPITAL_COMMUNITY): Payer: Self-pay

## 2023-10-05 DIAGNOSIS — R0609 Other forms of dyspnea: Secondary | ICD-10-CM

## 2023-10-05 DIAGNOSIS — I2510 Atherosclerotic heart disease of native coronary artery without angina pectoris: Secondary | ICD-10-CM

## 2023-10-08 DIAGNOSIS — Z947 Corneal transplant status: Secondary | ICD-10-CM | POA: Diagnosis not present

## 2023-10-08 DIAGNOSIS — H16223 Keratoconjunctivitis sicca, not specified as Sjogren's, bilateral: Secondary | ICD-10-CM | POA: Diagnosis not present

## 2023-10-08 DIAGNOSIS — E119 Type 2 diabetes mellitus without complications: Secondary | ICD-10-CM | POA: Diagnosis not present

## 2023-10-08 DIAGNOSIS — H40023 Open angle with borderline findings, high risk, bilateral: Secondary | ICD-10-CM | POA: Diagnosis not present

## 2023-10-08 DIAGNOSIS — H18623 Keratoconus, unstable, bilateral: Secondary | ICD-10-CM | POA: Diagnosis not present

## 2023-10-12 ENCOUNTER — Other Ambulatory Visit: Payer: Self-pay

## 2023-10-12 DIAGNOSIS — D509 Iron deficiency anemia, unspecified: Secondary | ICD-10-CM

## 2023-10-17 ENCOUNTER — Telehealth: Payer: Self-pay | Admitting: Internal Medicine

## 2023-10-17 ENCOUNTER — Telehealth: Payer: Self-pay

## 2023-10-17 DIAGNOSIS — R0609 Other forms of dyspnea: Secondary | ICD-10-CM

## 2023-10-17 NOTE — Telephone Encounter (Signed)
Wife stated she was returned Natalia's call.

## 2023-10-17 NOTE — Telephone Encounter (Signed)
Spoke with wife per DPR. Patient informed and verbalized understanding of plan. Also that the instructions for stress test will be on his MyChart and will receive a call from schedulers with a time and date.

## 2023-10-17 NOTE — Telephone Encounter (Signed)
-----   Message from Vishnu P Mallipeddi sent at 10/17/2023 12:58 PM EST ----- Regarding: RE: CCTA aborted, next steps? Cancel CCTA.  Gwen Her, obtain [facility] for DOE diagnosis. ----- Message ----- From: Lennie Odor, RN Sent: 10/17/2023  12:47 PM EST To: Dorette Grate, RN; Harvel Ricks, RN; # Subject: CCTA aborted, next steps?                      Good afternoon Dr Jenene Slicker,  This patient arrived for CCTA however test could not be completed due to low BP and high HR despite taking ivabradine prior to the scan.   Please advise how you would like to proceed. Reschedule CCTA with different pre-med strategy? (Unsure how to overcome the low BP) Or other test modality?   Thank you, Rockwell Alexandria RN Navigator Cardiac Imaging [facility] [phone] Office  [phone] Cell

## 2023-10-18 ENCOUNTER — Ambulatory Visit: Payer: Medicare HMO | Admitting: Internal Medicine

## 2023-10-18 ENCOUNTER — Other Ambulatory Visit: Payer: Medicare HMO | Admitting: Pharmacist

## 2023-10-19 ENCOUNTER — Telehealth: Payer: Self-pay | Admitting: Internal Medicine

## 2023-10-19 NOTE — Telephone Encounter (Signed)
Checking percert on the following patient for testing scheduled at [facility].   LEXISCAN 10/30/2023

## 2023-10-22 DIAGNOSIS — D509 Iron deficiency anemia, unspecified: Secondary | ICD-10-CM | POA: Diagnosis not present

## 2023-10-23 LAB — CBC WITH DIFFERENTIAL/PLATELET
Basophils Absolute: 0.1 10*3/uL (ref 0.0–0.2)
Basos: 1 %
EOS (ABSOLUTE): 0.1 10*3/uL (ref 0.0–0.4)
Eos: 1 %
Hematocrit: 40.6 % (ref 37.5–51.0)
Hemoglobin: 12.9 g/dL — ABNORMAL LOW (ref 13.0–17.7)
Immature Grans (Abs): 0.1 10*3/uL (ref 0.0–0.1)
Immature Granulocytes: 1 %
Lymphocytes Absolute: 1 10*3/uL (ref 0.7–3.1)
Lymphs: 9 %
MCH: 28.3 pg (ref 26.6–33.0)
MCHC: 31.8 g/dL (ref 31.5–35.7)
MCV: 89 fL (ref 79–97)
Monocytes Absolute: 1.3 10*3/uL — ABNORMAL HIGH (ref 0.1–0.9)
Monocytes: 11 %
Neutrophils Absolute: 8.7 10*3/uL — ABNORMAL HIGH (ref 1.4–7.0)
Neutrophils: 77 %
Platelets: 418 10*3/uL (ref 150–450)
RBC: 4.56 x10E6/uL (ref 4.14–5.80)
RDW: 14.5 % (ref 11.6–15.4)
WBC: 11.2 10*3/uL — ABNORMAL HIGH (ref 3.4–10.8)

## 2023-10-23 LAB — IRON,TIBC AND FERRITIN PANEL
Ferritin: 45 ng/mL (ref 30–400)
Iron Saturation: 29 % (ref 15–55)
Iron: 90 ug/dL (ref 38–169)
Total Iron Binding Capacity: 312 ug/dL (ref 250–450)
UIBC: 222 ug/dL (ref 111–343)

## 2023-10-24 DIAGNOSIS — R000 Tachycardia, unspecified: Secondary | ICD-10-CM | POA: Diagnosis not present

## 2023-10-28 NOTE — Progress Notes (Unsigned)
Subjective:    Patient ID: Christian Woodard, male   DOB: 30-Oct-1946     MRN: [id number]    Brief patient profile:  28 yowm quit smoking 2000 with mild doe @ wt 156  and did fine until around 2013 gave up pushing mower and gradually downhill since referred to pulmonary clinic 06/14/2017 by Dr   Christian Woodard   06/14/2017 1st [facility] office visit/ Christian Woodard  No resp rx  Chief Complaint  Patient presents with   Pulmonary Consult    Referred by Dr. Carylon Woodard. Pt c/o SOB x 6 months, esp worse over the past 3 months. He states he gets out of breath walking an incline, such as to his mailbox. He states that he sometimes he wakes up in the night feeling SOB.  He also c/o non prod cough.   doe x 5 years,  Stops at top of incline on way back to house x 6 months  = MMRC1 = can walk nl pace, flat grade, can't hurry or go uphills or steps s sob   Never tried inhalers Worse in heat and humbidity  freq wakes with dry cough / chest tightness sits up and goes away  rec You do not have significant copd at this point and likely never will  Pantoprazole (protonix) 40 mg   Take  30-60 min before first meal of the day and Pepcid (famotidine)  20 mg one @  bedtime until return to office - this is the best way to tell whether stomach acid is contributing to your problem.   GERD diet     08/20/2017  f/u ov/Christian Woodard re: doe on gerd rx/ gi eval in progress for anemia/ no resp rx  Chief Complaint  Patient presents with   Follow-up    PFT's done today. Increased SOB for the past day. Chest feels heavy today.  continues to have noct spells of sob despite nl BNP and no evidence of chf on cxr or obvious anemia Tells me his hgb still trending down and GI eval in progess rec You do not have any significant copd If you continue to have breathing issues after your anemia is corrected might consider trial off ACTOS next and /or cardiology evaluation    Admission date:  10/03/2019     Discharge Date:  10/07/2019    Discharge Diagnosis  AKI (acute kidney injury) (HCC) [N17.9] COVID-19 virus infection [U07.1]     Acute respiratory failure with hypoxia (HCC)   Acute respiratory disease due to COVID-19 virus   Type 2 diabetes mellitus without complication (HCC)   Essential hypertension      10/27/19 History of Present Illness: Dyspnea:  2 laps off 02 prior to discharge / trend is improving at home but not checking sats  Cough: minimal clear  Sleeping: flat/ one pillow SABA use: none 02: none  Dex last day 10/12/2019 rec Make sure you check your oxygen saturation at your highest level of activity to be sure it stays over 90% and keep track of it at least once a week, more often if breathing getting worse, and let me know if losing ground.>>> did not do       01/19/2021  f/u ov/[place] office/Christian Woodard re: doe p covid  Chief Complaint  Patient presents with   Follow-up    Breathing is overall doing well and no new co's today.    Dyspnea:  Up and down the hill to the mb daily feels it getting a little harder  than it was about a month ago since feet started bothering him with paresthesias and lack of balance  Cough: none/ some p eating assoc with mild dysphagia ? Barrett's  Sleeping: flat bed / R side / 1 pillow SABA use: none  02: none Covid status: vax x 3  rec Stay as active as you can and call if losing ground with exercise tolerance  Pulmonary follow up is as directed by Dr Christian Woodard  Late add:  Based on desats with no explanation and c/o worse ex tol > HRCT/pfts next step > no specific findings/ 7 mm nodule needs f/u @ 6-12 m     04/2023 started on 02    06/29/2023  post hosp f/u ov/[place] office/Christian Woodard re: doe/ cough p covid 09/2019 maint on symbicort 160  better since admit  Chief Complaint  Patient presents with   Shortness of Breath  Dyspnea:  has not  tried mailbox yet, just room to room and not titraitng 02  Cough: no change baseline  Sleeping: bed is flat/ high pillow under head   SABA use: combivent  02: 4lpm cont since admit  Rec Continue 4lpm at bedtime for now  Make sure you check your oxygen saturation  AT  your highest level of activity (not after you stop)   to be sure it stays over 90%   Please schedule a follow up office visit in 4 weeks, sooner if needed  with all medications /inhalers/ solutions in hand    07/27/2023  f/u ov/[place] office/Christian Woodard re: post covid resp failure maint on symbicort/ prn combivent  f/u hrct done 07/23/23  Chief Complaint  Patient presents with   Dyspnea on exertion  Dyspnea:  last mailbox trip was prior to June 2024 100 ft with hills - wife worried about his balance   Cough: not a problem  Sleeping: bed is flat s  resp cc  SABA use:  not since last ov  02: 4lpm cont s humidity  Rec My office will be contacting you by phone for referral to [employer] for best fit evaluation for portable 02 and humdity for 02   Try breztri (or symbicort)  Take 2 puffs first thing in am and then another 2 puffs about 12 hours later.   Work on inhaler technique:   >>>  Remember how golfers warm up by taking practice swings - do this with an empty inhaler  I emphasized that nasal steroids (Nasacort AQ) have no immediate benefit  Please schedule a follow up visit in 3 months but call sooner if needed   - HRCT 07/23/23  c/w mild progressive since  c/w Prob UIP > referred to PF clinic 07/30/2023    10/29/2023  f/u ov/[place] office/Christian Woodard re: hypoxemic resp failure  maint on 02 and symbicort   Chief Complaint  Patient presents with   Shortness of Breath  Dyspnea:  uses HC parking walks up a ramp walks to front of church on [place] 02   Cough: none  Sleeping: flat bed / 2 pillows  s  resp cc  SABA RUE:AVWU symbiocrt  02: 4 or 5 lpm/ same at rest     No obvious day to day or daytime variability or assoc excess/ purulent sputum or mucus plugs or hemoptysis or cp or chest tightness, subjective wheeze or overt sinus or hb symptoms.    Also denies any  obvious fluctuation of symptoms with weather or environmental changes or other aggravating or alleviating factors except as outlined above  No unusual exposure hx or h/o childhood pna/ asthma or knowledge of premature birth.  Current Allergies, Complete Past Medical History, Past Surgical History, Family History, and Social History were reviewed in [employer] record.  ROS  The following are not active complaints unless bolded Hoarseness, sore throat, dysphagia, dental problems, itching, sneezing,  nasal congestion or discharge of excess mucus or purulent secretions, ear ache,   fever, chills, sweats, unintended wt loss or wt gain, classically pleuritic or exertional cp,  orthopnea pnd or arm/hand swelling  or leg swelling, presyncope, palpitations, abdominal pain, anorexia, nausea, vomiting, diarrhea  or change in bowel habits or change in bladder habits, change in stools or change in urine, dysuria, hematuria,  rash, arthralgias, visual complaints, headache, numbness, weakness or ataxia or problems with walking or coordination,  change in mood or  memory.        Current Meds  Medication Sig   acetaminophen (TYLENOL) 500 MG tablet Take 1,000 mg by mouth every 6 (six) hours as needed (headaches/pain.).   aspirin EC 81 MG tablet Take 81 mg by mouth every evening. Swallow whole.   atorvastatin (LIPITOR) 10 MG tablet Take 10 mg by mouth at bedtime.   Budeson-Glycopyrrol-Formoterol (BREZTRI AEROSPHERE) 160-9-4.8 MCG/ACT AERO Inhale 2 puffs into the lungs in the morning and at bedtime.   budesonide-formoterol (SYMBICORT) 160-4.5 MCG/ACT inhaler Inhale 2 puffs into the lungs in the morning and at bedtime.   chlorpheniramine (CHLOR-TRIMETON) 4 MG tablet Take 4 mg by mouth in the morning and at bedtime.   cyanocobalamin (VITAMIN B12) 1000 MCG tablet Take 1,000 mcg by mouth daily.   famotidine (PEPCID) 20 MG tablet Take 20 mg by mouth daily after supper. Takes after supper    Ferrous Gluconate (IRON 27 PO) Take 1 tablet by mouth daily.   furosemide (LASIX) 20 MG tablet Take 1 tablet (20 mg total) by mouth daily.   gabapentin (NEURONTIN) 300 MG capsule Take 1 capsule (300 mg total) by mouth 2 (two) times daily.   glipiZIDE (GLUCOTROL XL) 5 MG 24 hr tablet Take 5 mg by mouth daily with breakfast.   Ipratropium-Albuterol (COMBIVENT RESPIMAT) 20-100 MCG/ACT AERS respimat Inhale 1 puff into the lungs every 6 (six) hours as needed for wheezing or shortness of breath.   LANTUS SOLOSTAR 100 UNIT/ML Solostar Pen Inject 45 Units into the skin in the morning.   losartan (COZAAR) 50 MG tablet Take 50 mg by mouth in the morning.   metFORMIN (GLUCOPHAGE) 1000 MG tablet Take 1,000 mg by mouth 2 (two) times daily.   NOVOLOG FLEXPEN 100 UNIT/ML FlexPen Inject 6 Units into the skin 2 (two) times daily.   pantoprazole (PROTONIX) 40 MG tablet TAKE 1 TABLET BY MOUTH ONE TIME DAILY 30-60 MINUTES BEFORE BREAKFAST   potassium chloride SA (KLOR-CON M) 20 MEQ tablet Take 1 tablet (20 mEq total) by mouth daily.               Objective:   Physical Exam   Wts  10/29/2023        171  07/27/2023        169   06/29/2023          168  05/18/2023        171  11/22/2021      174    08/18/2021       170  02/18/2021       175 01/19/2021       173   08/20/2017  179   06/14/17 184 lb (83.5 kg)  02/06/17 185 lb (83.9 kg)  01/24/17 184 lb (83.5 kg)   Vital signs reviewed  10/29/2023  - Note at rest 02 sats  78% on 3lpm pulsed   General appearance:    pale amb 02 NAD    HEENT : Oropharynx  clear          NECK :  without  apparent JVD/ palpable Nodes/TM    LUNGS: no acc muscle use,  Nl contour chest with minimal insp crackles in bases   CV:  RRR  no s3 or murmur or increase in P2, and no edema   ABD:  soft and nontender with nl inspiratory excursion in the supine position. No bruits or organomegaly appreciated   MS:  Nl gait/ ext warm without deformities Or obvious joint restrictions   calf tenderness, cyanosis - mild/mod clubbing    SKIN: warm and dry without lesions    NEURO:  alert, approp, nl sensorium with  no motor or cerebellar deficits apparent.       Assessment:

## 2023-10-29 ENCOUNTER — Encounter: Payer: Self-pay | Admitting: Internal Medicine

## 2023-10-29 ENCOUNTER — Ambulatory Visit: Payer: Medicare HMO | Admitting: Internal Medicine

## 2023-10-29 VITALS — BP 98/58 | HR 115 | Ht 66.0 in | Wt 171.0 lb

## 2023-10-29 DIAGNOSIS — J849 Interstitial pulmonary disease, unspecified: Secondary | ICD-10-CM

## 2023-10-29 DIAGNOSIS — J9611 Chronic respiratory failure with hypoxia: Secondary | ICD-10-CM | POA: Diagnosis not present

## 2023-10-29 DIAGNOSIS — R0609 Other forms of dyspnea: Secondary | ICD-10-CM

## 2023-10-29 DIAGNOSIS — J8410 Pulmonary fibrosis, unspecified: Secondary | ICD-10-CM

## 2023-10-29 NOTE — Patient Instructions (Addendum)
My office will be contacting you by phone for referral for best fit evaluation for portable 02 and humidity for your home 02   - if you don't hear back from my office within one week please call us back or notify us thru MyChart and we'll address it right away    Make sure you check your oxygen saturation  AT  your highest level of activity (not after you stop)   to be sure it stays around  90% and adjust  02 flow upward to maintain this level if needed but remember to turn it back to previous settings when you stop (to conserve your supply).    All follow up thru Dr Marchelle Gearing -  see [place] here as needed

## 2023-10-29 NOTE — Assessment & Plan Note (Signed)
Onset  2013 p quit smoking in 2000  Spirometry 06/14/2017  FEV1 1.91 (70%)  Ratio 73 mild curvature  -  06/14/2017  Walked RA x 3 laps @ 185 ft each stopped due to  End of study, sats 89% and sob @ fast pace  - Allergy profile 06/14/17 >  Eos 0.1/  IgE    38 neg RAST  - PFT's  08/20/2017  FEV1 2.23 (84 % ) ratio 76  p 2 % improvement from saba p nothing prior to study with DLCO  45/55c % corrects to 78 % for alv volume   - Flared with COVID19 pna 09/2019  -  01/19/2021   Walked RA  approx   300 ft  @ fast pace  stopped due to  sats 86% min sob    - HRCT 01/21/2021 1. There is minimal irregular peripheral interstitial opacity most notable at the lung bases and involving non dependent portions of the right middle lobe and lingula. Findings suggest minimal fibrosis 2 Mild, lobular air trapping on expiratory phase imaging, suggestive of small airways disease. 3. Emphysema. 6. Coronary artery disease -  PFTs 01/31/21 no change vs priors with mild concavity dlco 10.3 (42%) not corrected for hgb -  02/18/2021   Walked RA  approx   600 ft  @ moderate pace  stopped due to  End of study,  Min sob and sats 90% at end with hgb 12.7    - Echo 10/05/2021  G1 diastolic dysfunction only, nl LA/RA - 08/18/2021   Walked on RA x  3  lap(s) =  approx 466ft @ mod to fast pace, stopped due to end of study with mild sob and   with lowest 02 sats 90%   - 05/18/2023 Acute eval for sob x 4 weeks with sats 86% RA p rest x 5 min > to ER   Appears to have worsening PF though presentation is unusual and reported symptomatic response to symbicort  160 is curious > ok to continue to use

## 2023-10-29 NOTE — Assessment & Plan Note (Addendum)
Rx 02 4lpm since admit 05/18/23  - 06/29/2023   Walked on 4lpm  x  1  lap(s) =  approx 150  ft  @ slow pace, stopped due to weakness with lowest 02 sats 87%   - 07/27/2023 added humidity and referred for best fit for amb 02 > repeated request 10/29/2023   Advised again: Make sure you check your oxygen saturation  AT  your highest level of activity (not after you stop)   to be sure it stays over 90% and adjust  02 flow upward to maintain this level if needed but remember to turn it back to previous settings when you stop (to conserve your supply).   Follow up in this office is prn now that established in [place]         Each maintenance medication was reviewed in detail including emphasizing most importantly the difference between maintenance and prns and under what circumstances the prns are to be triggered using an action plan format where appropriate.  Total time for H and P, chart review, counseling, reviewing hfa/02/pulse ox  device(s) and generating customized AVS unique to this office visit / same day charting = 30 min summary final f/u

## 2023-10-29 NOTE — Assessment & Plan Note (Signed)
?   Onset p covid 09/2019 with 1st desats with exertion noted 01/19/2021  CT s contrast  05/25/22  Moderate centrilobular emphysema. Mild subpleural pulmonary fibrosis. -  HRCT 07/23/23  c/w mild progressive since  c/w Prob UIP > referred to PF clinic 07/30/2023 >>>   All f/u planned per the [place] [facility]

## 2023-10-30 ENCOUNTER — Ambulatory Visit (HOSPITAL_BASED_OUTPATIENT_CLINIC_OR_DEPARTMENT_OTHER)
Admission: RE | Admit: 2023-10-30 | Discharge: 2023-10-30 | Disposition: A | Discharge status: Home / Self Care | Payer: Medicare HMO | Source: Ambulatory Visit | Attending: Internal Medicine | Admitting: Internal Medicine

## 2023-10-30 ENCOUNTER — Ambulatory Visit (HOSPITAL_COMMUNITY)
Admission: RE | Admit: 2023-10-30 | Discharge: 2023-10-30 | Disposition: A | Discharge status: Home / Self Care | Payer: Medicare HMO | Source: Ambulatory Visit | Attending: Internal Medicine | Admitting: Internal Medicine

## 2023-10-30 DIAGNOSIS — R0609 Other forms of dyspnea: Secondary | ICD-10-CM | POA: Insufficient documentation

## 2023-10-30 LAB — NM MYOCAR MULTI W/SPECT W/WALL MOTION / EF
LV dias vol: 40 mL (ref 62–150)
LV sys vol: 12 mL
Nuc Stress EF: 70 %
Peak HR: 117 {beats}/min
RATE: 0.4
Rest HR: 102 {beats}/min
Rest Nuclear Isotope Dose: 10.6 mCi
SDS: 3
SRS: 1
SSS: 4
ST Depression (mm): 0 mm
Stress Nuclear Isotope Dose: 32 mCi
TID: 0.67

## 2023-10-30 MED ORDER — TECHNETIUM TC 99M TETROFOSMIN IV KIT
10.0000 | PACK | Freq: Once | INTRAVENOUS | Status: AC | PRN
  Administered 2023-10-30: 10.6 via INTRAVENOUS

## 2023-10-30 MED ORDER — SODIUM CHLORIDE FLUSH 0.9 % IV SOLN
INTRAVENOUS | Status: AC
  Administered 2023-10-30: 10 mL via INTRAVENOUS
  Filled 2023-10-30: qty 10

## 2023-10-30 MED ORDER — TECHNETIUM TC 99M TETROFOSMIN IV KIT
30.0000 | PACK | Freq: Once | INTRAVENOUS | Status: AC | PRN
  Administered 2023-10-30: 32 via INTRAVENOUS

## 2023-10-30 MED ORDER — REGADENOSON 0.4 MG/5ML IV SOLN
INTRAVENOUS | Status: AC
  Administered 2023-10-30: 0.4 mg via INTRAVENOUS
  Filled 2023-10-30: qty 5

## 2023-11-06 NOTE — Patient Instructions (Signed)
ICD-10-CM   1. ILD (interstitial lung disease) (HCC)  J84.9 Pulmonary function test    Sed Rate (ESR)    ANA+ENA+DNA/DS+Scl 70+SjoSSA/B    CK Total (and CKMB)    QuantiFERON-TB Gold Plus    Hypersensitivity Pneumonitis    Hepatic function panel    Aldolase    Rheumatoid Factor    EKG 12-Lead    CBC w/Diff    Basic Metabolic Panel (BMET)    2. Dyspnea on exertion  R06.09     3. Coronary artery calcification seen on CAT scan  I25.10     4. Tachycardia  R00.0      ILD   - I am concerned you  have Interstitial Lung Disease (ILD)  -  There are MANY varieties of this - To narrow down possibilities and assess severity please do the following  PLAN  - take ILD questionnaire packet with you and bring it back at next visit - do spirometry and dlco test (no BD response, no lung volumes; choose a location depending on schedule and convenience)  - ca be in [place]  - do walking test OR SIT/STAND test on room air in the office (not 6 min walk test)  - - do following blood work - autoimmune panel: Serum: ESR, ANA, DS-DNA, RF, anti-CCP, ssA, ssB, scl-70, Total CK,  Aldolase,   - do blood LFT, CBC, BMET - do serum Hypersensitivity Pneumonitis Panel   - do blood  Quantiferon Gold   - continue o2 - 3-4L [place] (hard to get accurate reading)   - refer Chesley Mires for counseling on esbriet v ofev   - next visit we will consider starting on one of these medicines   Tachycardia - Sinus 122/min - likey due to lung disease Coronary ARtery calcification On CT Scan PVC     Plan  - refer [facility] in [place] - any chest pain go to ER  Followup - next 4-6 weeks to discuss next steps

## 2023-11-06 NOTE — Progress Notes (Unsigned)
Brief patient profile:  77 yowm quit smoking 2000 with mild doe @ wt 156  and did fine until around 2013 gave up pushing mower and gradually downhill since referred to pulmonary clinic 06/14/2017 by Dr   Ouida Sills   06/14/2017 1st [facility] office visit/ [facility]  No resp rx  Chief Complaint  Patient presents with   Pulmonary Consult    Referred by Dr. Carylon Perches. Pt c/o SOB x 6 months, esp worse over the past 3 months. He states he gets out of breath walking an incline, such as to his mailbox. He states that he sometimes he wakes up in the night feeling SOB.  He also c/o non prod cough.   doe x 5 years,  Stops at top of incline on way back to house x 6 months  = MMRC1 = can walk nl pace, flat grade, can't hurry or go uphills or steps s sob   Never tried inhalers Worse in heat and humbidity  freq wakes with dry cough / chest tightness sits up and goes away  rec You do not have significant copd at this point and likely never will  Pantoprazole (protonix) 40 mg   Take  30-60 min before first meal of the day and Pepcid (famotidine)  20 mg one @  bedtime until return to office - this is the best way to tell whether stomach acid is contributing to your problem.   GERD diet     08/20/2017  f/u ov/[facility] re: doe on gerd rx/ gi eval in progress for anemia/ no resp rx  Chief Complaint  Patient presents with   Follow-up    PFT's done today. Increased SOB for the past day. Chest feels heavy today.  continues to have noct spells of sob despite nl BNP and no evidence of chf on cxr or obvious anemia Tells me his hgb still trending down and GI eval in progess rec You do not have any significant copd If you continue to have breathing issues after your anemia is corrected might consider trial off ACTOS next and /or cardiology evaluation    Admission date:  10/03/2019     Discharge Date:  10/07/2019   Discharge Diagnosis  AKI (acute kidney injury) (HCC) [N17.9] COVID-19 virus infection [U07.1]      Acute respiratory failure with hypoxia (HCC)   Acute respiratory disease due to COVID-19 virus   Type 2 diabetes mellitus without complication (HCC)   Essential hypertension      10/27/19 History of Present Illness: Dyspnea:  2 laps off 02 prior to discharge / trend is improving at home but not checking sats  Cough: minimal clear  Sleeping: flat/ one pillow SABA use: none 02: none  Dex last day 10/12/2019 rec Make sure you check your oxygen saturation at your highest level of activity to be sure it stays over 90% and keep track of it at least once a week, more often if breathing getting worse, and let me know if losing ground.>>> did not do       01/19/2021  f/u ov/[place] office/[facility] re: doe p covid  Chief Complaint  Patient presents with   Follow-up    Breathing is overall doing well and no new co's today.    Dyspnea:  Up and down the hill to the mb daily feels it getting a little harder than it was about a month ago since feet started bothering him with paresthesias and lack of balance  Cough: none/ some p  eating assoc with mild dysphagia ? Barrett's  Sleeping: flat bed / R side / 1 pillow SABA use: none  02: none Covid status: vax x 3  rec Stay as active as you can and call if losing ground with exercise tolerance  Pulmonary follow up is as directed by Dr Ouida Sills  Late add:  Based on desats with no explanation and c/o worse ex tol > HRCT/pfts next step > no specific findings/ 7 mm nodule needs f/u @ 6-12 m     04/2023 started on 02    06/29/2023  post hosp f/u ov/[place] office/[facility] re: doe/ cough p covid 09/2019 maint on symbicort 160  better since admit  Chief Complaint  Patient presents with   Shortness of Breath  Dyspnea:  has not  tried mailbox yet, just room to room and not titraitng 02  Cough: no change baseline  Sleeping: bed is flat/ high pillow under head  SABA use: combivent  02: 4lpm cont since admit  Rec Continue 4lpm at bedtime for now  Make  sure you check your oxygen saturation  AT  your highest level of activity (not after you stop)   to be sure it stays over 90%   Please schedule a follow up office visit in 4 weeks, sooner if needed  with all medications /inhalers/ solutions in hand    07/27/2023  f/u ov/[place] office/[facility] re: post covid resp failure maint on symbicort/ prn combivent  f/u hrct done 07/23/23  Chief Complaint  Patient presents with   Dyspnea on exertion  Dyspnea:  last mailbox trip was prior to June 2024 100 ft with hills - wife worried about his balance   Cough: not a problem  Sleeping: bed is flat s  resp cc  SABA use:  not since last ov  02: 4lpm cont s humidity  Rec My office will be contacting you by phone for referral to [facility] for best fit evaluation for portable 02 and humdity for 02   Try breztri (or symbicort)  Take 2 puffs first thing in am and then another 2 puffs about 12 hours later.   Work on inhaler technique:   >>>  Remember how golfers warm up by taking practice swings - do this with an empty inhaler  I emphasized that nasal steroids (Nasacort AQ) have no immediate benefit  Please schedule a follow up visit in 3 months but call sooner if needed   - HRCT 07/23/23  c/w mild progressive since  c/w Prob UIP > referred to PF clinic 07/30/2023     OV 09/03/2023 transfer of care from Dr. Sandrea Hughs to Dr. Marchelle Gearing in the ILD center.  Subjective:  Patient ID: Christian Woodard, male , DOB: Dec 28, 1945 , age 77 y.o. , MRN: [id number] , ADDRESS: [address]  [place] [id number] PCP Carylon Perches, MD Patient Care Team: Carylon Perches, MD as PCP - General (Internal Medicine) Jena Gauss Gerrit Friends, MD as Consulting Physician (Gastroenterology) Nyoka Cowden, MD as Consulting Physician (Pulmonary Disease)  This Provider for this visit: Treatment Team:  Attending Provider: Kalman Shan, MD    09/03/2023 -   Chief Complaint  Patient presents with   Pulmonary Consult     Referred by Dr. Sherene Sires for  IPF.      HPI Christian Woodard 77 y.o. -presents on referral from Dr. er.  History is taken from the patient, his wife's granddaughter who stated bedside and also reviewed the records.  He tells me that in  2020 he had COVID-19 he was hospitalized at [facility] here in [place].  This was then the COVID hospital.  He was treated with oxygen and then he got better.  And then he was almost back to baseline according to the wife.  Occasionally gets short of breath.  Then starting in April May 2024 he started noticing shortness of breath going to the mailbox and coming back.  This then resulted in hospitalization for 1 day at [facility]  Much of this history was confirmed there.  He had new onset hypoxemia and discharged on 3-4 L oxygen which she continues to use but there is a problem checking his pulse ox because the finger tracings are very poor at home.  He has continued this amount of oxygen.  The finger pulse ox would not pick up at all.  I turned his oxygen off and his pulse ox today was 88-91% on room air with a forehead probe [corresponding finger probe was 55%].  Review of the records indicate that he did have history of chronic Barrett's and's esophagus.  In the hospital iron deficiency anemia hemoglobin 12.5 g was noticed.  He was started on supplemental iron.  He underwent endoscopy later in July 2024 at [facility].  Barrett's esophagus with low-grade dysplasia was confirmed.  In the hospital he was discharged with a diagnosis of acute/subacute hypoxemic respiratory failure secondary to pulmonary fibrosis.  Also diastolic heart failure.  An echo in the hospital showed EF 65 to 70%.  It appears after that he was diuresed.  A follow-up BNP in August 2024 is now.    He did undergo follow-up high-resolution CT chest July 23, 2023: It shows definite presence of ILD in the bases and a craniocaudal gradient.  It is described as probable UIP but I wonder if this is all indeterminate.   He had a previous CT chest in June 2023 that did have some mild ILD findings and before that in February 2022 which was mostly ILA/nearly normal in my view.  He has had abnormal PFTs with a low DLCO is 2018 last PFT 2022 and between 2018 and 2022 DLCO reduced and overall stable.  His current CT does show coronary artery calcification.  Twelve-lead EKG today because he was tachycardic and irregular showed PVC but sinus rhythm otherwise.  It appears no change from baseline.  I personally visualized this.  CT Chest data from date: 07/23/23  - personally visualized and independently interpreted : YES - my findings are: as below Narrative & Impression  CLINICAL DATA:  77 year old male with history of shortness of breath for the past 6 months, worsening over the past 3 months.   EXAM: CT CHEST WITHOUT CONTRAST   TECHNIQUE: Multidetector CT imaging of the chest was performed following the standard protocol without intravenous contrast. High resolution imaging of the lungs, as well as inspiratory and expiratory imaging, was performed.   RADIATION DOSE REDUCTION: This exam was performed according to the departmental dose-optimization program which includes automated exposure control, adjustment of the mA and/or kV according to patient size and/or use of iterative reconstruction technique.   COMPARISON:  Chest CT 05/25/2022. High-resolution chest CT 01/21/2021.   FINDINGS: Cardiovascular: Heart size is normal. There is no significant pericardial fluid, thickening or pericardial calcification. There is aortic atherosclerosis, as well as atherosclerosis of the great vessels of the mediastinum and the coronary arteries, including calcified atherosclerotic plaque in the left main, left anterior descending, left circumflex and right coronary arteries.  Mediastinum/Nodes: No pathologically enlarged mediastinal or hilar lymph nodes. Please note that accurate exclusion of hilar adenopathy is  limited on noncontrast CT scans. Esophagus is unremarkable in appearance. No axillary lymphadenopathy.   Lungs/Pleura: High-resolution images demonstrate widespread areas of mild ground-glass attenuation, septal thickening, scattered subpleural reticulation, mild cylindrical bronchiectasis and some peripheral bronchiolectasis. These findings have a mild craniocaudal gradient and are mildly progressive compared to prior examinations. No frank honeycombing confidently identified. Inspiratory and expiratory imaging demonstrates some very mild air trapping, indicative of mild small airways disease. No acute consolidative airspace disease. No pleural effusions.   Upper Abdomen: Aortic atherosclerosis.  Status post cholecystectomy.   Musculoskeletal: There are no aggressive appearing lytic or blastic lesions noted in the visualized portions of the skeleton.   IMPRESSION: 1. Mild progression of interstitial lung disease with a spectrum of findings considered probable usual interstitial pneumonia (UIP) on today's examination. 2. Aortic atherosclerosis, in addition to left main and three-vessel coronary artery disease. Please note that although the presence of coronary artery calcium documents the presence of coronary artery disease, the severity of this disease and any potential stenosis cannot be assessed on this non-gated CT examination. Assessment for potential risk factor modification, dietary therapy or pharmacologic therapy may be warranted, if clinically indicated.   Aortic Atherosclerosis (ICD10-I70.0).     Electronically Signed   By: Trudie Reed M.D.   On: 07/30/2023 10:59      Result History   OV 11/07/2023  Subjective:  Patient ID: Christian Woodard, male , DOB: May 22, 1946 , age 60 y.o. , MRN: [id number] , ADDRESS: [address] [place]  [id number] PCP Carylon Perches, MD Patient Care Team: Carylon Perches, MD as PCP - General (Internal Medicine) Marjo Bicker,  MD as PCP - Cardiology (Cardiology) Jena Gauss Gerrit Friends, MD as Consulting Physician (Gastroenterology) Nyoka Cowden, MD as Consulting Physician (Pulmonary Disease)  This Provider for this visit: Treatment Team:  Attending Provider: Kalman Shan, MD    11/07/2023 -   Chief Complaint  Patient presents with   Follow-up    Pft f/u, using breztri family denies concerns. Using 4-5 L      HPI TATIANA LETTER 77 y.o. -ILD and out of proportion respiratory failure hypoxemic workup in progress  Presents for follow-up with his wife and daughter/granddaughterr, they again reiterated that oxygen requirements only since May/June 2024 particularly June 2024.  He is requiring 4 to 6 L at all times.  Last time he had problems figure out his true pulse ox because of the finger probe.  This time we used a forehead probe and sure enough room air pulse ox quickly desaturated to 85%.  Even on 3 to 4 L he was 89% - 90% at rest.  He tells me no new medical issues since last visit but his sinuses have been congested and he feels like completely blocked.  In terms of his ILD and out of proportion hypoxemic respiratory failure he had autoimmune panel and serology and this is normal.  I given the ILD questionnaire to do but he has not done that.  He did have pulmonary function test his FVC is rock steady stable since 2018 through December 2024 but his DLCO has declined but only modestly from 55% to 45% over 6 years.  I did explain to him just isolated reduction in DLCO 45% typically in my clinical experience does not correlate with requiring 4 to 5 L of oxygen but indeed he does have hypoxemia.  I again  visualized the CT scan of the chest with him and his family.  I agree there is slight progression in ILD over time but again the overall burden appears very mild CAD scale to moderate.  Review of the records indicate that his last echo in summer 2024 did not show any evidence of cor pulmonale.  Noticed that he has  never had a bubble study.  Noticed he is not had a right heart catheterization.  I have not reached out to the heart failure service to consider getting a right heart catheterization.  Also ordered bubble study.  In terms of his fibrosis discussed antifibrotic's with nintedanib and pirfenidone [see below] based on this event with pirfenidone.  Will start low-dose protocol to make sure he is not having any side effects and then slowly escalate to full dose.   Esbriet/Pirfenidone requires intensive drug monitoring due to high concerns for Adverse effects of , including  Drug Induced Liver Injury, significant GI side effects that include but not limited to Diarrhea, Nausea, Vomiting,  and other system side effects that include Fatigue, headaches, weight loss and other side effects such as skin rash. These will be monitored with  blood work such as LFT initially once a month for 6 months and then quarterly  Nintedanib/Ofev requires intensive drug monitoring due to high concerns for Adverse effects of , including  Drug Induced Liver Injury, significant GI side effects that include but not limited to Diarrhea, Nausea, Vomiting,  and other system side effects that include Fatigue,  weight loss. Cardiac side effects are a black box warning as well. These will be monitored with  blood work such as LFT initially once a month for 6 months and then quarterly   Coronary calcification: I did have him see cardiology in [place].  He saw Dr.Mallipeddi.  He had the study on 10/30/2023.  Normal study.   SYMPTOM SCALE - ILD 11/07/2023  Current weight   O2 use ra  Shortness of Breath 0 -> 5 scale with 5 being worst (score 6 If unable to do)  At rest 2  Simple tasks - showers, clothes change, eating, shaving 5  Household (dishes, doing bed, laundry) 3  Shopping 4  Walking level at own pace 3  Walking up Stairs 4  Total (30-36) Dyspnea Score 21  How bad is your cough? 1  How bad is your fatigue 1  How bad is  nausea 00  How bad is vomiting?  0  How bad is diarrhea? 00  How bad is anxiety? 0  How bad is depression 0  Any chronic pain - if so where and how bad x        Latest Reference Range & Units 09/03/23 09:44  ANA Titer 1  Negative  dsDNA Ab 0 - 9 IU/mL 1  ENA RNP Ab 0.0 - 0.9 AI <0.2  ENA SSA (RO) Ab 0.0 - 0.9 AI <0.2  ENA SSB (LA) Ab 0.0 - 0.9 AI <0.2  RA Latex Turbid. <14 IU/mL <10  ENA SM Ab Ser-aCnc 0.0 - 0.9 AI <0.2  Scleroderma (Scl-70) (ENA) Antibody, IgG 0.0 - 0.9 AI <0.2     Latest Reference Range & Units 09/03/23 09:44  Sed Rate 0 - 20 mm/hr 25 (H)  (H): Data is abnormally high      Latest Ref Rng & Units 11/07/2023    1:54 PM 01/31/2021    1:28 PM 08/20/2017    8:36 AM  PFT Results  FVC-Pre L 2.89  P 2.87  2.92   FVC-Predicted Pre % 90  P 81  80   FVC-Post L  2.97  2.93   FVC-Predicted Post %  84  80   Pre FEV1/FVC % % 76  P 73  75   Post FEV1/FCV % %  75  76   FEV1-Pre L 2.19  P 2.10  2.18   FEV1-Predicted Pre % 97  P 83  82   FEV1-Post L  2.23  2.23   DLCO uncorrected ml/min/mmHg 8.79  P 10.30  11.95   DLCO UNC% % 42  P 47  45   DLCO corrected ml/min/mmHg 9.26  P 63.39  14.67   DLCO COR %Predicted % 45  P 290  55   DLVA Predicted % 54  P 337  78   TLC L  5.16  4.82   TLC % Predicted %  84  78   RV % Predicted %  89  86     P Preliminary result      ECHO  05/19/23 Sonographer Comments: Suboptimal apical window and patient is obese. Image  acquisition challenging due to patient body habitus.  IMPRESSIONS     1. Left ventricular ejection fraction, by estimation, is 65 to 70%. The  left ventricle has normal function. The left ventricle has no regional  wall motion abnormalities. Indeterminate diastolic filling due to E-A  fusion.   2. Right ventricular systolic function is normal. The right ventricular  size is mildly enlarged.   3. The mitral valve is grossly normal. No evidence of mitral valve  regurgitation. No evidence of mitral stenosis.    4. The aortic valve is normal in structure. Aortic valve regurgitation is  not visualized. No aortic stenosis is present.   5. The inferior vena cava is dilated in size with >50% respiratory  variability, suggesting right atrial pressure of 8 mmHg.      has a past medical history of Arthritis, Barrett's esophagus, Diabetes mellitus, GERD (gastroesophageal reflux disease), and Hypercholesteremia.   reports that he quit smoking about 24 years ago. His smoking use included cigarettes. He started smoking about 60 years ago. He has a 72 pack-year smoking history. He has never used smokeless tobacco.  Past Surgical History:  Procedure Laterality Date   BACK SURGERY  2000   CHOLECYSTECTOMY     COLONOSCOPY  09/15/2011   Dr. Jena Gauss: Suboptimal prep. Pancolonic diverticulosis. Tubular adenomas. Next colonoscopy 5 years.   COLONOSCOPY N/A 09/22/2016   Surgeon: Corbin Ade, MD; pancolonic diverticulosis, otherwise normal exam.  Recommended repeat in 5 years.   COLONOSCOPY WITH PROPOFOL N/A 09/29/2021   Procedure: COLONOSCOPY WITH PROPOFOL;  Surgeon: Corbin Ade, MD;  [facility];  Service: Endoscopy;  Laterality: N/A;  9:00am   ESOPHAGOGASTRODUODENOSCOPY N/A 08/31/2017   Surgeon: Corbin Ade, MD;  Barrett's esophagus with low-grade dysplasia, small hiatal hernia with abnormal gastric mucosa biopsied (no H. pylori), normal examined duodenum.     ESOPHAGOGASTRODUODENOSCOPY  11/2018   [facility]; Barrett's esophagus, negative for dysplasia   ESOPHAGOGASTRODUODENOSCOPY  01/2020   [facility]; Barrett's esophagus, indefinite for focal low-grade dysplasia.   GIVENS CAPSULE STUDY N/A 10/26/2017   Surgeon: Corbin Ade, MD; localized clustered areas of white "blunted appearing" abnormal small bowel mucosa/villi of uncertain significance.  Follow-up CTE and small bowel enteroscopy at [facility] without significant findings.    GIVENS CAPSULE STUDY N/A 05/15/2023   Procedure: GIVENS CAPSULE STUDY;   Surgeon: Corbin Ade, MD;  Location: [facility];  Service: Endoscopy;  Laterality: N/A;  730am   HERNIA REPAIR     as a child   left cornea transplant Left    POLYPECTOMY  09/29/2021   Procedure: POLYPECTOMY;  Surgeon: Corbin Ade, MD;  Location: [facility];  Service: Endoscopy;;   Right cornea transplant     SMALL BOWEL ENTEROSCOPY  11/2017   [facility]; Barrett's esophagus focally positive for low-grade dysplasia, normal stomach, normal duodenum, normal jejunum.  Small bowel biopsies were benign.   TONSILLECTOMY      No Known Allergies  Immunization History  Administered Date(s) Administered   Fluad Quad(high Dose 65+) 10/07/2019, 11/22/2021   Influenza-Unspecified 10/29/2017, 08/20/2023   Moderna SARS-COV2 Booster Vaccination 01/10/2021   Moderna Sars-Covid-2 Vaccination 02/26/2020, 03/30/2020   Tdap 01/23/2017    Family History  Problem Relation Age of Onset   Heart disease Mother    Heart disease Father    Emphysema Father        smoked   Lung cancer Father        smoked   Colon cancer Neg Hx      Current Outpatient Medications:    acetaminophen (TYLENOL) 500 MG tablet, Take 1,000 mg by mouth every 6 (six) hours as needed (headaches/pain.)., Disp: , Rfl:    aspirin EC 81 MG tablet, Take 81 mg by mouth every evening. Swallow whole., Disp: , Rfl:    atorvastatin (LIPITOR) 10 MG tablet, Take 10 mg by mouth at bedtime., Disp: , Rfl:    Budeson-Glycopyrrol-Formoterol (BREZTRI AEROSPHERE) 160-9-4.8 MCG/ACT AERO, Inhale 2 puffs into the lungs in the morning and at bedtime., Disp: , Rfl:    budesonide-formoterol (SYMBICORT) 160-4.5 MCG/ACT inhaler, Inhale 2 puffs into the lungs in the morning and at bedtime., Disp: 1 each, Rfl: 3   chlorpheniramine (CHLOR-TRIMETON) 4 MG tablet, Take 4 mg by mouth in the morning and at bedtime., Disp: , Rfl:    cyanocobalamin (VITAMIN B12) 1000 MCG tablet, Take 1,000 mcg by mouth daily., Disp: , Rfl:    famotidine (PEPCID) 20 MG tablet,  Take 20 mg by mouth daily after supper. Takes after supper, Disp: , Rfl:    Ferrous Gluconate (IRON 27 PO), Take 1 tablet by mouth daily., Disp: , Rfl:    furosemide (LASIX) 20 MG tablet, Take 1 tablet (20 mg total) by mouth daily. (Patient taking differently: Take 40 mg by mouth daily.), Disp: 30 tablet, Rfl: 2   gabapentin (NEURONTIN) 300 MG capsule, Take 1 capsule (300 mg total) by mouth 2 (two) times daily., Disp: , Rfl:    glipiZIDE (GLUCOTROL XL) 5 MG 24 hr tablet, Take 5 mg by mouth daily with breakfast., Disp: , Rfl:    Ipratropium-Albuterol (COMBIVENT RESPIMAT) 20-100 MCG/ACT AERS respimat, Inhale 1 puff into the lungs every 6 (six) hours as needed for wheezing or shortness of breath., Disp: 4 g, Rfl: 2   LANTUS SOLOSTAR 100 UNIT/ML Solostar Pen, Inject 45 Units into the skin in the morning., Disp: , Rfl:    losartan (COZAAR) 50 MG tablet, Take 50 mg by mouth in the morning., Disp: , Rfl:    metFORMIN (GLUCOPHAGE) 1000 MG tablet, Take 1,000 mg by mouth 2 (two) times daily., Disp: , Rfl:    NOVOLOG FLEXPEN 100 UNIT/ML FlexPen, Inject 6 Units into the skin 2 (two) times daily., Disp: , Rfl:    pantoprazole (PROTONIX) 40 MG tablet, TAKE 1 TABLET BY MOUTH ONE TIME DAILY 30-60 MINUTES BEFORE BREAKFAST, Disp: 90 tablet,  Rfl: 3   potassium chloride SA (KLOR-CON M) 20 MEQ tablet, Take 1 tablet (20 mEq total) by mouth daily. (Patient taking differently: Take 10 mEq by mouth daily.), Disp: 30 tablet, Rfl: 1      Objective:   Vitals:   11/07/23 1458  BP: 114/66  Pulse: 88  SpO2: 91%  Weight: 169 lb 6.4 oz (76.8 kg)  Height: 5\' 4"  (1.626 m)    Estimated body mass index is 29.08 kg/m as calculated from the following:   Height as of this encounter: 5\' 4"  (1.626 m).   Weight as of this encounter: 169 lb 6.4 oz (76.8 kg).  @WEIGHTCHANGE @  [employer]   11/07/23 1458  Weight: 169 lb 6.4 oz (76.8 kg)     Physical Exam   General: No distress. Looks stable on o2 forehead propbel - 5L  [place] 90% O2 at rest: YES Cane present: NO Sitting in wheel chair: No Frail: yes somewhat Obese: no Neuro: Alert and Oriented x 3. GCS 15. Speech normal Psych: Pleasant Resp:  Barrel Chest - yes.  Wheeze - no, Crackles - mildn, No overt respiratory distress CVS: Normal heart sounds. Murmurs - no Ext: Stigmata of Connective Tissue Disease - no HEENT: Normal upper airway. PEERL +. No post nasal drip        Assessment:       ICD-10-CM   1. Chronic respiratory failure with hypoxia (HCC)  J96.11 ECHOCARDIOGRAM COMPLETE    Ambulatory Referral for DME    2. Chronic congestion of paranasal sinus  J32.9 CT MAXILLOFACIAL W CONTRAST    3. IPF (idiopathic pulmonary fibrosis) (HCC)  J84.112     4. Encounter for therapeutic drug monitoring  Z51.81     5. High risk medication use  Z79.899     6. Coronary artery calcification seen on CAT scan  I25.10 AMB referral to CHF clinic         Plan:     Patient Instructions     ICD-10-CM   1. ILD (interstitial lung disease) (HCC)  J84.9 Pulmonary function test    Sed Rate (ESR)    ANA+ENA+DNA/DS+Scl 70+SjoSSA/B    CK Total (and CKMB)    QuantiFERON-TB Gold Plus    Hypersensitivity Pneumonitis    Hepatic function panel    Aldolase    Rheumatoid Factor    EKG 12-Lead    CBC w/Diff    Basic Metabolic Panel (BMET)    2. Dyspnea on exertion  R06.09     3. Coronary artery calcification seen on CAT scan  I25.10     4. Tachycardia  R00.0      Chronic hypoxemic respiratory failure  -This definitely seems out of proportion to her lung function and the amount of fibrosis -Chronic sinus congestion or right-to-left shunt or pulmonary hypertension can explain this  Plan - Continue oxygen to keep pulse ox goal greater than 88%  -- Use 3-5 L of oxygen  -CMA to send a request to the DME time any to give you a humidifier for the oxygen. - I have sent a message to the heart failure team to do right heart catheterization - Do bubble study  echocardiogram -To CT scan sinus   ILD/IPF High risk prescription Encounter for therapeutic monitoring  -You do have pulmonary fibrosis and this is slightly progressive over time - I will call this IPF  PLAN -Start pirfenidone -low-dose protocol and if you are tolerating the low dose for a few months then we will  go to full dose protocol  -I have sent a message to our pharmacy team  Chronic sinus congestion  Plan - Get CT scan of the sinus without contrast  Tachycardia - Sinus 122/min - likey due to lung disease Coronary ARtery calcification On CT Scan PVC   -Noted cardiac stress test was normal   Plan -I have referred you to the heart failure team to consider right heart catheterization -You might also need a left heart catheterization  Followup -6 weeks with Dr. Marchelle Gearing or nurse practitioner to report progress   FOLLOWUP Return in about 6 weeks (around 12/19/2023) for 30 min visit, ILD, Face to Face Visit, with Dr Marchelle Gearing.    SIGNATURE    Dr. Kalman Shan, M.D., F.C.C.P,  Pulmonary and Critical Care Medicine Staff Physician, [facility] Center Director - Interstitial Lung Disease  Program  Pulmonary Fibrosis [facility] Network at [facility] [place], [place], [address]  Pager: [phone], If no answer or between  15:00h - 7:00h: call [phone] Telephone: [phone]  4:53 PM 11/07/2023   HIGh Complexity  OFFICE   2021 E/M guidelines, first released in 2021, with minor revisions added in 2023. Must meet the requirements for 2 out of 3 dimensions to qualify.    Number and complexity of problems addressed Amount and/or complexity of data reviewed Risk of complications and/or morbidity  Severe exacerbation of chronic illness  Acute or chronic illnesses that may pose a threat to life or bodily function, e.g., multiple trauma, acute MI, pulmonary embolus, severe respiratory distress, progressive rheumatoid arthritis,  psychiatric illness with potential threat to self or others, peritonitis, acute renal failure, abrupt change in neurological status Must meet the requirements for 2 of 3 of the categories)  Category 1: Tests and documents, historian  Any combination of 3 of the following:  Assessment requiring an independent historian  Review of prior external note(s) from each unique source  Review of results of each unique test  Ordering of each unique test    Category 2: Interpretation of tests    Independent interpretation of a test performed by another physician/other qualified health care professional (not separately reported) - CT Category 3: Discuss management/tests  Discussion of management or test interpretation with external physician/other qualified health care professional/appropriate source (not separately reported)  - [harmacy and cards  HIGH risk of morbidity from additional diagnostic testing or treatment Examples only:  Drug therapy requiring intensive monitoring for toxicity  Decision for elective major surgery with identified pateint or procedure risk factors  Decision regarding hospitalization or escalation of level of care  Decision for DNR or to de-escalate care   Parenteral controlled  substances

## 2023-11-07 ENCOUNTER — Ambulatory Visit (HOSPITAL_BASED_OUTPATIENT_CLINIC_OR_DEPARTMENT_OTHER): Payer: Medicare HMO | Admitting: Internal Medicine

## 2023-11-07 ENCOUNTER — Encounter: Payer: Self-pay | Admitting: Internal Medicine

## 2023-11-07 ENCOUNTER — Telehealth: Payer: Self-pay | Admitting: Internal Medicine

## 2023-11-07 ENCOUNTER — Ambulatory Visit: Payer: Medicare HMO | Admitting: Internal Medicine

## 2023-11-07 VITALS — BP 114/66 | HR 88 | Ht 64.0 in | Wt 169.4 lb

## 2023-11-07 DIAGNOSIS — J84112 Idiopathic pulmonary fibrosis: Secondary | ICD-10-CM

## 2023-11-07 DIAGNOSIS — I2510 Atherosclerotic heart disease of native coronary artery without angina pectoris: Secondary | ICD-10-CM

## 2023-11-07 DIAGNOSIS — J9611 Chronic respiratory failure with hypoxia: Secondary | ICD-10-CM

## 2023-11-07 DIAGNOSIS — J329 Chronic sinusitis, unspecified: Secondary | ICD-10-CM | POA: Diagnosis not present

## 2023-11-07 DIAGNOSIS — Z5181 Encounter for therapeutic drug level monitoring: Secondary | ICD-10-CM | POA: Diagnosis not present

## 2023-11-07 DIAGNOSIS — J849 Interstitial pulmonary disease, unspecified: Secondary | ICD-10-CM | POA: Diagnosis not present

## 2023-11-07 DIAGNOSIS — Z79899 Other long term (current) drug therapy: Secondary | ICD-10-CM | POA: Diagnosis not present

## 2023-11-07 LAB — PULMONARY FUNCTION TEST
DL/VA % pred: 54 %
DL/VA: 2.2 ml/min/mmHg/L
DLCO cor % pred: 45 %
DLCO cor: 9.26 ml/min/mmHg
DLCO unc % pred: 42 %
DLCO unc: 8.79 ml/min/mmHg
FEF 25-75 Pre: 1.62 L/s
FEF2575-%Pred-Pre: 103 %
FEV1-%Pred-Pre: 97 %
FEV1-Pre: 2.19 L
FEV1FVC-%Pred-Pre: 105 %
FEV6-%Pred-Pre: 97 %
FEV6-Pre: 2.86 L
FEV6FVC-%Pred-Pre: 107 %
FVC-%Pred-Pre: 90 %
FVC-Pre: 2.89 L
Pre FEV1/FVC ratio: 76 %
Pre FEV6/FVC Ratio: 99 %

## 2023-11-07 NOTE — Patient Instructions (Signed)
Spirometry and DLCO Performed Today.  

## 2023-11-07 NOTE — Telephone Encounter (Signed)
   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  He is requiring 4 to 5 L and with the forehead probe pulse ox he is saturating 89-90%.  His DLCO is 42% so there are some out of proportion stuff going on.  Plan - I am ordering bubble study - Please d get right heart catheterization

## 2023-11-07 NOTE — Progress Notes (Signed)
Spirometry and DLCO Performed Today.  

## 2023-11-07 NOTE — Telephone Encounter (Signed)
Christian Woodard   Low dose esbriet start     SIGNATURE    Dr. Kalman Shan, M.D., F.C.C.P,  Pulmonary and Critical Care Medicine Staff Physician, [facility] Center Director - Interstitial Lung Disease  Program  Pulmonary Fibrosis [facility] Network at [facility] [place], [place], [address]   Pager: [phone], If no answer  -> Check AMION or Try [phone] Telephone (clinical office): [phone] Telephone (research): [phone]  3:46 PM 11/07/2023

## 2023-11-08 ENCOUNTER — Other Ambulatory Visit (HOSPITAL_COMMUNITY): Payer: Self-pay

## 2023-11-08 ENCOUNTER — Telehealth (HOSPITAL_BASED_OUTPATIENT_CLINIC_OR_DEPARTMENT_OTHER): Payer: Self-pay | Admitting: Internal Medicine

## 2023-11-08 ENCOUNTER — Encounter (HOSPITAL_COMMUNITY): Payer: Self-pay

## 2023-11-08 DIAGNOSIS — I272 Pulmonary hypertension, unspecified: Secondary | ICD-10-CM

## 2023-11-08 NOTE — Telephone Encounter (Signed)
Attempted to call patient about right heart catheterization scheduled for January 20 ,2025. Left voice message of patient's wife phone to call office back to review procedure instructions.

## 2023-11-08 NOTE — Telephone Encounter (Signed)
Called patient back and spoke to patient to review right heart catheterization time and instructions.Reviewed with patient on day of procedure not to take Glipizide,metformin or any lantus or novolog insulin,lasix. Aware of nothing to eat or drink after midnight. Has transportation to and from procedure Letter also mailed to patient's home with instructions

## 2023-11-08 NOTE — Telephone Encounter (Signed)
Submitted a Prior Authorization request to [facility] for PIRFENIDONE via CoverMyMeds. Will update once we receive a response.  Key: [id number]

## 2023-11-12 DIAGNOSIS — Z947 Corneal transplant status: Secondary | ICD-10-CM | POA: Diagnosis not present

## 2023-11-12 DIAGNOSIS — H16223 Keratoconjunctivitis sicca, not specified as Sjogren's, bilateral: Secondary | ICD-10-CM | POA: Diagnosis not present

## 2023-11-12 DIAGNOSIS — E119 Type 2 diabetes mellitus without complications: Secondary | ICD-10-CM | POA: Diagnosis not present

## 2023-11-12 DIAGNOSIS — H40023 Open angle with borderline findings, high risk, bilateral: Secondary | ICD-10-CM | POA: Diagnosis not present

## 2023-11-12 DIAGNOSIS — H18623 Keratoconus, unstable, bilateral: Secondary | ICD-10-CM | POA: Diagnosis not present

## 2023-11-20 ENCOUNTER — Other Ambulatory Visit (HOSPITAL_COMMUNITY): Payer: Self-pay

## 2023-11-20 NOTE — Telephone Encounter (Signed)
Received notification from [facility] regarding a prior authorization for PIRFENIDONE. Authorization has been APPROVED from 11/08/23 to 11/26/24. Approval letter sent to scan center.  Per test claim, copay for 30 days supply is $147.41  Patient can fill through [facility]: [phone]   Can run test claim with new year. Max OOP for 2025 for rx drugs is $2000. Unfortunately there are no open pulmonary fibrosis grants to help cover copays. Patient will not qualify for [employer] PAP due to generic pirfenidone being approved through insurance  Chesley Mires, PharmD, MPH, BCPS, CPP Clinical Pharmacist (Rheumatology and Pulmonology)

## 2023-11-26 ENCOUNTER — Telehealth: Payer: Self-pay

## 2023-11-27 DIAGNOSIS — E1129 Type 2 diabetes mellitus with other diabetic kidney complication: Secondary | ICD-10-CM | POA: Diagnosis not present

## 2023-11-27 DIAGNOSIS — I700 Atherosclerosis of aorta: Secondary | ICD-10-CM | POA: Diagnosis not present

## 2023-11-27 DIAGNOSIS — Z79899 Other long term (current) drug therapy: Secondary | ICD-10-CM | POA: Diagnosis not present

## 2023-11-27 DIAGNOSIS — E616 Vanadium deficiency: Secondary | ICD-10-CM | POA: Diagnosis not present

## 2023-11-27 DIAGNOSIS — I5032 Chronic diastolic (congestive) heart failure: Secondary | ICD-10-CM | POA: Diagnosis not present

## 2023-11-30 ENCOUNTER — Ambulatory Visit (HOSPITAL_COMMUNITY)
Admission: RE | Admit: 2023-11-30 | Discharge: 2023-11-30 | Disposition: A | Discharge status: Home / Self Care | Payer: Medicare HMO | Source: Ambulatory Visit | Attending: Internal Medicine | Admitting: Internal Medicine

## 2023-11-30 DIAGNOSIS — J449 Chronic obstructive pulmonary disease, unspecified: Secondary | ICD-10-CM | POA: Diagnosis not present

## 2023-11-30 DIAGNOSIS — J8410 Pulmonary fibrosis, unspecified: Secondary | ICD-10-CM | POA: Diagnosis not present

## 2023-11-30 DIAGNOSIS — J9611 Chronic respiratory failure with hypoxia: Secondary | ICD-10-CM | POA: Diagnosis not present

## 2023-11-30 DIAGNOSIS — R0609 Other forms of dyspnea: Secondary | ICD-10-CM | POA: Diagnosis not present

## 2023-11-30 DIAGNOSIS — E119 Type 2 diabetes mellitus without complications: Secondary | ICD-10-CM | POA: Diagnosis not present

## 2023-11-30 DIAGNOSIS — I10 Essential (primary) hypertension: Secondary | ICD-10-CM | POA: Diagnosis not present

## 2023-11-30 LAB — ECHOCARDIOGRAM COMPLETE
AR max vel: 2.3 cm2
AV Area VTI: 3.12 cm2
AV Area mean vel: 2.52 cm2
AV Mean grad: 2 mm[Hg]
AV Peak grad: 3.7 mm[Hg]
Ao pk vel: 0.97 m/s
Area-P 1/2: 6.22 cm2
Calc EF: 57.2 %
MV VTI: 3.14 cm2
S' Lateral: 2.2 cm
Single Plane A2C EF: 60.2 %
Single Plane A4C EF: 52.6 %

## 2023-11-30 NOTE — Progress Notes (Signed)
  Echocardiogram 2D Echocardiogram has been performed.  Ocie Doyne RDCS 11/30/2023, 10:44 AM

## 2023-12-04 DIAGNOSIS — J9611 Chronic respiratory failure with hypoxia: Secondary | ICD-10-CM | POA: Diagnosis not present

## 2023-12-04 DIAGNOSIS — I5030 Unspecified diastolic (congestive) heart failure: Secondary | ICD-10-CM | POA: Diagnosis not present

## 2023-12-04 DIAGNOSIS — E1122 Type 2 diabetes mellitus with diabetic chronic kidney disease: Secondary | ICD-10-CM | POA: Diagnosis not present

## 2023-12-04 DIAGNOSIS — I700 Atherosclerosis of aorta: Secondary | ICD-10-CM | POA: Diagnosis not present

## 2023-12-04 DIAGNOSIS — J84178 Other interstitial pulmonary diseases with fibrosis in diseases classified elsewhere: Secondary | ICD-10-CM | POA: Diagnosis not present

## 2023-12-10 ENCOUNTER — Ambulatory Visit (HOSPITAL_BASED_OUTPATIENT_CLINIC_OR_DEPARTMENT_OTHER)
Admission: RE | Admit: 2023-12-10 | Discharge: 2023-12-10 | Disposition: A | Discharge status: Home / Self Care | Payer: Medicare HMO | Source: Ambulatory Visit | Attending: Internal Medicine | Admitting: Internal Medicine

## 2023-12-10 DIAGNOSIS — R0981 Nasal congestion: Secondary | ICD-10-CM | POA: Diagnosis not present

## 2023-12-10 DIAGNOSIS — J329 Chronic sinusitis, unspecified: Secondary | ICD-10-CM | POA: Diagnosis not present

## 2023-12-10 MED ORDER — IOHEXOL 300 MG/ML  SOLN
100.0000 mL | Freq: Once | INTRAMUSCULAR | Status: AC | PRN
  Administered 2023-12-10: 80 mL via INTRAVENOUS

## 2023-12-11 MED ORDER — PIRFENIDONE 267 MG PO TABS: ORAL_TABLET | ORAL | 0 refills | Status: DC

## 2023-12-11 NOTE — Telephone Encounter (Signed)
 Patient enrolled into PF grant through PAF Award Period: 06/14/2023 - 12/10/2024 Cardholder: [id number] BIN: [id number] PCN: [id number] Group: [id number] For pharmacy inquiries, contact PDMI at [phone].  Rx sent to [facility]

## 2023-12-11 NOTE — Telephone Encounter (Signed)
 Called patient's phone and spoke to wife about enrollment in PF [place]. She was informed that their copays for Esbriet  will be covered in 2025 through the grant program, but there is no guarantee for re-enrollment in 2026. She knows to expect a phone call from [facility] to initiate the first fill of the medication.   Patient counseled on purpose, proper use, and potential adverse effects including nausea, vomiting, abdominal pain, GERD, weight loss, arthralgia, dizziness, and suns sensitivity/rash.  Stressed the importance of routine lab monitoring. Will monitor LFT's every month for the first 6 months of treatment then every 3 months. Will monitor CBC every 3 months.  Starting dose will be Esbriet  267 mg 1 tablet three times daily for 7 days, then 2 tablets three times daily. Maintenance dose will be 534 mg 1 tablet three times daily if tolerated.  Stressed the importance of taking with meals to minimize stomach upset.     Tolu Wayland Baik, PharmD [facility] PGY-1

## 2023-12-15 ENCOUNTER — Other Ambulatory Visit: Payer: Self-pay | Admitting: Pharmacist

## 2023-12-15 MED ORDER — PIRFENIDONE 267 MG PO TABS
ORAL_TABLET | ORAL | 0 refills | Status: DC
  Filled 2023-12-19: qty 180, 30d supply, fill #0

## 2023-12-15 NOTE — Addendum Note (Signed)
Addended by: Murrell Redden on: 12/15/2023 07:44 PM   Modules accepted: Orders

## 2023-12-15 NOTE — Progress Notes (Signed)
Patient counseled about pirfenidone over the phone by Tolu, RPh on 12/11/2023. See phone note  Dose (267mg ): Take 1 tab three times daily for 7 days, then 2 tabs three times daily thereafter  Chesley Mires, PharmD, MPH, BCPS, CPP Clinical Pharmacist (Rheumatology and Pulmonology)

## 2023-12-17 ENCOUNTER — Telehealth: Payer: Self-pay | Admitting: Internal Medicine

## 2023-12-17 ENCOUNTER — Other Ambulatory Visit: Payer: Self-pay

## 2023-12-17 ENCOUNTER — Encounter (HOSPITAL_COMMUNITY)
Admission: RE | Disposition: A | Discharge status: Home / Self Care | Payer: Self-pay | Source: Home / Self Care | Attending: Cardiology

## 2023-12-17 ENCOUNTER — Ambulatory Visit (HOSPITAL_COMMUNITY)
Admission: RE | Admit: 2023-12-17 | Discharge: 2023-12-17 | Disposition: A | Discharge status: Home / Self Care | Payer: Medicare HMO | Attending: Cardiology | Admitting: Cardiology

## 2023-12-17 DIAGNOSIS — I2721 Secondary pulmonary arterial hypertension: Secondary | ICD-10-CM | POA: Diagnosis not present

## 2023-12-17 DIAGNOSIS — Z794 Long term (current) use of insulin: Secondary | ICD-10-CM | POA: Insufficient documentation

## 2023-12-17 DIAGNOSIS — Z7984 Long term (current) use of oral hypoglycemic drugs: Secondary | ICD-10-CM | POA: Insufficient documentation

## 2023-12-17 DIAGNOSIS — I272 Pulmonary hypertension, unspecified: Secondary | ICD-10-CM | POA: Diagnosis not present

## 2023-12-17 DIAGNOSIS — Z8249 Family history of ischemic heart disease and other diseases of the circulatory system: Secondary | ICD-10-CM | POA: Diagnosis not present

## 2023-12-17 DIAGNOSIS — R0602 Shortness of breath: Secondary | ICD-10-CM

## 2023-12-17 DIAGNOSIS — Z87891 Personal history of nicotine dependence: Secondary | ICD-10-CM | POA: Diagnosis not present

## 2023-12-17 HISTORY — PX: RIGHT HEART CATH: CATH118263

## 2023-12-17 LAB — POCT I-STAT EG7
Acid-Base Excess: 1 mmol/L (ref 0.0–2.0)
Acid-Base Excess: 1 mmol/L (ref 0.0–2.0)
Bicarbonate: 25.6 mmol/L (ref 20.0–28.0)
Bicarbonate: 25.8 mmol/L (ref 20.0–28.0)
Calcium, Ion: 1.17 mmol/L (ref 1.15–1.40)
Calcium, Ion: 1.2 mmol/L (ref 1.15–1.40)
HCT: 38 % — ABNORMAL LOW (ref 39.0–52.0)
HCT: 39 % (ref 39.0–52.0)
Hemoglobin: 12.9 g/dL — ABNORMAL LOW (ref 13.0–17.0)
Hemoglobin: 13.3 g/dL (ref 13.0–17.0)
O2 Saturation: 58 %
O2 Saturation: 62 %
Potassium: 4.1 mmol/L (ref 3.5–5.1)
Potassium: 4.2 mmol/L (ref 3.5–5.1)
Sodium: 140 mmol/L (ref 135–145)
Sodium: 140 mmol/L (ref 135–145)
TCO2: 27 mmol/L (ref 22–32)
TCO2: 27 mmol/L (ref 22–32)
pCO2, Ven: 41.5 mm[Hg] — ABNORMAL LOW (ref 44–60)
pCO2, Ven: 42.1 mm[Hg] — ABNORMAL LOW (ref 44–60)
pH, Ven: 7.396 (ref 7.25–7.43)
pH, Ven: 7.397 (ref 7.25–7.43)
pO2, Ven: 31 mm[Hg] — CL (ref 32–45)
pO2, Ven: 32 mm[Hg] (ref 32–45)

## 2023-12-17 LAB — BASIC METABOLIC PANEL
Anion gap: 16 — ABNORMAL HIGH (ref 5–15)
BUN: 17 mg/dL (ref 8–23)
CO2: 20 mmol/L — ABNORMAL LOW (ref 22–32)
Calcium: 9.8 mg/dL (ref 8.9–10.3)
Chloride: 101 mmol/L (ref 98–111)
Creatinine, Ser: 1.41 mg/dL — ABNORMAL HIGH (ref 0.61–1.24)
GFR, Estimated: 51 mL/min — ABNORMAL LOW (ref >60)
Glucose, Bld: 190 mg/dL — ABNORMAL HIGH (ref 70–99)
Potassium: 4.5 mmol/L (ref 3.5–5.1)
Sodium: 137 mmol/L (ref 135–145)

## 2023-12-17 LAB — CBC
HCT: 46.1 % (ref 39.0–52.0)
Hemoglobin: 15.1 g/dL (ref 13.0–17.0)
MCH: 29.5 pg (ref 26.0–34.0)
MCHC: 32.8 g/dL (ref 30.0–36.0)
MCV: 90 fL (ref 80.0–100.0)
Platelets: 430 10*3/uL — ABNORMAL HIGH (ref 150–400)
RBC: 5.12 MIL/uL (ref 4.22–5.81)
RDW: 15.2 % (ref 11.5–15.5)
WBC: 14.2 10*3/uL — ABNORMAL HIGH (ref 4.0–10.5)
nRBC: 0 % (ref 0.0–0.2)

## 2023-12-17 LAB — GLUCOSE, CAPILLARY: Glucose-Capillary: 177 mg/dL — ABNORMAL HIGH (ref 70–99)

## 2023-12-17 SURGERY — RIGHT HEART CATH
Anesthesia: LOCAL

## 2023-12-17 MED ORDER — LIDOCAINE HCL (PF) 1 % IJ SOLN
INTRAMUSCULAR | Status: DC | PRN
  Administered 2023-12-17 (×2): 2 mL

## 2023-12-17 MED ORDER — METOPROLOL TARTRATE 5 MG/5ML IV SOLN
INTRAVENOUS | Status: DC | PRN
  Administered 2023-12-17: 2.5 mg via INTRAVENOUS

## 2023-12-17 MED ORDER — METOPROLOL TARTRATE 5 MG/5ML IV SOLN
INTRAVENOUS | Status: AC
Start: 2023-12-17 — End: ?
  Filled 2023-12-17: qty 5

## 2023-12-17 MED ORDER — LIDOCAINE HCL (PF) 1 % IJ SOLN
INTRAMUSCULAR | Status: AC
Start: 2023-12-17 — End: ?
  Filled 2023-12-17: qty 30

## 2023-12-17 MED ORDER — SODIUM CHLORIDE 0.9 % IV SOLN: INTRAVENOUS | Status: DC

## 2023-12-17 MED ORDER — HEPARIN (PORCINE) IN NACL 1000-0.9 UT/500ML-% IV SOLN
INTRAVENOUS | Status: DC | PRN
  Administered 2023-12-17: 500 mL

## 2023-12-17 SURGICAL SUPPLY — 7 items
CATH BALLN WEDGE 5F 110CM (CATHETERS) IMPLANT
GUIDEWIRE .025 260CM (WIRE) IMPLANT
PACK CARDIAC CATHETERIZATION (CUSTOM PROCEDURE TRAY) ×1 IMPLANT
SHEATH GLIDE SLENDER 4/5FR (SHEATH) IMPLANT
SHEATH PROBE COVER 6X72 (BAG) IMPLANT
TRANSDUCER W/STOPCOCK (MISCELLANEOUS) IMPLANT
TUBING ART PRESS 72 MALE/FEM (TUBING) IMPLANT

## 2023-12-17 NOTE — Telephone Encounter (Signed)
[facility] ad Rx team   - Patient has WHO group 3 pulmonary hypertension on right heart catheterization today.  Dr. Shirlee Latch contacted me.  He has informed the patient about inhaled treprostinil.  Plan - Please go ahead and start inhaled treprostinil [my next office visit is 01/15/2024 but you do not need to wait for that] -Verlon Au - please get me Tyvaso form to get started   xxxxx [facility] Procedural Findings: Hemodynamics (mmHg) RA mean 2 RV 66/2 PA 64/31, mean 41 PCWP mean 6  Oxygen saturations: PA 62% AO 92%  Cardiac Output (Fick) 3.98  Cardiac Index (Fick) 2.16 PVR 8.79 WU

## 2023-12-17 NOTE — H&P (Signed)
Advanced Heart Failure Team History and Physical Note   PCP:  Carylon Perches, MD  PCP-Cardiology: Marjo Bicker, MD     Reason for Admission: RHC   HPI:    Patient presents for RHC today for evaluation for PH-ILD.    Home Medications Prior to Admission medications   Medication Sig Start Date End Date Taking? Authorizing Provider  acetaminophen (TYLENOL) 500 MG tablet Take 500 mg by mouth every 6 (six) hours as needed (headaches/pain.).   Yes [provider]  aspirin EC 81 MG tablet Take 81 mg by mouth every evening. Swallow whole.   Yes [provider]  atorvastatin (LIPITOR) 10 MG tablet Take 10 mg by mouth at bedtime.   Yes [provider]  budesonide-formoterol (SYMBICORT) 160-4.5 MCG/ACT inhaler Inhale 2 puffs into the lungs in the morning and at bedtime. 05/19/23  Yes Vassie Loll, MD  chlorpheniramine (CHLOR-TRIMETON) 4 MG tablet Take 4 mg by mouth daily.   Yes [provider]  cyanocobalamin (VITAMIN B12) 1000 MCG tablet Take 1,000 mcg by mouth daily.   Yes [provider]  famotidine (PEPCID) 10 MG tablet Take 10 mg by mouth every evening.   Yes [provider]  Ferrous Gluconate (IRON 27 PO) Take 1 tablet by mouth daily.   Yes [provider]  furosemide (LASIX) 20 MG tablet Take 1 tablet (20 mg total) by mouth daily. 05/19/23 05/18/24 Yes Vassie Loll, MD  gabapentin (NEURONTIN) 300 MG capsule Take 1 capsule (300 mg total) by mouth 2 (two) times daily. 05/19/23  Yes Vassie Loll, MD  glipiZIDE (GLUCOTROL XL) 5 MG 24 hr tablet Take 5 mg by mouth daily with breakfast.   Yes [provider]  LANTUS SOLOSTAR 100 UNIT/ML Solostar Pen Inject 40 Units into the skin in the morning. 08/23/16  Yes [provider]  losartan (COZAAR) 50 MG tablet Take 50 mg by mouth in the morning.   Yes [provider]  metFORMIN (GLUCOPHAGE) 1000 MG tablet Take 1,000 mg by mouth 2 (two) times daily. 07/22/16   Yes [provider]  NOVOLOG FLEXPEN 100 UNIT/ML FlexPen Inject 6 Units into the skin 2 (two) times daily with a meal. 02/05/23  Yes [provider]  pantoprazole (PROTONIX) 40 MG tablet TAKE 1 TABLET BY MOUTH ONE TIME DAILY 30-60 MINUTES BEFORE BREAKFAST 05/22/23  Yes Tiffany Kocher, PA-C  potassium chloride (MICRO-K) 10 MEQ CR capsule Take 10 mEq by mouth daily. 10/24/23  Yes [provider]  Ipratropium-Albuterol (COMBIVENT RESPIMAT) 20-100 MCG/ACT AERS respimat Inhale 1 puff into the lungs every 6 (six) hours as needed for wheezing or shortness of breath. 05/19/23   Vassie Loll, MD  Pirfenidone 267 MG TABS Take 1 tab three times daily for 7 days, then 2 tabs three times daily thereafter **low dose as maintenance** 12/15/23   Kalman Shan, MD    Past Medical History: Past Medical History:  Diagnosis Date   Arthritis    Barrett's esophagus    With low-grade dysplasia, following with [facility]   Diabetes mellitus    GERD (gastroesophageal reflux disease)    Hypercholesteremia     Past Surgical History: Past Surgical History:  Procedure Laterality Date   BACK SURGERY  2000   CHOLECYSTECTOMY     COLONOSCOPY  09/15/2011   Dr. Jena Gauss: Suboptimal prep. Pancolonic diverticulosis. Tubular adenomas. Next colonoscopy 5 years.   COLONOSCOPY N/A 09/22/2016   Surgeon: Corbin Ade, MD; pancolonic diverticulosis, otherwise normal exam.  Recommended repeat  in 5 years.   COLONOSCOPY WITH PROPOFOL N/A 09/29/2021   Procedure: COLONOSCOPY WITH PROPOFOL;  Surgeon: Corbin Ade, MD;  [facility];  Service: Endoscopy;  Laterality: N/A;  9:00am   ESOPHAGOGASTRODUODENOSCOPY N/A 08/31/2017   Surgeon: Corbin Ade, MD;  Barrett's esophagus with low-grade dysplasia, small hiatal hernia with abnormal gastric mucosa biopsied (no H. pylori), normal examined duodenum.     ESOPHAGOGASTRODUODENOSCOPY  11/2018   [facility]; Barrett's esophagus, negative for dysplasia    ESOPHAGOGASTRODUODENOSCOPY  01/2020   [facility]; Barrett's esophagus, indefinite for focal low-grade dysplasia.   GIVENS CAPSULE STUDY N/A 10/26/2017   Surgeon: Corbin Ade, MD; localized clustered areas of white "blunted appearing" abnormal small bowel mucosa/villi of uncertain significance.  Follow-up CTE and small bowel enteroscopy at [facility] without significant findings.    GIVENS CAPSULE STUDY N/A 05/15/2023   Procedure: GIVENS CAPSULE STUDY;  Surgeon: Corbin Ade, MD;  Location: [facility];  Service: Endoscopy;  Laterality: N/A;  730am   HERNIA REPAIR     as a child   left cornea transplant Left    POLYPECTOMY  09/29/2021   Procedure: POLYPECTOMY;  Surgeon: Corbin Ade, MD;  Location: [facility];  Service: Endoscopy;;   Right cornea transplant     SMALL BOWEL ENTEROSCOPY  11/2017   [facility]; Barrett's esophagus focally positive for low-grade dysplasia, normal stomach, normal duodenum, normal jejunum.  Small bowel biopsies were benign.   TONSILLECTOMY      Family History:  Family History  Problem Relation Age of Onset   Heart disease Mother    Heart disease Father    Emphysema Father        smoked   Lung cancer Father        smoked   Colon cancer Neg Hx     Social History: Social History   Socioeconomic History   Marital status: Married    Spouse name: Not on file   Number of children: Not on file   Years of education: Not on file   Highest education level: Not on file  Occupational History   Not on file  Tobacco Use   Smoking status: Former    Current packs/day: 0.00    Average packs/day: 2.0 packs/day for 36.0 years (72.0 ttl pk-yrs)    Types: Cigarettes    Start date: 11/27/1962    Quit date: 11/27/1998    Years since quitting: 25.0   Smokeless tobacco: Never  Vaping Use   Vaping status: Never Used  Substance and Sexual Activity   Alcohol use: No   Drug use: No   Sexual activity: Not Currently  Other Topics Concern   Not on file  Social History Narrative    Not on file   Social Drivers of Health   Financial Resource Strain: Not on file  Food Insecurity: No Food Insecurity (05/18/2023)   Hunger Vital Sign    Worried About Running Out of Food in the Last Year: Never true    Ran Out of Food in the Last Year: Never true  Transportation Needs: No Transportation Needs (05/18/2023)   PRAPARE - Administrator, Civil Service (Medical): No    Lack of Transportation (Non-Medical): No  Physical Activity: Not on file  Stress: Not on file  Social Connections: Not on file    Allergies:  No Known Allergies  Objective:    Vital Signs:   Temp:  [98.2 F (36.8 C)] 98.2 F (36.8 C) (01/20 1129)  Pulse Rate:  [130] 130 (01/20 1129) Resp:  [22] 22 (01/20 1129) BP: (112)/(80) 112/80 (01/20 1129) SpO2:  [90 %] 90 % (01/20 1206) Weight:  [74.8 kg] 74.8 kg (01/20 1129)   Filed Weights   12/17/23 1129  Weight: 74.8 kg     Physical Exam     General:  Well appearing. No respiratory difficulty HEENT: Normal Neck: Supple. no JVD. Carotids 2+ bilat; no bruits. No lymphadenopathy or thyromegaly appreciated. Cor: PMI nondisplaced. Regular rate & rhythm. No rubs, gallops or murmurs. Lungs: Dry crackles Abdomen: Soft, nontender, nondistended. No hepatosplenomegaly. No bruits or masses. Good bowel sounds. Extremities: No cyanosis, clubbing, rash, edema Neuro: Alert & oriented x 3, cranial nerves grossly intact. moves all 4 extremities w/o difficulty. Affect pleasant.   Labs     Basic Metabolic Panel: Recent Labs  Lab 12/17/23 1118  NA 137  K 4.5  CL 101  CO2 20*  GLUCOSE 190*  BUN 17  CREATININE 1.41*  CALCIUM 9.8    Liver Function Tests: No results for input(s): "AST", "ALT", "ALKPHOS", "BILITOT", "PROT", "ALBUMIN" in the last 168 hours. No results for input(s): "LIPASE", "AMYLASE" in the last 168 hours. No results for input(s): "AMMONIA" in the last 168 hours.  CBC: Recent Labs  Lab 12/17/23 1118  WBC 14.2*  HGB  15.1  HCT 46.1  MCV 90.0  PLT 430*    Cardiac Enzymes: No results for input(s): "CKTOTAL", "CKMB", "CKMBINDEX", "TROPONINI" in the last 168 hours.  BNP: BNP (last 3 results) Recent Labs    07/27/23 1112  BNP 75.5    ProBNP (last 3 results) No results for input(s): "PROBNP" in the last 8760 hours.   CBG: Recent Labs  Lab 12/17/23 1134  GLUCAP 177*    Coagulation Studies: No results for input(s): "LABPROT", "INR" in the last 72 hours.  Imaging: No results found.    Assessment/Plan   Patient with ILD, here for RHC to assess for pulmonary hypertension.    Marca Ancona, MD 12/17/2023, 12:18 PM  Advanced Heart Failure Team Pager [phone] (M-F; 7a - 5p)  Please contact [facility] for night-coverage after hours (4p -7a ) and weekends on [email or web address]

## 2023-12-17 NOTE — Discharge Instructions (Signed)

## 2023-12-18 ENCOUNTER — Encounter (HOSPITAL_COMMUNITY): Payer: Self-pay | Admitting: Cardiology

## 2023-12-18 NOTE — Telephone Encounter (Signed)
Spoke with patient's wife regarding Tyvaso. She has been advised of most common side effects and access process (including approval, costs, grant enrollment, specialty pharmacy coordination, and home nurse visits once approved).  Tyvaso form mailed to patient's home. Provider form placed on Dr. Jane Canary mailbox desk for signature today  Chesley Mires, PharmD, MPH, BCPS, CPP Clinical Pharmacist (Rheumatology and Pulmonology)

## 2023-12-19 ENCOUNTER — Other Ambulatory Visit: Payer: Self-pay

## 2023-12-19 ENCOUNTER — Other Ambulatory Visit (HOSPITAL_COMMUNITY): Payer: Self-pay

## 2023-12-19 NOTE — Progress Notes (Signed)
Specialty Pharmacy Initial Fill Coordination Note  STRUMMER POLMAN is a 78 y.o. male contacted today regarding initial fill of specialty medication(s) Pirfenidone   Patient requested Delivery   Delivery date: 12/21/23   Verified address: [address]   [place] [address]   Medication will be filled on 12/20/23.   Patient has grant on file and is aware of $0 copayment.

## 2023-12-20 ENCOUNTER — Telehealth: Payer: Self-pay | Admitting: Pharmacist

## 2023-12-20 DIAGNOSIS — I2723 Pulmonary hypertension due to lung diseases and hypoxia: Secondary | ICD-10-CM

## 2023-12-20 NOTE — Telephone Encounter (Signed)
Received signed provider forms for Tyvaso DPI. Placed in awaiting response folder in pharmacy office

## 2023-12-20 NOTE — Telephone Encounter (Signed)
Patient is new start to Tyvaso DPI for WHO Group 3 PH  Submitted a Prior Authorization request to [facility] for TYVASO DPI via CoverMyMeds. Will update once we receive a response.  Key: [id number]   Spoke with patient's wife regarding Tyvaso. She has been advised of most common side effects and access process (including approval, costs, grant enrollment, specialty pharmacy coordination, and home nurse visits once approved).   Tyvaso form mailed to patient's home. Provider form placed on Dr. Jane Canary mailbox desk for signature today   Chesley Mires, PharmD, MPH, BCPS, CPP Clinical Pharmacist (Rheumatology and Pulmonology)

## 2023-12-21 ENCOUNTER — Telehealth: Payer: Self-pay

## 2023-12-21 NOTE — Telephone Encounter (Signed)
Patient informed and PCP copied. Patient stated he will hold off on taking Diltiazem right now he has a lot of medications and cannot afford right now and he was going to speak with some one in regards to assistance. Will call back to let us know when to send in. Informed provider as well

## 2023-12-21 NOTE — Telephone Encounter (Signed)
-----   Message from Vishnu P Mallipeddi sent at 12/20/2023  4:17 PM EST ----- 22% PAC burden, 1710 runs of likely atrial tachycardia with variable AV block and 1 run of NSVT lasting 4 beats.  No patient triggered events noted. He will benefit from initiation of diltiazem 180 mg once daily to prevent progression into A-fib.  Side effect includes hypotension. Hold for BP<100 mmHg SBP.

## 2024-01-02 NOTE — Telephone Encounter (Addendum)
 Received signed patient form.  Submitted completed referral paperwork to [employer] for TYVASO  along with HRCT, right heart cath results, and most recent progress note.  Fax# [phone] Phone# [phone]  Sherry Pennant, PharmD, MPH, BCPS, CPP Clinical Pharmacist (Rheumatology and Pulmonology)  No PA response on CMM. Called [employer] to complete clinicals over the phone.  [employer] phone: [phone] EOC # [id number]  Once PA is approved, need to send to to same fax as above  Sherry Pennant, PharmD, MPH, BCPS, CPP Clinical Pharmacist (Rheumatology and Pulmonology)

## 2024-01-03 NOTE — Telephone Encounter (Signed)
 Received notification from [employer] regarding a prior authorization for TYVASO  DPI. Authorization has been APPROVED from 01/02/2024 to 11/26/2024. Approval letter sent to scan center.  Authorization faxed to [facility] (Fax# [phone])  Sherry Pennant, PharmD, MPH, BCPS, CPP Clinical Pharmacist (Rheumatology and Pulmonology)

## 2024-01-08 ENCOUNTER — Other Ambulatory Visit: Payer: Self-pay | Admitting: Internal Medicine

## 2024-01-08 ENCOUNTER — Other Ambulatory Visit (HOSPITAL_COMMUNITY): Payer: Self-pay | Admitting: Pharmacy Technician

## 2024-01-08 ENCOUNTER — Other Ambulatory Visit (HOSPITAL_COMMUNITY): Payer: Self-pay

## 2024-01-08 DIAGNOSIS — J84112 Idiopathic pulmonary fibrosis: Secondary | ICD-10-CM

## 2024-01-08 NOTE — Progress Notes (Signed)
Specialty Pharmacy Refill Coordination Note  Christian Woodard is a 78 y.o. male contacted today regarding refills of specialty medication(s) Pirfenidone   Patient requested Delivery   Delivery date: 01/15/24   Verified address: [address]  [place]   Medication will be filled on 01/14/24.  RR sent to MD

## 2024-01-10 ENCOUNTER — Other Ambulatory Visit: Payer: Self-pay | Admitting: Internal Medicine

## 2024-01-10 ENCOUNTER — Other Ambulatory Visit: Payer: Self-pay

## 2024-01-10 DIAGNOSIS — J84112 Idiopathic pulmonary fibrosis: Secondary | ICD-10-CM

## 2024-01-11 ENCOUNTER — Other Ambulatory Visit: Payer: Self-pay

## 2024-01-11 MED ORDER — PIRFENIDONE 267 MG PO TABS
534.0000 mg | ORAL_TABLET | Freq: Three times a day (TID) | ORAL | 2 refills | Status: DC
  Filled 2024-01-11: qty 180, 30d supply, fill #0
  Filled 2024-02-08 (×2): qty 180, 30d supply, fill #1
  Filled 2024-03-18: qty 180, 30d supply, fill #2

## 2024-01-15 ENCOUNTER — Encounter: Payer: Self-pay | Admitting: Internal Medicine

## 2024-01-15 ENCOUNTER — Ambulatory Visit: Payer: Medicare HMO | Admitting: Internal Medicine

## 2024-01-15 ENCOUNTER — Telehealth: Payer: Self-pay | Admitting: Internal Medicine

## 2024-01-15 VITALS — BP 99/68 | HR 93 | Ht 66.0 in | Wt 173.0 lb

## 2024-01-15 DIAGNOSIS — Z5181 Encounter for therapeutic drug level monitoring: Secondary | ICD-10-CM | POA: Diagnosis not present

## 2024-01-15 DIAGNOSIS — I2723 Pulmonary hypertension due to lung diseases and hypoxia: Secondary | ICD-10-CM | POA: Diagnosis not present

## 2024-01-15 DIAGNOSIS — J84112 Idiopathic pulmonary fibrosis: Secondary | ICD-10-CM

## 2024-01-15 DIAGNOSIS — Z79899 Other long term (current) drug therapy: Secondary | ICD-10-CM | POA: Diagnosis not present

## 2024-01-15 DIAGNOSIS — J9611 Chronic respiratory failure with hypoxia: Secondary | ICD-10-CM

## 2024-01-15 LAB — HEPATIC FUNCTION PANEL
ALT: 21 U/L (ref 0–53)
AST: 27 U/L (ref 0–37)
Albumin: 4.4 g/dL (ref 3.5–5.2)
Alkaline Phosphatase: 81 U/L (ref 39–117)
Bilirubin, Direct: 0.2 mg/dL (ref 0.0–0.3)
Total Bilirubin: 0.9 mg/dL (ref 0.2–1.2)
Total Protein: 7.9 g/dL (ref 6.0–8.3)

## 2024-01-15 LAB — BRAIN NATRIURETIC PEPTIDE: Pro B Natriuretic peptide (BNP): 165 pg/mL — ABNORMAL HIGH (ref 0.0–100.0)

## 2024-01-15 NOTE — Patient Instructions (Addendum)
ICD-10-CM   1. IPF (idiopathic pulmonary fibrosis) (HCC)  J84.112 Hepatic function panel    B Nat Peptide    2. WHO group 3 pulmonary arterial hypertension (HCC)  I27.23 Hepatic function panel    B Nat Peptide    3. Chronic respiratory failure with hypoxia (HCC)  J96.11 Hepatic function panel    B Nat Peptide    4. High risk medication use  Z79.899 Hepatic function panel    B Nat Peptide    5. Encounter for therapeutic drug monitoring  Z51.81 Hepatic function panel    B Nat Peptide      Chronic hypoxemic respiratory failure  -This is because of a combination of pulmonary fibrosis IPF and also pulmonary hypertension diagnosed January 2025 -Finger pulse ox is inaccurate because forehead on room air at rest was 92%. -CT scan of the sinus was normal  Plan - Continue oxygen to keep pulse ox goal greater than 88%  -- Use 3-5 L of oxygen  -At some point we can consider pulmonary rehabilitation  ILD/IPF -IPF diagnosis given December 2024 High risk prescription Encounter for therapeutic monitoring  -Glad you are tolerating low-dose pirfenidone protocol well since January 2025   PLAN -Continue Esbriet 2 pills 3 times daily with food and please to apply sunscreen when you go out -Check liver function test today and then monthly for the next 3 months [latter can be done at [place]]   WHO group 3 pulmonary hypertension =-diagnosed January 2025  Plan  - Start inhaled Tyvaso per protocol [anytime this week]   22% PAC burden, 1710 runs of likely atrial tachycardia with variable AV block and 1 run of NSVT lasting 4 beats - Jan 2025 noticed by Dr Jenene Slicker  -Noted cardiac stress test was normal - Noted reluctance to strt cardizem as recommended and desiring a 2nd opinion   Plan -Hold off cardizem for now - Call Cardiology office and request transfer of care to Dr Diona Browner -I have also reached out to him  Followup -4 weeks video visit Dr. Marchelle Gearing nurse practitioner -to  monitor uptake with both pirfenidone and time also and side effect profile. -8 weeks spirometry and DLCO in 30-minute visit with Dr. Marchelle Gearing

## 2024-01-15 NOTE — Telephone Encounter (Signed)
Received fax from [facility]. First shipment of Tyvaso was dispensed on 01/14/2024  Chesley Mires, PharmD, MPH, BCPS, CPP Clinical Pharmacist (Rheumatology and Pulmonology)

## 2024-01-15 NOTE — Telephone Encounter (Signed)
Dear Docia Barrier I Kyra Leyland - was recommended cardizem for NSVT runs but he held off given al the pulmonary stuff and wants 2nd opinion. [facility] will be calling for a switch to you  Thanks    SIGNATURE    Dr. Kalman Shan, M.D., F.C.C.P,  Pulmonary and Critical Care Medicine Staff Physician, [facility] Director - Interstitial Lung Disease  Program  Pulmonary Fibrosis [facility] Network at [facility] [place], [place], [address]   Pager: [phone], If no answer  -> Check AMION or Try [phone] Telephone (clinical office): [phone] Telephone (research): [phone]  11:38 AM 01/15/2024

## 2024-01-15 NOTE — Progress Notes (Signed)
Brief patient profile:  78 yowm quit smoking 2000 with mild doe @ wt 156  and did fine until around 2013 gave up pushing mower and gradually downhill since referred to pulmonary clinic 06/14/2017 by Dr   Christian Woodard   06/14/2017 1st [facility] office visit/ [facility]  No resp rx  Chief Complaint  Patient presents with   Pulmonary Consult    Referred by Dr. Carylon Woodard. Pt c/o SOB x 6 months, esp worse over the past 3 months. He states he gets out of breath walking an incline, such as to his mailbox. He states that he sometimes he wakes up in the night feeling SOB.  He also c/o non prod cough.   doe x 5 years,  Stops at top of incline on way back to house x 6 months  = MMRC1 = can walk nl pace, flat grade, can't hurry or go uphills or steps s sob   Never tried inhalers Worse in heat and humbidity  freq wakes with dry cough / chest tightness sits up and goes away  rec You do not have significant copd at this point and likely never will  Pantoprazole (protonix) 40 mg   Take  30-60 min before first meal of the day and Pepcid (famotidine)  20 mg one @  bedtime until return to office - this is the best way to tell whether stomach acid is contributing to your problem.   GERD diet     08/20/2017  f/u ov/[facility] re: doe on gerd rx/ gi eval in progress for anemia/ no resp rx  Chief Complaint  Patient presents with   Follow-up    PFT's done today. Increased SOB for the past day. Chest feels heavy today.  continues to have noct spells of sob despite nl BNP and no evidence of chf on cxr or obvious anemia Tells me his hgb still trending down and GI eval in progess rec You do not have any significant copd If you continue to have breathing issues after your anemia is corrected might consider trial off ACTOS next and /or cardiology evaluation    Admission date:  10/03/2019     Discharge Date:  10/07/2019   Discharge Diagnosis  AKI (acute kidney injury) (HCC) [N17.9] COVID-19 virus infection [U07.1]      Acute respiratory failure with hypoxia (HCC)   Acute respiratory disease due to COVID-19 virus   Type 2 diabetes mellitus without complication (HCC)   Essential hypertension      10/27/19 History of Present Illness: Dyspnea:  2 laps off 02 prior to discharge / trend is improving at home but not checking sats  Cough: minimal clear  Sleeping: flat/ one pillow SABA use: none 02: none  Dex last day 10/12/2019 rec Make sure you check your oxygen saturation at your highest level of activity to be sure it stays over 90% and keep track of it at least once a week, more often if breathing getting worse, and let me know if losing ground.>>> did not do       01/19/2021  f/u ov/[place] office/[facility] re: doe p covid  Chief Complaint  Patient presents with   Follow-up    Breathing is overall doing well and no new co's today.    Dyspnea:  Up and down the hill to the mb daily feels it getting a little harder than it was about a month ago since feet started bothering him with paresthesias and lack of balance  Cough:  none/ some p eating assoc with mild dysphagia ? Barrett's  Sleeping: flat bed / R side / 1 pillow SABA use: none  02: none Covid status: vax x 3  rec Stay as active as you can and call if losing ground with exercise tolerance  Pulmonary follow up is as directed by Dr Christian Woodard  Late add:  Based on desats with no explanation and c/o worse ex tol > HRCT/pfts next step > no specific findings/ 7 mm nodule needs f/u @ 6-12 m     04/2023 started on 02    06/29/2023  post hosp f/u ov/[place] office/[facility] re: doe/ cough p covid 09/2019 maint on symbicort 160  better since admit  Chief Complaint  Patient presents with   Shortness of Breath  Dyspnea:  has not  tried mailbox yet, just room to room and not titraitng 02  Cough: no change baseline  Sleeping: bed is flat/ high pillow under head  SABA use: combivent  02: 4lpm cont since admit  Rec Continue 4lpm at bedtime for now   Make sure you check your oxygen saturation  AT  your highest level of activity (not after you stop)   to be sure it stays over 90%   Please schedule a follow up office visit in 4 weeks, sooner if needed  with all medications /inhalers/ solutions in hand    07/27/2023  f/u ov/[place] office/[facility] re: post covid resp failure maint on symbicort/ prn combivent  f/u hrct done 07/23/23  Chief Complaint  Patient presents with   Dyspnea on exertion  Dyspnea:  last mailbox trip was prior to June 2024 100 ft with hills - wife worried about his balance   Cough: not a problem  Sleeping: bed is flat s  resp cc  SABA use:  not since last ov  02: 4lpm cont s humidity  Rec My office will be contacting you by phone for referral to [facility] for best fit evaluation for portable 02 and humdity for 02   Try breztri (or symbicort)  Take 2 puffs first thing in am and then another 2 puffs about 12 hours later.   Work on inhaler technique:   >>>  Remember how golfers warm up by taking practice swings - do this with an empty inhaler  I emphasized that nasal steroids (Nasacort AQ) have no immediate benefit  Please schedule a follow up visit in 3 months but call sooner if needed   - HRCT 07/23/23  c/w mild progressive since  c/w Prob UIP > referred to PF clinic 07/30/2023     OV 09/03/2023 transfer of care from Dr. Sandrea Hughs to Dr. Marchelle Gearing in the ILD center.  Subjective:  Patient ID: Christian Woodard, Christian Woodard , DOB: [birth year] , age 78 y.o. , MRN: [id number] , AD[address]ESS: [address] [place] [id number] PCP Christian Perches, MD Patient Care Team: Christian Perches, MD as PCP - General (Internal Medicine) Christian Gauss Gerrit Friends, MD as Consulting Physician (Gastroenterology) Christian Cowden, MD as Consulting Physician (Pulmonary Disease)  This Provider for this visit: Treatment Team:  Attending Provider: Kalman Shan, MD    09/03/2023 -   Chief Complaint  Patient presents with   Pulmonary Consult     Referred by Dr.  Sherene Woodard for IPF.      HPI Christian Woodard 78 y.o. -presents on referral from Dr. er.  History is taken from the patient, his wife's granddaughter who stated bedside and also reviewed the records.  He tells  me that in 2020 he had COVID-19 he was hospitalized at [facility] here in [place].  This was then the COVID hospital.  He was treated with oxygen and then he got better.  And then he was almost back to baseline according to the wife.  Occasionally gets short of breath.  Then starting in April May 2024 he started noticing shortness of breath going to the mailbox and coming back.  This then resulted in hospitalization for 1 day at [facility]..  Much of this history was confirmed there.  He had new onset hypoxemia and discharged on 3-4 L oxygen which she continues to use but there is a problem checking his pulse ox because the finger tracings are very poor at home.  He has continued this amount of oxygen.  The finger pulse ox would not pick up at all.  I turned his oxygen off and his pulse ox today was 88-91% on room air with a forehead probe [corresponding finger probe was 55%].  Review of the records indicate that he did have history of chronic Barrett's and's esophagus.  In the hospital iron deficiency anemia hemoglobin 12.5 g was noticed.  He was started on supplemental iron.  He underwent endoscopy later in July 2024 at [facility].  Barrett's esophagus with low-grade dysplasia was confirmed.  In the hospital he was discharged with a diagnosis of acute/subacute hypoxemic respiratory failure secondary to pulmonary fibrosis.  Also diastolic heart failure.  An echo in the hospital showed EF 65 to 70%.  It appears after that he was diuresed.  A follow-up BNP in August 2024 is now.    He did undergo follow-up high-resolution CT chest July 23, 2023: It shows definite presence of ILD in the bases and a craniocaudal gradient.  It is described as probable UIP but I wonder if this is all  indeterminate.  He had a previous CT chest in June 2023 that did have some mild ILD findings and before that in February 2022 which was mostly ILA/nearly normal in my view.  He has had abnormal PFTs with a low DLCO is 2018 last PFT 2022 and between 2018 and 2022 DLCO reduced and overall stable.  His current CT does show coronary artery calcification.  Twelve-lead EKG today because he was tachycardic and irregular showed PVC but sinus rhythm otherwise.  It appears no change from baseline.  I personally visualized this.  CT Chest data from date: 07/23/23  - personally visualized and independently interpreted : YES - my findings are: as below Narrative & Impression  CLINICAL DATA:  78 year old Christian Woodard with history of shortness of breath for the past 6 months, worsening over the past 3 months.   EXAM: CT CHEST WITHOUT CONTRAST   TECHNIQUE: Multidetector CT imaging of the chest was performed following the standard protocol without intravenous contrast. High resolution imaging of the lungs, as well as inspiratory and expiratory imaging, was performed.   RADIATION DOSE REDUCTION: This exam was performed according to the departmental dose-optimization program which includes automated exposure control, adjustment of the mA and/or kV according to patient size and/or use of iterative reconstruction technique.   COMPARISON:  Chest CT 05/25/2022. High-resolution chest CT 01/21/2021.   FINDINGS: Cardiovascular: Heart size is normal. There is no significant pericardial fluid, thickening or pericardial calcification. There is aortic atherosclerosis, as well as atherosclerosis of the great vessels of the mediastinum and the coronary arteries, including calcified atherosclerotic plaque in the left main, left anterior descending, left circumflex and  right coronary arteries.   Mediastinum/Nodes: No pathologically enlarged mediastinal or hilar lymph nodes. Please note that accurate exclusion of hilar  adenopathy is limited on noncontrast CT scans. Esophagus is unremarkable in appearance. No axillary lymphadenopathy.   Lungs/Pleura: High-resolution images demonstrate widespread areas of mild ground-glass attenuation, septal thickening, scattered subpleural reticulation, mild cylindrical bronchiectasis and some peripheral bronchiolectasis. These findings have a mild craniocaudal gradient and are mildly progressive compared to prior examinations. No frank honeycombing confidently identified. Inspiratory and expiratory imaging demonstrates some very mild air trapping, indicative of mild small airways disease. No acute consolidative airspace disease. No pleural effusions.   Upper Abdomen: Aortic atherosclerosis.  Status post cholecystectomy.   Musculoskeletal: There are no aggressive appearing lytic or blastic lesions noted in the visualized portions of the skeleton.   IMPRESSION: 1. Mild progression of interstitial lung disease with a spectrum of findings considered probable usual interstitial pneumonia (UIP) on today's examination. 2. Aortic atherosclerosis, in addition to left main and three-vessel coronary artery disease. Please note that although the presence of coronary artery calcium documents the presence of coronary artery disease, the severity of this disease and any potential stenosis cannot be assessed on this non-gated CT examination. Assessment for potential risk factor modification, dietary therapy or pharmacologic therapy may be warranted, if clinically indicated.   Aortic Atherosclerosis (ICD10-I70.0).     Electronically Signed   By: Trudie Reed M.D.   On: 07/30/2023 10:59      Result History   OV 11/07/2023  Subjective:  Patient ID: Christian Woodard, Christian Woodard , DOB: 01/12/1946 , age 15 y.o. , MRN: [id number] , AD[address]ESS: [address] [place] [id number] PCP Christian Perches, MD Patient Care Team: Christian Perches, MD as PCP - General (Internal  Medicine) Marjo Bicker, MD as PCP - Cardiology (Cardiology) Christian Gauss Gerrit Friends, MD as Consulting Physician (Gastroenterology) Christian Cowden, MD as Consulting Physician (Pulmonary Disease)  This Provider for this visit: Treatment Team:  Attending Provider: Kalman Shan, MD    11/07/2023 -   Chief Complaint  Patient presents with   Follow-up    Pft f/u, using breztri family denies concerns. Using 4-5 L      HPI DILAN NOVOSAD 78 y.o. -ILD and out of proportion respiratory failure hypoxemic workup in progress  Presents for follow-up with his wife and daughter/granddaughterr, they again reiterated that oxygen requirements only since May/June 2024 particularly June 2024.  He is requiring 4 to 6 L at all times.  Last time he had problems figure out his true pulse ox because of the finger probe.  This time we used a forehead probe and sure enough room air pulse ox quickly desaturated to 85%.  Even on 3 to 4 L he was 89% - 90% at rest.  He tells me no new medical issues since last visit but his sinuses have been congested and he feels like completely blocked.  In terms of his ILD and out of proportion hypoxemic respiratory failure he had autoimmune panel and serology and this is normal.  I given the ILD questionnaire to do but he has not done that.  He did have pulmonary function test his FVC is rock steady stable since 2018 through December 2024 but his DLCO has declined but only modestly from 55% to 45% over 6 years.  I did explain to him just isolated reduction in DLCO 45% typically in my clinical experience does not correlate with requiring 4 to 5 L of oxygen but indeed he does  have hypoxemia.  I again visualized the CT scan of the chest with him and his family.  I agree there is slight progression in ILD over time but again the overall burden appears very mild CAD scale to moderate.  Review of the records indicate that his last echo in summer 2024 did not show any evidence of cor  pulmonale.  Noticed that he has never had a bubble study.  Noticed he is not had a right heart catheterization.  I have not reached out to the heart failure service to consider getting a right heart catheterization.  Also ordered bubble study.  In terms of his fibrosis discussed antifibrotic's with nintedanib and pirfenidone [see below] based on this event with pirfenidone.  Will start low-dose protocol to make sure he is not having any side effects and then slowly escalate to full dose.   Esbriet/Pirfenidone requires intensive drug monitoring due to high concerns for Adverse effects of , including  Drug Induced Liver Injury, significant GI side effects that include but not limited to Diarrhea, Nausea, Vomiting,  and other system side effects that include Fatigue, headaches, weight loss and other side effects such as skin rash. These will be monitored with  blood work such as LFT initially once a month for 6 months and then quarterly  Nintedanib/Ofev requires intensive drug monitoring due to high concerns for Adverse effects of , including  Drug Induced Liver Injury, significant GI side effects that include but not limited to Diarrhea, Nausea, Vomiting,  and other system side effects that include Fatigue,  weight loss. Cardiac side effects are a black box warning as well. These will be monitored with  blood work such as LFT initially once a month for 6 months and then quarterly   Coronary calcification: I did have him see cardiology in [place].  He saw Dr.Mallipeddi.  He had the study on 10/30/2023.  Normal study.     OV 01/15/2024  Subjective:  Patient ID: Christian Woodard, Christian Woodard , DOB: 11-21-1946 , age 2 y.o. , MRN: [id number] , AD[address]ESS: [address] [place] [id number] PCP Christian Perches, MD Patient Care Team: Christian Perches, MD as PCP - General (Internal Medicine) Marjo Bicker, MD as PCP - Cardiology (Cardiology) Corbin Ade, MD as Consulting Physician (Gastroenterology) Christian Cowden, MD as Consulting Physician (Pulmonary Disease)  This Provider for this visit: Treatment Team:  Attending Provider: Kalman Shan, MD   #IPF - diagnosis given Dec 2024 and reco pirfenidone Rx snce Jan 2025 [low-dose protocol]  #Esbriet/Pirfenidone requires intensive drug monitoring due to high concerns for Adverse effects of , including  Drug Induced Liver Injury, significant GI side effects that include but not limited to Diarrhea, Nausea, Vomiting,  and other system side effects that include Fatigue, headaches, weight loss and other side effects such as skin rash. These will be monitored with  blood work such as LFT initially once a month for 6 months and then quarterly  #WHO-3 PAHx diagnosed Dec 17, 2023  - Normal filling pressures  - Moderate Pulmonary Hypertension - PA mean 41, PCWP 6, PRVR 8.80  - Lowish CI 2.15   - started TYVASO - date 01/17/24 PENDING  01/15/2024 -   Chief Complaint  Patient presents with   Follow-up    Pt states has a few questions about meds.,      HPI YOLANDA DOCKENDORF 78 y.o. -presents for follow-up with his wife and with his daughter.  Daughter is the main historian.  He attest that he is stable.  He is using 3 L oxygen at rest.  When I turned off room air he was 75% with a finger probe but with the following problems 92% room air at rest.  He is now taking pirfenidone.  He is on a low-dose protocol which is to 3 times daily.  He just got to it a few weeks ago.  He is tolerating it fine no side effects.  From his respiratory standpoint is not deteriorated.  He did have right heart catheterization.  He does have pulmonary hypertension.  Results are above.  He has been started on Tyvaso for group 3 pulmonary hypertension but this actual start is pending orientation.  He has a drug at home.  Is going to be done in the next few days.  If this fails they know they can enter into a study for group 3 pulmonary hypertension sponsored by [employer] note they are wondering if they should continue seeing cardiology.  He had a normal cardiac stress test.  He has completed his right heart cath.  However noticed that because of his tachycardia he did have a rhythm check and he is possible atrial tachycardia Cardizem was recommended in January 2025 by Dr. Mikal Plane but he is reluctant to do it especially in the setting of lung disease.  Therefore he wants a second opinion.  I recommended Dr. Simona Huh and have reached out to him.  External records reviewed.  SYMPTOM SCALE - ILD 11/07/2023 - IPF dx 01/15/2024   Current weight    O2 use ra 3L at res but is 92% RA at rest with the forehead probe 75% with a finger probe  Shortness of Breath 0 -> 5 scale with 5 being worst (score 6 If unable to do)   At rest 2 2  Simple tasks - showers, clothes change, eating, shaving 5 5  Household (dishes, doing bed, laundry) 3 3  Shopping 4 5  Walking level at own pace 3 3  Walking up Stairs 4 5  Total (30-36) Dyspnea Score 21 23  How bad is your cough? 1 1  How bad is your fatigue 1 2  How bad is nausea 00 0  How bad is vomiting?  0 0  How bad is diarrhea? 00 0  How bad is anxiety? 0 0  How bad is depression 0 0  Any chronic pain - if so where and how bad x 0   PFT     Latest Ref Rng & Units 11/07/2023    1:54 PM 01/31/2021    1:28 PM 08/20/2017    8:36 AM  PFT Results  FVC-Pre L 2.89  2.87  2.92   FVC-Predicted Pre % 90  81  80   FVC-Post L  2.97  2.93   FVC-Predicted Post %  84  80   Pre FEV1/FVC % % 76  73  75   Post FEV1/FCV % %  75  76   FEV1-Pre L 2.19  2.10  2.18   FEV1-Predicted Pre % 97  83  82   FEV1-Post L  2.23  2.23   DLCO uncorrected ml/min/mmHg 8.79  10.30  11.95   DLCO UNC% % 42  47  45   DLCO corrected ml/min/mmHg 9.26  63.39  14.67   DLCO COR %Predicted % 45  290  55   DLVA Predicted % 54  337  78   TLC L  5.16  4.82   TLC % Predicted %  84  78   RV % Predicted %  89  86        LAB RESULTS  last 96 hours No results found.  ECHO Jan 2025   IMPRESSIONS     1. Left ventricular ejection fraction, by estimation, is 55 to 60%. The  left ventricle has normal function. The left ventricle has no regional  wall motion abnormalities. There is mild left ventricular hypertrophy.  Left ventricular diastolic parameters  are consistent with Grade I diastolic dysfunction (impaired relaxation).   2. Right ventricular systolic function is mildly reduced. The right  ventricular size is mildly enlarged.   3. Right atrial size was mildly dilated.   4. The mitral valve is normal in structure. No evidence of mitral valve  regurgitation. No evidence of mitral stenosis.   5. The aortic valve is tricuspid. There is mild calcification of the  aortic valve. Aortic valve regurgitation is not visualized. Aortic valve  sclerosis is present, with no evidence of aortic valve stenosis.   6. The inferior vena cava is normal in size with greater than 50%  respiratory variability, suggesting right atrial pressure of 3 mmHg.      has a past medical history of Arthritis, Barrett's esophagus, Diabetes mellitus, GERD (gastroesophageal reflux disease), and Hypercholesteremia.   reports that he quit smoking about 25 years ago. His smoking use included cigarettes. He started smoking about 61 years ago. He has a 72 pack-year smoking history. He has never used smokeless tobacco.  Past Surgical History:  Procedure Laterality Date   BACK SURGERY  2000   CHOLECYSTECTOMY     COLONOSCOPY  09/15/2011   Dr. Jena Gauss: Suboptimal prep. Pancolonic diverticulosis. Tubular adenomas. Next colonoscopy 5 years.   COLONOSCOPY N/A 09/22/2016   Surgeon: Corbin Ade, MD; pancolonic diverticulosis, otherwise normal exam.  Recommended repeat in 5 years.   COLONOSCOPY WITH PROPOFOL N/A 09/29/2021   Procedure: COLONOSCOPY WITH PROPOFOL;  Surgeon: Corbin Ade, MD;  Location: [facility];  Service: Endoscopy;  Laterality: N/A;   9:00am   ESOPHAGOGASTRODUODENOSCOPY N/A 08/31/2017   Surgeon: Corbin Ade, MD;  Barrett's esophagus with low-grade dysplasia, small hiatal hernia with abnormal gastric mucosa biopsied (no H. pylori), normal examined duodenum.     ESOPHAGOGASTRODUODENOSCOPY  11/2018   [facility]; Barrett's esophagus, negative for dysplasia   ESOPHAGOGASTRODUODENOSCOPY  01/2020   [facility]; Barrett's esophagus, indefinite for focal low-grade dysplasia.   GIVENS CAPSULE STUDY N/A 10/26/2017   Surgeon: Corbin Ade, MD; localized clustered areas of white "blunted appearing" abnormal small bowel mucosa/villi of uncertain significance.  Follow-up CTE and small bowel enteroscopy at [facility] without significant findings.    GIVENS CAPSULE STUDY N/A 05/15/2023   Procedure: GIVENS CAPSULE STUDY;  Surgeon: Corbin Ade, MD;  Location: [facility];  Service: Endoscopy;  Laterality: N/A;  730am   HERNIA REPAIR     as a child   left cornea transplant Left    POLYPECTOMY  09/29/2021   Procedure: POLYPECTOMY;  Surgeon: Corbin Ade, MD;  Location: [facility];  Service: Endoscopy;;   Right cornea transplant     RIGHT HEART CATH N/A 12/17/2023   Procedure: RIGHT HEART CATH;  Surgeon: Laurey Morale, MD;  Location: [facility] INVASIVE CV LAB;  Service: Cardiovascular;  Laterality: N/A;   SMALL BOWEL ENTEROSCOPY  11/2017   [facility]; Barrett's esophagus focally positive for low-grade dysplasia, normal stomach, normal duodenum, normal jejunum.  Small bowel biopsies  were benign.   TONSILLECTOMY      No Known Allergies  Immunization History  Administered Date(s) Administered   Fluad Quad(high Dose 65+) 10/07/2019, 11/22/2021   Influenza-Unspecified 10/29/2017, 08/20/2023   Moderna SARS-COV2 Booster Vaccination 01/10/2021   Moderna Sars-Covid-2 Vaccination 02/26/2020, 03/30/2020   Tdap 01/23/2017    Family History  Problem Relation Age of Onset   Heart disease Mother    Heart disease Father    Emphysema Father        smoked   Lung  cancer Father        smoked   Colon cancer Neg Hx      Current Outpatient Medications:    acetaminophen (TYLENOL) 500 MG tablet, Take 500 mg by mouth every 6 (six) hours as needed (headaches/pain.)., Disp: , Rfl:    aspirin EC 81 MG tablet, Take 81 mg by mouth every evening. Swallow whole., Disp: , Rfl:    atorvastatin (LIPITOR) 10 MG tablet, Take 10 mg by mouth at bedtime., Disp: , Rfl:    budesonide-formoterol (SYMBICORT) 160-4.5 MCG/ACT inhaler, Inhale 2 puffs into the lungs in the morning and at bedtime., Disp: 1 each, Rfl: 3   chlorpheniramine (CHLOR-TRIMETON) 4 MG tablet, Take 4 mg by mouth daily., Disp: , Rfl:    cyanocobalamin (VITAMIN B12) 1000 MCG tablet, Take 1,000 mcg by mouth daily., Disp: , Rfl:    famotidine (PEPCID) 10 MG tablet, Take 10 mg by mouth every evening., Disp: , Rfl:    Ferrous Gluconate (IRON 27 PO), Take 1 tablet by mouth daily., Disp: , Rfl:    furosemide (LASIX) 20 MG tablet, Take 1 tablet (20 mg total) by mouth daily., Disp: 30 tablet, Rfl: 2   gabapentin (NEURONTIN) 300 MG capsule, Take 1 capsule (300 mg total) by mouth 2 (two) times daily., Disp: , Rfl:    glipiZIDE (GLUCOTROL XL) 5 MG 24 hr tablet, Take 5 mg by mouth daily with breakfast., Disp: , Rfl:    Ipratropium-Albuterol (COMBIVENT RESPIMAT) 20-100 MCG/ACT AERS respimat, Inhale 1 puff into the lungs every 6 (six) hours as needed for wheezing or shortness of breath., Disp: 4 g, Rfl: 2   LANTUS SOLOSTAR 100 UNIT/ML Solostar Pen, Inject 40 Units into the skin in the morning., Disp: , Rfl:    losartan (COZAAR) 50 MG tablet, Take 50 mg by mouth in the morning., Disp: , Rfl:    metFORMIN (GLUCOPHAGE) 1000 MG tablet, Take 1,000 mg by mouth 2 (two) times daily., Disp: , Rfl:    NOVOLOG FLEXPEN 100 UNIT/ML FlexPen, Inject 6 Units into the skin 2 (two) times daily with a meal., Disp: , Rfl:    pantoprazole (PROTONIX) 40 MG tablet, TAKE 1 TABLET BY MOUTH ONE TIME DAILY 30-60 MINUTES BEFORE BREAKFAST, Disp: 90  tablet, Rfl: 3   Pirfenidone 267 MG TABS, Take 2 tablets (534 mg total) by mouth in the morning, at noon, and at bedtime. **low dose as maintenance**, Disp: 180 tablet, Rfl: 2   potassium chloride (MICRO-K) 10 MEQ CR capsule, Take 10 mEq by mouth daily., Disp: , Rfl:       Objective:   Vitals:   01/15/24 1112  BP: 99/68  Pulse: 93  SpO2: 98%  Weight: 173 lb (78.5 kg)  Height: 5\' 6"  (1.676 m)    Estimated body mass index is 27.92 kg/m as calculated from the following:   Height as of this encounter: 5\' 6"  (1.676 m).   Weight as of this encounter: 173 lb (78.5 kg).  @WEIGHTCHANGE @  Filed Weights   01/15/24 1112  Weight: 173 lb (78.5 kg)     Physical Exam   General: No distress. Looks well  O2 at rest: yes Cane present: no Sitting in wheel chair: no Frail: no Obese: no Neuro: Alert and Oriented x 3. GCS 15. Speech normal Psych: Pleasant Resp:  Barrel Chest - no.  Wheeze - no, Crackles - mild yes, No overt respiratory distress CVS: Normal heart sounds. Murmurs - no Ext: Stigmata of Connective Tissue Disease - no HEENT: Normal upper airway. PEERL +. No post nasal drip        Assessment:       ICD-10-CM   1. IPF (idiopathic pulmonary fibrosis) (HCC)  J84.112 B Nat Peptide    Hepatic function panel    Hepatic function panel    Pulmonary function test    CANCELED: Hepatic function panel    CANCELED: B Nat Peptide    2. WHO group 3 pulmonary arterial hypertension (HCC)  I27.23 B Nat Peptide    Hepatic function panel    Hepatic function panel    Pulmonary function test    CANCELED: Hepatic function panel    CANCELED: B Nat Peptide    3. Chronic respiratory failure with hypoxia (HCC)  J96.11 B Nat Peptide    Hepatic function panel    Hepatic function panel    Pulmonary function test    CANCELED: Hepatic function panel    CANCELED: B Nat Peptide    4. High risk medication use  Z79.899 B Nat Peptide    Hepatic function panel    Hepatic function panel     Pulmonary function test    CANCELED: Hepatic function panel    CANCELED: B Nat Peptide    5. Encounter for therapeutic drug monitoring  Z51.81 B Nat Peptide    Hepatic function panel    Hepatic function panel    Pulmonary function test    CANCELED: Hepatic function panel    CANCELED: B Nat Peptide         Plan:     Patient Instructions     ICD-10-CM   1. IPF (idiopathic pulmonary fibrosis) (HCC)  J84.112 Hepatic function panel    B Nat Peptide    2. WHO group 3 pulmonary arterial hypertension (HCC)  I27.23 Hepatic function panel    B Nat Peptide    3. Chronic respiratory failure with hypoxia (HCC)  J96.11 Hepatic function panel    B Nat Peptide    4. High risk medication use  Z79.899 Hepatic function panel    B Nat Peptide    5. Encounter for therapeutic drug monitoring  Z51.81 Hepatic function panel    B Nat Peptide      Chronic hypoxemic respiratory failure  -This is because of a combination of pulmonary fibrosis IPF and also pulmonary hypertension diagnosed January 2025 -Finger pulse ox is inaccurate because forehead on room air at rest was 92%. -CT scan of the sinus was normal  Plan - Continue oxygen to keep pulse ox goal greater than 88%  -- Use 3-5 L of oxygen  -At some point we can consider pulmonary rehabilitation  ILD/IPF -IPF diagnosis given December 2024 High risk prescription Encounter for therapeutic monitoring  -Glad you are tolerating low-dose pirfenidone protocol well since January 2025   PLAN -Continue Esbriet 2 pills 3 times daily with food and please to apply sunscreen when you go out -Check liver function test today and then monthly for the next  3 months [latter can be done at ]   WHO group 3 pulmonary hypertension =-diagnosed January 2025  Plan  - Start inhaled Tyvaso per protocol [anytime this week]   22% PAC burden, 1710 runs of likely atrial tachycardia with variable AV block and 1 run of NSVT lasting 4 beats - Jan  2025 noticed by Dr Jenene Slicker  -Noted cardiac stress test was normal - Noted reluctance to strt cardizem as recommended and desiring a 2nd opinion   Plan -Hold off cardizem for now - Call Cardiology office and request transfer of care to Dr Diona Browner -I have also reached out to him  Followup -4 weeks video visit Dr. Marchelle Gearing nurse practitioner -8 weeks spirometry and DLCO in 30-minute visit with Dr. Sherrin Daisy Return for A video visit in 4 weeks with nurse practitioner in face-to-face in 8 weeks 30-minute Jesselee Poth.   ( Level 05 visit E&M 2024: Estb >= 40 min   in  visit type: on-site physical face to visit  in total care time and counseling or/and coordination of care by this undersigned MD - Dr Christian Woodard. This includes one or more of the following on this same day 01/15/2024: pre-charting, chart review, note writing, documentation discussion of test results, diagnostic or treatment recommendations, prognosis, risks and benefits of management options, instructions, education, compliance or risk-factor reduction. It excludes time spent by the CMA or office staff in the care of the patient. Actual time 40 min)    SIGNATURE    Dr. Kalman Woodard, M.D., F.C.C.P,  Pulmonary and Critical Care Medicine Staff Physician, [facility] Director - Interstitial Lung Disease  Program  Pulmonary Fibrosis [facility] at [facility] [place], [place], [address]  Pager: [phone], If no answer or between  15:00h - 7:00h: call [phone] Telephone: [phone]  11:46 AM 01/15/2024

## 2024-02-06 ENCOUNTER — Other Ambulatory Visit (HOSPITAL_COMMUNITY): Payer: Self-pay

## 2024-02-08 ENCOUNTER — Other Ambulatory Visit: Payer: Self-pay

## 2024-02-08 ENCOUNTER — Telehealth: Payer: Self-pay | Admitting: Primary Care

## 2024-02-08 ENCOUNTER — Other Ambulatory Visit (HOSPITAL_COMMUNITY): Payer: Self-pay

## 2024-02-08 ENCOUNTER — Telehealth: Payer: Medicare HMO | Admitting: Primary Care

## 2024-02-08 DIAGNOSIS — I2723 Pulmonary hypertension due to lung diseases and hypoxia: Secondary | ICD-10-CM | POA: Diagnosis not present

## 2024-02-08 DIAGNOSIS — Z87891 Personal history of nicotine dependence: Secondary | ICD-10-CM | POA: Diagnosis not present

## 2024-02-08 DIAGNOSIS — J84112 Idiopathic pulmonary fibrosis: Secondary | ICD-10-CM

## 2024-02-08 DIAGNOSIS — J849 Interstitial pulmonary disease, unspecified: Secondary | ICD-10-CM

## 2024-02-08 DIAGNOSIS — Z9981 Dependence on supplemental oxygen: Secondary | ICD-10-CM | POA: Diagnosis not present

## 2024-02-08 NOTE — Progress Notes (Signed)
 Specialty Pharmacy Refill Coordination Note  Christian Woodard is a 78 y.o. male contacted today regarding refills of specialty medication(s) Pirfenidone   Patient requested Delivery   Delivery date: 02/12/24   Verified address: [address]   [place]  [address]   Medication will be filled on 02/11/24.

## 2024-02-08 NOTE — Progress Notes (Signed)
 Virtual Visit via Video Note  I connected with Christian Woodard on 02/08/24 at  1:30 PM EDT by a video enabled telemedicine application and verified that I am speaking with the correct person using two identifiers.  Location: Patient: Home Provider: Office    I discussed the limitations of evaluation and management by telemedicine and the availability of in person appointments. The patient expressed understanding and agreed to proceed.  History of Present Illness: 78 year old male, former. PMH chronic respiratory failure, pulmonary fibrosis, GERD, type 2 diabetes, lung nodule, UACS, iron deficiency anemia.    OV 01/15/2024  Subjective:  Patient ID: Christian Woodard, male , DOB: [birth year] , age 17 y.o. , MRN: [id number] , ADDRESS: [address] [place] [address] PCP Carylon Perches, MD Patient Care Team: Carylon Perches, MD as PCP - General (Internal Medicine) Marjo Bicker, MD as PCP - Cardiology (Cardiology) Corbin Ade, MD as Consulting Physician (Gastroenterology) Nyoka Cowden, MD as Consulting Physician (Pulmonary Disease)  This Provider for this visit: Treatment Team:  Attending Provider: Kalman Shan, MD   #IPF - diagnosis given Dec 2024 and reco pirfenidone Rx snce Jan 2025 [low-dose protocol]  #Esbriet/Pirfenidone requires intensive drug monitoring due to high concerns for Adverse effects of , including  Drug Induced Liver Injury, significant GI side effects that include but not limited to Diarrhea, Nausea, Vomiting,  and other system side effects that include Fatigue, headaches, weight loss and other side effects such as skin rash. These will be monitored with  blood work such as LFT initially once a month for 6 months and then quarterly  #WHO-3 PAHx diagnosed Dec 17, 2023  - Normal filling pressures  - Moderate Pulmonary Hypertension - PA mean 41, PCWP 6, PRVR 8.80  - Lowish CI 2.15   - started TYVASO - date 01/17/24 PENDING  01/15/2024 -   Chief  Complaint  Patient presents with   Follow-up    Pt states has a few questions about meds.,      HPI Christian Woodard 78 y.o. -presents for follow-up with his wife and with his daughter.  Daughter is the main historian.  He attest that he is stable.  He is using 3 L oxygen at rest.  When I turned off room air he was 75% with a finger probe but with the following problems 92% room air at rest.  He is now taking pirfenidone.  He is on a low-dose protocol which is to 3 times daily.  He just got to it a few weeks ago.  He is tolerating it fine no side effects.  From his respiratory standpoint is not deteriorated.  He did have right heart catheterization.  He does have pulmonary hypertension.  Results are above.  He has been started on Tyvaso for group 3 pulmonary hypertension but this actual start is pending orientation.  He has a drug at home.  Is going to be done in the next few days.  If this fails they know they can enter into a study for group 3 pulmonary hypertension sponsored by [employer] note they are wondering if they should continue seeing cardiology.  He had a normal cardiac stress test.  He has completed his right heart cath.  However noticed that because of his tachycardia he did have a rhythm check and he is possible atrial tachycardia Cardizem was recommended in January 2025 by Dr. Mikal Plane but he is reluctant to do it especially in the setting  of lung disease.  Therefore he wants a second opinion.  I recommended Dr. Simona Huh and have reached out to him.  External records reviewed.  SYMPTOM SCALE - ILD 11/07/2023 - IPF dx 01/15/2024   Current weight    O2 use ra 3L at res but is 92% RA at rest with the forehead probe 75% with a finger probe  Shortness of Breath 0 -> 5 scale with 5 being worst (score 6 If unable to do)   At rest 2 2  Simple tasks - showers, clothes change, eating, shaving 5 5  Household (dishes, doing bed, laundry) 3 3  Shopping 4 5  Walking level  at own pace 3 3  Walking up Stairs 4 5  Total (30-36) Dyspnea Score 21 23  How bad is your cough? 1 1  How bad is your fatigue 1 2  How bad is nausea 00 0  How bad is vomiting?  0 0  How bad is diarrhea? 00 0  How bad is anxiety? 0 0  How bad is depression 0 0  Any chronic pain - if so where and how bad x 0    Chronic hypoxemic respiratory failure  -This is because of a combination of pulmonary fibrosis IPF and also pulmonary hypertension diagnosed January 2025 -Finger pulse ox is inaccurate because forehead on room air at rest was 92%. -CT scan of the sinus was normal  Plan - Continue oxygen to keep pulse ox goal greater than 88%  -- Use 3-5 L of oxygen  -At some point we can consider pulmonary rehabilitation  ILD/IPF -IPF diagnosis given December 2024 High risk prescription Encounter for therapeutic monitoring  -Glad you are tolerating low-dose pirfenidone protocol well since January 2025   PLAN -Continue Esbriet 2 pills 3 times daily with food and please to apply sunscreen when you go out -Check liver function test today and then monthly for the next 3 months [latter can be done at [facility]]   WHO group 3 pulmonary hypertension =-diagnosed January 2025  Plan  - Start inhaled Tyvaso per protocol [anytime this week]   22% PAC burden, 1710 runs of likely atrial tachycardia with variable AV block and 1 run of NSVT lasting 4 beats - Jan 2025 noticed by Dr Jenene Slicker  -Noted cardiac stress test was normal - Noted reluctance to strt cardizem as recommended and desiring a 2nd opinion   Plan -Hold off cardizem for now - Call Cardiology office and request transfer of care to Dr Diona Browner -I have also reached out to him  Followup -4 weeks video visit Dr. Marchelle Gearing nurse practitioner -8 weeks spirometry and DLCO in 30-minute visit with Dr. Marchelle Gearing      02/08/2024- interim hx / Video visit  Discussed the use of AI scribe software for clinical note transcription  with the patient, who gave verbal consent to proceed.  History of Present Illness   Christian Woodard is a 78 year old male with pulmonary hypertension who presents for a follow-up on Tyvaso treatment. He was referred by Dr. Marchelle Gearing for management of pulmonary hypertension.  He is currently on Tyvaso, an inhaled medication, at a dose of 32 micrograms, two puffs four times a day. He initially started at 16 micrograms and increased to 32 micrograms. A 48 microgram cartridge is available, but he has not yet increased to this dose. He tolerates the medication well without significant side effects such as cough, headache, or throat irritation.  He experiences shortness of breath, particularly  when walking for any length of time, which he attributes to nasal congestion affecting his oxygen intake. He uses Nasacort and a gel for nasal congestion and has humidification on his oxygen. He is on 4 liters of oxygen and reports his oxygen saturation fluctuating, with recent readings showing 94%.  He reports stable weight, with a recent weight of 164-166 pounds. No nausea, vomiting, or diarrhea from his medications. His recent blood pressure was 110/71.  He has not yet established care with a new cardiologist due to being busy with medication management and payments.       Observations/Objective:  Appears well, wearing oxygen. Able to speak in mostly full sentences without overt shortness of breath/wheezing or cough   Vital signs BP 110/71 O2 94% 4l  Assessment and Plan:  1. IPF (idiopathic pulmonary fibrosis) (HCC) (Primary) - Hepatic function panel; Future - Pro b natriuretic peptide; Future  2. WHO group 3 pulmonary arterial hypertension (HCC) - Hepatic function panel; Future - Pro b natriuretic peptide; Future      Pulmonary arterial hypertension On Tyvaso 32 mcg, tolerating medication well. Oxygen saturation stabilized at 94% on 4L oxygen. Consultation with Dr. Marchelle Gearing planned before  dose increase. - Consult Dr. Marchelle Gearing regarding Tyvaso dose increase. - Continue current Tyvaso dose. - Continue oxygen therapy.  Idiopathic pulmonary fibrosis/ Dx Dec 2024 Remains on ESBRIET 267 mg, no adverse effects. Tolerating medications. Normal liver function tests.  - Continue pirfenidone therapy. - Check liver function test monthly for the next 3 months [latter can be done at [facility]]   Follow Up Instructions: Follow-up with Dr. Marchelle Gearing and establish care with Dr. Diona Browner. Blood work due next week. - Schedule follow-up with Dr. Marchelle Gearing in mid-April. - Establish cardiology care with Dr. Diona Browner. - Order blood work for next week.     I discussed the assessment and treatment plan with the patient. The patient was provided an opportunity to ask questions and all were answered. The patient agreed with the plan and demonstrated an understanding of the instructions.   The patient was advised to call back or seek an in-person evaluation if the symptoms worsen or if the condition fails to improve as anticipated.  I provided 24 minutes of non-face-to-face time during this encounter.   Glenford Bayley, NP

## 2024-02-08 NOTE — Telephone Encounter (Signed)
 Dr. Marchelle Gearing-  Patient is tolerating low dose Esbriet and Tyvaso. Currently on dose cartridge, two puffs qid and reports no side effects of cough, headache or throat irritation. Are you ok wth him going up to (what's your target dose for him). Blood pressure was 110/70, O2 94% 4L. He is getting LFTs and BNP next week _____________________________________  Lebron Quam-  Patient needs Spirometry with DLCO and follow-up/30 min ILD slot with MR mid-late April

## 2024-02-08 NOTE — Telephone Encounter (Signed)
 PFT ordered. Routing to front desk to schedule full pft 1 hour prior to visit with Dr Marchelle Gearing in April

## 2024-02-11 ENCOUNTER — Other Ambulatory Visit (HOSPITAL_COMMUNITY)
Admission: RE | Admit: 2024-02-11 | Discharge: 2024-02-11 | Disposition: A | Discharge status: Home / Self Care | Source: Ambulatory Visit | Attending: Internal Medicine | Admitting: Internal Medicine

## 2024-02-11 ENCOUNTER — Other Ambulatory Visit: Payer: Self-pay

## 2024-02-11 DIAGNOSIS — J9611 Chronic respiratory failure with hypoxia: Secondary | ICD-10-CM | POA: Insufficient documentation

## 2024-02-11 DIAGNOSIS — I2723 Pulmonary hypertension due to lung diseases and hypoxia: Secondary | ICD-10-CM | POA: Diagnosis not present

## 2024-02-11 DIAGNOSIS — J84112 Idiopathic pulmonary fibrosis: Secondary | ICD-10-CM | POA: Insufficient documentation

## 2024-02-11 DIAGNOSIS — H16223 Keratoconjunctivitis sicca, not specified as Sjogren's, bilateral: Secondary | ICD-10-CM | POA: Diagnosis not present

## 2024-02-11 DIAGNOSIS — Z5181 Encounter for therapeutic drug level monitoring: Secondary | ICD-10-CM | POA: Insufficient documentation

## 2024-02-11 DIAGNOSIS — Z79899 Other long term (current) drug therapy: Secondary | ICD-10-CM | POA: Diagnosis not present

## 2024-02-11 DIAGNOSIS — H40023 Open angle with borderline findings, high risk, bilateral: Secondary | ICD-10-CM | POA: Diagnosis not present

## 2024-02-11 DIAGNOSIS — H18623 Keratoconus, unstable, bilateral: Secondary | ICD-10-CM | POA: Diagnosis not present

## 2024-02-11 DIAGNOSIS — E119 Type 2 diabetes mellitus without complications: Secondary | ICD-10-CM | POA: Diagnosis not present

## 2024-02-11 DIAGNOSIS — Z947 Corneal transplant status: Secondary | ICD-10-CM | POA: Diagnosis not present

## 2024-02-11 LAB — HEPATIC FUNCTION PANEL
ALT: 24 U/L (ref 0–44)
AST: 30 U/L (ref 15–41)
Albumin: 3.9 g/dL (ref 3.5–5.0)
Alkaline Phosphatase: 68 U/L (ref 38–126)
Bilirubin, Direct: 0.2 mg/dL (ref 0.0–0.2)
Indirect Bilirubin: 0.7 mg/dL (ref 0.3–0.9)
Total Bilirubin: 0.9 mg/dL (ref 0.0–1.2)
Total Protein: 7.3 g/dL (ref 6.5–8.1)

## 2024-02-11 NOTE — Telephone Encounter (Signed)
 Beth  There is usually a protocol that the treprostinil reps help the patient.  He should try to get to the max dose of 64 mcg.  I am assuming he is on DPI

## 2024-02-11 NOTE — Telephone Encounter (Signed)
 Ok, ill tell my nurse to have the patient reach out to Tyvaso rep.   Ashlyn= please let patient know we would like him to increase to next cartridge when he is due. Goal is to get to , if having any headaches, cough, throat irritation or dizziness please advise patient to notify office

## 2024-02-11 NOTE — Patient Instructions (Signed)
 -  PULMONARY ARTERIAL HYPERTENSION: Pulmonary arterial hypertension is high blood pressure in the arteries of your lungs. You are currently on Tyvaso at 32 micrograms, which you tolerate well. We will consult with Dr. Marchelle Gearing before considering any dose increase. Continue your current dose and oxygen therapy, and ensure you have enough Tyvaso cartridges.  -IDIOPATHIC PULMONARY FIBROSIS: Idiopathic pulmonary fibrosis is a condition where the lung tissue becomes scarred and stiff. You are on pirfenidone 267 mg and have had no adverse effects. Your liver function tests are normal, and your BNP level is slightly elevated but not concerning. Continue your current medication, and we will monitor your liver function monthly and repeat the BNP level in the future.  INSTRUCTIONS:  Please schedule a follow-up appointment with Dr. Marchelle Gearing in mid-April and establish cardiology care with Dr. Diona Browner. Additionally, ensure to get your blood work done next week.

## 2024-02-26 DIAGNOSIS — I5032 Chronic diastolic (congestive) heart failure: Secondary | ICD-10-CM | POA: Diagnosis not present

## 2024-02-26 DIAGNOSIS — E1129 Type 2 diabetes mellitus with other diabetic kidney complication: Secondary | ICD-10-CM | POA: Diagnosis not present

## 2024-02-26 DIAGNOSIS — I700 Atherosclerosis of aorta: Secondary | ICD-10-CM | POA: Diagnosis not present

## 2024-02-26 DIAGNOSIS — E785 Hyperlipidemia, unspecified: Secondary | ICD-10-CM | POA: Diagnosis not present

## 2024-02-26 DIAGNOSIS — J9611 Chronic respiratory failure with hypoxia: Secondary | ICD-10-CM | POA: Diagnosis not present

## 2024-03-03 DIAGNOSIS — N183 Chronic kidney disease, stage 3 unspecified: Secondary | ICD-10-CM | POA: Diagnosis not present

## 2024-03-03 DIAGNOSIS — E1122 Type 2 diabetes mellitus with diabetic chronic kidney disease: Secondary | ICD-10-CM | POA: Diagnosis not present

## 2024-03-03 DIAGNOSIS — J84178 Other interstitial pulmonary diseases with fibrosis in diseases classified elsewhere: Secondary | ICD-10-CM | POA: Diagnosis not present

## 2024-03-03 DIAGNOSIS — J9611 Chronic respiratory failure with hypoxia: Secondary | ICD-10-CM | POA: Diagnosis not present

## 2024-03-18 ENCOUNTER — Other Ambulatory Visit: Payer: Self-pay

## 2024-03-18 ENCOUNTER — Other Ambulatory Visit: Payer: Self-pay | Admitting: Pharmacy Technician

## 2024-03-18 NOTE — Progress Notes (Addendum)
 Specialty Pharmacy Refill Coordination Note  Christian Woodard is a 78 y.o. male contacted today regarding refills of specialty medication(s) Pirfenidone   Spoke with Wife  Patient requested Delivery   Delivery date: 03/26/24   Verified address: [address]  [place]   Medication will be filled on 03/25/24.

## 2024-03-25 ENCOUNTER — Other Ambulatory Visit: Payer: Self-pay

## 2024-04-06 ENCOUNTER — Emergency Department (HOSPITAL_COMMUNITY)

## 2024-04-06 ENCOUNTER — Other Ambulatory Visit: Payer: Self-pay

## 2024-04-06 ENCOUNTER — Inpatient Hospital Stay (HOSPITAL_COMMUNITY)
Admission: EM | Admit: 2024-04-06 | Discharge: 2024-04-10 | DRG: 196 | Disposition: A | Discharge status: Skilled Nursing Facility | Attending: Family Medicine | Admitting: Family Medicine

## 2024-04-06 ENCOUNTER — Encounter (HOSPITAL_COMMUNITY): Payer: Self-pay | Admitting: Emergency Medicine

## 2024-04-06 DIAGNOSIS — Z9981 Dependence on supplemental oxygen: Secondary | ICD-10-CM

## 2024-04-06 DIAGNOSIS — R55 Syncope and collapse: Secondary | ICD-10-CM | POA: Diagnosis present

## 2024-04-06 DIAGNOSIS — W1811XA Fall from or off toilet without subsequent striking against object, initial encounter: Secondary | ICD-10-CM | POA: Diagnosis not present

## 2024-04-06 DIAGNOSIS — S0003XA Contusion of scalp, initial encounter: Secondary | ICD-10-CM | POA: Diagnosis not present

## 2024-04-06 DIAGNOSIS — E119 Type 2 diabetes mellitus without complications: Secondary | ICD-10-CM

## 2024-04-06 DIAGNOSIS — I5033 Acute on chronic diastolic (congestive) heart failure: Secondary | ICD-10-CM | POA: Diagnosis present

## 2024-04-06 DIAGNOSIS — Z79899 Other long term (current) drug therapy: Secondary | ICD-10-CM | POA: Diagnosis not present

## 2024-04-06 DIAGNOSIS — Z8249 Family history of ischemic heart disease and other diseases of the circulatory system: Secondary | ICD-10-CM

## 2024-04-06 DIAGNOSIS — E1165 Type 2 diabetes mellitus with hyperglycemia: Secondary | ICD-10-CM | POA: Diagnosis present

## 2024-04-06 DIAGNOSIS — Z9049 Acquired absence of other specified parts of digestive tract: Secondary | ICD-10-CM

## 2024-04-06 DIAGNOSIS — R54 Age-related physical debility: Secondary | ICD-10-CM | POA: Diagnosis present

## 2024-04-06 DIAGNOSIS — I272 Pulmonary hypertension, unspecified: Secondary | ICD-10-CM | POA: Diagnosis not present

## 2024-04-06 DIAGNOSIS — R0902 Hypoxemia: Secondary | ICD-10-CM | POA: Diagnosis not present

## 2024-04-06 DIAGNOSIS — S069X1A Unspecified intracranial injury with loss of consciousness of 30 minutes or less, initial encounter: Secondary | ICD-10-CM | POA: Diagnosis not present

## 2024-04-06 DIAGNOSIS — R0609 Other forms of dyspnea: Secondary | ICD-10-CM | POA: Diagnosis not present

## 2024-04-06 DIAGNOSIS — Z7951 Long term (current) use of inhaled steroids: Secondary | ICD-10-CM

## 2024-04-06 DIAGNOSIS — R918 Other nonspecific abnormal finding of lung field: Secondary | ICD-10-CM | POA: Diagnosis not present

## 2024-04-06 DIAGNOSIS — R739 Hyperglycemia, unspecified: Secondary | ICD-10-CM | POA: Diagnosis not present

## 2024-04-06 DIAGNOSIS — Z7984 Long term (current) use of oral hypoglycemic drugs: Secondary | ICD-10-CM

## 2024-04-06 DIAGNOSIS — N1831 Chronic kidney disease, stage 3a: Secondary | ICD-10-CM | POA: Diagnosis present

## 2024-04-06 DIAGNOSIS — Z825 Family history of asthma and other chronic lower respiratory diseases: Secondary | ICD-10-CM

## 2024-04-06 DIAGNOSIS — Z947 Corneal transplant status: Secondary | ICD-10-CM

## 2024-04-06 DIAGNOSIS — K219 Gastro-esophageal reflux disease without esophagitis: Secondary | ICD-10-CM | POA: Diagnosis present

## 2024-04-06 DIAGNOSIS — R000 Tachycardia, unspecified: Secondary | ICD-10-CM | POA: Diagnosis not present

## 2024-04-06 DIAGNOSIS — Y92231 Patient bathroom in hospital as the place of occurrence of the external cause: Secondary | ICD-10-CM | POA: Diagnosis not present

## 2024-04-06 DIAGNOSIS — I499 Cardiac arrhythmia, unspecified: Secondary | ICD-10-CM | POA: Diagnosis not present

## 2024-04-06 DIAGNOSIS — E7800 Pure hypercholesterolemia, unspecified: Secondary | ICD-10-CM | POA: Diagnosis present

## 2024-04-06 DIAGNOSIS — K2270 Barrett's esophagus without dysplasia: Secondary | ICD-10-CM | POA: Diagnosis present

## 2024-04-06 DIAGNOSIS — Z043 Encounter for examination and observation following other accident: Secondary | ICD-10-CM | POA: Diagnosis not present

## 2024-04-06 DIAGNOSIS — K7689 Other specified diseases of liver: Secondary | ICD-10-CM | POA: Diagnosis not present

## 2024-04-06 DIAGNOSIS — S0011XA Contusion of right eyelid and periocular area, initial encounter: Secondary | ICD-10-CM | POA: Diagnosis not present

## 2024-04-06 DIAGNOSIS — R591 Generalized enlarged lymph nodes: Secondary | ICD-10-CM | POA: Diagnosis not present

## 2024-04-06 DIAGNOSIS — Z794 Long term (current) use of insulin: Secondary | ICD-10-CM

## 2024-04-06 DIAGNOSIS — J9621 Acute and chronic respiratory failure with hypoxia: Secondary | ICD-10-CM | POA: Diagnosis not present

## 2024-04-06 DIAGNOSIS — Z801 Family history of malignant neoplasm of trachea, bronchus and lung: Secondary | ICD-10-CM

## 2024-04-06 DIAGNOSIS — I517 Cardiomegaly: Secondary | ICD-10-CM | POA: Diagnosis not present

## 2024-04-06 DIAGNOSIS — D649 Anemia, unspecified: Secondary | ICD-10-CM | POA: Diagnosis present

## 2024-04-06 DIAGNOSIS — I2510 Atherosclerotic heart disease of native coronary artery without angina pectoris: Secondary | ICD-10-CM | POA: Diagnosis present

## 2024-04-06 DIAGNOSIS — J8410 Pulmonary fibrosis, unspecified: Principal | ICD-10-CM | POA: Diagnosis present

## 2024-04-06 DIAGNOSIS — I130 Hypertensive heart and chronic kidney disease with heart failure and stage 1 through stage 4 chronic kidney disease, or unspecified chronic kidney disease: Secondary | ICD-10-CM | POA: Diagnosis not present

## 2024-04-06 DIAGNOSIS — K22710 Barrett's esophagus with low grade dysplasia: Secondary | ICD-10-CM | POA: Diagnosis not present

## 2024-04-06 DIAGNOSIS — S0083XA Contusion of other part of head, initial encounter: Secondary | ICD-10-CM

## 2024-04-06 DIAGNOSIS — I700 Atherosclerosis of aorta: Secondary | ICD-10-CM | POA: Diagnosis not present

## 2024-04-06 DIAGNOSIS — Y92002 Bathroom of unspecified non-institutional (private) residence single-family (private) house as the place of occurrence of the external cause: Secondary | ICD-10-CM | POA: Diagnosis not present

## 2024-04-06 DIAGNOSIS — Z7982 Long term (current) use of aspirin: Secondary | ICD-10-CM | POA: Diagnosis not present

## 2024-04-06 DIAGNOSIS — E1122 Type 2 diabetes mellitus with diabetic chronic kidney disease: Secondary | ICD-10-CM | POA: Diagnosis present

## 2024-04-06 DIAGNOSIS — R220 Localized swelling, mass and lump, head: Secondary | ICD-10-CM | POA: Diagnosis not present

## 2024-04-06 DIAGNOSIS — J439 Emphysema, unspecified: Secondary | ICD-10-CM | POA: Diagnosis not present

## 2024-04-06 DIAGNOSIS — Z87891 Personal history of nicotine dependence: Secondary | ICD-10-CM

## 2024-04-06 DIAGNOSIS — I2489 Other forms of acute ischemic heart disease: Secondary | ICD-10-CM | POA: Diagnosis present

## 2024-04-06 DIAGNOSIS — S0990XA Unspecified injury of head, initial encounter: Secondary | ICD-10-CM | POA: Diagnosis not present

## 2024-04-06 DIAGNOSIS — I491 Atrial premature depolarization: Secondary | ICD-10-CM | POA: Diagnosis not present

## 2024-04-06 LAB — CBC WITH DIFFERENTIAL/PLATELET
Abs Immature Granulocytes: 0.1 10*3/uL — ABNORMAL HIGH (ref 0.00–0.07)
Basophils Absolute: 0.1 10*3/uL (ref 0.0–0.1)
Basophils Relative: 1 %
Eosinophils Absolute: 0.1 10*3/uL (ref 0.0–0.5)
Eosinophils Relative: 1 %
HCT: 42.3 % (ref 39.0–52.0)
Hemoglobin: 13.9 g/dL (ref 13.0–17.0)
Immature Granulocytes: 1 %
Lymphocytes Relative: 7 %
Lymphs Abs: 0.9 10*3/uL (ref 0.7–4.0)
MCH: 30.3 pg (ref 26.0–34.0)
MCHC: 32.9 g/dL (ref 30.0–36.0)
MCV: 92.2 fL (ref 80.0–100.0)
Monocytes Absolute: 0.9 10*3/uL (ref 0.1–1.0)
Monocytes Relative: 8 %
Neutro Abs: 10.2 10*3/uL — ABNORMAL HIGH (ref 1.7–7.7)
Neutrophils Relative %: 82 %
Platelets: 309 10*3/uL (ref 150–400)
RBC: 4.59 MIL/uL (ref 4.22–5.81)
RDW: 15.4 % (ref 11.5–15.5)
WBC: 12.2 10*3/uL — ABNORMAL HIGH (ref 4.0–10.5)
nRBC: 0 % (ref 0.0–0.2)

## 2024-04-06 LAB — COMPREHENSIVE METABOLIC PANEL WITH GFR
ALT: 23 U/L (ref 0–44)
AST: 36 U/L (ref 15–41)
Albumin: 3.7 g/dL (ref 3.5–5.0)
Alkaline Phosphatase: 80 U/L (ref 38–126)
Anion gap: 9 (ref 5–15)
BUN: 22 mg/dL (ref 8–23)
CO2: 21 mmol/L — ABNORMAL LOW (ref 22–32)
Calcium: 8.5 mg/dL — ABNORMAL LOW (ref 8.9–10.3)
Chloride: 103 mmol/L (ref 98–111)
Creatinine, Ser: 1.44 mg/dL — ABNORMAL HIGH (ref 0.61–1.24)
GFR, Estimated: 50 mL/min — ABNORMAL LOW (ref >60)
Glucose, Bld: 300 mg/dL — ABNORMAL HIGH (ref 70–99)
Potassium: 4.3 mmol/L (ref 3.5–5.1)
Sodium: 133 mmol/L — ABNORMAL LOW (ref 135–145)
Total Bilirubin: 1 mg/dL (ref 0.0–1.2)
Total Protein: 6.7 g/dL (ref 6.5–8.1)

## 2024-04-06 LAB — TROPONIN I (HIGH SENSITIVITY)
Troponin I (High Sensitivity): 31 ng/L — ABNORMAL HIGH (ref <18)
Troponin I (High Sensitivity): 45 ng/L — ABNORMAL HIGH (ref <18)

## 2024-04-06 LAB — BLOOD GAS, VENOUS
Acid-Base Excess: 2.4 mmol/L — ABNORMAL HIGH (ref 0.0–2.0)
Bicarbonate: 28.8 mmol/L — ABNORMAL HIGH (ref 20.0–28.0)
Drawn by: 7049
O2 Saturation: 59.1 %
Patient temperature: 36.4
pCO2, Ven: 50 mmHg (ref 44–60)
pH, Ven: 7.37 (ref 7.25–7.43)
pO2, Ven: 37 mmHg (ref 32–45)

## 2024-04-06 LAB — BRAIN NATRIURETIC PEPTIDE: B Natriuretic Peptide: 416 pg/mL — ABNORMAL HIGH (ref 0.0–100.0)

## 2024-04-06 LAB — CBG MONITORING, ED
Glucose-Capillary: 256 mg/dL — ABNORMAL HIGH (ref 70–99)
Glucose-Capillary: 263 mg/dL — ABNORMAL HIGH (ref 70–99)

## 2024-04-06 LAB — MAGNESIUM: Magnesium: 2 mg/dL (ref 1.7–2.4)

## 2024-04-06 MED ORDER — SODIUM CHLORIDE 0.9 % IV SOLN
500.0000 mg | INTRAVENOUS | Status: AC
  Administered 2024-04-06 – 2024-04-08 (×3): 500 mg via INTRAVENOUS
  Filled 2024-04-06 (×3): qty 5

## 2024-04-06 MED ORDER — SODIUM CHLORIDE 0.9% FLUSH
3.0000 mL | Freq: Two times a day (BID) | INTRAVENOUS | Status: DC
  Administered 2024-04-07 – 2024-04-10 (×7): 3 mL via INTRAVENOUS

## 2024-04-06 MED ORDER — FUROSEMIDE 40 MG PO TABS
20.0000 mg | ORAL_TABLET | Freq: Every day | ORAL | Status: DC
Start: 2024-04-07 — End: 2024-04-06

## 2024-04-06 MED ORDER — SENNOSIDES-DOCUSATE SODIUM 8.6-50 MG PO TABS: 1.0000 | ORAL_TABLET | Freq: Every evening | ORAL | Status: DC | PRN

## 2024-04-06 MED ORDER — ONDANSETRON HCL 4 MG/2ML IJ SOLN
4.0000 mg | Freq: Once | INTRAMUSCULAR | Status: AC
Start: 2024-04-06 — End: 2024-04-06
  Administered 2024-04-06: 4 mg via INTRAVENOUS
  Filled 2024-04-06: qty 2

## 2024-04-06 MED ORDER — INSULIN GLARGINE-YFGN 100 UNIT/ML ~~LOC~~ SOLN
20.0000 [IU] | Freq: Every day | SUBCUTANEOUS | Status: DC
  Administered 2024-04-07: 20 [IU] via SUBCUTANEOUS
  Filled 2024-04-06 (×2): qty 0.2

## 2024-04-06 MED ORDER — ACETAMINOPHEN 500 MG PO TABS
1000.0000 mg | ORAL_TABLET | Freq: Once | ORAL | Status: AC
Start: 2024-04-06 — End: 2024-04-06
  Administered 2024-04-06: 1000 mg via ORAL
  Filled 2024-04-06: qty 2

## 2024-04-06 MED ORDER — TREPROSTINIL 32 MCG IN POWD: 2.0000 | Freq: Three times a day (TID) | RESPIRATORY_TRACT | Status: DC

## 2024-04-06 MED ORDER — ASPIRIN 81 MG PO TBEC
81.0000 mg | DELAYED_RELEASE_TABLET | Freq: Every evening | ORAL | Status: DC
  Administered 2024-04-07 – 2024-04-09 (×3): 81 mg via ORAL
  Filled 2024-04-06 (×3): qty 1

## 2024-04-06 MED ORDER — FUROSEMIDE 10 MG/ML IJ SOLN
40.0000 mg | Freq: Two times a day (BID) | INTRAMUSCULAR | Status: DC
  Administered 2024-04-06 – 2024-04-10 (×8): 40 mg via INTRAVENOUS
  Filled 2024-04-06 (×7): qty 4

## 2024-04-06 MED ORDER — ENOXAPARIN SODIUM 40 MG/0.4ML IJ SOSY
40.0000 mg | PREFILLED_SYRINGE | INTRAMUSCULAR | Status: DC
  Administered 2024-04-07 – 2024-04-10 (×4): 40 mg via SUBCUTANEOUS
  Filled 2024-04-06 (×4): qty 0.4

## 2024-04-06 MED ORDER — SODIUM CHLORIDE 0.9 % IV SOLN
2.0000 g | INTRAVENOUS | Status: DC
  Administered 2024-04-07: 2 g via INTRAVENOUS
  Filled 2024-04-06: qty 20

## 2024-04-06 MED ORDER — PANTOPRAZOLE SODIUM 40 MG PO TBEC
40.0000 mg | DELAYED_RELEASE_TABLET | Freq: Every day | ORAL | Status: DC
  Administered 2024-04-07 – 2024-04-10 (×4): 40 mg via ORAL
  Filled 2024-04-06 (×4): qty 1

## 2024-04-06 MED ORDER — SODIUM CHLORIDE 0.9 % IV SOLN
1.0000 g | Freq: Once | INTRAVENOUS | Status: AC
  Administered 2024-04-06: 1 g via INTRAVENOUS
  Filled 2024-04-06: qty 10

## 2024-04-06 MED ORDER — GABAPENTIN 300 MG PO CAPS
300.0000 mg | ORAL_CAPSULE | Freq: Two times a day (BID) | ORAL | Status: DC
  Administered 2024-04-06 – 2024-04-10 (×8): 300 mg via ORAL
  Filled 2024-04-06 (×8): qty 1

## 2024-04-06 MED ORDER — LOSARTAN POTASSIUM 25 MG PO TABS: 50.0000 mg | ORAL_TABLET | Freq: Every morning | ORAL | Status: DC

## 2024-04-06 MED ORDER — INSULIN ASPART 100 UNIT/ML IJ SOLN
0.0000 [IU] | Freq: Every day | INTRAMUSCULAR | Status: DC
  Administered 2024-04-06: 3 [IU] via SUBCUTANEOUS
  Administered 2024-04-07: 4 [IU] via SUBCUTANEOUS
  Filled 2024-04-06: qty 1

## 2024-04-06 MED ORDER — ATORVASTATIN CALCIUM 10 MG PO TABS
10.0000 mg | ORAL_TABLET | Freq: Every day | ORAL | Status: DC
  Administered 2024-04-06 – 2024-04-09 (×4): 10 mg via ORAL
  Filled 2024-04-06 (×4): qty 1

## 2024-04-06 MED ORDER — IOHEXOL 350 MG/ML SOLN
75.0000 mL | Freq: Once | INTRAVENOUS | Status: AC | PRN
  Administered 2024-04-06: 75 mL via INTRAVENOUS

## 2024-04-06 MED ORDER — IPRATROPIUM-ALBUTEROL 0.5-2.5 (3) MG/3ML IN SOLN: 3.0000 mL | RESPIRATORY_TRACT | Status: DC | PRN

## 2024-04-06 MED ORDER — FUROSEMIDE 10 MG/ML IJ SOLN
40.0000 mg | Freq: Once | INTRAMUSCULAR | Status: DC
  Filled 2024-04-06: qty 4

## 2024-04-06 MED ORDER — INSULIN ASPART 100 UNIT/ML IJ SOLN
3.0000 [IU] | Freq: Three times a day (TID) | INTRAMUSCULAR | Status: DC
  Administered 2024-04-07 (×2): 3 [IU] via SUBCUTANEOUS
  Filled 2024-04-06: qty 1

## 2024-04-06 MED ORDER — ACETAMINOPHEN 650 MG RE SUPP: 650.0000 mg | Freq: Four times a day (QID) | RECTAL | Status: DC | PRN

## 2024-04-06 MED ORDER — INSULIN ASPART 100 UNIT/ML IJ SOLN
0.0000 [IU] | Freq: Three times a day (TID) | INTRAMUSCULAR | Status: DC
  Administered 2024-04-07 (×2): 4 [IU] via SUBCUTANEOUS
  Filled 2024-04-06: qty 1

## 2024-04-06 MED ORDER — ACETAMINOPHEN 325 MG PO TABS
650.0000 mg | ORAL_TABLET | Freq: Four times a day (QID) | ORAL | Status: DC | PRN
  Administered 2024-04-08 – 2024-04-09 (×2): 650 mg via ORAL
  Filled 2024-04-06 (×2): qty 2

## 2024-04-06 MED ORDER — PIRFENIDONE 267 MG PO TABS
534.0000 mg | ORAL_TABLET | Freq: Three times a day (TID) | ORAL | Status: DC
  Administered 2024-04-07 – 2024-04-10 (×9): 534 mg via ORAL
  Filled 2024-04-06: qty 2

## 2024-04-06 NOTE — ED Triage Notes (Signed)
 Pt fell while trying to get to restroom and then fell again after using the restroom. Pt c/o nausea. Pt is on continuous ow at 4lpm.

## 2024-04-06 NOTE — ED Notes (Signed)
 ED Provider at bedside.

## 2024-04-06 NOTE — H&P (Signed)
 History and Physical    Christian Woodard EXB:[id number] DOB: [birth year] DOA: 04/06/2024  PCP: Artemisa Bile, MD   Patient coming from: Home   Chief Complaint: LOC with fall and head injury, increased SOB   HPI: Christian Woodard is a 78 y.o. male with medical history significant for pulmonary fibrosis, pulmonary hypertension, insulin -dependent diabetes mellitus, hypertension chronic diastolic CHF, and chronic hypoxic respiratory failure who presents with increased shortness of breath and syncope.  Patient reports roughly 2 weeks of increased dyspnea.  His chronic cough has not changed much and he has not noticed any leg swelling.  He has had some rhinorrhea but attributes this to recently increasing his supplemental oxygen.  He became lightheaded today while trying to make it to the bathroom, began to fall, but was assisted to the commode by his wife.  Immediately after sitting on the toilet, he suffered a brief loss of consciousness, fell forward, and struck his head on the floor.  He quickly regained consciousness, has a headache, but no new focal numbness or weakness.  ED Course: Upon arrival to the ED, patient is found to be afebrile and saturating mid 90s on 15 L/min of supplemental oxygen with tachypnea, transient tachycardia, and stable BP.  Labs are most notable for glucose 300, creatinine 1.44, WBC 12,200, troponin 45, and BNP 03/06/2015.  Review of Systems:  All other systems reviewed and apart from HPI, are negative.  Past Medical History:  Diagnosis Date   Arthritis    Barrett's esophagus    With low-grade dysplasia, following with [facility]   Diabetes mellitus    GERD (gastroesophageal reflux disease)    Hypercholesteremia     Past Surgical History:  Procedure Laterality Date   BACK SURGERY  2000   CHOLECYSTECTOMY     COLONOSCOPY  09/15/2011   Dr. Riley Cheadle: Suboptimal prep. Pancolonic diverticulosis. Tubular adenomas. Next colonoscopy 5 years.   COLONOSCOPY N/A 09/22/2016    Surgeon: Suzette Espy, MD; pancolonic diverticulosis, otherwise normal exam.  Recommended repeat in 5 years.   COLONOSCOPY WITH PROPOFOL  N/A 09/29/2021   Procedure: COLONOSCOPY WITH PROPOFOL ;  Surgeon: Suzette Espy, MD;  Location: [facility];  Service: Endoscopy;  Laterality: N/A;  9:00am   ESOPHAGOGASTRODUODENOSCOPY N/A 08/31/2017   Surgeon: Suzette Espy, MD;  Barrett's esophagus with low-grade dysplasia, small hiatal hernia with abnormal gastric mucosa biopsied (no H. pylori), normal examined duodenum.     ESOPHAGOGASTRODUODENOSCOPY  11/2018   [facility]; Barrett's esophagus, negative for dysplasia   ESOPHAGOGASTRODUODENOSCOPY  01/2020   [facility]; Barrett's esophagus, indefinite for focal low-grade dysplasia.   GIVENS CAPSULE STUDY N/A 10/26/2017   Surgeon: Suzette Espy, MD; localized clustered areas of white "blunted appearing" abnormal small bowel mucosa/villi of uncertain significance.  Follow-up CTE and small bowel enteroscopy at [facility] without significant findings.    GIVENS CAPSULE STUDY N/A 05/15/2023   Procedure: GIVENS CAPSULE STUDY;  Surgeon: Suzette Espy, MD;  Location: [facility];  Service: Endoscopy;  Laterality: N/A;  730am   HERNIA REPAIR     as a child   left cornea transplant Left    POLYPECTOMY  09/29/2021   Procedure: POLYPECTOMY;  Surgeon: Suzette Espy, MD;  Location: [facility];  Service: Endoscopy;;   Right cornea transplant     RIGHT HEART CATH N/A 12/17/2023   Procedure: RIGHT HEART CATH;  Surgeon: Darlis Eisenmenger, MD;  Location: [facility] INVASIVE CV LAB;  Service: Cardiovascular;  Laterality: N/A;   SMALL BOWEL ENTEROSCOPY  11/2017   [facility]; Barrett's esophagus focally positive for low-grade dysplasia, normal stomach, normal duodenum, normal jejunum.  Small bowel biopsies were benign.   TONSILLECTOMY      Social History:   reports that he quit smoking about 25 years ago. His smoking use included cigarettes. He started smoking about 61 years ago. He has a 72  pack-year smoking history. He has never used smokeless tobacco. He reports that he does not drink alcohol and does not use drugs.  No Known Allergies  Family History  Problem Relation Age of Onset   Heart disease Mother    Heart disease Father    Emphysema Father        smoked   Lung cancer Father        smoked   Colon cancer Neg Hx      Prior to Admission medications   Medication Sig Start Date End Date Taking? Authorizing Provider  acetaminophen  (TYLENOL ) 500 MG tablet Take 500 mg by mouth every 6 (six) hours as needed (headaches/pain.).    [provider]  aspirin  EC 81 MG tablet Take 81 mg by mouth every evening. Swallow whole.    [provider]  atorvastatin  (LIPITOR) 10 MG tablet Take 10 mg by mouth at bedtime.    [provider]  budesonide -formoterol  (SYMBICORT ) 160-4.5 MCG/ACT inhaler Inhale 2 puffs into the lungs in the morning and at bedtime. Patient not taking: Reported on 02/08/2024 05/19/23   [name], MD  chlorpheniramine (CHLOR-TRIMETON) 4 MG tablet Take 4 mg by mouth daily.    [provider]  cyanocobalamin  (VITAMIN B12) 1000 MCG tablet Take 1,000 mcg by mouth daily.    [provider]  famotidine  (PEPCID ) 10 MG tablet Take 10 mg by mouth every evening.    [provider]  Ferrous Gluconate  (IRON  27 PO) Take 1 tablet by mouth daily.    [provider]  furosemide  (LASIX ) 20 MG tablet Take 1 tablet (20 mg total) by mouth daily. 05/19/23 05/18/24  [name], MD  gabapentin  (NEURONTIN ) 300 MG capsule Take 1 capsule (300 mg total) by mouth 2 (two) times daily. 05/19/23   [name], MD  glipiZIDE  (GLUCOTROL  XL) 5 MG 24 hr tablet Take 5 mg by mouth daily with breakfast.    [provider]  Ipratropium-Albuterol  (COMBIVENT  RESPIMAT) 20-100 MCG/ACT AERS respimat Inhale 1 puff into the lungs every 6 (six) hours as needed for wheezing or shortness of breath. 05/19/23   [name], MD  LANTUS   SOLOSTAR 100 UNIT/ML Solostar Pen Inject 40 Units into the skin in the morning. 08/23/16   [provider]  losartan  (COZAAR ) 50 MG tablet Take 50 mg by mouth in the morning.    [provider]  metFORMIN  (GLUCOPHAGE ) 1000 MG tablet Take 1,000 mg by mouth 2 (two) times daily. 07/22/16   [provider]  NOVOLOG  FLEXPEN 100 UNIT/ML FlexPen Inject 6 Units into the skin 2 (two) times daily with a meal. 02/05/23   [provider]  pantoprazole  (PROTONIX ) 40 MG tablet TAKE 1 TABLET BY MOUTH ONE TIME DAILY 30-60 MINUTES BEFORE BREAKFAST 05/22/23   [name], PA-C  Pirfenidone  267 MG TABS Take 2 tablets (534 mg total) by mouth in the morning, at noon, and at bedtime. **low dose as maintenance** 01/11/24   [name], MD  potassium chloride  (MICRO-K ) 10 MEQ CR capsule Take 10 mEq by mouth daily. 10/24/23   [provider]  Treprostinil (TYVASO DPI INSTITUTIONAL KIT) 32 MCG  POWD Inhale 2 puffs into the lungs in the morning, at noon, in the evening, and at bedtime.    [provider]    Physical Exam: Vitals:   04/06/24 2258 04/06/24 2259 04/06/24 2300 04/06/24 2315  BP:    (!) 148/85  Pulse: 90 90 92 91  Resp: 20 20 16 17   Temp:   98.1 F (36.7 C)   TempSrc:      SpO2: 95% 95% 95% 95%  Weight:      Height:         Constitutional: NAD, no diaphoresis   Eyes: PERTLA, periorbital ecchymosis on right  ENMT: Mucous membranes are moist. Posterior pharynx clear of any exudate or lesions.   Neck: supple, no masses  Respiratory: Diminished bilaterally with fine rales, no wheezing. No accessory muscle use. Speaking in complete sentences.  Cardiovascular: S1 & S2 heard, regular rate and rhythm. JVD.  Abdomen: No tenderness, soft. Bowel sounds active.  Musculoskeletal: no clubbing / cyanosis. No joint deformity upper and lower extremities.   Skin: no significant rashes, lesions, ulcers. Warm, dry, well-perfused. Neurologic: CN 2-12 grossly  intact. Moving all extremities. Alert and oriented.  Psychiatric: Pleasant. Cooperative.    Labs and Imaging on Admission: I have personally reviewed following labs and imaging studies  CBC: Recent Labs  Lab 04/06/24 1802  WBC 12.2*  NEUTROABS 10.2*  HGB 13.9  HCT 42.3  MCV 92.2  PLT 309   Basic Metabolic Panel: Recent Labs  Lab 04/06/24 1802  NA 133*  K 4.3  CL 103  CO2 21*  GLUCOSE 300*  BUN 22  CREATININE 1.44*  CALCIUM  8.5*  MG 2.0   GFR: Estimated Creatinine Clearance: 41.7 mL/min (A) (by C-G formula based on SCr of 1.44 mg/dL (H)). Liver Function Tests: Recent Labs  Lab 04/06/24 1802  AST 36  ALT 23  ALKPHOS 80  BILITOT 1.0  PROT 6.7  ALBUMIN 3.7   No results for input(s): "LIPASE", "AMYLASE" in the last 168 hours. No results for input(s): "AMMONIA" in the last 168 hours. Coagulation Profile: No results for input(s): "INR", "PROTIME" in the last 168 hours. Cardiac Enzymes: No results for input(s): "CKTOTAL", "CKMB", "CKMBINDEX", "TROPONINI" in the last 168 hours. BNP (last 3 results) Recent Labs    01/15/24 1156  PROBNP 165.0*   HbA1C: No results for input(s): "HGBA1C" in the last 72 hours. CBG: Recent Labs  Lab 04/06/24 2007 04/06/24 2325  GLUCAP 256* 263*   Lipid Profile: No results for input(s): "CHOL", "HDL", "LDLCALC", "TRIG", "CHOLHDL", "LDLDIRECT" in the last 72 hours. Thyroid  Function Tests: No results for input(s): "TSH", "T4TOTAL", "FREET4", "T3FREE", "THYROIDAB" in the last 72 hours. Anemia Panel: No results for input(s): "VITAMINB12", "FOLATE", "FERRITIN", "TIBC", "IRON ", "RETICCTPCT" in the last 72 hours. Urine analysis:    Component Value Date/Time   COLORURINE YELLOW 10/03/2019 1307   APPEARANCEUR HAZY (A) 10/03/2019 1307   LABSPEC 1.019 10/03/2019 1307   PHURINE 5.0 10/03/2019 1307   GLUCOSEU NEGATIVE 10/03/2019 1307   HGBUR NEGATIVE 10/03/2019 1307   BILIRUBINUR NEGATIVE 10/03/2019 1307   KETONESUR 5 (A)  10/03/2019 1307   PROTEINUR NEGATIVE 10/03/2019 1307   NITRITE NEGATIVE 10/03/2019 1307   LEUKOCYTESUR NEGATIVE 10/03/2019 1307   Sepsis Labs: @LABRCNTIP (procalcitonin:4,lacticidven:4) )No results found for this or any previous visit (from the past 240 hours).   Radiological Exams on Admission: CT Angio Chest PE W and/or Wo Contrast Result Date: 04/06/2024 CLINICAL DATA:  Pulmonary embolism (PE) suspected, high prob Near syncope. EXAM:  CT ANGIOGRAPHY CHEST WITH CONTRAST TECHNIQUE: Multidetector CT imaging of the chest was performed using the standard protocol during bolus administration of intravenous contrast. Multiplanar CT image reconstructions and MIPs were obtained to evaluate the vascular anatomy. RADIATION DOSE REDUCTION: This exam was performed according to the departmental dose-optimization program which includes automated exposure control, adjustment of the mA and/or kV according to patient size and/or use of iterative reconstruction technique. CONTRAST:  75mL OMNIPAQUE  IOHEXOL  350 MG/ML SOLN COMPARISON:  Radiograph earlier today.  Chest CT 07/23/2023 FINDINGS: Cardiovascular: There are no filling defects within the pulmonary arteries to suggest pulmonary embolus. Aortic atherosclerosis without aneurysm. No evidence of acute aortic findings on this exam not tailored to aortic assessment. There are coronary artery calcifications. Heart is mildly enlarged. No pericardial effusion. Mediastinum/Nodes: Mild mediastinal adenopathy. Subcarinal/low infrahilar node measures 13 mm series 4, image 49. Anterior paratracheal node measures 15 mm series 4, image 36. There also bone prominent bilateral hilar nodes, more so on the right. Decompressed esophagus. Lungs/Pleura: Mild emphysema. Increase in subpleural reticulation and ground-glass from prior exam, basilar predominant. This may represent progression of interstitial lung disease or superimposed atypical infection. No confluent consolidation. No pleural  fluid. No no bronchial lesion or debris. Upper Abdomen: Mild contrast refluxing into the hepatic veins and IVC. Cholecystectomy. No acute upper abdominal findings. Musculoskeletal: There are no acute or suspicious osseous abnormalities. Review of the MIP images confirms the above findings. IMPRESSION: 1. No pulmonary embolus. 2. Increase in subpleural reticulation and ground-glass from prior exam, basilar predominant. This may represent progression of interstitial lung disease or superimposed atypical infection. 3. Mild mediastinal and hilar adenopathy, likely reactive. 4. Mild cardiomegaly. Of contrast refluxing into the hepatic veins can be seen with right heart dysfunction. Coronary artery calcifications. Aortic Atherosclerosis (ICD10-I70.0) and Emphysema (ICD10-J43.9). Electronically Signed   By: Chadwick Colonel M.D.   On: 04/06/2024 22:44   DG Chest Portable 1 View Result Date: 04/06/2024 CLINICAL DATA:  Status post fall. EXAM: PORTABLE CHEST 1 VIEW COMPARISON:  May 18, 2023 FINDINGS: The cardiac silhouette is mildly enlarged and unchanged in size. There is marked severity calcification of the thoracic aorta. Mild, diffuse, chronic appearing increased interstitial lung markings are seen. There is no evidence of focal consolidation, pleural effusion or pneumothorax. Multilevel degenerative changes are seen throughout the thoracic spine. IMPRESSION: Stable cardiomegaly and chronic appearing increased interstitial lung markings without evidence of acute or active cardiopulmonary disease. Electronically Signed   By: Virgle Grime M.D.   On: 04/06/2024 20:51   CT Head Wo Contrast Result Date: 04/06/2024 CLINICAL DATA:  Head trauma, near syncope. EXAM: CT HEAD WITHOUT CONTRAST TECHNIQUE: Contiguous axial images were obtained from the base of the skull through the vertex without intravenous contrast. RADIATION DOSE REDUCTION: This exam was performed according to the departmental dose-optimization program  which includes automated exposure control, adjustment of the mA and/or kV according to patient size and/or use of iterative reconstruction technique. COMPARISON:  None Available. FINDINGS: Brain: No acute intracranial hemorrhage. No CT evidence of acute infarct. No edema, mass effect, or midline shift. The basilar cisterns are patent. Ventricles: The ventricles are normal. Vascular: Atherosclerotic calcifications of the carotid siphons and intracranial vertebral arteries. No hyperdense vessel. Skull: No acute or aggressive finding. Orbits: Bilateral lens replacement. Mild soft tissue swelling involving the superior preseptal soft tissues of the right orbit. Sinuses: Mild mucosal thickening in the ethmoid and sphenoid sinuses. Other: Large right frontal/forehead scalp hematoma. Soft tissue swelling extends over the supraorbital ridge with  partial involvement of the superior preseptal soft tissues of the right orbit. Mastoid air cells are clear. IMPRESSION: No CT evidence of acute intracranial abnormality. Right frontal scalp/forehead hematoma. Soft tissue swelling extends over the supraorbital ridge with involvement of the superior preseptal soft tissues of the right orbit. Electronically Signed   By: Denny Flack M.D.   On: 04/06/2024 20:24     Assessment/Plan   1. Acute on chronic hypoxic respiratory failure  - Requiring 15 Lpm supplemental O2 in ED, up from his usual 4-5 Lpm - CTA chest findings concerning of progressed pulmonary fibrosis vs superimposed atypical infection  - Exam and BNP suggestive of CHF  - Check procalcitonin, culture sputum, continue empiric antibiotics, diurese with IV Lasix , continue supplemental O2  Addendum: Pulmonology recommends adding high-dose steroids and will formally consult 5/12.    2. Syncope  - Check EKG, orthostatic vitals, and echocardiogram, continue cardiac monitoring   3. Acute on chronic diastolic CHF  - BNP is up and JVD noted  - Diurese with IV Lasix ,  monitor weight and I/Os, follow-up echo findings    4. Pulmonary HTN - Treprostinil   5. Pulmonary fibrosis  - Pirfenidone , supplemental O2   6. CKD 3A  - Appears close to baseline  - Renally-dose medications, monitor closely while diuresing    7. Insulin -dependent DM  - Check CBGs, continue long- and short-acting insulin    8. Elevated troponin - No chest discomfort, likely demand ischemia in setting of respiratory failure  - Treat underlying respiratory illness, trend troponin, follow-up on echo findings    DVT prophylaxis: Lovenox   Code Status: Full  Level of Care: Level of care: Stepdown Family Communication: Son and wife at bedside  Disposition Plan:  Patient is from: Home  Anticipated d/c is to: TBD Anticipated d/c date is: 04/09/24  Patient currently: Pending improved respiratory status, echocardiogram, cardiac monitoring  Consults called: None  Admission status: Inpatient     Walton Guppy, MD [facility]  04/06/2024, 11:27 PM

## 2024-04-06 NOTE — ED Notes (Signed)
 Patient transported to CT

## 2024-04-06 NOTE — ED Provider Notes (Signed)
 [place] [facility] AT [facility] Provider Note   CSN: [id number] Arrival date & time: 04/06/24  1744     History {Add pertinent medical, surgical, social history, OB history to HPI:1} Chief Complaint  Patient presents with   Near Syncope    Christian Woodard is a 78 y.o. male with ILD w/ chronic hypoxi resp failure on 4L/min [place] at baseline, pulmonary hypertension, T2DM, HTN, anemia, GERD, dysphagia who presents with syncopal episode.  Patient was in his normal state of health when he felt as though he became very lightheaded after using the bathroom and lost consciousness.  [name] and hit his right forehead/eyebrow on the ground.  Son and wife at bedside state that the patient was mostly out of consciousness for approximately 7 to 8 minutes.  He then began to respond slowly.  Patient does not recall the event exactly but did not have any chest pain beforehand.  Has not felt short of breath or had any increased cough.  Has not noticed any fever/chills, urinary symptoms, leg swelling, changes to his medications.   Pt c/o nausea with no vomiting.  No sick contacts.  Denies any melena, hematochezia, diarrhea.   Past Medical History:  Diagnosis Date   Arthritis    Barrett's esophagus    With low-grade dysplasia, following with [facility]   Diabetes mellitus    GERD (gastroesophageal reflux disease)    Hypercholesteremia        Home Medications Prior to Admission medications   Medication Sig Start Date End Date Taking? Authorizing Provider  acetaminophen  (TYLENOL ) 500 MG tablet Take 500 mg by mouth every 6 (six) hours as needed (headaches/pain.).    [provider]  aspirin  EC 81 MG tablet Take 81 mg by mouth every evening. Swallow whole.    [provider]  atorvastatin  (LIPITOR) 10 MG tablet Take 10 mg by mouth at bedtime.    [provider]  budesonide -formoterol  (SYMBICORT ) 160-4.5 MCG/ACT inhaler Inhale 2 puffs into the lungs in the morning  and at bedtime. Patient not taking: Reported on 02/08/2024 05/19/23   [name], MD  chlorpheniramine (CHLOR-TRIMETON) 4 MG tablet Take 4 mg by mouth daily.    [provider]  cyanocobalamin  (VITAMIN B12) 1000 MCG tablet Take 1,000 mcg by mouth daily.    [provider]  famotidine  (PEPCID ) 10 MG tablet Take 10 mg by mouth every evening.    [provider]  Ferrous Gluconate  (IRON  27 PO) Take 1 tablet by mouth daily.    [provider]  furosemide  (LASIX ) 20 MG tablet Take 1 tablet (20 mg total) by mouth daily. 05/19/23 05/18/24  [name], MD  gabapentin  (NEURONTIN ) 300 MG capsule Take 1 capsule (300 mg total) by mouth 2 (two) times daily. 05/19/23   [name], MD  glipiZIDE  (GLUCOTROL  XL) 5 MG 24 hr tablet Take 5 mg by mouth daily with breakfast.    [provider]  Ipratropium-Albuterol  (COMBIVENT  RESPIMAT) 20-100 MCG/ACT AERS respimat Inhale 1 puff into the lungs every 6 (six) hours as needed for wheezing or shortness of breath. 05/19/23   [name], MD  LANTUS  SOLOSTAR 100 UNIT/ML Solostar Pen Inject 40 Units into the skin in the morning. 08/23/16   [provider]  losartan  (COZAAR ) 50 MG tablet Take 50 mg by mouth in the morning.    [provider]  metFORMIN  (GLUCOPHAGE ) 1000 MG tablet Take 1,000 mg by mouth 2 (two) times daily. 07/22/16   [provider]  NOVOLOG  FLEXPEN 100 UNIT/ML FlexPen Inject 6 Units into the skin 2 (two) times daily with a meal. 02/05/23   [provider]  pantoprazole  (PROTONIX ) 40 MG tablet TAKE 1 TABLET BY MOUTH ONE TIME DAILY 30-60 MINUTES BEFORE BREAKFAST 05/22/23   [name], PA-C  Pirfenidone  267 MG TABS Take 2 tablets (534 mg total) by mouth in the morning, at noon, and at bedtime. **low dose as maintenance** 01/11/24   [name], MD  potassium chloride  (MICRO-K ) 10 MEQ CR capsule Take 10 mEq by mouth daily. 10/24/23   [provider]  Treprostinil  (TYVASO DPI INSTITUTIONAL KIT) 32 MCG POWD Inhale 2 puffs into the lungs in the morning, at noon, in the evening, and at bedtime.    [provider]      Allergies    Patient has no known allergies.    Review of Systems   Review of Systems A 10 point review of systems was performed and is negative unless otherwise reported in HPI.  Physical Exam Updated Vital Signs BP 138/84   Pulse 92   Temp 98.1 F (36.7 C)   Resp 16   Ht 5\' 6"  (1.676 m)   Wt 78.5 kg   SpO2 95%   BMI 27.93 kg/m  Physical Exam General: Chronically ill-appearing male, lying in bed.  HEENT: PERRLA, Sclera anicteric, MMM, trachea midline.  Hematoma to the right eyebrow without any overlying laceration.  No skull depressions or deformities.  Stable midface and nasal bridge.   Cardiology: RRR, no murmurs/rubs/gallops.  Resp: Coarse lung sounds bilaterally on 15 L heated high flow nasal cannula Abd: Soft, non-tender, non-distended. No rebound tenderness or guarding.  GU: Deferred. MSK: No peripheral edema or signs of trauma. Extremities without deformity or TTP. No cyanosis or clubbing. Skin: Pale. warm, dry.  Back: No midline CT or L-spine tenderness palpation, deformities, step-offs. Neuro: A&Ox4, CNs II-XII grossly intact. MAEs. Sensation grossly intact.  Psych: Normal mood and affect.   ED Results / Procedures / Treatments   Labs (all labs ordered are listed, but only abnormal results are displayed) Labs Reviewed  CBC WITH DIFFERENTIAL/PLATELET - Abnormal; Notable for the following components:      Result Value   WBC 12.2 (*)    Neutro Abs 10.2 (*)    Abs Immature Granulocytes 0.10 (*)    All other components within normal limits  COMPREHENSIVE METABOLIC PANEL WITH GFR - Abnormal; Notable for the following components:   Sodium 133 (*)    CO2 21 (*)    Glucose, Bld 300 (*)    Creatinine, Ser 1.44 (*)    Calcium  8.5 (*)    GFR, Estimated 50 (*)    All other components within normal limits   BRAIN NATRIURETIC PEPTIDE - Abnormal; Notable for the following components:   B Natriuretic Peptide 416.0 (*)    All other components within normal limits  BLOOD GAS, VENOUS - Abnormal; Notable for the following components:   Bicarbonate 28.8 (*)    Acid-Base Excess 2.4 (*)    All other components within normal limits  CBG MONITORING, ED - Abnormal; Notable for the following components:   Glucose-Capillary 256 (*)    All other components within normal limits  TROPONIN I (HIGH SENSITIVITY) - Abnormal; Notable for the following components:   Troponin I (High Sensitivity) 31 (*)    All other components within normal limits  TROPONIN I (HIGH SENSITIVITY) - Abnormal; Notable for the following components:   Troponin I (High  Sensitivity) 45 (*)    All other components within normal limits  EXPECTORATED SPUTUM ASSESSMENT W GRAM STAIN, RFLX TO RESP C  MAGNESIUM  URINALYSIS, ROUTINE W REFLEX MICROSCOPIC  HEMOGLOBIN A1C  BASIC METABOLIC PANEL WITH GFR  CBC  STREP PNEUMONIAE URINARY ANTIGEN  LEGIONELLA PNEUMOPHILA SEROGP 1 UR AG  PROCALCITONIN    EKG None  Radiology CT Angio Chest PE W and/or Wo Contrast Result Date: 04/06/2024 CLINICAL DATA:  Pulmonary embolism (PE) suspected, high prob Near syncope. EXAM: CT ANGIOGRAPHY CHEST WITH CONTRAST TECHNIQUE: Multidetector CT imaging of the chest was performed using the standard protocol during bolus administration of intravenous contrast. Multiplanar CT image reconstructions and MIPs were obtained to evaluate the vascular anatomy. RADIATION DOSE REDUCTION: This exam was performed according to the departmental dose-optimization program which includes automated exposure control, adjustment of the mA and/or kV according to patient size and/or use of iterative reconstruction technique. CONTRAST:  75mL OMNIPAQUE  IOHEXOL  350 MG/ML SOLN COMPARISON:  Radiograph earlier today.  Chest CT 07/23/2023 FINDINGS: Cardiovascular: There are no filling defects within  the pulmonary arteries to suggest pulmonary embolus. Aortic atherosclerosis without aneurysm. No evidence of acute aortic findings on this exam not tailored to aortic assessment. There are coronary artery calcifications. Heart is mildly enlarged. No pericardial effusion. Mediastinum/Nodes: Mild mediastinal adenopathy. Subcarinal/low infrahilar node measures 13 mm series 4, image 49. Anterior paratracheal node measures 15 mm series 4, image 36. There also bone prominent bilateral hilar nodes, more so on the right. Decompressed esophagus. Lungs/Pleura: Mild emphysema. Increase in subpleural reticulation and ground-glass from prior exam, basilar predominant. This may represent progression of interstitial lung disease or superimposed atypical infection. No confluent consolidation. No pleural fluid. No no bronchial lesion or debris. Upper Abdomen: Mild contrast refluxing into the hepatic veins and IVC. Cholecystectomy. No acute upper abdominal findings. Musculoskeletal: There are no acute or suspicious osseous abnormalities. Review of the MIP images confirms the above findings. IMPRESSION: 1. No pulmonary embolus. 2. Increase in subpleural reticulation and ground-glass from prior exam, basilar predominant. This may represent progression of interstitial lung disease or superimposed atypical infection. 3. Mild mediastinal and hilar adenopathy, likely reactive. 4. Mild cardiomegaly. Of contrast refluxing into the hepatic veins can be seen with right heart dysfunction. Coronary artery calcifications. Aortic Atherosclerosis (ICD10-I70.0) and Emphysema (ICD10-J43.9). Electronically Signed   By: Chadwick Colonel M.D.   On: 04/06/2024 22:44   DG Chest Portable 1 View Result Date: 04/06/2024 CLINICAL DATA:  Status post fall. EXAM: PORTABLE CHEST 1 VIEW COMPARISON:  May 18, 2023 FINDINGS: The cardiac silhouette is mildly enlarged and unchanged in size. There is marked severity calcification of the thoracic aorta. Mild,  diffuse, chronic appearing increased interstitial lung markings are seen. There is no evidence of focal consolidation, pleural effusion or pneumothorax. Multilevel degenerative changes are seen throughout the thoracic spine. IMPRESSION: Stable cardiomegaly and chronic appearing increased interstitial lung markings without evidence of acute or active cardiopulmonary disease. Electronically Signed   By: Virgle Grime M.D.   On: 04/06/2024 20:51   CT Head Wo Contrast Result Date: 04/06/2024 CLINICAL DATA:  Head trauma, near syncope. EXAM: CT HEAD WITHOUT CONTRAST TECHNIQUE: Contiguous axial images were obtained from the base of the skull through the vertex without intravenous contrast. RADIATION DOSE REDUCTION: This exam was performed according to the departmental dose-optimization program which includes automated exposure control, adjustment of the mA and/or kV according to patient size and/or use of iterative reconstruction technique. COMPARISON:  None Available. FINDINGS: Brain: No acute intracranial hemorrhage.  No CT evidence of acute infarct. No edema, mass effect, or midline shift. The basilar cisterns are patent. Ventricles: The ventricles are normal. Vascular: Atherosclerotic calcifications of the carotid siphons and intracranial vertebral arteries. No hyperdense vessel. Skull: No acute or aggressive finding. Orbits: Bilateral lens replacement. Mild soft tissue swelling involving the superior preseptal soft tissues of the right orbit. Sinuses: Mild mucosal thickening in the ethmoid and sphenoid sinuses. Other: Large right frontal/forehead scalp hematoma. Soft tissue swelling extends over the supraorbital ridge with partial involvement of the superior preseptal soft tissues of the right orbit. Mastoid air cells are clear. IMPRESSION: No CT evidence of acute intracranial abnormality. Right frontal scalp/forehead hematoma. Soft tissue swelling extends over the supraorbital ridge with involvement of the  superior preseptal soft tissues of the right orbit. Electronically Signed   By: Denny Flack M.D.   On: 04/06/2024 20:24    Procedures Procedures  {Document cardiac monitor, telemetry assessment procedure when appropriate:1}  Medications Ordered in ED Medications  azithromycin (ZITHROMAX) 500 mg in sodium chloride  0.9 % 250 mL IVPB (has no administration in time range)  cefTRIAXone (ROCEPHIN) 1 g in sodium chloride  0.9 % 100 mL IVPB (1 g Intravenous New Bag/Given 04/06/24 2307)  aspirin  EC tablet 81 mg (has no administration in time range)  atorvastatin  (LIPITOR) tablet 10 mg (has no administration in time range)  furosemide  (LASIX ) tablet 20 mg (has no administration in time range)  losartan  (COZAAR ) tablet 50 mg (has no administration in time range)  Treprostinil POWD 2 puff (has no administration in time range)  pantoprazole  (PROTONIX ) EC tablet 40 mg (has no administration in time range)  gabapentin  (NEURONTIN ) capsule 300 mg (has no administration in time range)  Pirfenidone  TABS 534 mg (has no administration in time range)  insulin  aspart (novoLOG ) injection 0-6 Units (has no administration in time range)  insulin  aspart (novoLOG ) injection 0-5 Units (has no administration in time range)  insulin  glargine-yfgn (SEMGLEE ) injection 20 Units (has no administration in time range)  insulin  aspart (novoLOG ) injection 3 Units (has no administration in time range)  enoxaparin  (LOVENOX ) injection 40 mg (has no administration in time range)  sodium chloride  flush (NS) 0.9 % injection 3 mL (0 mLs Intravenous Hold 04/06/24 2314)  acetaminophen  (TYLENOL ) tablet 650 mg (has no administration in time range)    Or  acetaminophen  (TYLENOL ) suppository 650 mg (has no administration in time range)  senna-docusate (Senokot-S) tablet 1 tablet (has no administration in time range)  ipratropium-albuterol  (DUONEB) 0.5-2.5 (3) MG/3ML nebulizer solution 3 mL (has no administration in time range)   cefTRIAXone (ROCEPHIN) 2 g in sodium chloride  0.9 % 100 mL IVPB (has no administration in time range)  ondansetron  (ZOFRAN ) injection 4 mg (4 mg Intravenous Given 04/06/24 1846)  iohexol  (OMNIPAQUE ) 350 MG/ML injection 75 mL (75 mLs Intravenous Contrast Given 04/06/24 2152)  acetaminophen  (TYLENOL ) tablet 1,000 mg (1,000 mg Oral Given 04/06/24 2305)    ED Course/ Medical Decision Making/ A&P                          Medical Decision Making Amount and/or Complexity of Data Reviewed Labs: ordered. Decision-making details documented in ED Course. Radiology: ordered. Decision-making details documented in ED Course.  Risk OTC drugs. Prescription drug management. Decision regarding hospitalization.    This patient presents to the ED for concern of ***, this involves an extensive number of treatment options, and is a complaint that carries with it a high risk  of complications and morbidity.  I considered the following differential and admission for this acute, potentially life threatening condition.   MDM:    ***  Clinical Course as of 04/06/24 2321  Sun Apr 06, 2024  1815 WBC(!): 12.2 +leukocytosis [HN]  1831 Creatinine(!): 1.44 Similar to 1.41 three months ago [HN]  1859 R eyebrow hematoma noted to have gotten mildly bigger in size since arrival to ED. Compressive dressing placed over R eyebrow w/ abd pad and kerlix.  [HN]  1900 Troponin I (High Sensitivity)(!): 31 Mildly elevated will trend [HN]  2140 DG Chest Portable 1 View Stable cardiomegaly and chronic appearing increased interstitial lung markings without evidence of acute or active cardiopulmonary disease.   [HN]  2140 With syncope, mild elevation in troponin, worsened hypoxia from baseline and no findings on CXR, will get CT PE. Also consider ILD flare. [HN]  2242 SpO2: 91 % On 15L HFNC [HN]  2249 CT Angio Chest PE W and/or Wo Contrast 1. No pulmonary embolus. 2. Increase in subpleural reticulation and ground-glass  from prior exam, basilar predominant. This may represent progression of interstitial lung disease or superimposed atypical infection. 3. Mild mediastinal and hilar adenopathy, likely reactive. 4. Mild cardiomegaly. Of contrast refluxing into the hepatic veins can be seen with right heart dysfunction. Coronary artery calcifications.   [HN]  2304 Discussed with patient and his family.  Presumed that the syncopal episode earlier was hypoxia related given his increased oxygen requirement.  Satting in the mid 90s on 15 L heated high flow nasal cannula.  Will give antibiotics for possible atypical infection and consult with pulmonology for further recommendations.  Patient is admitted to hospitalist Dr. Brice Campi. [HN]  2316 On reevaluation of the hematoma, it is gone down in size after the pressure dressing was removed and now patient does have periorbital ecchymosis on the right eye. [HN]    Clinical Course User Index [HN] Merdis Stalling, MD    Labs: I Ordered, and personally interpreted labs.  The pertinent results include:  ***  Imaging Studies ordered: I ordered imaging studies including *** I independently visualized and interpreted imaging. I agree with the radiologist interpretation  Additional history obtained from ***.  External records from outside source obtained and reviewed including ***  Cardiac Monitoring: The patient was maintained on a cardiac monitor.  I personally viewed and interpreted the cardiac monitored which showed an underlying rhythm of: ***  Reevaluation: After the interventions noted above, I reevaluated the patient and found that they have :{resolved/improved/worsened:23923::"improved"}  Social Determinants of Health: ***  Disposition:  ***  Co morbidities that complicate the patient evaluation  Past Medical History:  Diagnosis Date   Arthritis    Barrett's esophagus    With low-grade dysplasia, following with [facility]   Diabetes mellitus    GERD  (gastroesophageal reflux disease)    Hypercholesteremia      Medicines Meds ordered this encounter  Medications   ondansetron  (ZOFRAN ) injection 4 mg   iohexol  (OMNIPAQUE ) 350 MG/ML injection 75 mL   azithromycin (ZITHROMAX) 500 mg in sodium chloride  0.9 % 250 mL IVPB    Antibiotic Indication::   CAP   cefTRIAXone (ROCEPHIN) 1 g in sodium chloride  0.9 % 100 mL IVPB    Antibiotic Indication::   CAP   acetaminophen  (TYLENOL ) tablet 1,000 mg   aspirin  EC tablet 81 mg    Swallow whole.     atorvastatin  (LIPITOR) tablet 10 mg   furosemide  (LASIX ) tablet 20 mg  losartan  (COZAAR ) tablet 50 mg   Treprostinil POWD 2 puff   pantoprazole  (PROTONIX ) EC tablet 40 mg   gabapentin  (NEURONTIN ) capsule 300 mg   Pirfenidone  TABS 534 mg    **low dose as maintenance**     insulin  aspart (novoLOG ) injection 0-6 Units    Correction coverage::   Very Sensitive (ESRD/Dialysis)    CBG < 70::   Implement Hypoglycemia Standing Orders and refer to Hypoglycemia Standing Orders sidebar report    CBG 70 - 120::   0 units    CBG 121 - 150::   0 units    CBG 151 - 200::   1 unit    CBG 201-250::   2 units    CBG 251-300::   3 units    CBG 301-350::   4 units    CBG 351-400::   5 units    CBG > 400:   Give 6 units and call MD   insulin  aspart (novoLOG ) injection 0-5 Units    Correction coverage::   HS scale    CBG < 70::   Implement Hypoglycemia Standing Orders and refer to Hypoglycemia Standing Orders sidebar report    CBG 70 - 120::   0 units    CBG 121 - 150::   0 units    CBG 151 - 200::   0 units    CBG 201 - 250::   2 units    CBG 251 - 300::   3 units    CBG 301 - 350::   4 units    CBG 351 - 400::   5 units    CBG > 400:   call MD and obtain STAT lab verification   insulin  glargine-yfgn (SEMGLEE ) injection 20 Units   insulin  aspart (novoLOG ) injection 3 Units   enoxaparin  (LOVENOX ) injection 40 mg   sodium chloride  flush (NS) 0.9 % injection 3 mL   OR Linked Order Group    acetaminophen   (TYLENOL ) tablet 650 mg    acetaminophen  (TYLENOL ) suppository 650 mg   senna-docusate (Senokot-S) tablet 1 tablet   ipratropium-albuterol  (DUONEB) 0.5-2.5 (3) MG/3ML nebulizer solution 3 mL   cefTRIAXone (ROCEPHIN) 2 g in sodium chloride  0.9 % 100 mL IVPB    Antibiotic Indication::   CAP    I have reviewed the patients home medicines and have made adjustments as needed  Problem List / ED Course: Problem List Items Addressed This Visit   None Visit Diagnoses       Acute on chronic hypoxic respiratory failure (HCC)    -  Primary            {Document critical care time when appropriate:1} {Document review of labs and clinical decision tools ie heart score, Chads2Vasc2 etc:1}  {Document your independent review of radiology images, and any outside records:1} {Document your discussion with family members, caretakers, and with consultants:1} {Document social determinants of health affecting pt's care:1} {Document your decision making why or why not admission, treatments were needed:1}  This note was created using dictation software, which may contain spelling or grammatical errors.

## 2024-04-06 NOTE — ED Notes (Signed)
 Pt has large hematoma in and around the right eye.

## 2024-04-06 NOTE — ED Notes (Signed)
 Patient has facial swelling to right eyebrow area, along with ecchymosis, and complaint of a headache. Dr. Drury Geralds made aware.

## 2024-04-07 ENCOUNTER — Inpatient Hospital Stay (HOSPITAL_COMMUNITY)

## 2024-04-07 ENCOUNTER — Other Ambulatory Visit (HOSPITAL_COMMUNITY): Payer: Self-pay | Admitting: *Deleted

## 2024-04-07 DIAGNOSIS — R55 Syncope and collapse: Secondary | ICD-10-CM | POA: Diagnosis not present

## 2024-04-07 DIAGNOSIS — J8410 Pulmonary fibrosis, unspecified: Secondary | ICD-10-CM | POA: Diagnosis not present

## 2024-04-07 DIAGNOSIS — J9621 Acute and chronic respiratory failure with hypoxia: Secondary | ICD-10-CM | POA: Diagnosis not present

## 2024-04-07 DIAGNOSIS — I5033 Acute on chronic diastolic (congestive) heart failure: Secondary | ICD-10-CM | POA: Diagnosis not present

## 2024-04-07 LAB — ECHOCARDIOGRAM COMPLETE
AR max vel: 2.44 cm2
AV Area VTI: 2.24 cm2
AV Area mean vel: 2.17 cm2
AV Mean grad: 3.3 mmHg
AV Peak grad: 6.2 mmHg
Ao pk vel: 1.24 m/s
Area-P 1/2: 5.02 cm2
Est EF: >75
Height: 66 in
S' Lateral: 1.7 cm
Weight: 2630 [oz_av]

## 2024-04-07 LAB — HEMOGLOBIN A1C
Hgb A1c MFr Bld: 7.6 % — ABNORMAL HIGH (ref 4.8–5.6)
Mean Plasma Glucose: 171 mg/dL

## 2024-04-07 LAB — CBC
HCT: 41.4 % (ref 39.0–52.0)
Hemoglobin: 13.9 g/dL (ref 13.0–17.0)
MCH: 30.7 pg (ref 26.0–34.0)
MCHC: 33.6 g/dL (ref 30.0–36.0)
MCV: 91.4 fL (ref 80.0–100.0)
Platelets: 309 10*3/uL (ref 150–400)
RBC: 4.53 MIL/uL (ref 4.22–5.81)
RDW: 15.3 % (ref 11.5–15.5)
WBC: 14.8 10*3/uL — ABNORMAL HIGH (ref 4.0–10.5)
nRBC: 0 % (ref 0.0–0.2)

## 2024-04-07 LAB — CBG MONITORING, ED: Glucose-Capillary: 325 mg/dL — ABNORMAL HIGH (ref 70–99)

## 2024-04-07 LAB — URINALYSIS, ROUTINE W REFLEX MICROSCOPIC
Bilirubin Urine: NEGATIVE
Glucose, UA: 150 mg/dL — AB
Hgb urine dipstick: NEGATIVE
Ketones, ur: NEGATIVE mg/dL
Leukocytes,Ua: NEGATIVE
Nitrite: NEGATIVE
Protein, ur: NEGATIVE mg/dL
Specific Gravity, Urine: 1.021 (ref 1.005–1.030)
pH: 5 (ref 5.0–8.0)

## 2024-04-07 LAB — MRSA NEXT GEN BY PCR, NASAL: MRSA by PCR Next Gen: NOT DETECTED

## 2024-04-07 LAB — GLUCOSE, CAPILLARY
Glucose-Capillary: 335 mg/dL — ABNORMAL HIGH (ref 70–99)
Glucose-Capillary: 356 mg/dL — ABNORMAL HIGH (ref 70–99)
Glucose-Capillary: 335 mg/dL — ABNORMAL HIGH (ref 70–99)

## 2024-04-07 LAB — BASIC METABOLIC PANEL WITH GFR
Anion gap: 12 (ref 5–15)
BUN: 23 mg/dL (ref 8–23)
CO2: 26 mmol/L (ref 22–32)
Calcium: 9.2 mg/dL (ref 8.9–10.3)
Chloride: 98 mmol/L (ref 98–111)
Creatinine, Ser: 1.38 mg/dL — ABNORMAL HIGH (ref 0.61–1.24)
GFR, Estimated: 52 mL/min — ABNORMAL LOW (ref >60)
Glucose, Bld: 310 mg/dL — ABNORMAL HIGH (ref 70–99)
Potassium: 5.1 mmol/L (ref 3.5–5.1)
Sodium: 136 mmol/L (ref 135–145)

## 2024-04-07 LAB — STREP PNEUMONIAE URINARY ANTIGEN: Strep Pneumo Urinary Antigen: NEGATIVE

## 2024-04-07 LAB — PROCALCITONIN
Procalcitonin: <0.1 ng/mL
Procalcitonin: 0.1 ng/mL

## 2024-04-07 LAB — TROPONIN I (HIGH SENSITIVITY): Troponin I (High Sensitivity): 53 ng/L — ABNORMAL HIGH (ref <18)

## 2024-04-07 LAB — SEDIMENTATION RATE: Sed Rate: 8 mm/h (ref 0–16)

## 2024-04-07 MED ORDER — LOSARTAN POTASSIUM 25 MG PO TABS
25.0000 mg | ORAL_TABLET | Freq: Every day | ORAL | Status: DC
  Administered 2024-04-07 – 2024-04-10 (×4): 25 mg via ORAL
  Filled 2024-04-07 (×4): qty 1

## 2024-04-07 MED ORDER — METHYLPREDNISOLONE SODIUM SUCC 40 MG IJ SOLR
40.0000 mg | INTRAMUSCULAR | Status: DC
  Administered 2024-04-08: 40 mg via INTRAVENOUS
  Filled 2024-04-07: qty 1

## 2024-04-07 MED ORDER — INSULIN GLARGINE-YFGN 100 UNIT/ML ~~LOC~~ SOLN
20.0000 [IU] | Freq: Two times a day (BID) | SUBCUTANEOUS | Status: DC
  Administered 2024-04-07 – 2024-04-10 (×6): 20 [IU] via SUBCUTANEOUS
  Filled 2024-04-07 (×8): qty 0.2

## 2024-04-07 MED ORDER — INSULIN ASPART 100 UNIT/ML IJ SOLN
6.0000 [IU] | Freq: Three times a day (TID) | INTRAMUSCULAR | Status: DC
  Administered 2024-04-07 – 2024-04-10 (×9): 6 [IU] via SUBCUTANEOUS

## 2024-04-07 MED ORDER — LATANOPROST 0.005 % OP SOLN
1.0000 [drp] | Freq: Every day | OPHTHALMIC | Status: DC
  Administered 2024-04-07 – 2024-04-09 (×3): 1 [drp] via OPHTHALMIC
  Filled 2024-04-07: qty 2.5

## 2024-04-07 MED ORDER — VITAMIN B-12 1000 MCG PO TABS
1000.0000 ug | ORAL_TABLET | Freq: Every day | ORAL | Status: DC
  Administered 2024-04-07 – 2024-04-10 (×4): 1000 ug via ORAL
  Filled 2024-04-07 (×4): qty 1

## 2024-04-07 MED ORDER — CHLORHEXIDINE GLUCONATE CLOTH 2 % EX PADS
6.0000 | MEDICATED_PAD | Freq: Every day | CUTANEOUS | Status: DC
  Administered 2024-04-07: 6 via TOPICAL

## 2024-04-07 MED ORDER — TREPROSTINIL 64 MCG IN POWD
1.0000 | Freq: Four times a day (QID) | RESPIRATORY_TRACT | Status: DC
  Administered 2024-04-07 – 2024-04-10 (×11): 1 via RESPIRATORY_TRACT
  Filled 2024-04-07: qty 1

## 2024-04-07 MED ORDER — SODIUM CHLORIDE 0.9 % IV SOLN
1000.0000 mg | INTRAVENOUS | Status: DC
Start: 2024-04-07 — End: 2024-04-07
  Administered 2024-04-07: 1000 mg via INTRAVENOUS
  Filled 2024-04-07 (×2): qty 16

## 2024-04-07 MED ORDER — PERFLUTREN LIPID MICROSPHERE
1.0000 mL | INTRAVENOUS | Status: AC | PRN
  Administered 2024-04-07: 3 mL via INTRAVENOUS

## 2024-04-07 MED ORDER — INSULIN ASPART 100 UNIT/ML IJ SOLN
0.0000 [IU] | Freq: Three times a day (TID) | INTRAMUSCULAR | Status: DC
  Administered 2024-04-07: 15 [IU] via SUBCUTANEOUS
  Administered 2024-04-08 (×2): 5 [IU] via SUBCUTANEOUS
  Administered 2024-04-08: 8 [IU] via SUBCUTANEOUS
  Administered 2024-04-09: 3 [IU] via SUBCUTANEOUS
  Administered 2024-04-09: 5 [IU] via SUBCUTANEOUS
  Administered 2024-04-10: 3 [IU] via SUBCUTANEOUS

## 2024-04-07 NOTE — Assessment & Plan Note (Signed)
 Patient had elevated BNP at 416 on admission.  Can be a confounder with severe pulmonary hypertension and CKD stage IIIa.  Echocardiogram ordered-pending -Continue with IV Lasix  -Daily weight and BMP -Strict taken output

## 2024-04-07 NOTE — Assessment & Plan Note (Signed)
 Currently requiring 15 L of oxygen, he was using 8 L at home over the past few weeks. CTA chest was negative for PE but did show some concern of progression of pulmonary via fibrosis versus superimposed atypical infection but procalcitonin is negative.  - Continuing empiric antibiotics due to his underlying risk factors -Pulmonology was consulted and they were recommending high-dose steroid -Follow recommendations from pulmonary -Continue with supportive care and supplemental oxygen-try weaning to baseline as tolerated

## 2024-04-07 NOTE — Progress Notes (Signed)
 Progress Note   Patient: Christian Woodard GNF:[id number] DOB: 1946/08/03 DOA: 04/06/2024     1 DOS: the patient was seen and examined on 04/07/2024   Brief hospital course: Taken from H&P.   Christian Woodard is a 78 y.o. male with medical history significant for pulmonary fibrosis, pulmonary hypertension, insulin -dependent diabetes mellitus, hypertension chronic diastolic CHF, and chronic hypoxic respiratory failure who presents with increased shortness of breath and syncope.   Patient has worsening shortness of breath for about 2 weeks, had a syncopal episode while walking to the bathroom with a brief loss of consciousness, fall and struck his head on floor.Aaron Aas  On presentation hypoxic requiring up to 15 L of oxygen, with mild tachycardia and tachypnea.  Labs with glucose of 300, creatinine 1.44, WBC 12,200, troponin 45 and BNP 416.  Procalcitonin negative. CTA negative for PE, increased subpleural opacities which can represent progression of interstitial lung disease or superimposed atypical infection.  Mild mediastinal and hilar adenopathy.  Patient was started on empiric antibiotics and IV diuresis.  Pulmonology was also consulted and they recommended to start him on high-dose steroid.  5/12: Vitals currently stable on 15 L of oxygen.  Slight worsening of leukocytosis but patient is on high-dose steroid.  Patient was using 8 L of oxygen over the past few weeks at home.  Assessment and Plan: * Acute on chronic respiratory failure with hypoxia (HCC) Currently requiring 15 L of oxygen, he was using 8 L at home over the past few weeks. CTA chest was negative for PE but did show some concern of progression of pulmonary via fibrosis versus superimposed atypical infection but procalcitonin is negative.  - Continuing empiric antibiotics due to his underlying risk factors -Pulmonology was consulted and they were recommending high-dose steroid -Follow recommendations from pulmonary -Continue  with supportive care and supplemental oxygen-try weaning to baseline as tolerated  Syncope Seems like vasovagal.  Mildly elevated troponin with a flat curve which is likely due to demand ischemia.  No chest pain. -Echocardiogram ordered-pending -Orthostatic vitals -Continue to monitor  Acute on chronic diastolic CHF (congestive heart failure) (HCC) Patient had elevated BNP at 416 on admission.  Can be a confounder with severe pulmonary hypertension and CKD stage IIIa.  Echocardiogram ordered-pending -Continue with IV Lasix  -Daily weight and BMP -Strict taken output  Pulmonary fibrosis (HCC) - Continuing home pirfenidone  -Started on high-dose steroid for concern of pulmonary fibrosis exacerbation/progression. - Pulmonary was consulted.  Pulmonary hypertension (HCC) - Continue Treprostinil   Type 2 diabetes mellitus without complication (HCC) CBG elevated, patient is on high-dose steroid. - Increasing the dose of Semglee  to 20 units twice daily -Increasing mealtime coverage -Making SSI moderate  CKD stage 3a, GFR 45-59 ml/min (HCC) Creatinine with some improvement at 1.38 today. Seems stable -Monitor renal function while patient is being diuresed -Avoid nephrotoxins   Subjective: Patient was seen and examined today.  Denies any chest pain or shortness of breath while resting.  Uses 8 L of oxygen over the past few weeks.  Complaining of progressively worsening dyspnea.  Physical Exam: Vitals:   04/07/24 1100 04/07/24 1200 04/07/24 1238 04/07/24 1247  BP: 121/77 113/73  125/74  Pulse: 85 86  91  Resp:    16  Temp:   97.9 F (36.6 C)   TempSrc:   Oral   SpO2: 93% 96%  94%  Weight:   74.6 kg   Height:   5\' 6"  (1.676 m)    General.  Frail elderly man, in  no acute distress.  Raccoon right eye with surrounding bruising. Pulmonary.  Scattered dry crackles bilaterally, normal respiratory effort. CV.  Regular rate and rhythm, no JVD, rub or murmur. Abdomen.  Soft, nontender,  nondistended, BS positive. CNS.  Alert and oriented .  No focal neurologic deficit. Extremities.  No edema, no cyanosis, pulses intact and symmetrical. Psychiatry.  Judgment and insight appears normal.   Data Reviewed: Prior data reviewed  Family Communication: Talked with wife on phone.  Disposition: Status is: Inpatient Remains inpatient appropriate because: Severity of illness  Planned Discharge Destination: Home  DVT prophylaxis.  Lovenox  Time spent: 50 minutes  This record has been created using Conservation officer, historic buildings. Errors have been sought and corrected,but may not always be located. Such creation errors do not reflect on the standard of care.   Author: Luna Salinas, MD 04/07/2024 1:14 PM  For on call review [email or web address].

## 2024-04-07 NOTE — Assessment & Plan Note (Signed)
 CBG elevated, patient is on high-dose steroid. - Increasing the dose of Semglee  to 20 units twice daily -Increasing mealtime coverage -Making SSI moderate

## 2024-04-07 NOTE — Inpatient Diabetes Management (Signed)
 Inpatient Diabetes Program Recommendations  AACE/ADA: New Consensus Statement on Inpatient Glycemic Control (2015)  Target Ranges:  Prepandial:   less than 140 mg/dL      Peak postprandial:   less than 180 mg/dL (1-2 hours)      Critically ill patients:  140 - 180 mg/dL   Lab Results  Component Value Date   GLUCAP 325 (H) 04/07/2024   HGBA1C 6.6 (H) 05/18/2023    Latest Reference Range & Units 04/06/24 20:07 04/06/24 23:25 04/07/24 08:07  Glucose-Capillary 70 - 99 mg/dL 161 (H) 096 (H) 045 (H)  (H): Data is abnormally high  Diabetes history: DM2 Outpatient Diabetes medications: Lantus  40 units daily, Novolog  6 units tid meal coverage, Glucotrol  5 mg daily, Metformin  1 gm bid Current orders for Inpatient glycemic control: Semglee  20 units daily, Novolog  3 units tid meal coverage, Novolog  0-6 units tid, 0-5 units hs, Solumedrol 1gm daily  Inpatient Diabetes Program Recommendations:   Patient may need increase to home insulin  doses while on steroids: -Increase Semglee  to 40 units daily -Increase Novolog  meal coverage to 6 units tid if eats 50%  Thank you, Marily Shows E. Tyronne Blann, RN, MSN, CDCES  Diabetes Coordinator Inpatient Glycemic Control Team Team Pager [phone] (8am-5pm) 04/07/2024 9:58 AM

## 2024-04-07 NOTE — TOC CM/SW Note (Signed)
 Transition of Care [facility]) - Inpatient Brief Assessment   Patient Details  Name: Christian Woodard MRN: [id number] Date of Birth: 1946/01/12  Transition of [facility]) CM/SW Contact:    Cyndie Dredge, LCSWA Phone Number: 04/07/2024, 7:39 AM   Clinical Narrative:  Transition of Care Department [facility]) has reviewed patient and no TOC needs have been identified at this time. We will continue to monitor patient advancement through interdisciplinary progression rounds. If new patient transition needs arise, please place a TOC consult.  Transition of Care Asessment: Insurance and Status: Insurance coverage has been reviewed Patient has primary care physician: Yes Home environment has been reviewed: Single Family Home Prior level of function:: Independent Prior/Current Home Services: No current home services Social Drivers of Health Review: SDOH reviewed no interventions necessary Readmission risk has been reviewed: Yes Transition of care needs: no transition of care needs at this time

## 2024-04-07 NOTE — ED Notes (Signed)
 Changed out pt remote in room as the one in there had really all frayed wires exposed.

## 2024-04-07 NOTE — ED Notes (Signed)
 Called to give report for pt. ICU in the middle of a passing pt at this time I was told and they are gong to call back as soon as they can.

## 2024-04-07 NOTE — Assessment & Plan Note (Signed)
 Seems like vasovagal.  Mildly elevated troponin with a flat curve which is likely due to demand ischemia.  No chest pain. -Echocardiogram ordered-pending -Orthostatic vitals -Continue to monitor

## 2024-04-07 NOTE — Assessment & Plan Note (Signed)
-   Continuing home pirfenidone  -Started on high-dose steroid for concern of pulmonary fibrosis exacerbation/progression. - Pulmonary was consulted.

## 2024-04-07 NOTE — Hospital Course (Addendum)
 Taken from H&P.   KORDELL FRANCZAK is a 78 y.o. male with medical history significant for pulmonary fibrosis, pulmonary hypertension, insulin -dependent diabetes mellitus, hypertension chronic diastolic CHF, and chronic hypoxic respiratory failure who presents with increased shortness of breath and syncope.   Patient has worsening shortness of breath for about 2 weeks, had a syncopal episode while walking to the bathroom with a brief loss of consciousness, fall and struck his head on floor.Aaron Aas  On presentation hypoxic requiring up to 15 L of oxygen, with mild tachycardia and tachypnea.  Labs with glucose of 300, creatinine 1.44, WBC 12,200, troponin 45 and BNP 416.  Procalcitonin negative. CTA negative for PE, increased subpleural opacities which can represent progression of interstitial lung disease or superimposed atypical infection.  Mild mediastinal and hilar adenopathy.  Patient was started on empiric antibiotics and IV diuresis.  Pulmonology was also consulted and they recommended to start him on high-dose steroid.  5/12: Vitals currently stable on 15 L of oxygen.  Slight worsening of leukocytosis but patient is on high-dose steroid.  Patient was using 8 L of oxygen over the past few weeks at home.  5/13: Hemodynamically stable, able to wean back to 8 L of oxygen which was his baseline.  Steroid dose was decreased to 50 mg by pulmonary as ESR was normal.  Procalcitonin remained negative so discontinuing ceftriaxone. PT is recommending SNF.

## 2024-04-07 NOTE — ED Notes (Signed)
 Pt wife states she may go home for a bit and to please notify her if any changes with pt as far as room, etc.

## 2024-04-07 NOTE — Plan of Care (Signed)
  Problem: Education: Goal: Ability to describe self-care measures that may prevent or decrease complications (Diabetes Survival Skills Education) will improve Outcome: Progressing Goal: Individualized Educational Video(s) Outcome: Progressing   Problem: Coping: Goal: Ability to adjust to condition or change in health will improve Outcome: Progressing   Problem: Fluid Volume: Goal: Ability to maintain a balanced intake and output will improve Outcome: Progressing   Problem: Health Behavior/Discharge Planning: Goal: Ability to identify and utilize available resources and services will improve Outcome: Progressing Goal: Ability to manage health-related needs will improve Outcome: Progressing   Problem: Metabolic: Goal: Ability to maintain appropriate glucose levels will improve Outcome: Progressing   Problem: Nutritional: Goal: Maintenance of adequate nutrition will improve Outcome: Progressing Goal: Progress toward achieving an optimal weight will improve Outcome: Progressing   Problem: Skin Integrity: Goal: Risk for impaired skin integrity will decrease Outcome: Progressing   Problem: Tissue Perfusion: Goal: Adequacy of tissue perfusion will improve Outcome: Progressing   Problem: Education: Goal: Knowledge of General Education information will improve Description: Including pain rating scale, medication(s)/side effects and non-pharmacologic comfort measures Outcome: Progressing   Problem: Health Behavior/Discharge Planning: Goal: Ability to manage health-related needs will improve Outcome: Progressing   Problem: Clinical Measurements: Goal: Ability to maintain clinical measurements within normal limits will improve Outcome: Progressing Goal: Will remain free from infection Outcome: Progressing Goal: Diagnostic test results will improve Outcome: Progressing Goal: Respiratory complications will improve Outcome: Progressing Goal: Cardiovascular complication will  be avoided Outcome: Progressing   Problem: Activity: Goal: Risk for activity intolerance will decrease Outcome: Progressing   Problem: Nutrition: Goal: Adequate nutrition will be maintained Outcome: Progressing   Problem: Coping: Goal: Level of anxiety will decrease Outcome: Progressing   Problem: Elimination: Goal: Will not experience complications related to bowel motility Outcome: Progressing Goal: Will not experience complications related to urinary retention Outcome: Progressing   Problem: Pain Managment: Goal: General experience of comfort will improve and/or be controlled Outcome: Progressing   Problem: Safety: Goal: Ability to remain free from injury will improve Outcome: Progressing   Problem: Skin Integrity: Goal: Risk for impaired skin integrity will decrease Outcome: Progressing   Problem: Education: Goal: Ability to describe self-care measures that may prevent or decrease complications (Diabetes Survival Skills Education) will improve Outcome: Progressing Goal: Individualized Educational Video(s) Outcome: Progressing   Problem: Coping: Goal: Ability to adjust to condition or change in health will improve Outcome: Progressing   Problem: Fluid Volume: Goal: Ability to maintain a balanced intake and output will improve Outcome: Progressing   Problem: Health Behavior/Discharge Planning: Goal: Ability to identify and utilize available resources and services will improve Outcome: Progressing Goal: Ability to manage health-related needs will improve Outcome: Progressing   Problem: Metabolic: Goal: Ability to maintain appropriate glucose levels will improve Outcome: Progressing   Problem: Nutritional: Goal: Maintenance of adequate nutrition will improve Outcome: Progressing Goal: Progress toward achieving an optimal weight will improve Outcome: Progressing   Problem: Skin Integrity: Goal: Risk for impaired skin integrity will decrease Outcome:  Progressing   Problem: Tissue Perfusion: Goal: Adequacy of tissue perfusion will improve Outcome: Progressing

## 2024-04-07 NOTE — Consult Note (Signed)
 TELE-PCCM CONSULT This consult was provided via telemedicine with 2-way video and audio communication.  NAME:  Christian Woodard, MRN:  [id number], DOB:  [birth year], LOS: 1 ADMISSION DATE:  04/06/2024, CONSULTATION DATE:  04/07/24 REFERRING MD:  TRH, CHIEF COMPLAINT:  SOB   History of Present Illness:  78 year old man w/ hx of pulmonary fibrosis, pulm HTN, DM2, HFpEF p/w worsening SOB x 2 weeks and recent syncope.  Workup neg for PE but does show some acute on chronic changes on CT.  Pulm consulted to review case.  Patient started on steroids, lasix , CAP coverage.  Breathing is stable. No infectious symptoms, no orthopnea or leg swelling, no sick contacts.  3L baseline now 15L  Pertinent  Medical History   Past Medical History:  Diagnosis Date   Arthritis    Barrett's esophagus    With low-grade dysplasia, following with [facility]   Diabetes mellitus    GERD (gastroesophageal reflux disease)    Hypercholesteremia      Significant Hospital Events: Including procedures, antibiotic start and stop dates in addition to other pertinent events   5/11 admit  Interim History / Subjective:  Consult  Objective    Blood pressure 111/60, pulse 92, temperature 98 F (36.7 C), temperature source Oral, resp. rate (!) 21, height 5\' 6"  (1.676 m), weight 74.6 kg, SpO2 97%.        Intake/Output Summary (Last 24 hours) at 04/07/2024 1636 Last data filed at 04/07/2024 6433 Gross per 24 hour  Intake 3 ml  Output 1400 ml  Net -1397 ml   Filed Weights   04/06/24 1750 04/07/24 1238  Weight: 78.5 kg 74.6 kg    Examination:  Seen on video R scalp/face ecchymoses Nonlabored breathing pattern Aox3  Labs, imaging reviewed  Assessment & Plan:  Acute on chronic hypoxemic respiratory failure- secondary to acute on chronic cor pulmonale (most likely).  Less likely CAP or AE-ILD.  Check ESR, Pct.  DC steroids if ESR normal. DC abx if Pct < 0.3.  Continue PTA tyvaso, perfenidone.  Will need to  keep track of his weight at home so we can increase lasix  prior to fluid becoming an issue.  If persistent issues wonder if RHC while on tyvaso may be helpful vs RHC with iNO challenge to make sure no PVOD and that LV can handle increased pre-load.  Will check on him tomorrow.  Dr. Bertrum Brodie updated  Best Practice (right click and "Reselect all SmartList Selections" daily)  Per primary  Labs   CBC: Recent Labs  Lab 04/06/24 1802 04/07/24 0638  WBC 12.2* 14.8*  NEUTROABS 10.2*  --   HGB 13.9 13.9  HCT 42.3 41.4  MCV 92.2 91.4  PLT 309 309    Basic Metabolic Panel: Recent Labs  Lab 04/06/24 1802 04/07/24 0638  NA 133* 136  K 4.3 5.1  CL 103 98  CO2 21* 26  GLUCOSE 300* 310*  BUN 22 23  CREATININE 1.44* 1.38*  CALCIUM  8.5* 9.2  MG 2.0  --    GFR: Estimated Creatinine Clearance: 39.8 mL/min (A) (by C-G formula based on SCr of 1.38 mg/dL (H)). Recent Labs  Lab 04/06/24 1802 04/06/24 1956 04/07/24 0638  PROCALCITON  --  <0.10 0.10  WBC 12.2*  --  14.8*    Liver Function Tests: Recent Labs  Lab 04/06/24 1802  AST 36  ALT 23  ALKPHOS 80  BILITOT 1.0  PROT 6.7  ALBUMIN 3.7   No results for input(s): "LIPASE", "AMYLASE"  in the last 168 hours. No results for input(s): "AMMONIA" in the last 168 hours.  ABG    Component Value Date/Time   PHART 7.4 05/18/2023 1711   PCO2ART 35 05/18/2023 1711   PO2ART 66 (L) 05/18/2023 1711   HCO3 28.8 (H) 04/06/2024 2139   TCO2 27 12/17/2023 1247   ACIDBASEDEF 2.7 (H) 05/18/2023 1711   O2SAT 59.1 04/06/2024 2139     Coagulation Profile: No results for input(s): "INR", "PROTIME" in the last 168 hours.  Cardiac Enzymes: No results for input(s): "CKTOTAL", "CKMB", "CKMBINDEX", "TROPONINI" in the last 168 hours.  HbA1C: Hgb A1c MFr Bld  Date/Time Value Ref Range Status  05/18/2023 04:01 PM 6.6 (H) 4.8 - 5.6 % Final    Comment:    (NOTE) Pre diabetes:          5.7%-6.4%  Diabetes:              >6.4%  Glycemic  control for   <7.0% adults with diabetes   01/12/2022 10:44 AM 7.9 (H) 4.8 - 5.6 % Final    Comment:             Prediabetes: 5.7 - 6.4          Diabetes: >6.4          Glycemic control for adults with diabetes: <7.0     CBG: Recent Labs  Lab 04/06/24 2007 04/06/24 2325 04/07/24 0807 04/07/24 1239 04/07/24 1601  GLUCAP 256* 263* 325* 335* 356*    Review of Systems:    Positive Symptoms in bold:  Constitutional fevers, chills, weight loss, fatigue, anorexia, malaise  Eyes decreased vision, double vision, eye irritation  Ears, Nose, Mouth, Throat sore throat, trouble swallowing, sinus congestion  Cardiovascular chest pain, paroxysmal nocturnal dyspnea, lower ext edema, palpitations   Respiratory SOB, cough, DOE, hemoptysis, wheezing  Gastrointestinal nausea, vomiting, diarrhea  Genitourinary burning with urination, trouble urinating  Musculoskeletal joint aches, joint swelling, back pain  Integumentary  rashes, skin lesions  Neurological focal weakness, focal numbness, trouble speaking, headaches  Psychiatric depression, anxiety, confusion  Endocrine polyuria, polydipsia, cold intolerance, heat intolerance  Hematologic abnormal bruising, abnormal bleeding, unexplained nose bleeds  Allergic/Immunologic recurrent infections, hives, swollen lymph nodes     Past Medical History:  He,  has a past medical history of Arthritis, Barrett's esophagus, Diabetes mellitus, GERD (gastroesophageal reflux disease), and Hypercholesteremia.   Surgical History:   Past Surgical History:  Procedure Laterality Date   BACK SURGERY  2000   CHOLECYSTECTOMY     COLONOSCOPY  09/15/2011   Dr. Riley Cheadle: Suboptimal prep. Pancolonic diverticulosis. Tubular adenomas. Next colonoscopy 5 years.   COLONOSCOPY N/A 09/22/2016   Surgeon: Suzette Espy, MD; pancolonic diverticulosis, otherwise normal exam.  Recommended repeat in 5 years.   COLONOSCOPY WITH PROPOFOL  N/A 09/29/2021   Procedure:  COLONOSCOPY WITH PROPOFOL ;  Surgeon: Suzette Espy, MD;  [facility];  Service: Endoscopy;  Laterality: N/A;  9:00am   ESOPHAGOGASTRODUODENOSCOPY N/A 08/31/2017   Surgeon: Suzette Espy, MD;  Barrett's esophagus with low-grade dysplasia, small hiatal hernia with abnormal gastric mucosa biopsied (no H. pylori), normal examined duodenum.     ESOPHAGOGASTRODUODENOSCOPY  11/2018   [facility]; Barrett's esophagus, negative for dysplasia   ESOPHAGOGASTRODUODENOSCOPY  01/2020   [facility]; Barrett's esophagus, indefinite for focal low-grade dysplasia.   GIVENS CAPSULE STUDY N/A 10/26/2017   Surgeon: Suzette Espy, MD; localized clustered areas of white "blunted appearing" abnormal small bowel mucosa/villi of uncertain significance.  Follow-up CTE  and small bowel enteroscopy at [facility] without significant findings.    GIVENS CAPSULE STUDY N/A 05/15/2023   Procedure: GIVENS CAPSULE STUDY;  Surgeon: Suzette Espy, MD;  Location: [facility];  Service: Endoscopy;  Laterality: N/A;  730am   HERNIA REPAIR     as a child   left cornea transplant Left    POLYPECTOMY  09/29/2021   Procedure: POLYPECTOMY;  Surgeon: Suzette Espy, MD;  Location: [facility];  Service: Endoscopy;;   Right cornea transplant     RIGHT HEART CATH N/A 12/17/2023   Procedure: RIGHT HEART CATH;  Surgeon: Darlis Eisenmenger, MD;  Location: [facility] INVASIVE CV LAB;  Service: Cardiovascular;  Laterality: N/A;   SMALL BOWEL ENTEROSCOPY  11/2017   [facility]; Barrett's esophagus focally positive for low-grade dysplasia, normal stomach, normal duodenum, normal jejunum.  Small bowel biopsies were benign.   TONSILLECTOMY       Social History:   reports that he quit smoking about 25 years ago. His smoking use included cigarettes. He started smoking about 61 years ago. He has a 72 pack-year smoking history. He has never used smokeless tobacco. He reports that he does not drink alcohol and does not use drugs.   Family History:  His family history  includes Emphysema in his father; Heart disease in his father and mother; Lung cancer in his father. There is no history of Colon cancer.   Allergies No Known Allergies   Home Medications  Prior to Admission medications   Medication Sig Start Date End Date Taking? Authorizing Provider  acetaminophen  (TYLENOL ) 500 MG tablet Take 500 mg by mouth every 6 (six) hours as needed (headaches/pain.).   Yes [provider]  aspirin  EC 81 MG tablet Take 81 mg by mouth every evening. Swallow whole.   Yes [provider]  atorvastatin  (LIPITOR) 10 MG tablet Take 10 mg by mouth at bedtime.   Yes [provider]  cyanocobalamin  (VITAMIN B12) 1000 MCG tablet Take 1,000 mcg by mouth daily.   Yes [provider]  famotidine  (PEPCID ) 10 MG tablet Take 10 mg by mouth every evening.   Yes [provider]  Ferrous Gluconate  (IRON  27 PO) Take 1 tablet by mouth daily.   Yes [provider]  furosemide  (LASIX ) 20 MG tablet Take 1 tablet (20 mg total) by mouth daily. 05/19/23 05/18/24 Yes [name], MD  gabapentin  (NEURONTIN ) 300 MG capsule Take 1 capsule (300 mg total) by mouth 2 (two) times daily. 05/19/23  Yes [name], MD  glipiZIDE  (GLUCOTROL  XL) 5 MG 24 hr tablet Take 5 mg by mouth daily with breakfast.   Yes [provider]  IYUZEH 0.005 % SOLN Apply 1 drop to eye at bedtime. 03/31/24  Yes [provider]  LANTUS  SOLOSTAR 100 UNIT/ML Solostar Pen Inject 40 Units into the skin in the morning. 08/23/16  Yes [provider]  losartan  (COZAAR ) 25 MG tablet Take 25 mg by mouth daily. 03/04/24  Yes [provider]  metFORMIN  (GLUCOPHAGE ) 500 MG tablet Take 500 mg by mouth 2 (two) times daily. 03/04/24  Yes [provider]  NOVOLOG  FLEXPEN 100 UNIT/ML FlexPen Inject 6 Units into the skin 2 (two) times daily with a meal. 02/05/23  Yes [provider]  pantoprazole  (PROTONIX ) 40 MG tablet TAKE 1 TABLET BY MOUTH ONE  TIME DAILY 30-60 MINUTES BEFORE BREAKFAST 05/22/23  Yes [name], PA-C  Pirfenidone  267 MG TABS Take 2 tablets (534 mg total) by mouth  in the morning, at noon, and at bedtime. **low dose as maintenance** 01/11/24  Yes Ramaswamy, Nehemiah Balling, MD  potassium chloride  (MICRO-K ) 10 MEQ CR capsule Take 10 mEq by mouth daily. 10/24/23  Yes [provider]  TYVASO DPI MAINTENANCE KIT 64 MCG POWD Inhale into the lungs. 03/26/24  Yes [provider]     Total time: 30 mins     Josiah Nigh, MD  04/07/24 4:36 PM [facility]  For contact information, see Amion. If no response to pager, please call PCCM consult pager. After hours, 7PM- 7AM, please call Elink.

## 2024-04-07 NOTE — Assessment & Plan Note (Signed)
-   Continue Treprostinil

## 2024-04-07 NOTE — Assessment & Plan Note (Signed)
 Creatinine with some improvement at 1.38 today. Seems stable -Monitor renal function while patient is being diuresed -Avoid nephrotoxins

## 2024-04-07 NOTE — Progress Notes (Signed)
*  PRELIMINARY RESULTS* Echocardiogram 2D Echocardiogram has been performed with Definity .  Bernis Brisker 04/07/2024, 2:30 PM

## 2024-04-08 DIAGNOSIS — J9621 Acute and chronic respiratory failure with hypoxia: Secondary | ICD-10-CM | POA: Diagnosis not present

## 2024-04-08 DIAGNOSIS — J8410 Pulmonary fibrosis, unspecified: Secondary | ICD-10-CM | POA: Diagnosis not present

## 2024-04-08 DIAGNOSIS — I5033 Acute on chronic diastolic (congestive) heart failure: Secondary | ICD-10-CM | POA: Diagnosis not present

## 2024-04-08 DIAGNOSIS — R55 Syncope and collapse: Secondary | ICD-10-CM | POA: Diagnosis not present

## 2024-04-08 LAB — BASIC METABOLIC PANEL WITH GFR
Anion gap: 9 (ref 5–15)
BUN: 31 mg/dL — ABNORMAL HIGH (ref 8–23)
CO2: 30 mmol/L (ref 22–32)
Calcium: 8.9 mg/dL (ref 8.9–10.3)
Chloride: 99 mmol/L (ref 98–111)
Creatinine, Ser: 1.21 mg/dL (ref 0.61–1.24)
GFR, Estimated: >60 mL/min (ref >60)
Glucose, Bld: 201 mg/dL — ABNORMAL HIGH (ref 70–99)
Potassium: 4 mmol/L (ref 3.5–5.1)
Sodium: 138 mmol/L (ref 135–145)

## 2024-04-08 LAB — CBC
HCT: 42.2 % (ref 39.0–52.0)
Hemoglobin: 14 g/dL (ref 13.0–17.0)
MCH: 29.7 pg (ref 26.0–34.0)
MCHC: 33.2 g/dL (ref 30.0–36.0)
MCV: 89.4 fL (ref 80.0–100.0)
Platelets: 335 10*3/uL (ref 150–400)
RBC: 4.72 MIL/uL (ref 4.22–5.81)
RDW: 15 % (ref 11.5–15.5)
WBC: 27.7 10*3/uL — ABNORMAL HIGH (ref 4.0–10.5)
nRBC: 0 % (ref 0.0–0.2)

## 2024-04-08 LAB — GLUCOSE, CAPILLARY
Glucose-Capillary: 224 mg/dL — ABNORMAL HIGH (ref 70–99)
Glucose-Capillary: 266 mg/dL — ABNORMAL HIGH (ref 70–99)
Glucose-Capillary: 221 mg/dL — ABNORMAL HIGH (ref 70–99)
Glucose-Capillary: 177 mg/dL — ABNORMAL HIGH (ref 70–99)

## 2024-04-08 LAB — SEDIMENTATION RATE: Sed Rate: 10 mm/h (ref 0–16)

## 2024-04-08 MED ORDER — CHLORHEXIDINE GLUCONATE CLOTH 2 % EX PADS
6.0000 | MEDICATED_PAD | Freq: Every day | CUTANEOUS | Status: DC
  Administered 2024-04-08 – 2024-04-10 (×3): 6 via TOPICAL

## 2024-04-08 NOTE — Plan of Care (Signed)
  Problem: Acute Rehab PT Goals(only PT should resolve) Goal: Pt Will Go Supine/Side To Sit Outcome: Progressing Flowsheets (Taken 04/08/2024 1228) Pt will go Supine/Side to Sit: with supervision Goal: Patient Will Transfer Sit To/From Stand Outcome: Progressing Flowsheets (Taken 04/08/2024 1228) Patient will transfer sit to/from stand: with supervision Goal: Pt Will Transfer Bed To Chair/Chair To Bed Outcome: Progressing Flowsheets (Taken 04/08/2024 1228) Pt will Transfer Bed to Chair/Chair to Bed: with supervision Goal: Pt Will Ambulate Outcome: Progressing Flowsheets (Taken 04/08/2024 1228) Pt will Ambulate:  50 feet  with supervision  with rolling walker  with contact guard assist   12:29 PM, 04/08/24 Walton Guppy, MPT Physical Therapist with [facility] [phone] office [phone] mobile phone

## 2024-04-08 NOTE — Assessment & Plan Note (Signed)
 Creatinine with some improvement at 1.21 today. Seems stable -Monitor renal function while patient is being diuresed -Avoid nephrotoxins

## 2024-04-08 NOTE — Progress Notes (Signed)
 Progress Note   Patient: Christian Woodard AOZ:[id number] DOB: 03-Apr-1946 DOA: 04/06/2024     2 DOS: the patient was seen and examined on 04/08/2024   Brief hospital course: Taken from H&P.   Christian Woodard is a 78 y.o. male with medical history significant for pulmonary fibrosis, pulmonary hypertension, insulin -dependent diabetes mellitus, hypertension chronic diastolic CHF, and chronic hypoxic respiratory failure who presents with increased shortness of breath and syncope.   Patient has worsening shortness of breath for about 2 weeks, had a syncopal episode while walking to the bathroom with a brief loss of consciousness, fall and struck his head on floor.Christian Woodard  On presentation hypoxic requiring up to 15 L of oxygen, with mild tachycardia and tachypnea.  Labs with glucose of 300, creatinine 1.44, WBC 12,200, troponin 45 and BNP 416.  Procalcitonin negative. CTA negative for PE, increased subpleural opacities which can represent progression of interstitial lung disease or superimposed atypical infection.  Mild mediastinal and hilar adenopathy.  Patient was started on empiric antibiotics and IV diuresis.  Pulmonology was also consulted and they recommended to start him on high-dose steroid.  5/12: Vitals currently stable on 15 L of oxygen.  Slight worsening of leukocytosis but patient is on high-dose steroid.  Patient was using 8 L of oxygen over the past few weeks at home.  5/13: Hemodynamically stable, able to wean back to 8 L of oxygen which was his baseline.  Steroid dose was decreased to 50 mg by pulmonary as ESR was normal.  Procalcitonin remained negative so discontinuing ceftriaxone. PT is recommending SNF.  Assessment and Plan: * Acute on chronic respiratory failure with hypoxia (HCC) Now able to wean back to 8 L which was his most recent baseline. CTA chest was negative for PE but did show some concern of progression of pulmonary via fibrosis versus superimposed atypical infection  but procalcitonin is negative. ESR normal -Steroid dose was decreased - Discontinue ceftriaxone and continue Zithromax -Pulmonology is on board -Follow recommendations from pulmonary -Continue with supportive care and supplemental oxygen-  Syncope Seems like vasovagal.  Mildly elevated troponin with a flat curve which is likely due to demand ischemia.  No chest pain. -Echocardiogram with hyperdynamic EF and consistent with pulmonary hypertension -Orthostatic vitals -Continue to monitor  Acute on chronic diastolic CHF (congestive heart failure) (HCC) Patient had elevated BNP at 416 on admission.  Can be a confounder with severe pulmonary hypertension and CKD stage IIIa.  Echocardiogram consistent with volume overload and pulmonary hypertension, hyperdynamic EF -Continue with IV Lasix  -Daily weight and BMP -Strict taken output  Pulmonary fibrosis (HCC) - Continuing home pirfenidone  -Started on high-dose steroid for concern of pulmonary fibrosis exacerbation/progression. - Pulmonary was consulted.  Pulmonary hypertension (HCC) - Continue Treprostinil   Type 2 diabetes mellitus without complication (HCC) CBG elevated, patient is on high-dose steroid. - Increasing the dose of Semglee  to 20 units twice daily -Increasing mealtime coverage -Making SSI moderate  CKD stage 3a, GFR 45-59 ml/min (HCC) Creatinine with some improvement at 1.21 today. Seems stable -Monitor renal function while patient is being diuresed -Avoid nephrotoxins   Subjective: Patient was resting in recliner when seen today.  Feeling much improved.  Physical Exam: Vitals:   04/08/24 1300 04/08/24 1303 04/08/24 1308 04/08/24 1309  BP: 112/71     Pulse: 97     Resp: 20     Temp:      TempSrc:      SpO2:  91% 96% 95%  Weight:  Height:       General.  Frail elderly man, in no acute distress. Pulmonary.  Scattered dry crackles bilaterally, normal respiratory effort. CV.  Regular rate and rhythm, no  JVD, rub or murmur. Abdomen.  Soft, nontender, nondistended, BS positive. CNS.  Alert and oriented .  No focal neurologic deficit. Extremities.  No edema, no cyanosis, pulses intact and symmetrical.  Data Reviewed: Prior data reviewed  Family Communication: Discussed with wife and granddaughter at bedside  Disposition: Status is: Inpatient Remains inpatient appropriate because: Severity of illness  Planned Discharge Destination: SNF  DVT prophylaxis.  Lovenox  Time spent: 50 minutes  This record has been created using Conservation officer, historic buildings. Errors have been sought and corrected,but may not always be located. Such creation errors do not reflect on the standard of care.   Author: Luna Salinas, MD 04/08/2024 2:50 PM  For on call review [email or web address].

## 2024-04-08 NOTE — TOC Initial Note (Signed)
 Transition of Care [facility]) - Initial/Assessment Note    Patient Details  Name: Christian Woodard MRN: [id number] Date of Birth: [birth year]  [facility] CM/SW Contact:    Cyndie Dredge, LCSWA Phone Number: 04/08/2024, 1:54 PM  Clinical Narrative:                  Writer spoke with family who is at bedside regarding PT recommendation for SNF. Patient shared with PT that he wanted to go home. Family agreeable to Clinical research associate sending patient information out to local SNF's but will still speak with entire family to make sure SNF is the route they want to take. Referral has been sent out. TOC to follow.   Expected Discharge Plan: Skilled Nursing Facility Barriers to Discharge: Continued Medical Work up   Patient Goals and CMS Choice Patient states their goals for this hospitalization and ongoing recovery are:: DC home CMS Medicare.gov Compare Post Acute Care list provided to:: Patient Represenative (must comment) Mylinda Asa Grandduaghter and spouse Devra Fontana)   [place] ownership interest in [facility]provided to:: Spouse    Expected Discharge Plan and Services In-house Referral: Clinical Social Work Discharge Planning Services: CM Consult Post Acute Care Choice: Durable Medical Equipment Living arrangements for the past 2 months: Single Family Home                   DME Agency: [employer]                  Prior Living Arrangements/Services Living arrangements for the past 2 months: Single Family Home Lives with:: Spouse Patient language and need for interpreter reviewed:: Yes Do you feel safe going back to the place where you live?: Yes      Need for Family Participation in Patient Care: Yes (Comment) Care giver support system in place?: Yes (comment) Current home services: DME    Activities of Daily Living   ADL Screening (condition at time of admission) Independently performs ADLs?: No Does the patient have a NEW difficulty with  bathing/dressing/toileting/self-feeding that is expected to last >3 days?: Yes (Initiates electronic notice to provider for possible OT consult) (due to shob) Does the patient have a NEW difficulty with getting in/out of bed, walking, or climbing stairs that is expected to last >3 days?: Yes (Initiates electronic notice to provider for possible PT consult) Does the patient have a NEW difficulty with communication that is expected to last >3 days?: No Is the patient deaf or have difficulty hearing?: No Does the patient have difficulty seeing, even when wearing glasses/contacts?: No Does the patient have difficulty concentrating, remembering, or making decisions?: No  Permission Sought/Granted      Share Information with NAME: Devra Fontana and Mylinda Asa     Permission granted to share info w Relationship: Spouse and Granddaughter     Emotional Assessment Appearance:: Appears stated age     Orientation: : Oriented to Self, Oriented to Place, Oriented to  Time, Oriented to Situation Alcohol / Substance Use: Not Applicable Psych Involvement: No (comment)  Admission diagnosis:  Acute on chronic respiratory failure with hypoxia (HCC) [J96.21] Acute on chronic hypoxic respiratory failure (HCC) [J96.21] Patient Active Problem List   Diagnosis Date Noted   Acute on chronic respiratory failure with hypoxia (HCC) 04/06/2024   Syncope 04/06/2024   CKD stage 3a, GFR 45-59 ml/min (HCC) 04/06/2024   Pulmonary hypertension (HCC) 04/06/2024   Acute on chronic diastolic CHF (congestive heart failure) (HCC) 04/06/2024   Sinus tachycardia 09/21/2023  Pulmonary fibrosis (HCC) 07/30/2023   Chronic respiratory failure with hypoxia (HCC) 06/30/2023   Hypoxia 05/18/2023   Solitary pulmonary nodule on lung CT 05/11/2022   Diastasis recti 01/10/2022   Dysphagia 06/22/2021   Barrett's esophagus with low grade dysplasia 03/18/2021   Shortness of breath 03/18/2021   Gastroesophageal reflux disease 03/18/2021    Constipation 03/18/2021   Iron  deficiency anemia 03/18/2021   Upper airway cough syndrome 02/18/2021   Acute respiratory failure with hypoxia (HCC) 10/03/2019   Acute respiratory disease due to COVID-19 virus 10/03/2019   Essential hypertension 10/03/2019   Type 2 diabetes mellitus without complication (HCC)    Heme positive stool 08/30/2017   Absolute anemia 06/15/2017   Dyspnea on exertion 06/14/2017   Abdominal bloating 09/04/2016   Hx of adenomatous colonic polyps 09/04/2016   PCP:  Artemisa Bile, MD Pharmacy:   [facility] [address] [place] - [address] [place] [address] Phone: [phone] Fax: [phone]  [facility] - [place], [place] - [address] [place] [address] Phone: [phone] Fax: [phone]  [place] [facility] [address] [place] [address] Phone: [phone] Fax: [phone]     Social Drivers of Health (SDOH) Social History: SDOH Screenings   Food Insecurity: No Food Insecurity (04/07/2024)  Housing: Low Risk  (04/07/2024)  Transportation Needs: No Transportation Needs (04/07/2024)  Utilities: Not At Risk (04/07/2024)  Social Connections: Socially Integrated (04/07/2024)  Tobacco Use: Medium Risk (04/06/2024)   SDOH Interventions:     Readmission Risk Interventions    04/08/2024    1:51 PM 04/07/2024    7:38 AM  Readmission Risk Prevention Plan  Transportation Screening Complete Complete  Home Care Screening Complete Complete  Medication Review (RN CM) Complete Complete

## 2024-04-08 NOTE — NC FL2 (Signed)
 [place]  MEDICAID FL2 LEVEL OF CARE FORM     IDENTIFICATION  Patient Name: Christian Woodard Birthdate: February 08, 1946 Sex: male Admission Date (Current Location): 04/06/2024  [facility] and [place] Number:  [employer] and Address:  [facility],  [address], [place] [address]      Provider Number: [id number]  Attending Physician Name and Address:  Luna Salinas, MD  Relative Name and Phone Number:  Arsalan Chenault [phone]    Current Level of Care: Hospital Recommended Level of Care: Skilled Nursing Facility Prior Approval Number:    Date Approved/Denied:   PASRR Number: Pending  Discharge Plan: SNF    Current Diagnoses: Patient Active Problem List   Diagnosis Date Noted   Acute on chronic respiratory failure with hypoxia (HCC) 04/06/2024   Syncope 04/06/2024   CKD stage 3a, GFR 45-59 ml/min (HCC) 04/06/2024   Pulmonary hypertension (HCC) 04/06/2024   Acute on chronic diastolic CHF (congestive heart failure) (HCC) 04/06/2024   Sinus tachycardia 09/21/2023   Pulmonary fibrosis (HCC) 07/30/2023   Chronic respiratory failure with hypoxia (HCC) 06/30/2023   Hypoxia 05/18/2023   Solitary pulmonary nodule on lung CT 05/11/2022   Diastasis recti 01/10/2022   Dysphagia 06/22/2021   Barrett's esophagus with low grade dysplasia 03/18/2021   Shortness of breath 03/18/2021   Gastroesophageal reflux disease 03/18/2021   Constipation 03/18/2021   Iron  deficiency anemia 03/18/2021   Upper airway cough syndrome 02/18/2021   Acute respiratory failure with hypoxia (HCC) 10/03/2019   Acute respiratory disease due to COVID-19 virus 10/03/2019   Essential hypertension 10/03/2019   Type 2 diabetes mellitus without complication (HCC)    Heme positive stool 08/30/2017   Absolute anemia 06/15/2017   Dyspnea on exertion 06/14/2017   Abdominal bloating 09/04/2016   Hx of adenomatous colonic polyps 09/04/2016    Orientation RESPIRATION BLADDER Height  & Weight     Self, Time, Place, Situation  Normal Continent Weight: 164 lb 6 oz (74.6 kg) Height:  5\' 6"  (167.6 cm)  BEHAVIORAL SYMPTOMS/MOOD NEUROLOGICAL BOWEL NUTRITION STATUS      Continent Diet (See DC summary)  AMBULATORY STATUS COMMUNICATION OF NEEDS Skin   Extensive Assist Verbally Normal                       Personal Care Assistance Level of Assistance  Bathing, Feeding, Dressing Bathing Assistance: Limited assistance Feeding assistance: Independent Dressing Assistance: Limited assistance     Functional Limitations Info  Sight, Hearing, Speech Sight Info: Adequate Hearing Info: Adequate Speech Info: Adequate    SPECIAL CARE FACTORS FREQUENCY  PT (By licensed PT), OT (By licensed OT)     PT Frequency: 5 x a week OT Frequency: 5 x a week            Contractures Contractures Info: Not present    Additional Factors Info  Code Status, Allergies, Insulin  Sliding Scale Code Status Info: FULL Allergies Info: NKA   Insulin  Sliding Scale Info: Insulin  Apart NovoLOG  Injections 0-5 units ; CBG < 70: Implement Hypoglycemia Standing Orders and refer to Hypoglycemia Standing Orders sidebar report  CBG 70 - 120: 0 units  CBG 121 - 150: 0 units  CBG 151 - 200: 0 units  CBG 201 - 250: 2 units  CBG 251 - 300: 3 units  CBG 301 - 350: 4 units  CBG 351 - 400: 5 units  CBG > 400 call MD and obtain STAT lab verification  Current Medications (04/08/2024):  This is the current hospital active medication list Current Facility-Administered Medications  Medication Dose Route Frequency Provider Last Rate Last Admin   acetaminophen  (TYLENOL ) tablet 650 mg  650 mg Oral Q6H PRN Opyd, Timothy S, MD       Or   acetaminophen  (TYLENOL ) suppository 650 mg  650 mg Rectal Q6H PRN Opyd, Timothy S, MD       aspirin  EC tablet 81 mg  81 mg Oral QPM Opyd, Timothy S, MD   81 mg at 04/07/24 1726   atorvastatin  (LIPITOR) tablet 10 mg  10 mg Oral QHS Opyd, Timothy S, MD   10 mg at 04/07/24 2137    azithromycin (ZITHROMAX) 500 mg in sodium chloride  0.9 % 250 mL IVPB  500 mg Intravenous Q24H Opyd, Timothy S, MD   Stopped at 04/08/24 0053   Chlorhexidine Gluconate Cloth 2 % PADS 6 each  6 each Topical Q0600 Amin, Sumayya, MD   6 each at 04/08/24 0913   cyanocobalamin  (VITAMIN B12) tablet 1,000 mcg  1,000 mcg Oral Daily Amin, Sumayya, MD   1,000 mcg at 04/08/24 0912   enoxaparin  (LOVENOX ) injection 40 mg  40 mg Subcutaneous Q24H Opyd, Timothy S, MD   40 mg at 04/08/24 0913   furosemide  (LASIX ) injection 40 mg  40 mg Intravenous Q12H Opyd, Timothy S, MD   40 mg at 04/08/24 1133   gabapentin  (NEURONTIN ) capsule 300 mg  300 mg Oral BID Opyd, Timothy S, MD   300 mg at 04/08/24 0912   insulin  aspart (novoLOG ) injection 0-15 Units  0-15 Units Subcutaneous TID WC Amin, Sumayya, MD   8 Units at 04/08/24 1228   insulin  aspart (novoLOG ) injection 0-5 Units  0-5 Units Subcutaneous QHS Opyd, Timothy S, MD   4 Units at 04/07/24 2140   insulin  aspart (novoLOG ) injection 6 Units  6 Units Subcutaneous TID WC Amin, Sumayya, MD   6 Units at 04/08/24 1228   insulin  glargine-yfgn (SEMGLEE ) injection 20 Units  20 Units Subcutaneous BID Amin, Sumayya, MD   20 Units at 04/08/24 0917   ipratropium-albuterol  (DUONEB) 0.5-2.5 (3) MG/3ML nebulizer solution 3 mL  3 mL Nebulization Q4H PRN Opyd, Timothy S, MD       latanoprost (XALATAN) 0.005 % ophthalmic solution 1 drop  1 drop Both Eyes QHS Amin, Sumayya, MD   1 drop at 04/07/24 2142   losartan  (COZAAR ) tablet 25 mg  25 mg Oral Daily Amin, Sumayya, MD   25 mg at 04/08/24 0912   methylPREDNISolone sodium succinate (SOLU-MEDROL) 40 mg/mL injection 40 mg  40 mg Intravenous Q24H Josiah Nigh, MD   40 mg at 04/08/24 8295   pantoprazole  (PROTONIX ) EC tablet 40 mg  40 mg Oral Daily Opyd, Timothy S, MD   40 mg at 04/08/24 6213   Pirfenidone  TABS 534 mg  534 mg Oral TID Opyd, Timothy S, MD   534 mg at 04/08/24 0914   senna-docusate (Senokot-S) tablet 1 tablet  1 tablet Oral  QHS PRN Opyd, Timothy S, MD       sodium chloride  flush (NS) 0.9 % injection 3 mL  3 mL Intravenous Q12H Opyd, Timothy S, MD   3 mL at 04/08/24 0918   Treprostinil POWD 1 puff  1 puff Inhalation QID Amin, Sumayya, MD   1 puff at 04/08/24 1232     Discharge Medications: Please see discharge summary for a list of discharge medications.  Relevant Imaging Results:  Relevant Lab Results:  Additional Information SSN: [id number]  Cyndie Dredge, LCSWA

## 2024-04-08 NOTE — Evaluation (Signed)
 Physical Therapy Evaluation Patient Details Name: Christian Woodard MRN: [id number] DOB: 05-Apr-1946 Today's Date: 04/08/2024  History of Present Illness  Christian Woodard is a 78 y.o. male with medical history significant for pulmonary fibrosis, pulmonary hypertension, insulin -dependent diabetes mellitus, hypertension chronic diastolic CHF, and chronic hypoxic respiratory failure who presents with increased shortness of breath and syncope.     Patient reports roughly 2 weeks of increased dyspnea.  His chronic cough has not changed much and he has not noticed any leg swelling.  He has had some rhinorrhea but attributes this to recently increasing his supplemental oxygen.  He became lightheaded today while trying to make it to the bathroom, began to fall, but was assisted to the commode by his wife.  Immediately after sitting on the toilet, he suffered a brief loss of consciousness, fell forward, and struck his head on the floor.  He quickly regained consciousness, has a headache, but no new focal numbness or weakness.   Clinical Impression  Patient unsteady on feet and had to lean on armrest of chair for support during transfers, required use of RW for safety and able to ambulate in room with slow labored unsteady cadence, limited mostly due to fatigue, weakness and difficulty breathing with SpO2 dropping from 94% to 87% during ambulation while on 7 LPM O2. Patient tolerated sitting up in chair after therapy with SpO2 increasing to 95% while on 7 LPM O2. Patient will benefit from continued skilled physical therapy in hospital and recommended venue below to increase strength, balance, endurance for safe ADLs and gait.          If plan is discharge home, recommend the following: A lot of help with bathing/dressing/bathroom;Help with stairs or ramp for entrance;Assistance with cooking/housework;A lot of help with walking and/or transfers   Can travel by private vehicle   Yes    Equipment  Recommendations Rolling walker (2 wheels);BSC/3in1  Recommendations for Other Services       Functional Status Assessment Patient has had a recent decline in their functional status and demonstrates the ability to make significant improvements in function in a reasonable and predictable amount of time.     Precautions / Restrictions Precautions Precautions: Fall Recall of Precautions/Restrictions: Intact Restrictions Weight Bearing Restrictions Per Provider Order: No      Mobility  Bed Mobility Overal bed mobility: Needs Assistance Bed Mobility: Supine to Sit     Supine to sit: Supervision, Contact guard     General bed mobility comments: slightly labored movement    Transfers Overall transfer level: Needs assistance Equipment used: None, Rolling walker (2 wheels) Transfers: Sit to/from Stand, Bed to chair/wheelchair/BSC Sit to Stand: Min assist   Step pivot transfers: Min assist       General transfer comment: unsteady labored movement having to lean on armrest of chair without AD, safer using RW    Ambulation/Gait Ambulation/Gait assistance: Min assist Gait Distance (Feet): 20 Feet   Gait Pattern/deviations: Decreased step length - left, Decreased stance time - right, Decreased stride length Gait velocity: decreased     General Gait Details: slow labored unsteady movement with increased time for making turns, limited mostly due to fatigue, generalized weakness with SpO2 dropping from 94% to 87% while on 7 LPM O2  Stairs            Wheelchair Mobility     Tilt Bed    Modified Rankin (Stroke Patients Only)       Balance Overall balance assessment: Needs assistance  Sitting-balance support: Feet supported, No upper extremity supported Sitting balance-Leahy Scale: Fair Sitting balance - Comments: fair/good seated at EOB   Standing balance support: During functional activity, No upper extremity supported Standing balance-Leahy Scale:  Poor Standing balance comment: fair using RW                             Pertinent Vitals/Pain Pain Assessment Pain Assessment: No/denies pain    Home Living Family/patient expects to be discharged to:: Private residence Living Arrangements: Spouse/significant other Available Help at Discharge: Family;Available 24 hours/day Type of Home: House Home Access: Ramped entrance       Home Layout: One level Home Equipment: Cane - single point      Prior Function Prior Level of Function : Needs assist       Physical Assist : Mobility (physical);ADLs (physical) Mobility (physical): Bed mobility;Transfers;Gait;Stairs   Mobility Comments: household and short distanced community ambulation using SPC, does not drive, on 4-5 LPM O2 constant at home ADLs Comments: Assisted by family     Extremity/Trunk Assessment   Upper Extremity Assessment Upper Extremity Assessment: Defer to OT evaluation    Lower Extremity Assessment Lower Extremity Assessment: Generalized weakness    Cervical / Trunk Assessment Cervical / Trunk Assessment: Normal  Communication   Communication Communication: No apparent difficulties    Cognition Arousal: Alert Behavior During Therapy: WFL for tasks assessed/performed   PT - Cognitive impairments: No apparent impairments                         Following commands: Intact       Cueing Cueing Techniques: Verbal cues, Tactile cues     General Comments      Exercises     Assessment/Plan    PT Assessment Patient needs continued PT services  PT Problem List Decreased strength;Decreased activity tolerance;Decreased balance;Decreased mobility       PT Treatment Interventions DME instruction;Gait training;Stair training;Functional mobility training;Therapeutic activities;Therapeutic exercise;Balance training;Patient/family education    PT Goals (Current goals can be found in the Care Plan section)  Acute Rehab PT  Goals Patient Stated Goal: return home with family to assist PT Goal Formulation: With patient Time For Goal Achievement: 04/22/24 Potential to Achieve Goals: Good    Frequency Min 3X/week     Co-evaluation               AM-PAC PT "6 Clicks" Mobility  Outcome Measure Help needed turning from your back to your side while in a flat bed without using bedrails?: A Little Help needed moving from lying on your back to sitting on the side of a flat bed without using bedrails?: A Little Help needed moving to and from a bed to a chair (including a wheelchair)?: A Little Help needed standing up from a chair using your arms (e.g., wheelchair or bedside chair)?: A Little Help needed to walk in hospital room?: A Lot Help needed climbing 3-5 steps with a railing? : A Lot 6 Click Score: 16    End of Session Equipment Utilized During Treatment: Oxygen Activity Tolerance: Patient tolerated treatment well;Patient limited by fatigue Patient left: in chair;with call bell/phone within reach Nurse Communication: Mobility status PT Visit Diagnosis: Unsteadiness on feet (R26.81);Other abnormalities of gait and mobility (R26.89);Muscle weakness (generalized) (M62.81)    Time: 1610-9604 PT Time Calculation (min) (ACUTE ONLY): 26 min   Charges:   PT Evaluation $PT Eval Moderate Complexity:  1 Mod PT Treatments $Therapeutic Activity: 23-37 mins PT General Charges $$ ACUTE PT VISIT: 1 Visit         12:27 PM, 04/08/24 Walton Guppy, MPT Physical Therapist with [facility] [phone] office [phone] mobile phone

## 2024-04-08 NOTE — Assessment & Plan Note (Signed)
 Seems like vasovagal.  Mildly elevated troponin with a flat curve which is likely due to demand ischemia.  No chest pain. -Echocardiogram with hyperdynamic EF and consistent with pulmonary hypertension -Orthostatic vitals -Continue to monitor

## 2024-04-08 NOTE — Assessment & Plan Note (Signed)
 Now able to wean back to 8 L which was his most recent baseline. CTA chest was negative for PE but did show some concern of progression of pulmonary via fibrosis versus superimposed atypical infection but procalcitonin is negative. ESR normal -Steroid dose was decreased - Discontinue ceftriaxone and continue Zithromax -Pulmonology is on board -Follow recommendations from pulmonary -Continue with supportive care and supplemental oxygen-

## 2024-04-08 NOTE — Consult Note (Signed)
   TELE-PCCM CONSULT This consult was provided via telemedicine with 2-way video and audio communication.  NAME:  Christian Woodard, MRN:  [id number], DOB:  [birth year], LOS: 2 ADMISSION DATE:  04/06/2024, CONSULTATION DATE:  04/07/24 REFERRING MD:  TRH, CHIEF COMPLAINT:  SOB   History of Present Illness:  78 year old man w/ hx of pulmonary fibrosis, pulm HTN, DM2, HFpEF p/w worsening SOB x 2 weeks and recent syncope.  Workup neg for PE but does show some acute on chronic changes on CT.  Pulm consulted to review case.  Patient started on steroids, lasix , CAP coverage.  Breathing is stable. No infectious symptoms, no orthopnea or leg swelling, no sick contacts.  3L baseline now 15L  Pertinent  Medical History   Past Medical History:  Diagnosis Date   Arthritis    Barrett's esophagus    With low-grade dysplasia, following with [facility]   Diabetes mellitus    GERD (gastroesophageal reflux disease)    Hypercholesteremia      Significant Hospital Events: Including procedures, antibiotic start and stop dates in addition to other pertinent events   5/11 admit  Interim History / Subjective:  O2 needs a little better with diuresis Able to walk around unit  Objective    Blood pressure 123/71, pulse 94, temperature 97.9 F (36.6 C), temperature source Oral, resp. rate 20, height 5\' 6"  (1.676 m), weight 74.6 kg, SpO2 93%.        Intake/Output Summary (Last 24 hours) at 04/08/2024 1615 Last data filed at 04/08/2024 1130 Gross per 24 hour  Intake 844.47 ml  Output 1950 ml  Net -1105.53 ml   Filed Weights   04/06/24 1750 04/07/24 1238  Weight: 78.5 kg 74.6 kg    Examination:  No distress Nonlabored breathing pattern Stable R black eye  Assessment & Plan:  Acute on chronic hypoxemic respiratory failure- secondary to acute on chronic cor pulmonale (most likely).  Labs c/w this.  Continue IV diuresis as tolerated by renal function If recurrent fluid issues as OP need to consider  redo RHC while on Tyvaso Consider weight checks and adjusting diuretics PRN weight gain Please reach out when closer to DC and I can set up OP f/u in office  Best Practice (right click and "Reselect all SmartList Selections" daily)  Per primary  Josiah Nigh, MD  04/08/24 4:15 PM [facility]  For contact information, see Amion. If no response to pager, please call PCCM consult pager. After hours, 7PM- 7AM, please call Elink.

## 2024-04-08 NOTE — Plan of Care (Signed)
  Problem: Acute Rehab OT Goals (only OT should resolve) Goal: Pt. Will Perform Grooming Flowsheets (Taken 04/08/2024 1513) Pt Will Perform Grooming:  with modified independence  standing Goal: Pt. Will Perform Lower Body Dressing Flowsheets (Taken 04/08/2024 1513) Pt Will Perform Lower Body Dressing:  with modified independence  sitting/lateral leans Goal: Pt. Will Transfer To Toilet Flowsheets (Taken 04/08/2024 1513) Pt Will Transfer to Toilet:  with modified independence  ambulating Goal: Pt. Will Perform Toileting-Clothing Manipulation Flowsheets (Taken 04/08/2024 1513) Pt Will Perform Toileting - Clothing Manipulation and hygiene: with modified independence Goal: Pt/Caregiver Will Perform Home Exercise Program Flowsheets (Taken 04/08/2024 1513) Pt/caregiver will Perform Home Exercise Program:  Increased strength  Both right and left upper extremity  Independently  Cephas Revard OT, MOT

## 2024-04-08 NOTE — Plan of Care (Signed)
 Discussed with patient and wife plan of care for the night , pain management and medications with some teach back displayed  Problem: Education: Goal: Ability to describe self-care measures that may prevent or decrease complications (Diabetes Survival Skills Education) will improve Outcome: Progressing   Problem: Health Behavior/Discharge Planning: Goal: Ability to identify and utilize available resources and services will improve Outcome: Progressing   Problem: Pain Managment: Goal: General experience of comfort will improve and/or be controlled Outcome: Progressing

## 2024-04-08 NOTE — Plan of Care (Signed)
  Problem: Education: Goal: Ability to describe self-care measures that may prevent or decrease complications (Diabetes Survival Skills Education) will improve Outcome: Progressing Goal: Individualized Educational Video(s) Outcome: Progressing   Problem: Coping: Goal: Ability to adjust to condition or change in health will improve Outcome: Progressing   Problem: Fluid Volume: Goal: Ability to maintain a balanced intake and output will improve Outcome: Progressing   Problem: Health Behavior/Discharge Planning: Goal: Ability to identify and utilize available resources and services will improve Outcome: Progressing Goal: Ability to manage health-related needs will improve Outcome: Progressing   Problem: Metabolic: Goal: Ability to maintain appropriate glucose levels will improve Outcome: Progressing   Problem: Nutritional: Goal: Maintenance of adequate nutrition will improve Outcome: Progressing Goal: Progress toward achieving an optimal weight will improve Outcome: Progressing   Problem: Skin Integrity: Goal: Risk for impaired skin integrity will decrease Outcome: Progressing   Problem: Tissue Perfusion: Goal: Adequacy of tissue perfusion will improve Outcome: Progressing   Problem: Education: Goal: Ability to describe self-care measures that may prevent or decrease complications (Diabetes Survival Skills Education) will improve Outcome: Progressing Goal: Individualized Educational Video(s) Outcome: Progressing   Problem: Coping: Goal: Ability to adjust to condition or change in health will improve Outcome: Progressing   Problem: Fluid Volume: Goal: Ability to maintain a balanced intake and output will improve Outcome: Progressing   Problem: Health Behavior/Discharge Planning: Goal: Ability to identify and utilize available resources and services will improve Outcome: Progressing Goal: Ability to manage health-related needs will improve Outcome: Progressing

## 2024-04-08 NOTE — Evaluation (Signed)
 Occupational Therapy Evaluation Patient Details Name: Christian Woodard MRN: [id number] DOB: 1946/01/22 Today's Date: 04/08/2024   History of Present Illness   Christian Woodard is a 78 y.o. male with medical history significant for pulmonary fibrosis, pulmonary hypertension, insulin -dependent diabetes mellitus, hypertension chronic diastolic CHF, and chronic hypoxic respiratory failure who presents with increased shortness of breath and syncope.     Patient reports roughly 2 weeks of increased dyspnea.  His chronic cough has not changed much and he has not noticed any leg swelling.  He has had some rhinorrhea but attributes this to recently increasing his supplemental oxygen.  He became lightheaded today while trying to make it to the bathroom, began to fall, but was assisted to the commode by his wife.  Immediately after sitting on the toilet, he suffered a brief loss of consciousness, fell forward, and struck his head on the floor.  He quickly regained consciousness, has a headache, but no new focal numbness or weakness.     Clinical Impressions Pt agreeable to OT evaluation. Pt requires some assist for bathing at baseline. Pt was ambulating without AD prior to weakness. Today pt required CGA to min A for transfers and supervision assist for bed mobility. B UE generally weak with pt able to complete seated ADL's fairly well with set up assist. Posterior lean noted when pt standing with and without AD. High fall risk at this time. Pt will benefit from continued OT in the hospital and recommended venue below to increase strength, balance, and endurance for safe ADL's.        If plan is discharge home, recommend the following:   A little help with walking and/or transfers;A little help with bathing/dressing/bathroom;Assistance with cooking/housework;Assist for transportation;Help with stairs or ramp for entrance     Functional Status Assessment   Patient has had a recent decline in their  functional status and demonstrates the ability to make significant improvements in function in a reasonable and predictable amount of time.     Equipment Recommendations   None recommended by OT             Precautions/Restrictions   Precautions Precautions: Fall Recall of Precautions/Restrictions: Intact Restrictions Weight Bearing Restrictions Per Provider Order: No     Mobility Bed Mobility Overal bed mobility: Needs Assistance Bed Mobility: Supine to Sit, Sit to Supine     Supine to sit: Supervision Sit to supine: Supervision   General bed mobility comments: mild labored movement    Transfers Overall transfer level: Needs assistance Equipment used: Rolling walker (2 wheels) Transfers: Sit to/from Stand, Bed to chair/wheelchair/BSC Sit to Stand: Contact guard assist, Min assist     Step pivot transfers: Contact guard assist, Min assist     General transfer comment: unsteady in standing without RW; RW needed for step pivot to bed and back to chair.      Balance Overall balance assessment: Needs assistance Sitting-balance support: Feet supported, No upper extremity supported Sitting balance-Leahy Scale: Fair Sitting balance - Comments: fair/good seated at EOB   Standing balance support: No upper extremity supported, During functional activity Standing balance-Leahy Scale: Poor Standing balance comment: fair using RW; posterior lean                           ADL either performed or assessed with clinical judgement   ADL Overall ADL's : Needs assistance/impaired     Grooming: Set up;Sitting   Upper Body Bathing: Set up;Sitting  Lower Body Bathing: Set up;Sitting/lateral leans;Minimal assistance   Upper Body Dressing : Set up;Sitting   Lower Body Dressing: Set up;Sitting/lateral leans Lower Body Dressing Details (indicate cue type and reason): Able to doff and don socks seated in chair without physical assist. Toilet Transfer:  Minimal assistance;Stand-pivot;Rolling walker (2 wheels) Toilet Transfer Details (indicate cue type and reason): Simulated via EOB to chair transfer. Toileting- Clothing Manipulation and Hygiene: Contact guard assist;Sitting/lateral lean       Functional mobility during ADLs: Contact guard assist;Minimal assistance;Rolling walker (2 wheels) General ADL Comments: Pt able to take a few steps from the chair with RW.     Vision Baseline Vision/History: 1 Wears glasses (contacts) Ability to See in Adequate Light: 1 Impaired Patient Visual Report: No change from baseline Vision Assessment?: No apparent visual deficits Additional Comments: R eye bruised but not reports of change in vision. Able to track fairly well.     Perception Perception: Not tested       Praxis Praxis: Not tested       Pertinent Vitals/Pain Pain Assessment Pain Assessment: No/denies pain     Extremity/Trunk Assessment Upper Extremity Assessment Upper Extremity Assessment: Generalized weakness (3+/5 at shoulder; 4+/5 grip strength.)   Lower Extremity Assessment Lower Extremity Assessment: Defer to PT evaluation   Cervical / Trunk Assessment Cervical / Trunk Assessment: Normal   Communication Communication Communication: No apparent difficulties   Cognition Arousal: Alert Behavior During Therapy: WFL for tasks assessed/performed Cognition: No apparent impairments                               Following commands: Intact       Cueing  General Comments   Cueing Techniques: Verbal cues;Tactile cues                 Home Living Family/patient expects to be discharged to:: Private residence Living Arrangements: Spouse/significant other Available Help at Discharge: Family;Available 24 hours/day Type of Home: House Home Access: Ramped entrance     Home Layout: One level     Bathroom Shower/Tub: Chief Strategy Officer: Standard Bathroom Accessibility: Yes   Home  Equipment: Cane - single point          Prior Functioning/Environment Prior Level of Function : Needs assist;History of Falls (last six months) (4 falls in past 6 months)       Physical Assist : ADLs (physical) Mobility (physical): Bed mobility;Transfers;Gait;Stairs ADLs (physical): Bathing;IADLs Mobility Comments: household and short distanced community ambulation using SPC, does not drive, on 4-5 LPM O2 constant at home. Daughter reports pt has recently used w/c for community mobility. (per PT and pt report) ADLs Comments: Assist for bathing.    OT Problem List: Decreased strength;Impaired balance (sitting and/or standing)   OT Treatment/Interventions: Self-care/ADL training;Therapeutic exercise;Therapeutic activities;DME and/or AE instruction;Patient/family education;Balance training      OT Goals(Current goals can be found in the care plan section)   Acute Rehab OT Goals Patient Stated Goal: return home OT Goal Formulation: With patient Time For Goal Achievement: 04/22/24 Potential to Achieve Goals: Good   OT Frequency:  Min 2X/week                                   End of Session Equipment Utilized During Treatment: Rolling walker (2 wheels) Nurse Communication: Mobility status  Activity Tolerance: Patient tolerated treatment well  Patient left: in chair;with call bell/phone within reach;with family/visitor present  OT Visit Diagnosis: Unsteadiness on feet (R26.81);Other abnormalities of gait and mobility (R26.89);Muscle weakness (generalized) (M62.81);History of falling (Z91.81)                Time: 4098-1191 OT Time Calculation (min): 18 min Charges:  OT General Charges $OT Visit: 1 Visit OT Evaluation $OT Eval Low Complexity: 1 Low  Sankalp Ferrell OT, MOT  Thurnell Floss 04/08/2024, 3:10 PM

## 2024-04-08 NOTE — Assessment & Plan Note (Signed)
 Patient had elevated BNP at 416 on admission.  Can be a confounder with severe pulmonary hypertension and CKD stage IIIa.  Echocardiogram consistent with volume overload and pulmonary hypertension, hyperdynamic EF -Continue with IV Lasix  -Daily weight and BMP -Strict taken output

## 2024-04-09 DIAGNOSIS — J9621 Acute and chronic respiratory failure with hypoxia: Secondary | ICD-10-CM | POA: Diagnosis not present

## 2024-04-09 LAB — GLUCOSE, CAPILLARY
Glucose-Capillary: 109 mg/dL — ABNORMAL HIGH (ref 70–99)
Glucose-Capillary: 190 mg/dL — ABNORMAL HIGH (ref 70–99)
Glucose-Capillary: 222 mg/dL — ABNORMAL HIGH (ref 70–99)
Glucose-Capillary: 144 mg/dL — ABNORMAL HIGH (ref 70–99)

## 2024-04-09 LAB — CBC
HCT: 45.7 % (ref 39.0–52.0)
Hemoglobin: 15.1 g/dL (ref 13.0–17.0)
MCH: 30.1 pg (ref 26.0–34.0)
MCHC: 33 g/dL (ref 30.0–36.0)
MCV: 91 fL (ref 80.0–100.0)
Platelets: 345 10*3/uL (ref 150–400)
RBC: 5.02 MIL/uL (ref 4.22–5.81)
RDW: 15.3 % (ref 11.5–15.5)
WBC: 17.3 10*3/uL — ABNORMAL HIGH (ref 4.0–10.5)
nRBC: 0 % (ref 0.0–0.2)

## 2024-04-09 LAB — BASIC METABOLIC PANEL WITH GFR
Anion gap: 9 (ref 5–15)
BUN: 30 mg/dL — ABNORMAL HIGH (ref 8–23)
CO2: 32 mmol/L (ref 22–32)
Calcium: 9 mg/dL (ref 8.9–10.3)
Chloride: 99 mmol/L (ref 98–111)
Creatinine, Ser: 1.3 mg/dL — ABNORMAL HIGH (ref 0.61–1.24)
GFR, Estimated: 56 mL/min — ABNORMAL LOW (ref >60)
Glucose, Bld: 96 mg/dL (ref 70–99)
Potassium: 3.6 mmol/L (ref 3.5–5.1)
Sodium: 140 mmol/L (ref 135–145)

## 2024-04-09 LAB — LEGIONELLA PNEUMOPHILA SEROGP 1 UR AG: L. pneumophila Serogp 1 Ur Ag: NEGATIVE

## 2024-04-09 NOTE — TOC Progression Note (Signed)
 Transition of Care [facility]) - Progression Note    Patient Details  Name: Christian Woodard MRN: [id number] Date of Birth: [birth year]  Transition of Care [facility]) CM/SW Contact  Grandville Lax, [place] Phone Number: 04/09/2024, 12:08 PM  Clinical Narrative:    CSW spoke with pt and family at bedside to review bed offers. They would like to accept bed offer at [facility]. Insurance auth to be started for SNF placement. TOC to follow.   Expected Discharge Plan: Skilled Nursing Facility Barriers to Discharge: Continued Medical Work up  Expected Discharge Plan and Services In-house Referral: Clinical Social Work Discharge Planning Services: CM Consult Post Acute Care Choice: Durable Medical Equipment Living arrangements for the past 2 months: Single Family Home                   DME Agency: [employer]                   Social Determinants of Health (SDOH) Interventions SDOH Screenings   Food Insecurity: No Food Insecurity (04/07/2024)  Housing: Low Risk  (04/07/2024)  Transportation Needs: No Transportation Needs (04/07/2024)  Utilities: Not At Risk (04/07/2024)  Social Connections: Socially Integrated (04/07/2024)  Tobacco Use: Medium Risk (04/06/2024)    Readmission Risk Interventions    04/08/2024    1:51 PM 04/07/2024    7:38 AM  Readmission Risk Prevention Plan  Transportation Screening Complete Complete  Home Care Screening Complete Complete  Medication Review (RN CM) Complete Complete

## 2024-04-09 NOTE — Plan of Care (Signed)
  Problem: Education: Goal: Ability to describe self-care measures that may prevent or decrease complications (Diabetes Survival Skills Education) will improve Outcome: Progressing Goal: Individualized Educational Video(s) Outcome: Progressing   Problem: Coping: Goal: Ability to adjust to condition or change in health will improve Outcome: Progressing   Problem: Fluid Volume: Goal: Ability to maintain a balanced intake and output will improve Outcome: Progressing   Problem: Health Behavior/Discharge Planning: Goal: Ability to identify and utilize available resources and services will improve Outcome: Progressing Goal: Ability to manage health-related needs will improve Outcome: Progressing   Problem: Metabolic: Goal: Ability to maintain appropriate glucose levels will improve Outcome: Progressing   Problem: Nutritional: Goal: Maintenance of adequate nutrition will improve Outcome: Progressing Goal: Progress toward achieving an optimal weight will improve Outcome: Progressing   Problem: Skin Integrity: Goal: Risk for impaired skin integrity will decrease Outcome: Progressing   Problem: Tissue Perfusion: Goal: Adequacy of tissue perfusion will improve Outcome: Progressing   Problem: Education: Goal: Ability to describe self-care measures that may prevent or decrease complications (Diabetes Survival Skills Education) will improve Outcome: Progressing Goal: Individualized Educational Video(s) Outcome: Progressing   Problem: Coping: Goal: Ability to adjust to condition or change in health will improve Outcome: Progressing   Problem: Fluid Volume: Goal: Ability to maintain a balanced intake and output will improve Outcome: Progressing   Problem: Health Behavior/Discharge Planning: Goal: Ability to identify and utilize available resources and services will improve Outcome: Progressing   Problem: Health Behavior/Discharge Planning: Goal: Ability to manage  health-related needs will improve Outcome: Progressing   Problem: Metabolic: Goal: Ability to maintain appropriate glucose levels will improve Outcome: Progressing   Problem: Nutritional: Goal: Maintenance of adequate nutrition will improve Outcome: Progressing Goal: Progress toward achieving an optimal weight will improve Outcome: Progressing   Problem: Skin Integrity: Goal: Risk for impaired skin integrity will decrease Outcome: Progressing   Problem: Tissue Perfusion: Goal: Adequacy of tissue perfusion will improve Outcome: Progressing   Problem: Education: Goal: Knowledge of General Education information will improve Description: Including pain rating scale, medication(s)/side effects and non-pharmacologic comfort measures Outcome: Progressing   Problem: Health Behavior/Discharge Planning: Goal: Ability to manage health-related needs will improve Outcome: Progressing   Problem: Clinical Measurements: Goal: Ability to maintain clinical measurements within normal limits will improve Outcome: Progressing Goal: Will remain free from infection Outcome: Progressing Goal: Diagnostic test results will improve Outcome: Progressing Goal: Respiratory complications will improve Outcome: Progressing Goal: Cardiovascular complication will be avoided Outcome: Progressing   Problem: Activity: Goal: Risk for activity intolerance will decrease Outcome: Progressing   Problem: Nutrition: Goal: Adequate nutrition will be maintained Outcome: Progressing   Problem: Coping: Goal: Level of anxiety will decrease Outcome: Progressing   Problem: Elimination: Goal: Will not experience complications related to bowel motility Outcome: Progressing Goal: Will not experience complications related to urinary retention Outcome: Progressing   Problem: Pain Managment: Goal: General experience of comfort will improve and/or be controlled Outcome: Progressing   Problem: Safety: Goal:  Ability to remain free from injury will improve Outcome: Progressing   Problem: Skin Integrity: Goal: Risk for impaired skin integrity will decrease Outcome: Progressing

## 2024-04-09 NOTE — Progress Notes (Addendum)
 Progress Note   Patient: Christian Woodard:[id number] DOB: 1946-07-09 DOA: 04/06/2024     3 DOS: the patient was seen and examined on 04/09/2024   Brief hospital course: Taken from H&P.   ANAS KEYE is a 78 y.o. male with medical history significant for pulmonary fibrosis, pulmonary hypertension, insulin -dependent diabetes mellitus, hypertension chronic diastolic CHF, and chronic hypoxic respiratory failure who presents with increased shortness of breath and syncope.   Patient has worsening shortness of breath for about 2 weeks, had a syncopal episode while walking to the bathroom with a brief loss of consciousness, fall and struck his head on floor.Christian Woodard  On presentation hypoxic requiring up to 15 L of oxygen, with mild tachycardia and tachypnea.  Labs with glucose of 300, creatinine 1.44, WBC 12,200, troponin 45 and BNP 416.  Procalcitonin negative. CTA negative for PE, increased subpleural opacities which can represent progression of interstitial lung disease or superimposed atypical infection.  Mild mediastinal and hilar adenopathy.  Patient was started on empiric antibiotics and IV diuresis.  Pulmonology was also consulted and they recommended to start him on high-dose steroid.  5/12: Vitals currently stable on 15 L of oxygen.  Slight worsening of leukocytosis but patient is on high-dose steroid.  Patient was using 8 L of oxygen over the past few weeks at home.  5/13: Hemodynamically stable, able to wean back to 8 L of oxygen which was his baseline.  Steroid dose was decreased to 50 mg by pulmonary as ESR was normal.  Procalcitonin remained negative so discontinuing ceftriaxone. PT is recommending SNF.  Assessment and Plan: 1)Acute on chronic respiratory failure with hypoxia (HCC) CTA chest was negative for PE but did show some concern of progression of pulmonary fibrosis versus superimposed atypical infection but procalcitonin is negative. ESR normal -Treated with Rocephin,  azithromycin and steroids -Pulmonology consult noted -Currently requiring 8 L of oxygen via nasal cannula = Will need follow-up with pulmonologist postdischarge  Syncope Seems like vasovagal.  Mildly elevated troponin with a flat curve which is likely due to demand ischemia.  No chest pain. -Echocardiogram with hyperdynamic EF and consistent with pulmonary hypertension -Orthostatic vitals -Continue to monitor  Acute on chronic diastolic CHF (congestive heart failure) (HCC) Patient had elevated BNP at 416 on admission.  Can be a confounder with severe pulmonary hypertension and CKD stage IIIa.  Echocardiogram consistent with volume overload and pulmonary hypertension, hyperdynamic EF -Continue with IV Lasix  -Daily weight and BMP -Strict taken output  Pulmonary fibrosis (HCC) - Continuing home pirfenidone  -Started on high-dose steroid for concern of pulmonary fibrosis exacerbation/progression. - Pulmonary consult from Dr. Felton Hough appreciated - Patient sees Dr. Bertrum Brodie as outpatient  Pulmonary hypertension in the setting of pulmonary fibrosis - Continue Treprostinil   Type 2 diabetes mellitus without complication (HCC) CBG elevated, patient is on high-dose steroid. - Increasing the dose of Semglee  to 20 units twice daily -Increasing mealtime coverage -Making SSI moderate  CKD stage 3a, GFR 45-59 ml/min (HCC) Creatinine with some improvement at 1.21 today. Seems stable -Monitor renal function while patient is being diuresed -Avoid nephrotoxins   Subjective:  - Wife at bedside, questions answered - Dyspnea with minimal exertion - Currently requiring 8 L of oxygen via nasal cannula  Physical Exam: Vitals:   04/09/24 1000 04/09/24 1220 04/09/24 1700 04/09/24 1746  BP: (!) 99/57     Pulse: 87     Resp: 17     Temp:  97.7 F (36.5 C) 97.9 F (36.6 C)  TempSrc:  Axillary Axillary   SpO2: (!) 86% 91%  94%  Weight:      Height:        Physical Exam  Gen:-  Awake Alert, in no acute distress , dyspnea on exertion noted HEENT:- [place].AT, No sclera icterus Nose- [place] 8L/min Neck-Supple Neck,No JVD,.  Lungs-diminished breath sounds, few scattered wheezes  CV- S1, S2 normal, RRR Abd-  +ve B.Sounds, Abd Soft, No tenderness,    Extremity/Skin:- No  edema,   good pedal pulses  Psych-affect is appropriate, oriented x3 Neuro-generalized weakness, no new focal deficits, no tremors   Family Communication: Discussed with wife and granddaughter at bedside  Disposition: AWaiting transfer to SNF rehab pending insurance authorization Status is: Inpatient    DVT prophylaxis.  Lovenox   Author: Colin Dawley, MD 04/09/2024 7:05 PM  For on call review [email or web address].

## 2024-04-10 ENCOUNTER — Inpatient Hospital Stay (HOSPITAL_COMMUNITY)

## 2024-04-10 DIAGNOSIS — I700 Atherosclerosis of aorta: Secondary | ICD-10-CM | POA: Diagnosis not present

## 2024-04-10 DIAGNOSIS — R55 Syncope and collapse: Secondary | ICD-10-CM | POA: Diagnosis not present

## 2024-04-10 DIAGNOSIS — E119 Type 2 diabetes mellitus without complications: Secondary | ICD-10-CM | POA: Diagnosis not present

## 2024-04-10 DIAGNOSIS — I5033 Acute on chronic diastolic (congestive) heart failure: Secondary | ICD-10-CM | POA: Diagnosis not present

## 2024-04-10 DIAGNOSIS — J8410 Pulmonary fibrosis, unspecified: Secondary | ICD-10-CM | POA: Diagnosis not present

## 2024-04-10 DIAGNOSIS — F4320 Adjustment disorder, unspecified: Secondary | ICD-10-CM | POA: Diagnosis not present

## 2024-04-10 DIAGNOSIS — I272 Pulmonary hypertension, unspecified: Secondary | ICD-10-CM | POA: Diagnosis not present

## 2024-04-10 DIAGNOSIS — J9621 Acute and chronic respiratory failure with hypoxia: Secondary | ICD-10-CM | POA: Diagnosis not present

## 2024-04-10 DIAGNOSIS — F4322 Adjustment disorder with anxiety: Secondary | ICD-10-CM | POA: Diagnosis not present

## 2024-04-10 DIAGNOSIS — K219 Gastro-esophageal reflux disease without esophagitis: Secondary | ICD-10-CM | POA: Diagnosis not present

## 2024-04-10 DIAGNOSIS — K22710 Barrett's esophagus with low grade dysplasia: Secondary | ICD-10-CM | POA: Diagnosis not present

## 2024-04-10 DIAGNOSIS — N1831 Chronic kidney disease, stage 3a: Secondary | ICD-10-CM | POA: Diagnosis not present

## 2024-04-10 DIAGNOSIS — I517 Cardiomegaly: Secondary | ICD-10-CM | POA: Diagnosis not present

## 2024-04-10 DIAGNOSIS — R0609 Other forms of dyspnea: Secondary | ICD-10-CM | POA: Diagnosis not present

## 2024-04-10 DIAGNOSIS — J9612 Chronic respiratory failure with hypercapnia: Secondary | ICD-10-CM | POA: Diagnosis not present

## 2024-04-10 LAB — GLUCOSE, CAPILLARY
Glucose-Capillary: 102 mg/dL — ABNORMAL HIGH (ref 70–99)
Glucose-Capillary: 194 mg/dL — ABNORMAL HIGH (ref 70–99)

## 2024-04-10 LAB — BASIC METABOLIC PANEL WITH GFR
Anion gap: 11 (ref 5–15)
BUN: 30 mg/dL — ABNORMAL HIGH (ref 8–23)
CO2: 29 mmol/L (ref 22–32)
Calcium: 8.8 mg/dL — ABNORMAL LOW (ref 8.9–10.3)
Chloride: 101 mmol/L (ref 98–111)
Creatinine, Ser: 1.3 mg/dL — ABNORMAL HIGH (ref 0.61–1.24)
GFR, Estimated: 56 mL/min — ABNORMAL LOW (ref >60)
Glucose, Bld: 76 mg/dL (ref 70–99)
Potassium: 3.4 mmol/L — ABNORMAL LOW (ref 3.5–5.1)
Sodium: 141 mmol/L (ref 135–145)

## 2024-04-10 LAB — CBC
HCT: 44.5 % (ref 39.0–52.0)
Hemoglobin: 14.5 g/dL (ref 13.0–17.0)
MCH: 29.5 pg (ref 26.0–34.0)
MCHC: 32.6 g/dL (ref 30.0–36.0)
MCV: 90.6 fL (ref 80.0–100.0)
Platelets: 342 10*3/uL (ref 150–400)
RBC: 4.91 MIL/uL (ref 4.22–5.81)
RDW: 15.3 % (ref 11.5–15.5)
WBC: 14.8 10*3/uL — ABNORMAL HIGH (ref 4.0–10.5)
nRBC: 0 % (ref 0.0–0.2)

## 2024-04-10 MED ORDER — POTASSIUM CHLORIDE CRYS ER 20 MEQ PO TBCR
40.0000 meq | EXTENDED_RELEASE_TABLET | ORAL | Status: AC
  Administered 2024-04-10 (×2): 40 meq via ORAL
  Filled 2024-04-10 (×2): qty 2

## 2024-04-10 MED ORDER — SENNOSIDES-DOCUSATE SODIUM 8.6-50 MG PO TABS: 2.0000 | ORAL_TABLET | Freq: Every day | ORAL | 1 refills | Status: DC

## 2024-04-10 MED ORDER — ACETAMINOPHEN 325 MG PO TABS: 650.0000 mg | ORAL_TABLET | Freq: Four times a day (QID) | ORAL | Status: DC | PRN

## 2024-04-10 MED ORDER — NOVOLOG FLEXPEN 100 UNIT/ML ~~LOC~~ SOPN: 0.0000 [IU] | PEN_INJECTOR | SUBCUTANEOUS | 11 refills | Status: DC

## 2024-04-10 MED ORDER — LOSARTAN POTASSIUM 25 MG PO TABS: 25.0000 mg | ORAL_TABLET | Freq: Every day | ORAL | 3 refills | Status: DC

## 2024-04-10 MED ORDER — FAMOTIDINE 20 MG PO TABS: 20.0000 mg | ORAL_TABLET | Freq: Every day | ORAL | 3 refills | Status: DC

## 2024-04-10 MED ORDER — ASPIRIN EC 81 MG PO TBEC: 81.0000 mg | DELAYED_RELEASE_TABLET | Freq: Every day | ORAL | 11 refills | Status: DC

## 2024-04-10 MED ORDER — FUROSEMIDE 40 MG PO TABS: 40.0000 mg | ORAL_TABLET | Freq: Every day | ORAL | 2 refills | Status: DC

## 2024-04-10 MED ORDER — PREDNISONE 20 MG PO TABS: 40.0000 mg | ORAL_TABLET | Freq: Every day | ORAL | 0 refills | Status: AC

## 2024-04-10 MED ORDER — LANTUS SOLOSTAR 100 UNIT/ML ~~LOC~~ SOPN: 20.0000 [IU] | PEN_INJECTOR | Freq: Two times a day (BID) | SUBCUTANEOUS | 11 refills | Status: DC

## 2024-04-10 NOTE — Care Management Important Message (Signed)
 Important Message  Patient Details  Name: Christian Woodard MRN: [id number] Date of Birth: 1946-01-09   Important Message Given:  Yes - Medicare IM     [name] 04/10/2024, 9:37 AM

## 2024-04-10 NOTE — Plan of Care (Signed)
  Problem: Education: Goal: Ability to describe self-care measures that may prevent or decrease complications (Diabetes Survival Skills Education) will improve Outcome: Progressing Goal: Individualized Educational Video(s) Outcome: Progressing   Problem: Coping: Goal: Ability to adjust to condition or change in health will improve Outcome: Progressing   Problem: Fluid Volume: Goal: Ability to maintain a balanced intake and output will improve Outcome: Progressing   Problem: Health Behavior/Discharge Planning: Goal: Ability to identify and utilize available resources and services will improve Outcome: Progressing Goal: Ability to manage health-related needs will improve Outcome: Progressing   Problem: Metabolic: Goal: Ability to maintain appropriate glucose levels will improve Outcome: Progressing   Problem: Nutritional: Goal: Maintenance of adequate nutrition will improve Outcome: Progressing Goal: Progress toward achieving an optimal weight will improve Outcome: Progressing   Problem: Skin Integrity: Goal: Risk for impaired skin integrity will decrease Outcome: Progressing   Problem: Tissue Perfusion: Goal: Adequacy of tissue perfusion will improve Outcome: Progressing   Problem: Education: Goal: Ability to describe self-care measures that may prevent or decrease complications (Diabetes Survival Skills Education) will improve Outcome: Progressing Goal: Individualized Educational Video(s) Outcome: Progressing   Problem: Coping: Goal: Ability to adjust to condition or change in health will improve Outcome: Progressing   Problem: Fluid Volume: Goal: Ability to maintain a balanced intake and output will improve Outcome: Progressing   Problem: Health Behavior/Discharge Planning: Goal: Ability to identify and utilize available resources and services will improve Outcome: Progressing Goal: Ability to manage health-related needs will improve Outcome: Progressing    Problem: Metabolic: Goal: Ability to maintain appropriate glucose levels will improve Outcome: Progressing   Problem: Nutritional: Goal: Maintenance of adequate nutrition will improve Outcome: Progressing Goal: Progress toward achieving an optimal weight will improve Outcome: Progressing   Problem: Skin Integrity: Goal: Risk for impaired skin integrity will decrease Outcome: Progressing   Problem: Tissue Perfusion: Goal: Adequacy of tissue perfusion will improve Outcome: Progressing   Problem: Education: Goal: Knowledge of General Education information will improve Description: Including pain rating scale, medication(s)/side effects and non-pharmacologic comfort measures Outcome: Progressing   Problem: Health Behavior/Discharge Planning: Goal: Ability to manage health-related needs will improve Outcome: Progressing   Problem: Clinical Measurements: Goal: Ability to maintain clinical measurements within normal limits will improve Outcome: Progressing Goal: Will remain free from infection Outcome: Progressing Goal: Diagnostic test results will improve Outcome: Progressing Goal: Respiratory complications will improve Outcome: Progressing Goal: Cardiovascular complication will be avoided Outcome: Progressing   Problem: Activity: Goal: Risk for activity intolerance will decrease Outcome: Progressing   Problem: Nutrition: Goal: Adequate nutrition will be maintained Outcome: Progressing   Problem: Coping: Goal: Level of anxiety will decrease Outcome: Progressing   Problem: Elimination: Goal: Will not experience complications related to bowel motility Outcome: Progressing Goal: Will not experience complications related to urinary retention Outcome: Progressing   Problem: Pain Managment: Goal: General experience of comfort will improve and/or be controlled Outcome: Progressing   Problem: Safety: Goal: Ability to remain free from injury will improve Outcome:  Progressing   Problem: Skin Integrity: Goal: Risk for impaired skin integrity will decrease Outcome: Progressing

## 2024-04-10 NOTE — Discharge Instructions (Signed)
 1)Short Acting Novolog  insulin  per sliding scale 0-10 ---:-- Insulin  injection 0-10 Units 0-10 Units Subcutaneous, 3 times daily with meals CBG 70 - 120: 0 unit CBG 121 - 150: 0 unit  CBG 151 - 200: 2 unit CBG 201 - 250: 4 units CBG 251 - 300: 6 units CBG 301 - 350: 8 units  CBG 351 - 400: 10 units  CBG > 400: 12 units  2) repeat CBC and BMP blood test in 5 to 7 days  3)you need oxygen at home at 6 L via nasal cannula continuously while awake and while asleep--- smoking or having open fires around oxygen can cause fire, significant injury and death

## 2024-04-10 NOTE — TOC Transition Note (Signed)
 Transition of Care [facility]) - Discharge Note   Patient Details  Name: Christian Woodard MRN: [id number] Date of Birth: 1946/07/31  [facility] CM/SW Contact:  Grandville Lax, LCSWA Phone Number: 04/10/2024, 12:39 PM  Clinical Narrative:    CSW updated that insurance [employer] has been approved at this time. CSW spoke to [place] with [facility] who states they are able to accept pt today. CSW sent D/C clinicals to facility via HUB. CSW updated pts spouse of plan for D/C. CSW provided RN with numbers for room and report. [facility] called for transport. TOC signing off.   Final next level of care: Skilled Nursing Facility Barriers to Discharge: Barriers Resolved   Patient Goals and CMS Choice Patient states their goals for this hospitalization and ongoing recovery are:: go to SNF CMS Medicare.gov Compare Post Acute Care list provided to:: Patient Choice offered to / list presented to : Patient, Spouse [place] ownership interest in [facility].provided to:: Patient    Discharge Placement                Patient to be transferred to facility by: [facility] Name of family member notified: Wife Devra Fontana Patient and family notified of of transfer: 04/10/24  Discharge Plan and Services Additional resources added to the After Visit Summary for   In-house Referral: Clinical Social Work Discharge Planning Services: CM Consult Post Acute Care Choice: Durable Medical Equipment            DME Agency: [employer]                  Social Drivers of Health (SDOH) Interventions SDOH Screenings   Food Insecurity: No Food Insecurity (04/07/2024)  Housing: Low Risk  (04/07/2024)  Transportation Needs: No Transportation Needs (04/07/2024)  Utilities: Not At Risk (04/07/2024)  Social Connections: Socially Integrated (04/07/2024)  Tobacco Use: Medium Risk (04/06/2024)     Readmission Risk Interventions    04/08/2024    1:51 PM 04/07/2024    7:38 AM  Readmission Risk  Prevention Plan  Transportation Screening Complete Complete  Home Care Screening Complete Complete  Medication Review (RN CM) Complete Complete

## 2024-04-10 NOTE — Discharge Summary (Signed)
 Christian Woodard, is a 78 y.o. male  DOB May 04, 1946  MRN [id number].  Admission date:  04/06/2024  Admitting Physician  Walton Guppy, MD  Discharge Date:  04/10/2024   Primary MD  Artemisa Bile, MD  Recommendations for primary care physician for things to follow:  1)Short Acting Novolog  insulin  per sliding scale 0-10 ---:-- Insulin  injection 0-10 Units 0-10 Units Subcutaneous, 3 times daily with meals CBG 70 - 120: 0 unit CBG 121 - 150: 0 unit  CBG 151 - 200: 2 unit CBG 201 - 250: 4 units CBG 251 - 300: 6 units CBG 301 - 350: 8 units  CBG 351 - 400: 10 units  CBG > 400: 12 units  2) repeat CBC and BMP blood test in 5 to 7 days  3)you need oxygen at home at 6 L via nasal cannula continuously while awake and while asleep--- smoking or having open fires around oxygen can cause fire, significant injury and death  Admission Diagnosis  Acute on chronic respiratory failure with hypoxia (HCC) [J96.21] Acute on chronic hypoxic respiratory failure (HCC) [J96.21]   Discharge Diagnosis  Acute on chronic respiratory failure with hypoxia (HCC) [J96.21] Acute on chronic hypoxic respiratory failure (HCC) [J96.21]    Principal Problem:   Acute on chronic respiratory failure with hypoxia (HCC) Active Problems:   Syncope   Acute on chronic diastolic CHF (congestive heart failure) (HCC)   Pulmonary fibrosis (HCC)   Pulmonary hypertension (HCC)   Type 2 diabetes mellitus without complication (HCC)   CKD stage 3a, GFR 45-59 ml/min (HCC)      Past Medical History:  Diagnosis Date   Arthritis    Barrett's esophagus    With low-grade dysplasia, following with [facility]   Diabetes mellitus    GERD (gastroesophageal reflux disease)    Hypercholesteremia     Past Surgical History:  Procedure Laterality Date   BACK SURGERY  2000   CHOLECYSTECTOMY     COLONOSCOPY  09/15/2011   Dr. Riley Cheadle: Suboptimal prep. Pancolonic  diverticulosis. Tubular adenomas. Next colonoscopy 5 years.   COLONOSCOPY N/A 09/22/2016   Surgeon: Suzette Espy, MD; pancolonic diverticulosis, otherwise normal exam.  Recommended repeat in 5 years.   COLONOSCOPY WITH PROPOFOL  N/A 09/29/2021   Procedure: COLONOSCOPY WITH PROPOFOL ;  Surgeon: Suzette Espy, MD;  [facility];  Service: Endoscopy;  Laterality: N/A;  9:00am   ESOPHAGOGASTRODUODENOSCOPY N/A 08/31/2017   Surgeon: Suzette Espy, MD;  Barrett's esophagus with low-grade dysplasia, small hiatal hernia with abnormal gastric mucosa biopsied (no H. pylori), normal examined duodenum.     ESOPHAGOGASTRODUODENOSCOPY  11/2018   [facility]; Barrett's esophagus, negative for dysplasia   ESOPHAGOGASTRODUODENOSCOPY  01/2020   [facility]; Barrett's esophagus, indefinite for focal low-grade dysplasia.   GIVENS CAPSULE STUDY N/A 10/26/2017   Surgeon: Suzette Espy, MD; localized clustered areas of white "blunted appearing" abnormal small bowel mucosa/villi of uncertain significance.  Follow-up CTE and small bowel enteroscopy at [facility] without significant findings.    GIVENS CAPSULE STUDY N/A 05/15/2023  Procedure: GIVENS CAPSULE STUDY;  Surgeon: Suzette Espy, MD;  Location: [facility];  Service: Endoscopy;  Laterality: N/A;  730am   HERNIA REPAIR     as a child   left cornea transplant Left    POLYPECTOMY  09/29/2021   Procedure: POLYPECTOMY;  Surgeon: Suzette Espy, MD;  Location: [facility];  Service: Endoscopy;;   Right cornea transplant     RIGHT HEART CATH N/A 12/17/2023   Procedure: RIGHT HEART CATH;  Surgeon: Darlis Eisenmenger, MD;  Location: [facility] INVASIVE CV LAB;  Service: Cardiovascular;  Laterality: N/A;   SMALL BOWEL ENTEROSCOPY  11/2017   [facility]; Barrett's esophagus focally positive for low-grade dysplasia, normal stomach, normal duodenum, normal jejunum.  Small bowel biopsies were benign.   TONSILLECTOMY       HPI  from the history and physical done on the day of admission:    Chief Complaint: LOC with fall and head injury, increased SOB    HPI: Christian Woodard is a 78 y.o. male with medical history significant for pulmonary fibrosis, pulmonary hypertension, insulin -dependent diabetes mellitus, hypertension chronic diastolic CHF, and chronic hypoxic respiratory failure who presents with increased shortness of breath and syncope.   Patient reports roughly 2 weeks of increased dyspnea.  His chronic cough has not changed much and he has not noticed any leg swelling.  He has had some rhinorrhea but attributes this to recently increasing his supplemental oxygen.  He became lightheaded today while trying to make it to the bathroom, began to fall, but was assisted to the commode by his wife.  Immediately after sitting on the toilet, he suffered a brief loss of consciousness, fell forward, and struck his head on the floor.  He quickly regained consciousness, has a headache, but no new focal numbness or weakness.   ED Course: Upon arrival to the ED, patient is found to be afebrile and saturating mid 90s on 15 L/min of supplemental oxygen with tachypnea, transient tachycardia, and stable BP.  Labs are most notable for glucose 300, creatinine 1.44, WBC 12,200, troponin 45, and BNP 03/06/2015.   Review of Systems:  All other systems reviewed and apart from HPI, are negative.  Hospital Course:     1)Acute on chronic respiratory failure with hypoxia (HCC) CTA chest was negative for PE but did show some concern of progression of pulmonary fibrosis versus superimposed atypical infection but procalcitonin is negative. ESR normal -Treated with Rocephin, azithromycin and steroids -Pulmonology consult noted -Currently requiring 6 L of oxygen via nasal cannula = Will need follow-up with pulmonologist postdischarge   2)Syncope Seems like vasovagal.  Mildly elevated troponin with a flat curve which is likely due to demand ischemia.  No chest pain. -Echocardiogram with hyperdynamic EF  > 75 % and consistent with pulmonary hypertension -overall Hemodynamically stable at this time   3)Acute on chronic diastolic CHF Patient had elevated BNP at 416 on admission.  Can be a confounder with severe pulmonary hypertension and CKD stage IIIa.  Echocardiogram consistent with volume overload and pulmonary hypertension, hyperdynamic EF-with EF greater than 75% - Treated with IV Lasix  -Okay to discharge on Lasix  40 mg daily, PTA patient was on Lasix  20 mg daily   4)Pulmonary fibrosis (HCC) - Continuing home pirfenidone  - Treated with high-dose steroid for concern of pulmonary fibrosis exacerbation/progression. - Pulmonary consult from Dr. Felton Hough appreciated - Patient sees Dr. Bertrum Brodie as outpatient -Discharged on prednisone    -Pulmonary hypertension in the setting of pulmonary fibrosis -  Continue Treprostinil    Type 2 diabetes mellitus without complication (HCC) -.A1c 7.6 ---reflecting uncontrolled DM with hyperglycemia PTA -suspect some degree of steroid-induced hyperglycemia -Lantus  changed to 20 units twice daily -Use Novolog /Humalog Sliding scale insulin  with Accu-Cheks/Fingersticks as ordered    CKD stage 3a, GFR 45-59 ml/min (HCC) Creatinine stable at 1.30, from 1.44 on admission -renally adjust medications, avoid nephrotoxic agents / dehydration  / hypotension  Discharge Condition: stable  Follow UP   Follow-up Information     Artemisa Bile, MD. Schedule an appointment as soon as possible for a visit.   Specialty: Internal Medicine Why: If symptoms worsen Contact information: [address] [place] [address] [phone]                 Consults obtained - Pulm  Diet and Activity recommendation:  As advised  Discharge Instructions    Discharge Instructions     Call MD for:  difficulty breathing, headache or visual disturbances   Complete by: As directed    Call MD for:  persistant dizziness or light-headedness   Complete by: As  directed    Call MD for:  temperature >100.4   Complete by: As directed    Diet - low sodium heart healthy   Complete by: As directed    Diet Carb Modified   Complete by: As directed    Discharge instructions   Complete by: As directed    1)Short Acting Novolog  insulin  per sliding scale 0-10 ---:-- Insulin  injection 0-10 Units 0-10 Units Subcutaneous, 3 times daily with meals CBG 70 - 120: 0 unit CBG 121 - 150: 0 unit  CBG 151 - 200: 2 unit CBG 201 - 250: 4 units CBG 251 - 300: 6 units CBG 301 - 350: 8 units  CBG 351 - 400: 10 units  CBG > 400: 12 units  2) repeat CBC and BMP blood test in 5 to 7 days  3)you need oxygen at home at 6 L via nasal cannula continuously while awake and while asleep--- smoking or having open fires around oxygen can cause fire, significant injury and death   Increase activity slowly   Complete by: As directed          Discharge Medications     Allergies as of 04/10/2024   No Known Allergies      Medication List     TAKE these medications    acetaminophen  325 MG tablet Commonly known as: TYLENOL  Take 2 tablets (650 mg total) by mouth every 6 (six) hours as needed for mild pain (pain score 1-3) (or Fever >/= 101). What changed:  medication strength how much to take reasons to take this   aspirin  EC 81 MG tablet Take 1 tablet (81 mg total) by mouth daily with breakfast. Swallow whole. What changed: when to take this   atorvastatin  10 MG tablet Commonly known as: LIPITOR Take 10 mg by mouth at bedtime.   cyanocobalamin  1000 MCG tablet Commonly known as: VITAMIN B12 Take 1,000 mcg by mouth daily.   famotidine  20 MG tablet Commonly known as: PEPCID  Take 1 tablet (20 mg total) by mouth daily. What changed:  medication strength how much to take when to take this   furosemide  40 MG tablet Commonly known as: Lasix  Take 1 tablet (40 mg total) by mouth daily. What changed:  medication strength how much to take   gabapentin  300 MG  capsule Commonly known as: Neurontin  Take 1 capsule (300 mg total) by mouth  2 (two) times daily.   glipiZIDE  5 MG 24 hr tablet Commonly known as: GLUCOTROL  XL Take 5 mg by mouth daily with breakfast.   IRON  27 PO Take 1 tablet by mouth daily.   Iyuzeh 0.005 % Soln Generic drug: Latanoprost PF Apply 1 drop to eye at bedtime.   Lantus  SoloStar 100 UNIT/ML Solostar Pen Generic drug: insulin  glargine Inject 20 Units into the skin 2 (two) times daily. What changed:  how much to take when to take this   losartan  25 MG tablet Commonly known as: COZAAR  Take 1 tablet (25 mg total) by mouth daily. Start taking on: Apr 11, 2024 What changed:  medication strength how much to take when to take this Another medication with the same name was removed. Continue taking this medication, and follow the directions you see here.   metFORMIN  500 MG tablet Commonly known as: GLUCOPHAGE  Take 500 mg by mouth 2 (two) times daily.   NovoLOG  FlexPen 100 UNIT/ML FlexPen Generic drug: insulin  aspart Inject 0-12 Units into the skin See admin instructions. Short acting insulin  per sliding scale 0-10 ---:-- Insulin  injection 0-10 Units 0-10 Units Subcutaneous, 3 times daily with meals CBG 70 - 120: 0 unit CBG 121 - 150: 0 unit  CBG 151 - 200: 2 unit CBG 201 - 250: 4 units CBG 251 - 300: 6 units CBG 301 - 350: 8 units  CBG 351 - 400: 10 units  CBG > 400: 12 units What changed:  how much to take when to take this additional instructions   pantoprazole  40 MG tablet Commonly known as: PROTONIX  TAKE 1 TABLET BY MOUTH ONE TIME DAILY 30-60 MINUTES BEFORE BREAKFAST   Pirfenidone  267 MG Tabs Take 2 tablets (534 mg total) by mouth in the morning, at noon, and at bedtime. **low dose as maintenance**   potassium chloride  10 MEQ CR capsule Commonly known as: MICRO-K  Take 10 mEq by mouth daily.   predniSONE  20 MG tablet Commonly known as: DELTASONE  Take 2 tablets (40 mg total) by mouth daily with breakfast  for 5 days.   senna-docusate 8.6-50 MG tablet Commonly known as: Senokot-S Take 2 tablets by mouth at bedtime.   Tyvaso DPI Maintenance Kit 64 MCG Powd Generic drug: Treprostinil Inhale into the lungs.               Durable Medical Equipment  (From admission, onward)           Start     Ordered   04/08/24 1337  For home use only DME Bedside commode  Once       Comments: Patient at risk for falls, will benefit from use of 3n1 BSC  Question:  Patient needs a bedside commode to treat with the following condition  Answer:  Gait difficulty   04/08/24 1337   04/08/24 1332  For home use only DME Walker rolling  Once       Comments: Patient very unsteady on feet and high risk for falls, patient will benefit from use of RW for home use with good return for use demonstrated  Question Answer Comment  Walker: With 5 Inch Wheels   Patient needs a walker to treat with the following condition Gait difficulty      04/08/24 1333            Major procedures and Radiology Reports - PLEASE review detailed and final reports for all details, in brief -   DG CHEST PORT 1 VIEW Result Date: 04/10/2024 CLINICAL  DATA:  [id number].  Dyspnea and respiratory abnormalities. EXAM: PORTABLE CHEST 1 VIEW COMPARISON:  Portable chest 04/06/2024. FINDINGS: 4:46 a.m. There are chronic interstitial changes in the lung bases. No focal pneumonia is evident or pleural effusion. There is mild cardiomegaly without evidence of CHF. There is a stable mediastinum with aortic atherosclerosis heaviest in the arch. No new osseous findings. Osteopenia. Thoracic spondylosis. Multiple overlying monitor wires. Compare: Unchanged. IMPRESSION: 1. No evidence of acute chest disease. Chronic changes. 2. Mild cardiomegaly. 3. Aortic atherosclerosis. Electronically Signed   By: Denman Fischer M.D.   On: 04/10/2024 05:46   ECHOCARDIOGRAM COMPLETE Result Date: 04/07/2024    ECHOCARDIOGRAM REPORT   Patient Name:   Christian Woodard [facility]  Date of Exam: 04/07/2024 Medical Rec #:  [id number]         Height:       66.0 in Accession #:    [id number]        Weight:       164.4 lb Date of Birth:  08/05/1946        BSA:          1.840 m Patient Age:    77 years          BP:           113/73 mmHg Patient Gender: M                 HR:           86 bpm. Exam Location:  [facility] Procedure: 2D Echo, Cardiac Doppler, Color Doppler and Intracardiac            Opacification Agent (Both Spectral and Color Flow Doppler were            utilized during procedure). Indications:    Syncope R55  History:        Patient has prior history of Echocardiogram examinations, most                 recent 11/30/2023. CHF, Arrythmias:Tachycardia,                 Signs/Symptoms:Syncope; Risk Factors:Hypertension.  Sonographer:    BW Referring Phys: [id number] Christian Woodard  Sonographer Comments: Somewhat Difficult imaging. IMPRESSIONS  1. Left ventricular ejection fraction, by estimation, is >75%. The left ventricle has hyperdynamic function. The left ventricle has no regional wall motion abnormalities. Left ventricular diastolic parameters are indeterminate. There is the interventricular septum is flattened in systole and diastole, consistent with right ventricular pressure and volume overload.  2. TAPSE and tissue anular velocity are misleading regarding RV function. The anulus moves well but the RV free wall is seveverly hypokinetic. . Right ventricular systolic function moderately to severely reduced. The right ventricular size is moderately  to severely enlarged. Tricuspid regurgitation signal is inadequate for assessing PA pressure.  3. A small pericardial effusion is present. The pericardial effusion is circumferential. There is no evidence of cardiac tamponade.  4. The mitral valve is normal in structure. No evidence of mitral valve regurgitation. No evidence of mitral stenosis.  5. The aortic valve is tricuspid. Aortic valve regurgitation is not visualized. No aortic stenosis  is present.  6. The inferior vena cava is normal in size with greater than 50% respiratory variability, suggesting right atrial pressure of 3 mmHg. FINDINGS  Left Ventricle: Left ventricular ejection fraction, by estimation, is >75%. The left ventricle has hyperdynamic function. The left ventricle has no regional wall motion abnormalities. Definity  contrast agent was  given IV to delineate the left ventricular endocardial borders. The left ventricular internal cavity size was normal in size. There is no left ventricular hypertrophy. The interventricular septum is flattened in systole and diastole, consistent with right ventricular pressure and volume overload. Left ventricular diastolic parameters are indeterminate. Right Ventricle: TAPSE and tissue anular velocity are misleading regarding RV function. The anulus moves well but the RV free wall is seveverly hypokinetic. The right ventricular size is moderately to severely enlarged. Right vetricular wall thickness was not well visualized. Right ventricular systolic function moderately to severely reduced. Tricuspid regurgitation signal is inadequate for assessing PA pressure. Left Atrium: Left atrial size was normal in size. Right Atrium: Right atrial size was not well visualized. Pericardium: A small pericardial effusion is present. The pericardial effusion is circumferential. There is no evidence of cardiac tamponade. Mitral Valve: The mitral valve is normal in structure. No evidence of mitral valve regurgitation. No evidence of mitral valve stenosis. Tricuspid Valve: The tricuspid valve is not well visualized. Tricuspid valve regurgitation is not demonstrated. No evidence of tricuspid stenosis. Aortic Valve: The aortic valve is tricuspid. Aortic valve regurgitation is not visualized. No aortic stenosis is present. Aortic valve mean gradient measures 3.3 mmHg. Aortic valve peak gradient measures 6.2 mmHg. Aortic valve area, by VTI measures 2.24 cm. Pulmonic Valve:  The pulmonic valve was not well visualized. Pulmonic valve regurgitation is not visualized. No evidence of pulmonic stenosis. Aorta: The aortic root is normal in size and structure and the ascending aorta was not well visualized. Venous: The inferior vena cava is normal in size with greater than 50% respiratory variability, suggesting right atrial pressure of 3 mmHg. IAS/Shunts: The interatrial septum appears to be lipomatous. No atrial level shunt detected by color flow Doppler.  LEFT VENTRICLE PLAX 2D LVIDd:         3.30 cm   Diastology LVIDs:         1.70 cm   LV e' medial:    3.81 cm/s LV PW:         1.00 cm   LV E/e' medial:  31.8 LV IVS:        0.90 cm   LV e' lateral:   7.51 cm/s LVOT diam:     2.00 cm   LV E/e' lateral: 16.1 LV SV:         53 LV SV Index:   29 LVOT Area:     3.14 cm  RIGHT VENTRICLE RV S prime:     15.50 cm/s TAPSE (M-mode): 2.2 cm LEFT ATRIUM           Index        RIGHT ATRIUM          Index LA diam:      2.70 cm 1.47 cm/m   RA Area:     9.83 cm LA Vol (A2C): 19.4 ml 10.54 ml/m  RA Volume:   19.40 ml 10.54 ml/m LA Vol (A4C): 44.0 ml 23.92 ml/m  AORTIC VALVE AV Area (Vmax):    2.44 cm AV Area (Vmean):   2.17 cm AV Area (VTI):     2.24 cm AV Vmax:           124.34 cm/s AV Vmean:          86.815 cm/s AV VTI:            0.235 m AV Peak Grad:      6.2 mmHg AV Mean Grad:      3.3  mmHg LVOT Vmax:         96.50 cm/s LVOT Vmean:        59.900 cm/s LVOT VTI:          0.168 m LVOT/AV VTI ratio: 0.71  AORTA Ao Root diam: 3.50 cm MITRAL VALVE MV Area (PHT): 5.02 cm     SHUNTS MV Decel Time: 151 msec     Systemic VTI:  0.17 m MV E velocity: 121.00 cm/s  Systemic Diam: 2.00 cm MV A velocity: 40.40 cm/s MV E/A ratio:  3.00 Christian Lander MD Electronically signed by Christian Lander MD Signature Date/Time: 04/07/2024/3:11:50 PM    Final    CT Angio Chest PE W and/or Wo Contrast Result Date: 04/06/2024 CLINICAL DATA:  Pulmonary embolism (PE) suspected, high prob Near syncope. EXAM: CT  ANGIOGRAPHY CHEST WITH CONTRAST TECHNIQUE: Multidetector CT imaging of the chest was performed using the standard protocol during bolus administration of intravenous contrast. Multiplanar CT image reconstructions and MIPs were obtained to evaluate the vascular anatomy. RADIATION DOSE REDUCTION: This exam was performed according to the departmental dose-optimization program which includes automated exposure control, adjustment of the mA and/or kV according to patient size and/or use of iterative reconstruction technique. CONTRAST:  75mL OMNIPAQUE  IOHEXOL  350 MG/ML SOLN COMPARISON:  Radiograph earlier today.  Chest CT 07/23/2023 FINDINGS: Cardiovascular: There are no filling defects within the pulmonary arteries to suggest pulmonary embolus. Aortic atherosclerosis without aneurysm. No evidence of acute aortic findings on this exam not tailored to aortic assessment. There are coronary artery calcifications. Heart is mildly enlarged. No pericardial effusion. Mediastinum/Nodes: Mild mediastinal adenopathy. Subcarinal/low infrahilar node measures 13 mm series 4, image 49. Anterior paratracheal node measures 15 mm series 4, image 36. There also bone prominent bilateral hilar nodes, more so on the right. Decompressed esophagus. Lungs/Pleura: Mild emphysema. Increase in subpleural reticulation and ground-glass from prior exam, basilar predominant. This may represent progression of interstitial lung disease or superimposed atypical infection. No confluent consolidation. No pleural fluid. No no bronchial lesion or debris. Upper Abdomen: Mild contrast refluxing into the hepatic veins and IVC. Cholecystectomy. No acute upper abdominal findings. Musculoskeletal: There are no acute or suspicious osseous abnormalities. Review of the MIP images confirms the above findings. IMPRESSION: 1. No pulmonary embolus. 2. Increase in subpleural reticulation and ground-glass from prior exam, basilar predominant. This may represent progression  of interstitial lung disease or superimposed atypical infection. 3. Mild mediastinal and hilar adenopathy, likely reactive. 4. Mild cardiomegaly. Of contrast refluxing into the hepatic veins can be seen with right heart dysfunction. Coronary artery calcifications. Aortic Atherosclerosis (ICD10-I70.0) and Emphysema (ICD10-J43.9). Electronically Signed   By: Chadwick Colonel M.D.   On: 04/06/2024 22:44   DG Chest Portable 1 View Result Date: 04/06/2024 CLINICAL DATA:  Status post fall. EXAM: PORTABLE CHEST 1 VIEW COMPARISON:  May 18, 2023 FINDINGS: The cardiac silhouette is mildly enlarged and unchanged in size. There is marked severity calcification of the thoracic aorta. Mild, diffuse, chronic appearing increased interstitial lung markings are seen. There is no evidence of focal consolidation, pleural effusion or pneumothorax. Multilevel degenerative changes are seen throughout the thoracic spine. IMPRESSION: Stable cardiomegaly and chronic appearing increased interstitial lung markings without evidence of acute or active cardiopulmonary disease. Electronically Signed   By: Virgle Grime M.D.   On: 04/06/2024 20:51   CT Head Wo Contrast Result Date: 04/06/2024 CLINICAL DATA:  Head trauma, near syncope. EXAM: CT HEAD WITHOUT CONTRAST TECHNIQUE: Contiguous axial images were obtained from the base of the  skull through the vertex without intravenous contrast. RADIATION DOSE REDUCTION: This exam was performed according to the departmental dose-optimization program which includes automated exposure control, adjustment of the mA and/or kV according to patient size and/or use of iterative reconstruction technique. COMPARISON:  None Available. FINDINGS: Brain: No acute intracranial hemorrhage. No CT evidence of acute infarct. No edema, mass effect, or midline shift. The basilar cisterns are patent. Ventricles: The ventricles are normal. Vascular: Atherosclerotic calcifications of the carotid siphons and  intracranial vertebral arteries. No hyperdense vessel. Skull: No acute or aggressive finding. Orbits: Bilateral lens replacement. Mild soft tissue swelling involving the superior preseptal soft tissues of the right orbit. Sinuses: Mild mucosal thickening in the ethmoid and sphenoid sinuses. Other: Large right frontal/forehead scalp hematoma. Soft tissue swelling extends over the supraorbital ridge with partial involvement of the superior preseptal soft tissues of the right orbit. Mastoid air cells are clear. IMPRESSION: No CT evidence of acute intracranial abnormality. Right frontal scalp/forehead hematoma. Soft tissue swelling extends over the supraorbital ridge with involvement of the superior preseptal soft tissues of the right orbit. Electronically Signed   By: Denny Flack M.D.   On: 04/06/2024 20:24    Micro Results   Recent Results (from the past 240 hours)  MRSA Next Gen by PCR, Nasal     Status: None   Collection Time: 04/07/24 12:49 PM   Specimen: Nasal Mucosa; Nasal Swab  Result Value Ref Range Status   MRSA by PCR Next Gen NOT DETECTED NOT DETECTED Final    Comment: (NOTE) The GeneXpert MRSA Assay (FDA approved for NASAL specimens only), is one component of a comprehensive MRSA colonization surveillance program. It is not intended to diagnose MRSA infection nor to guide or monitor treatment for MRSA infections. Test performance is not FDA approved in patients less than 19 years old. Performed at [facility], [address] [place], [place] [address]     Today   Subjective    Christian Woodard today has no new complaints - Wife at bedside, questions answered - Participated in physical therapy today --Oxygen requirement around 5 to 6 L currently          Patient has been seen and examined prior to discharge   Objective   Blood pressure (!) 129/57, pulse 90, temperature 97.6 F (36.4 C), temperature source Oral, resp. rate 15, height 5\' 6"  (1.676 m), weight 74.2 kg, SpO2  (!) 88%.   Intake/Output Summary (Last 24 hours) at 04/10/2024 1227 Last data filed at 04/10/2024 0433 Gross per 24 hour  Intake --  Output 2300 ml  Net -2300 ml    Exam Gen:- Awake Alert, no acute distress , speaking complete sentences HEENT:- [place].AT, No sclera icterus, right periorbital ecchymosis Nose- [place] 6L/min Neck-Supple Neck,No JVD,.  Lungs-proving air movement, no wheezing  CV- S1, S2 normal, regular Abd-  +ve B.Sounds, Abd Soft, No tenderness,    Extremity/Skin:- No  edema,   good pulses Psych-affect is appropriate, oriented x3 Neuro-generalized weakness, no new focal deficits, no tremors    Data Review   CBC w Diff:  Lab Results  Component Value Date   WBC 14.8 (H) 04/10/2024   HGB 14.5 04/10/2024   HGB 12.9 (L) 10/22/2023   HCT 44.5 04/10/2024   HCT 40.6 10/22/2023   PLT 342 04/10/2024   PLT 418 10/22/2023   LYMPHOPCT 7 04/06/2024   MONOPCT 8 04/06/2024   EOSPCT 1 04/06/2024   BASOPCT 1 04/06/2024   CMP:  Lab Results  Component Value Date   NA 141 04/10/2024   NA 139 09/28/2023   K 3.4 (L) 04/10/2024   CL 101 04/10/2024   CO2 29 04/10/2024   BUN 30 (H) 04/10/2024   BUN 18 09/28/2023   CREATININE 1.30 (H) 04/10/2024   PROT 6.7 04/06/2024   ALBUMIN 3.7 04/06/2024   BILITOT 1.0 04/06/2024   ALKPHOS 80 04/06/2024   AST 36 04/06/2024   ALT 23 04/06/2024  .  Total Discharge time is about 33 minutes  Colin Dawley M.D on 04/10/2024 at 12:27 PM  Go to [email or web address] -  for contact info  [facility] - Office  [phone]

## 2024-04-10 NOTE — Progress Notes (Signed)
 Physical Therapy Treatment Patient Details Name: Christian Woodard MRN: [id number] DOB: [birth year] Today's Date: 04/10/2024   History of Present Illness Christian Woodard is a 78 y.o. male with medical history significant for pulmonary fibrosis, pulmonary hypertension, insulin -dependent diabetes mellitus, hypertension chronic diastolic CHF, and chronic hypoxic respiratory failure who presents with increased shortness of breath and syncope.     Patient reports roughly 2 weeks of increased dyspnea.  His chronic cough has not changed much and he has not noticed any leg swelling.  He has had some rhinorrhea but attributes this to recently increasing his supplemental oxygen.  He became lightheaded today while trying to make it to the bathroom, began to fall, but was assisted to the commode by his wife.  Immediately after sitting on the toilet, he suffered a brief loss of consciousness, fell forward, and struck his head on the floor.  He quickly regained consciousness, has a headache, but no new focal numbness or weakness.    PT Comments  Patient demonstrates slow labored movement for sitting up at bedside, fair/good return for completing BLE exercises, but had difficulty fully extending knees due to weakness, tolerated ambulating in room with slow labored movement and limited mostly due to fatigue and SOB with SpO2 dropping from 94% to 86% while on 6 LPM O2. Patient tolerated sitting up in chair after therapy with spouse present. Patient will benefit from continued skilled physical therapy in hospital and recommended venue below to increase strength, balance, endurance for safe ADLs and gait.       If plan is discharge home, recommend the following: A lot of help with bathing/dressing/bathroom;Help with stairs or ramp for entrance;Assistance with cooking/housework;A lot of help with walking and/or transfers   Can travel by private vehicle     Yes  Equipment Recommendations  Rolling walker (2  wheels);BSC/3in1    Recommendations for Other Services       Precautions / Restrictions Precautions Precautions: Fall Recall of Precautions/Restrictions: Intact Restrictions Weight Bearing Restrictions Per Provider Order: No     Mobility  Bed Mobility Overal bed mobility: Needs Assistance Bed Mobility: Supine to Sit     Supine to sit: Supervision     General bed mobility comments: sllightly labored movement    Transfers Overall transfer level: Needs assistance Equipment used: Rolling walker (2 wheels) Transfers: Sit to/from Stand, Bed to chair/wheelchair/BSC Sit to Stand: Contact guard assist, Min assist   Step pivot transfers: Contact guard assist       General transfer comment: unsteady labored movement    Ambulation/Gait Ambulation/Gait assistance: Min assist Gait Distance (Feet): 22 Feet Assistive device: Rolling walker (2 wheels) Gait Pattern/deviations: Decreased step length - right, Decreased step length - left, Decreased stride length, Wide base of support Gait velocity: decreased     General Gait Details: slightly increased endurance/distance with slow labored movement, mild incordination of legs and limited mostly due to fatigue and SOB with SpO2 dropping from 94% to 86% while on 6 LPM O2   Stairs             Wheelchair Mobility     Tilt Bed    Modified Rankin (Stroke Patients Only)       Balance Overall balance assessment: Needs assistance Sitting-balance support: Feet supported, No upper extremity supported Sitting balance-Leahy Scale: Fair Sitting balance - Comments: fair/good seated at EOB   Standing balance support: During functional activity, Bilateral upper extremity supported Standing balance-Leahy Scale: Poor Standing balance comment: fair/poor using RW  Communication Communication Communication: No apparent difficulties  Cognition Arousal: Alert Behavior During Therapy: WFL for  tasks assessed/performed   PT - Cognitive impairments: No apparent impairments                         Following commands: Intact      Cueing Cueing Techniques: Verbal cues, Tactile cues  Exercises General Exercises - Lower Extremity Long Arc Quad: Seated, AROM, Strengthening, Both, 10 reps Hip Flexion/Marching: Seated, AROM, Strengthening, Both, 10 reps Toe Raises: Seated, AROM, Strengthening, Both, 10 reps Heel Raises: Seated, AROM, Strengthening, Both, 10 reps    General Comments        Pertinent Vitals/Pain Pain Assessment Pain Assessment: No/denies pain    Home Living                          Prior Function            PT Goals (current goals can now be found in the care plan section) Acute Rehab PT Goals Patient Stated Goal: return home with family to assist PT Goal Formulation: With patient Time For Goal Achievement: 04/22/24 Potential to Achieve Goals: Good Progress towards PT goals: Progressing toward goals    Frequency    Min 3X/week      PT Plan      Co-evaluation              AM-PAC PT "6 Clicks" Mobility   Outcome Measure  Help needed turning from your back to your side while in a flat bed without using bedrails?: None Help needed moving from lying on your back to sitting on the side of a flat bed without using bedrails?: A Little Help needed moving to and from a bed to a chair (including a wheelchair)?: A Little Help needed standing up from a chair using your arms (e.g., wheelchair or bedside chair)?: A Little Help needed to walk in hospital room?: A Lot Help needed climbing 3-5 steps with a railing? : A Lot 6 Click Score: 17    End of Session Equipment Utilized During Treatment: Oxygen Activity Tolerance: Patient tolerated treatment well;Patient limited by fatigue Patient left: in chair;with call bell/phone within reach;with family/visitor present Nurse Communication: Mobility status PT Visit Diagnosis:  Unsteadiness on feet (R26.81);Other abnormalities of gait and mobility (R26.89);Muscle weakness (generalized) (M62.81)     Time: 2956-2130 PT Time Calculation (min) (ACUTE ONLY): 23 min  Charges:    $Therapeutic Exercise: 8-22 mins $Therapeutic Activity: 8-22 mins PT General Charges $$ ACUTE PT VISIT: 1 Visit                     12:28 PM, 04/10/24 Walton Guppy, MPT Physical Therapist with [facility] 336 [phone] office [phone] mobile phone

## 2024-04-11 DIAGNOSIS — E119 Type 2 diabetes mellitus without complications: Secondary | ICD-10-CM | POA: Diagnosis not present

## 2024-04-11 DIAGNOSIS — J8410 Pulmonary fibrosis, unspecified: Secondary | ICD-10-CM | POA: Diagnosis not present

## 2024-04-11 DIAGNOSIS — J9612 Chronic respiratory failure with hypercapnia: Secondary | ICD-10-CM | POA: Diagnosis not present

## 2024-04-14 ENCOUNTER — Other Ambulatory Visit: Payer: Self-pay

## 2024-04-16 ENCOUNTER — Other Ambulatory Visit: Payer: Self-pay

## 2024-04-16 DIAGNOSIS — J9612 Chronic respiratory failure with hypercapnia: Secondary | ICD-10-CM | POA: Diagnosis not present

## 2024-04-16 DIAGNOSIS — J8410 Pulmonary fibrosis, unspecified: Secondary | ICD-10-CM | POA: Diagnosis not present

## 2024-04-18 DIAGNOSIS — J9612 Chronic respiratory failure with hypercapnia: Secondary | ICD-10-CM | POA: Diagnosis not present

## 2024-04-18 DIAGNOSIS — J8410 Pulmonary fibrosis, unspecified: Secondary | ICD-10-CM | POA: Diagnosis not present

## 2024-04-25 ENCOUNTER — Other Ambulatory Visit: Payer: Self-pay | Admitting: Internal Medicine

## 2024-04-25 ENCOUNTER — Other Ambulatory Visit: Payer: Self-pay

## 2024-04-25 DIAGNOSIS — J84112 Idiopathic pulmonary fibrosis: Secondary | ICD-10-CM

## 2024-04-25 MED ORDER — PIRFENIDONE 267 MG PO TABS
534.0000 mg | ORAL_TABLET | Freq: Three times a day (TID) | ORAL | 2 refills | Status: DC
  Filled 2024-04-25 – 2024-05-06 (×2): qty 180, 30d supply, fill #0
  Filled 2024-06-05: qty 180, 30d supply, fill #1
  Filled 2024-06-26 (×2): qty 180, 30d supply, fill #2

## 2024-05-01 DIAGNOSIS — R55 Syncope and collapse: Secondary | ICD-10-CM | POA: Diagnosis not present

## 2024-05-01 DIAGNOSIS — I5032 Chronic diastolic (congestive) heart failure: Secondary | ICD-10-CM | POA: Diagnosis not present

## 2024-05-01 DIAGNOSIS — J9611 Chronic respiratory failure with hypoxia: Secondary | ICD-10-CM | POA: Diagnosis not present

## 2024-05-03 DIAGNOSIS — L03116 Cellulitis of left lower limb: Secondary | ICD-10-CM | POA: Diagnosis not present

## 2024-05-03 DIAGNOSIS — I272 Pulmonary hypertension, unspecified: Secondary | ICD-10-CM | POA: Diagnosis not present

## 2024-05-03 DIAGNOSIS — J9621 Acute and chronic respiratory failure with hypoxia: Secondary | ICD-10-CM | POA: Diagnosis not present

## 2024-05-03 DIAGNOSIS — G35 Multiple sclerosis: Secondary | ICD-10-CM | POA: Diagnosis not present

## 2024-05-03 DIAGNOSIS — N1831 Chronic kidney disease, stage 3a: Secondary | ICD-10-CM | POA: Diagnosis not present

## 2024-05-03 DIAGNOSIS — I5033 Acute on chronic diastolic (congestive) heart failure: Secondary | ICD-10-CM | POA: Diagnosis not present

## 2024-05-03 DIAGNOSIS — J8410 Pulmonary fibrosis, unspecified: Secondary | ICD-10-CM | POA: Diagnosis not present

## 2024-05-03 DIAGNOSIS — E1122 Type 2 diabetes mellitus with diabetic chronic kidney disease: Secondary | ICD-10-CM | POA: Diagnosis not present

## 2024-05-03 DIAGNOSIS — G40909 Epilepsy, unspecified, not intractable, without status epilepticus: Secondary | ICD-10-CM | POA: Diagnosis not present

## 2024-05-05 DIAGNOSIS — I5033 Acute on chronic diastolic (congestive) heart failure: Secondary | ICD-10-CM | POA: Diagnosis not present

## 2024-05-05 DIAGNOSIS — G40909 Epilepsy, unspecified, not intractable, without status epilepticus: Secondary | ICD-10-CM | POA: Diagnosis not present

## 2024-05-05 DIAGNOSIS — I272 Pulmonary hypertension, unspecified: Secondary | ICD-10-CM | POA: Diagnosis not present

## 2024-05-05 DIAGNOSIS — G35 Multiple sclerosis: Secondary | ICD-10-CM | POA: Diagnosis not present

## 2024-05-05 DIAGNOSIS — J9621 Acute and chronic respiratory failure with hypoxia: Secondary | ICD-10-CM | POA: Diagnosis not present

## 2024-05-05 DIAGNOSIS — E1122 Type 2 diabetes mellitus with diabetic chronic kidney disease: Secondary | ICD-10-CM | POA: Diagnosis not present

## 2024-05-05 DIAGNOSIS — L03116 Cellulitis of left lower limb: Secondary | ICD-10-CM | POA: Diagnosis not present

## 2024-05-05 DIAGNOSIS — N1831 Chronic kidney disease, stage 3a: Secondary | ICD-10-CM | POA: Diagnosis not present

## 2024-05-05 DIAGNOSIS — J8410 Pulmonary fibrosis, unspecified: Secondary | ICD-10-CM | POA: Diagnosis not present

## 2024-05-06 ENCOUNTER — Other Ambulatory Visit (HOSPITAL_COMMUNITY): Payer: Self-pay

## 2024-05-06 ENCOUNTER — Encounter: Payer: Self-pay | Admitting: Internal Medicine

## 2024-05-06 ENCOUNTER — Ambulatory Visit: Admitting: Internal Medicine

## 2024-05-06 ENCOUNTER — Other Ambulatory Visit: Payer: Self-pay

## 2024-05-06 VITALS — BP 92/58 | HR 61 | Ht 66.0 in | Wt 160.0 lb

## 2024-05-06 DIAGNOSIS — Z79899 Other long term (current) drug therapy: Secondary | ICD-10-CM | POA: Diagnosis not present

## 2024-05-06 DIAGNOSIS — J84112 Idiopathic pulmonary fibrosis: Secondary | ICD-10-CM

## 2024-05-06 DIAGNOSIS — J9611 Chronic respiratory failure with hypoxia: Secondary | ICD-10-CM

## 2024-05-06 DIAGNOSIS — J209 Acute bronchitis, unspecified: Secondary | ICD-10-CM

## 2024-05-06 DIAGNOSIS — Z5181 Encounter for therapeutic drug level monitoring: Secondary | ICD-10-CM

## 2024-05-06 DIAGNOSIS — I2723 Pulmonary hypertension due to lung diseases and hypoxia: Secondary | ICD-10-CM

## 2024-05-06 LAB — PULMONARY FUNCTION TEST
DL/VA % pred: 33 %
DL/VA: 1.35 ml/min/mmHg/L
DLCO cor % pred: 24 %
DLCO cor: 4.98 ml/min/mmHg
DLCO unc % pred: 24 %
DLCO unc: 4.97 ml/min/mmHg
FEF 25-75 Pre: 1.21 L/s
FEF2575-%Pred-Pre: 77 %
FEV1-%Pred-Pre: 79 %
FEV1-Pre: 1.77 L
FEV1FVC-%Pred-Pre: 102 %
FEV6-%Pred-Pre: 81 %
FEV6-Pre: 2.4 L
FEV6FVC-%Pred-Pre: 108 %
FVC-%Pred-Pre: 75 %
FVC-Pre: 2.41 L
Pre FEV1/FVC ratio: 74 %
Pre FEV6/FVC Ratio: 100 %

## 2024-05-06 MED ORDER — PREDNISONE 10 MG PO TABS: ORAL_TABLET | ORAL | 0 refills | Status: DC

## 2024-05-06 NOTE — Progress Notes (Signed)
Spiro/DLCO performed today. 

## 2024-05-06 NOTE — Progress Notes (Addendum)
 Brief patient profile:  78 yowm quit smoking 2000 with mild doe @ wt 156  and did fine until around 2013 gave up pushing mower and gradually downhill since referred to pulmonary clinic 06/14/2017 by Dr   Hildy Lowers   06/14/2017 1st [facility] office visit/ [facility]  No resp rx  Chief Complaint  Patient presents with   Pulmonary Consult    Referred by Dr. Artemisa Bile. Pt c/o SOB x 6 months, esp worse over the past 3 months. He states he gets out of breath walking an incline, such as to his mailbox. He states that he sometimes he wakes up in the night feeling SOB.  He also c/o non prod cough.   doe x 5 years,  Stops at top of incline on way back to house x 6 months  = MMRC1 = can walk nl pace, flat grade, can't hurry or go uphills or steps s sob   Never tried inhalers Worse in heat and humbidity  freq wakes with dry cough / chest tightness sits up and goes away  rec You do not have significant copd at this point and likely never will  Pantoprazole  (protonix ) 40 mg   Take  30-60 min before first meal of the day and Pepcid  (famotidine )  20 mg one @  bedtime until return to office - this is the best way to tell whether stomach acid is contributing to your problem.   GERD diet     08/20/2017  f/u ov/[facility] re: doe on gerd rx/ gi eval in progress for anemia/ no resp rx  Chief Complaint  Patient presents with   Follow-up    PFT's done today. Increased SOB for the past day. Chest feels heavy today.  continues to have noct spells of sob despite nl BNP and no evidence of chf on cxr or obvious anemia Tells me his hgb still trending down and GI eval in progess rec You do not have any significant copd If you continue to have breathing issues after your anemia is corrected might consider trial off ACTOS next and /or cardiology evaluation    Admission date:  10/03/2019     Discharge Date:  10/07/2019   Discharge Diagnosis  AKI (acute kidney injury) (HCC) [N17.9] COVID-19 virus infection  [U07.1]     Acute respiratory failure with hypoxia (HCC)   Acute respiratory disease due to COVID-19 virus   Type 2 diabetes mellitus without complication (HCC)   Essential hypertension      10/27/19 History of Present Illness: Dyspnea:  2 laps off 02 prior to discharge / trend is improving at home but not checking sats  Cough: minimal clear  Sleeping: flat/ one pillow SABA use: none 02: none  Dex last day 10/12/2019 rec Make sure you check your oxygen saturation at your highest level of activity to be sure it stays over 90% and keep track of it at least once a week, more often if breathing getting worse, and let me know if losing ground.>>> did not do       01/19/2021  f/u ov/[place] office/[facility] re: doe p covid  Chief Complaint  Patient presents with   Follow-up    Breathing is overall doing well and no new co's today.    Dyspnea:  Up and down the hill to the mb daily feels it getting a little harder than it was about a month ago since feet started bothering him with paresthesias and lack of balance  Cough: none/ some p eating assoc with mild dysphagia ? Barrett's  Sleeping: flat bed / R side / 1 pillow SABA use: none  02: none Covid status: vax x 3  rec Stay as active as you can and call if losing ground with exercise tolerance  Pulmonary follow up is as directed by Dr Hildy Lowers  Late add:  Based on desats with no explanation and c/o worse ex tol > HRCT/pfts next step > no specific findings/ 7 mm nodule needs f/u @ 6-12 m     04/2023 started on 02    06/29/2023  post hosp f/u ov/[place] office/[facility] re: doe/ cough p covid 09/2019 maint on symbicort  160  better since admit  Chief Complaint  Patient presents with   Shortness of Breath  Dyspnea:  has not  tried mailbox yet, just room to room and not titraitng 02  Cough: no change baseline  Sleeping: bed is flat/ high pillow under head  SABA use: combivent   02: 4lpm cont since admit  Rec Continue 4lpm at bedtime for  now  Make sure you check your oxygen saturation  AT  your highest level of activity (not after you stop)   to be sure it stays over 90%   Please schedule a follow up office visit in 4 weeks, sooner if needed  with all medications /inhalers/ solutions in hand    07/27/2023  f/u ov/[place] office/[facility] re: post covid resp failure maint on symbicort / prn combivent   f/u hrct done 07/23/23  Chief Complaint  Patient presents with   Dyspnea on exertion  Dyspnea:  last mailbox trip was prior to June 2024 100 ft with hills - wife worried about his balance   Cough: not a problem  Sleeping: bed is flat s  resp cc  SABA use:  not since last ov  02: 4lpm cont s humidity  Rec My office will be contacting you by phone for referral to [facility] for best fit evaluation for portable 02 and humdity for 02   Try breztri  (or symbicort )  Take 2 puffs first thing in am and then another 2 puffs about 12 hours later.   Work on inhaler technique:   >>>  Remember how golfers warm up by taking practice swings - do this with an empty inhaler  I emphasized that nasal steroids (Nasacort AQ) have no immediate benefit  Please schedule a follow up visit in 3 months but call sooner if needed   - HRCT 07/23/23  c/w mild progressive since  c/w Prob UIP > referred to PF clinic 07/30/2023     OV 09/03/2023 transfer of care from Dr. Vernestine Gondola to Dr. Bertrum Brodie in the ILD center.  Subjective:  Patient ID: Christian Woodard, male , DOB: December 08, 1945 , age 78 y.o. , MRN: [id number] , AD[address]ESS: [address] [place] [address] PCP Artemisa Bile, MD Patient Care Team: Artemisa Bile, MD as PCP - General (Internal Medicine) Riley Cheadle Windsor Hatcher, MD as Consulting Physician (Gastroenterology) Diamond Formica, MD as Consulting Physician (Pulmonary Disease)  This Provider for this visit: Treatment Team:  Attending Provider: [name], MD    09/03/2023 -   Chief Complaint  Patient presents with   Pulmonary Consult     Referred by  Dr. Waymond Hailey for IPF.      HPI Christian Woodard 78 y.o. -presents on referral from Dr. er.  History is taken from the patient, his wife's granddaughter who stated bedside and also reviewed the records.  He  tells me that in 2020 he had COVID-19 he was hospitalized at [facility] here in [place].  This was then the COVID hospital.  He was treated with oxygen and then he got better.  And then he was almost back to baseline according to the wife.  Occasionally gets short of breath.  Then starting in April May 2024 he started noticing shortness of breath going to the mailbox and coming back.  This then resulted in hospitalization for 1 day at [facility]..  Much of this history was confirmed there.  He had new onset hypoxemia and discharged on 3-4 L oxygen which she continues to use but there is a problem checking his pulse ox because the finger tracings are very poor at home.  He has continued this amount of oxygen.  The finger pulse ox would not pick up at all.  I turned his oxygen off and his pulse ox today was 88-91% on room air with a forehead probe [corresponding finger probe was 55%].  Review of the records indicate that he did have history of chronic Barrett's and's esophagus.  In the hospital iron  deficiency anemia hemoglobin 12.5 g was noticed.  He was started on supplemental iron .  He underwent endoscopy later in July 2024 at [facility].  Barrett's esophagus with low-grade dysplasia was confirmed.  In the hospital he was discharged with a diagnosis of acute/subacute hypoxemic respiratory failure secondary to pulmonary fibrosis.  Also diastolic heart failure.  An echo in the hospital showed EF 65 to 70%.  It appears after that he was diuresed.  A follow-up BNP in August 2024 is now.    He did undergo follow-up high-resolution CT chest July 23, 2023: It shows definite presence of ILD in the bases and a craniocaudal gradient.  It is described as probable UIP but I wonder if this is all  indeterminate.  He had a previous CT chest in June 2023 that did have some mild ILD findings and before that in February 2022 which was mostly ILA/nearly normal in my view.  He has had abnormal PFTs with a low DLCO is 2018 last PFT 2022 and between 2018 and 2022 DLCO reduced and overall stable.  His current CT does show coronary artery calcification.  Twelve-lead EKG today because he was tachycardic and irregular showed PVC but sinus rhythm otherwise.  It appears no change from baseline.  I personally visualized this.  CT Chest data from date: 07/23/23  - personally visualized and independently interpreted : YES - my findings are: as below Narrative & Impression  CLINICAL DATA:  78 year old male with history of shortness of breath for the past 6 months, worsening over the past 3 months.   EXAM: CT CHEST WITHOUT CONTRAST   TECHNIQUE: Multidetector CT imaging of the chest was performed following the standard protocol without intravenous contrast. High resolution imaging of the lungs, as well as inspiratory and expiratory imaging, was performed.   RADIATION DOSE REDUCTION: This exam was performed according to the departmental dose-optimization program which includes automated exposure control, adjustment of the mA and/or kV according to patient size and/or use of iterative reconstruction technique.   COMPARISON:  Chest CT 05/25/2022. High-resolution chest CT 01/21/2021.   FINDINGS: Cardiovascular: Heart size is normal. There is no significant pericardial fluid, thickening or pericardial calcification. There is aortic atherosclerosis, as well as atherosclerosis of the great vessels of the mediastinum and the coronary arteries, including calcified atherosclerotic plaque in the left main, left anterior descending, left circumflex  and right coronary arteries.   Mediastinum/Nodes: No pathologically enlarged mediastinal or hilar lymph nodes. Please note that accurate exclusion of hilar  adenopathy is limited on noncontrast CT scans. Esophagus is unremarkable in appearance. No axillary lymphadenopathy.   Lungs/Pleura: High-resolution images demonstrate widespread areas of mild ground-glass attenuation, septal thickening, scattered subpleural reticulation, mild cylindrical bronchiectasis and some peripheral bronchiolectasis. These findings have a mild craniocaudal gradient and are mildly progressive compared to prior examinations. No frank honeycombing confidently identified. Inspiratory and expiratory imaging demonstrates some very mild air trapping, indicative of mild small airways disease. No acute consolidative airspace disease. No pleural effusions.   Upper Abdomen: Aortic atherosclerosis.  Status post cholecystectomy.   Musculoskeletal: There are no aggressive appearing lytic or blastic lesions noted in the visualized portions of the skeleton.   IMPRESSION: 1. Mild progression of interstitial lung disease with a spectrum of findings considered probable usual interstitial pneumonia (UIP) on today's examination. 2. Aortic atherosclerosis, in addition to left main and three-vessel coronary artery disease. Please note that although the presence of coronary artery calcium  documents the presence of coronary artery disease, the severity of this disease and any potential stenosis cannot be assessed on this non-gated CT examination. Assessment for potential risk factor modification, dietary therapy or pharmacologic therapy may be warranted, if clinically indicated.   Aortic Atherosclerosis (ICD10-I70.0).     Electronically Signed   By: Alexandria Angel M.D.   On: 07/30/2023 10:59      Result History   OV 11/07/2023  Subjective:  Patient ID: Christian Woodard, male , DOB: October 15, 1946 , age 20 y.o. , MRN: [id number] , AD[address]: [address] [place] [address] PCP Artemisa Bile, MD Patient Care Team: Artemisa Bile, MD as PCP - General (Internal  Medicine) Lasalle Pointer, MD as PCP - Cardiology (Cardiology) Riley Cheadle Windsor Hatcher, MD as Consulting Physician (Gastroenterology) Diamond Formica, MD as Consulting Physician (Pulmonary Disease)  This Provider for this visit: Treatment Team:  Attending Provider: [name], MD    11/07/2023 -   Chief Complaint  Patient presents with   Follow-up    Pft f/u, using breztri  family denies concerns. Using 4-5 L      HPI Christian Woodard 78 y.o. -ILD and out of proportion respiratory failure hypoxemic workup in progress  Presents for follow-up with his wife and daughter/granddaughterr, they again reiterated that oxygen requirements only since May/June 2024 particularly June 2024.  He is requiring 4 to 6 L at all times.  Last time he had problems figure out his true pulse ox because of the finger probe.  This time we used a forehead probe and sure enough room air pulse ox quickly desaturated to 85%.  Even on 3 to 4 L he was 89% - 90% at rest.  He tells me no new medical issues since last visit but his sinuses have been congested and he feels like completely blocked.  In terms of his ILD and out of proportion hypoxemic respiratory failure he had autoimmune panel and serology and this is normal.  I given the ILD questionnaire to do but he has not done that.  He did have pulmonary function test his FVC is rock steady stable since 2018 through December 2024 but his DLCO has declined but only modestly from 55% to 45% over 6 years.  I did explain to him just isolated reduction in DLCO 45% typically in my clinical experience does not correlate with requiring 4 to 5 L of oxygen but indeed he  does have hypoxemia.  I again visualized the CT scan of the chest with him and his family.  I agree there is slight progression in ILD over time but again the overall burden appears very mild CAD scale to moderate.  Review of the records indicate that his last echo in summer 2024 did not show any evidence of cor  pulmonale.  Noticed that he has never had a bubble study.  Noticed he is not had a right heart catheterization.  I have not reached out to the heart failure service to consider getting a right heart catheterization.  Also ordered bubble study.  In terms of his fibrosis discussed antifibrotic's with nintedanib and pirfenidone  [see below] based on this event with pirfenidone .  Will start low-dose protocol to make sure he is not having any side effects and then slowly escalate to full dose.   Esbriet /Pirfenidone  requires intensive drug monitoring due to high concerns for Adverse effects of , including  Drug Induced Liver Injury, significant GI side effects that include but not limited to Diarrhea, Nausea, Vomiting,  and other system side effects that include Fatigue, headaches, weight loss and other side effects such as skin rash. These will be monitored with  blood work such as LFT initially once a month for 6 months and then quarterly  Nintedanib/Ofev requires intensive drug monitoring due to high concerns for Adverse effects of , including  Drug Induced Liver Injury, significant GI side effects that include but not limited to Diarrhea, Nausea, Vomiting,  and other system side effects that include Fatigue,  weight loss. Cardiac side effects are a black box warning as well. These will be monitored with  blood work such as LFT initially once a month for 6 months and then quarterly   Coronary calcification: I did have him see cardiology in [place].  He saw Dr.Mallipeddi.  He had the study on 10/30/2023.  Normal study.     OV 01/15/2024  Subjective:  Patient ID: Christian Woodard, male , DOB: 30-May-1946 , age 27 y.o. , MRN: [id number] , [address]  [address] [place] [address] PCP Artemisa Bile, MD Patient Care Team: Artemisa Bile, MD as PCP - General (Internal Medicine) Lasalle Pointer, MD as PCP - Cardiology (Cardiology) Riley Cheadle Windsor Hatcher, MD as Consulting Physician (Gastroenterology) Diamond Formica, MD as Consulting Physician (Pulmonary Disease)  This Provider for this visit: Treatment Team:  Attending Provider: [name], MD     01/15/2024 -   Chief Complaint  Patient presents with   Follow-up    Pt states has a few questions about meds.,      HPI Christian Woodard 78 y.o. -presents for follow-up with his wife and with his daughter.  Daughter is the main historian.  He attest that he is stable.  He is using 3 L oxygen at rest.  When I turned off room air he was 75% with a finger probe but with the following problems 92% room air at rest.  He is now taking pirfenidone .  He is on a low-dose protocol which is to 3 times daily.  He just got to it a few weeks ago.  He is tolerating it fine no side effects.  From his respiratory standpoint is not deteriorated.  He did have right heart catheterization.  He does have pulmonary hypertension.  Results are above.  He has been started on Tyvaso  for group 3 pulmonary hypertension but this actual start is pending orientation.  He has a drug at  home.  Is going to be done in the next few days.  If this fails they know they can enter into a study for group 3 pulmonary hypertension sponsored by [employer] note they are wondering if they should continue seeing cardiology.  He had a normal cardiac stress test.  He has completed his right heart cath.  However noticed that because of his tachycardia he did have a rhythm check and he is possible atrial tachycardia Cardizem was recommended in January 2025 by Dr. Mallipedi but he is reluctant to do it especially in the setting of lung disease.  Therefore he wants a second opinion.  I recommended Dr. Virgel Griffes and have reached out to him.  External records reviewed.   OV 05/06/2024  Subjective:  Patient ID: Christian Woodard, male , DOB: December 05, 1945 , age 47 y.o. , MRN: [id number] , AD[address]: [address]  [address] [place] [address] PCP Artemisa Bile, MD Patient Care Team: Artemisa Bile, MD as PCP - General (Internal Medicine) Lasalle Pointer, MD as PCP - Cardiology (Cardiology) Suzette Espy, MD as Consulting Physician (Gastroenterology) Diamond Formica, MD as Consulting Physician (Pulmonary Disease)  This Provider for this visit: Treatment Team:  Attending Provider: [name], MD    05/06/2024 -   Chief Complaint  Patient presents with   Follow-up    Pt had pft done this  follow up IPF and ILD , discuss change medication  family is concerns that he isn't getting right amount medication and discuss change it, still waiting on appt with cardiology     #IPF - diagnosis given Dec 2024 and reco pirfenidone  Rx snce Jan 2025 [low-dose protocol]  #Esbriet /Pirfenidone  requires intensive drug monitoring due to high concerns for Adverse effects of , including  Drug Induced Liver Injury, significant GI side effects that include but not limited to Diarrhea, Nausea, Vomiting,  and other system side effects that include Fatigue, headaches, weight loss and other side effects such as skin rash. These will be monitored with  blood work such as LFT initially once a month for 6 months and then quarterly  #WHO-3 PAHx diagnosed Dec 17, 2023  - Normal filling pressures  - Moderate Pulmonary Hypertension - PA mean 41, PCWP 6, PRVR 8.80  - Lowish CI 2.15   - started TYVASO  - date 01/17/24    HPI Christian Woodard 78 y.o. -returns for follow-up.  Presents with wife and also daughter.  Compared to the last visit in February 2025 he continues to remain the same with symptoms burden [see below] and also 5 L nasal cannula oxygen use which is stable and unchanged.  However in the interim approximately a month ago got admitted for decompensated cor pulmonale.  Was diuresed and is back to baseline.  Syncope in the bathroom preceded the admission.  Dr. Felton Hough external records reviewed and is recommending right heart cath if he does not compensate well.  He was discharged and  then he spent 2 weeks in a SNF rehab and went home on Apr 24, 2024.  Now currently appears compensated.  His home physical therapy coming.  Occupational Therapy is going to start.  Independent of this f for the last few days he is beginning to have some nasal congestion increased shortness of breath and feeling generally fatigued all the 5 L nasal cannula use is unchanged.  Daughter also had concerns about his DPI technique which I witnessed and was excellent.  I did tell him  not to get too anxious and prepare for it but just to put it in the mouth and inhaler.  The mist did come out of the device and have requested some clarification from [employer].  Have also asked him to reach out to his [employer] nurse to address this.        SYMPTOM SCALE - ILD 11/07/2023 - IPF dx 01/15/2024  05/06/2024   Current weight   Esbriet  and also Tyvaso .  O2 use ra 3L at res but is 92% RA at rest with the forehead probe 75% with a finger probe   Shortness of Breath 0 -> 5 scale with 5 being worst (score 6 If unable to do)    At rest 2 2 1   Simple tasks - showers, clothes change, eating, shaving 5 5 4   Household (dishes, doing bed, laundry) 3 3 5   Shopping 4 5 5   Walking level at own pace 3 3 3   Walking up Stairs 4 5 5   Total (30-36) Dyspnea Score 21 23 23   How bad is your cough? 1 1 0  How bad is your fatigue 1 2 2   How bad is nausea 00 0 0  How bad is vomiting?  0 0 00  How bad is diarrhea? 00 0 0  How bad is anxiety? 0 0 00  How bad is depression 0 0 000  Any chronic pain - if so where and how bad x 0 0    PFT     Latest Ref Rng & Units 05/06/2024    9:07 AM 11/07/2023    1:54 PM 01/31/2021    1:28 PM 08/20/2017    8:36 AM  PFT Results  FVC-Pre L 2.41  P 2.89  2.87  2.92   FVC-Predicted Pre % 75  P 90  81  80   FVC-Post L   2.97  2.93   FVC-Predicted Post %   84  80   Pre FEV1/FVC % % 74  P 76  73  75   Post FEV1/FCV % %   75  76   FEV1-Pre L 1.77  P 2.19  2.10  2.18    FEV1-Predicted Pre % 79  P 97  83  82   FEV1-Post L   2.23  2.23   DLCO uncorrected ml/min/mmHg 4.97  P 8.79  10.30  11.95   DLCO UNC% % 24  P 42  47  45   DLCO corrected ml/min/mmHg 4.98  P 9.26  63.39  14.67   DLCO COR %Predicted % 24  P 45  290  55   DLVA Predicted % 33  P 54  337  78   TLC L   5.16  4.82   TLC % Predicted %   84  78   RV % Predicted %   89  86     P Preliminary result       LAB RESULTS last 96 hours No results found.       has a past medical history of Arthritis, Barrett's esophagus, Diabetes mellitus, GERD (gastroesophageal reflux disease), and Hypercholesteremia.   reports that he quit smoking about 25 years ago. His smoking use included cigarettes. He started smoking about 61 years ago. He has a 72 pack-year smoking history. He has never used smokeless tobacco.  Past Surgical History:  Procedure Laterality Date   BACK SURGERY  2000   CHOLECYSTECTOMY     COLONOSCOPY  09/15/2011   Dr. Riley Cheadle:  Suboptimal prep. Pancolonic diverticulosis. Tubular adenomas. Next colonoscopy 5 years.   COLONOSCOPY N/A 09/22/2016   Surgeon: Suzette Espy, MD; pancolonic diverticulosis, otherwise normal exam.  Recommended repeat in 5 years.   COLONOSCOPY WITH PROPOFOL  N/A 09/29/2021   Procedure: COLONOSCOPY WITH PROPOFOL ;  Surgeon: Suzette Espy, MD;  [facility];  Service: Endoscopy;  Laterality: N/A;  9:00am   ESOPHAGOGASTRODUODENOSCOPY N/A 08/31/2017   Surgeon: Suzette Espy, MD;  Barrett's esophagus with low-grade dysplasia, small hiatal hernia with abnormal gastric mucosa biopsied (no H. pylori), normal examined duodenum.     ESOPHAGOGASTRODUODENOSCOPY  11/2018   [facility]; Barrett's esophagus, negative for dysplasia   ESOPHAGOGASTRODUODENOSCOPY  01/2020   [facility]; Barrett's esophagus, indefinite for focal low-grade dysplasia.   GIVENS CAPSULE STUDY N/A 10/26/2017   Surgeon: Suzette Espy, MD; localized clustered areas of white "blunted appearing" abnormal  small bowel mucosa/villi of uncertain significance.  Follow-up CTE and small bowel enteroscopy at [facility] without significant findings.    GIVENS CAPSULE STUDY N/A 05/15/2023   Procedure: GIVENS CAPSULE STUDY;  Surgeon: Suzette Espy, MD;  Location: [facility];  Service: Endoscopy;  Laterality: N/A;  730am   HERNIA REPAIR     as a child   left cornea transplant Left    POLYPECTOMY  09/29/2021   Procedure: POLYPECTOMY;  Surgeon: Suzette Espy, MD;  Location: [facility];  Service: Endoscopy;;   Right cornea transplant     RIGHT HEART CATH N/A 12/17/2023   Procedure: RIGHT HEART CATH;  Surgeon: Darlis Eisenmenger, MD;  Location: [facility] INVASIVE CV LAB;  Service: Cardiovascular;  Laterality: N/A;   SMALL BOWEL ENTEROSCOPY  11/2017   [facility]; Barrett's esophagus focally positive for low-grade dysplasia, normal stomach, normal duodenum, normal jejunum.  Small bowel biopsies were benign.   TONSILLECTOMY      No Known Allergies  Immunization History  Administered Date(s) Administered   Fluad Quad(high Dose 65+) 10/07/2019, 11/22/2021   Influenza-Unspecified 10/29/2017, 08/20/2023   Moderna SARS-COV2 Booster Vaccination 01/10/2021   Moderna Sars-Covid-2 Vaccination 02/26/2020, 03/30/2020   Tdap 01/23/2017    Family History  Problem Relation Age of Onset   Heart disease Mother    Heart disease Father    Emphysema Father        smoked   Lung cancer Father        smoked   Colon cancer Neg Hx      Current Outpatient Medications:    acetaminophen  (TYLENOL ) 325 MG tablet, Take 2 tablets (650 mg total) by mouth every 6 (six) hours as needed for mild pain (pain score 1-3) (or Fever >/= 101)., Disp: , Rfl:    aspirin  EC 81 MG tablet, Take 1 tablet (81 mg total) by mouth daily with breakfast. Swallow whole., Disp: 30 tablet, Rfl: 11   atorvastatin  (LIPITOR) 10 MG tablet, Take 10 mg by mouth at bedtime., Disp: , Rfl:    cyanocobalamin  (VITAMIN B12) 1000 MCG tablet, Take 1,000 mcg by mouth daily.,  Disp: , Rfl:    famotidine  (PEPCID ) 20 MG tablet, Take 1 tablet (20 mg total) by mouth daily., Disp: 30 tablet, Rfl: 3   Ferrous Gluconate  (IRON  27 PO), Take 1 tablet by mouth daily., Disp: , Rfl:    furosemide  (LASIX ) 40 MG tablet, Take 1 tablet (40 mg total) by mouth daily., Disp: 30 tablet, Rfl: 2   gabapentin  (NEURONTIN ) 300 MG capsule, Take 1 capsule (300 mg total) by mouth 2 (two) times daily., Disp: , Rfl:  glipiZIDE  (GLUCOTROL  XL) 5 MG 24 hr tablet, Take 5 mg by mouth daily with breakfast., Disp: , Rfl:    IYUZEH  0.005 % SOLN, Apply 1 drop to eye at bedtime., Disp: , Rfl:    LANTUS  SOLOSTAR 100 UNIT/ML Solostar Pen, Inject 20 Units into the skin 2 (two) times daily., Disp: 15 mL, Rfl: 11   losartan  (COZAAR ) 25 MG tablet, Take 1 tablet (25 mg total) by mouth daily., Disp: 30 tablet, Rfl: 3   metFORMIN  (GLUCOPHAGE ) 500 MG tablet, Take 500 mg by mouth 2 (two) times daily., Disp: , Rfl:    NOVOLOG  FLEXPEN 100 UNIT/ML FlexPen, Inject 0-12 Units into the skin See admin instructions. Short acting insulin  per sliding scale 0-10 ---:-- Insulin  injection 0-10 Units 0-10 Units Subcutaneous, 3 times daily with meals CBG 70 - 120: 0 unit CBG 121 - 150: 0 unit  CBG 151 - 200: 2 unit CBG 201 - 250: 4 units CBG 251 - 300: 6 units CBG 301 - 350: 8 units  CBG 351 - 400: 10 units  CBG > 400: 12 units, Disp: 15 mL, Rfl: 11   pantoprazole  (PROTONIX ) 40 MG tablet, TAKE 1 TABLET BY MOUTH ONE TIME DAILY 30-60 MINUTES BEFORE BREAKFAST, Disp: 90 tablet, Rfl: 3   Pirfenidone  267 MG TABS, Take 2 tablets (534 mg total) by mouth in the morning, at noon, and at bedtime. **low dose as maintenance**, Disp: 180 tablet, Rfl: 2   potassium chloride  (MICRO-K ) 10 MEQ CR capsule, Take 10 mEq by mouth daily., Disp: , Rfl:    predniSONE  (DELTASONE ) 10 MG tablet, Please take prednisone  40 mg x1 day, then 30 mg x1 day, then 20 mg x1 day, then 10 mg x1 day, and then 5 mg x1 day and stop, Disp: 11 tablet, Rfl: 0   senna-docusate  (SENOKOT-S) 8.6-50 MG tablet, Take 2 tablets by mouth at bedtime., Disp: 60 tablet, Rfl: 1   TYVASO  DPI MAINTENANCE KIT 64 MCG POWD, Inhale into the lungs., Disp: , Rfl:       Objective:   Vitals:   05/06/24 0950  BP: (!) 92/58  Pulse: 61  SpO2: 96%  Weight: 160 lb (72.6 kg)  Height: 5\' 6"  (1.676 m)    Estimated body mass index is 25.82 kg/m as calculated from the following:   Height as of this encounter: 5\' 6"  (1.676 m).   Weight as of this encounter: 160 lb (72.6 kg).  @WEIGHTCHANGE @  [employer]   05/06/24 0950  Weight: 160 lb (72.6 kg)     Physical Exam   General: No distress. Looks well but sitting on whee chair O2 at rest: YES Cane present: no Sitting in wheel chair: YES Frail: no Obese: no Neuro: Alert and Oriented x 3. GCS 15. Speech normal Psych: Pleasant Resp:  Barrel Chest - no.  Wheeze - no, Crackles - YES, No overt respiratory distress CVS: Normal heart sounds. Murmurs - no Ext: Stigmata of Connective Tissue Disease - no HEENT: Normal upper airway. PEERL +. No post nasal drip        Assessment:       ICD-10-CM   1. IPF (idiopathic pulmonary fibrosis) (HCC)  U98.119 Pulmonary function test    2. WHO group 3 pulmonary arterial hypertension (HCC)  I27.23 Pulmonary function test    3. Chronic respiratory failure with hypoxia (HCC)  J96.11 Pulmonary function test    4. High risk medication use  Z79.899 Pulmonary function test    5. Acute bronchitis, unspecified organism  J20.9          Plan:     Patient Instructions     ICD-10-CM   1. IPF (idiopathic pulmonary fibrosis) (HCC)  J84.112 Hepatic function panel    B Nat Peptide    2. WHO group 3 pulmonary arterial hypertension (HCC)  I27.23 Hepatic function panel    B Nat Peptide    3. Chronic respiratory failure with hypoxia (HCC)  J96.11 Hepatic function panel    B Nat Peptide    4. High risk medication use  Z79.899 Hepatic function panel    B Nat Peptide    5. Encounter for  therapeutic drug monitoring  Z51.81 Hepatic function panel    B Nat Peptide      Chronic hypoxemic respiratory failure  -This is because of a combination of pulmonary fibrosis IPF and also pulmonary hypertension diagnosed January 2025 - Clinically more stable after decompensation because of cor pulmonale in mid May 2025.  But overall the disease appears to be progressive   Plan - Continue oxygen to keep pulse ox goal greater than 88%  -- Use 3-5 L of oxygen - Glad you are going through home physical therapy - Check blood BNP today  Acute bronchtiis  - You might be developing this  Plan  - Please take prednisone  40 mg x1 day, then 30 mg x1 day, then 20 mg x1 day, then 10 mg x1 day, and then 5 mg x1 day and stop   ILD/IPF -IPF diagnosis given December 2024 High risk prescription Encounter for therapeutic monitoring  -Glad you are tolerating low-dose pirfenidone  protocol well since January 2025 - Liver function test May 2025 is normal   PLAN -Continue Esbriet  2 pills 3 times daily with food and please to apply sunscreen when you go out -Check liver function test today every 3 months  WHO group 3 pulmonary hypertension =-diagnosed January 2025  -Tolerating Tyvaso  DPI at full dose but noted some distress, out of the device -Technique appears good - Appears more well compensated  Plan  - Continue inhaled Tyvaso  DPI per protocol at full dose - Checking with manufacture about the mist coming out of the device   - call the nurse  - calso change out device  - if these do not work will do nebulizer - Depending on course consider repeat right heart catheterization  22% PAC burden, 1710 runs of likely atrial tachycardia with variable AV block and 1 run of NSVT lasting 4 beats - Jan 2025 noticed by Dr Mallipeddi  -Noted cardiac stress test was normal    Plan Await new cardiology opinion  Followup -8 weeks 30-minute visit with Dr. Bertrum Brodie but after spirometry and  DLCO  - Symptom score and BNP check at follow-up   FOLLOWUP Return in about 9 weeks (around 07/08/2024) for 30 min visit, with Dr Bertrum Brodie, after Spiro and DLCO, Face to Face Visit.    SIGNATURE    Dr. [name], M.D., F.C.C.P,  Pulmonary and Critical Care Medicine Staff Physician, [facility] Center Director - Interstitial Lung Disease  Program  Pulmonary Fibrosis [facility] at [facility] [place], [place], [address]  Pager: [phone], If no answer or between  15:00h - 7:00h: call [phone] Telephone: [phone]  10:32 AM 05/06/2024

## 2024-05-06 NOTE — Progress Notes (Signed)
 Specialty Pharmacy Refill Coordination Note  ERSEL ENSLIN is a 78 y.o. male contacted today regarding refills of specialty medication(s) Pirfenidone    Patient requested Delivery   Delivery date: 05/08/24   Verified address: [address] [place]   Medication will be filled on 05/07/24.

## 2024-05-06 NOTE — Patient Instructions (Signed)
Spiro/DLCO performed today. 

## 2024-05-06 NOTE — Patient Instructions (Addendum)
 ICD-10-CM   1. IPF (idiopathic pulmonary fibrosis) (HCC)  J84.112 Hepatic function panel    B Nat Peptide    2. WHO group 3 pulmonary arterial hypertension (HCC)  I27.23 Hepatic function panel    B Nat Peptide    3. Chronic respiratory failure with hypoxia (HCC)  J96.11 Hepatic function panel    B Nat Peptide    4. High risk medication use  Z79.899 Hepatic function panel    B Nat Peptide    5. Encounter for therapeutic drug monitoring  Z51.81 Hepatic function panel    B Nat Peptide      Chronic hypoxemic respiratory failure  -This is because of a combination of pulmonary fibrosis IPF and also pulmonary hypertension diagnosed January 2025 - Clinically more stable after decompensation because of cor pulmonale in mid May 2025.  But overall the disease appears to be progressive   Plan - Continue oxygen to keep pulse ox goal greater than 88%  -- Use 3-5 L of oxygen - Glad you are going through home physical therapy - Check blood BNP today  Acute bronchtiis  - You might be developing this  Plan  - Please take prednisone  40 mg x1 day, then 30 mg x1 day, then 20 mg x1 day, then 10 mg x1 day, and then 5 mg x1 day and stop   ILD/IPF -IPF diagnosis given December 2024 High risk prescription Encounter for therapeutic monitoring  -Glad you are tolerating low-dose pirfenidone  protocol well since January 2025 - Liver function test May 2025 is normal   PLAN -Continue Esbriet  2 pills 3 times daily with food and please to apply sunscreen when you go out -Check liver function test today every 3 months  WHO group 3 pulmonary hypertension =-diagnosed January 2025  -Tolerating Tyvaso  DPI at full dose but noted some distress, out of the device -Technique appears good - Appears more well compensated  Plan  - Continue inhaled Tyvaso  DPI per protocol at full dose - Checking with manufacture about the mist coming out of the device   - call the nurse  - calso change out device  -  if these do not work will do nebulizer - Depending on course consider repeat right heart catheterization  22% PAC burden, 1710 runs of likely atrial tachycardia with variable AV block and 1 run of NSVT lasting 4 beats - Jan 2025 noticed by Dr Mallipeddi  -Noted cardiac stress test was normal    Plan Await new cardiology opinion  Followup -8 weeks 30-minute visit with Dr. Bertrum Brodie but after spirometry and DLCO  - Symptom score and BNP check at follow-up

## 2024-05-07 ENCOUNTER — Telehealth: Payer: Self-pay

## 2024-05-07 DIAGNOSIS — J8410 Pulmonary fibrosis, unspecified: Secondary | ICD-10-CM | POA: Diagnosis not present

## 2024-05-07 DIAGNOSIS — G40909 Epilepsy, unspecified, not intractable, without status epilepticus: Secondary | ICD-10-CM | POA: Diagnosis not present

## 2024-05-07 DIAGNOSIS — J9621 Acute and chronic respiratory failure with hypoxia: Secondary | ICD-10-CM | POA: Diagnosis not present

## 2024-05-07 DIAGNOSIS — N1831 Chronic kidney disease, stage 3a: Secondary | ICD-10-CM | POA: Diagnosis not present

## 2024-05-07 DIAGNOSIS — G35 Multiple sclerosis: Secondary | ICD-10-CM | POA: Diagnosis not present

## 2024-05-07 DIAGNOSIS — I5033 Acute on chronic diastolic (congestive) heart failure: Secondary | ICD-10-CM | POA: Diagnosis not present

## 2024-05-07 DIAGNOSIS — L03116 Cellulitis of left lower limb: Secondary | ICD-10-CM | POA: Diagnosis not present

## 2024-05-07 DIAGNOSIS — I272 Pulmonary hypertension, unspecified: Secondary | ICD-10-CM | POA: Diagnosis not present

## 2024-05-07 DIAGNOSIS — E1122 Type 2 diabetes mellitus with diabetic chronic kidney disease: Secondary | ICD-10-CM | POA: Diagnosis not present

## 2024-05-07 NOTE — Telephone Encounter (Signed)
 Copied from CRM [id number]. Topic: Clinical - Request for Lab/Test Order >> May 06, 2024 11:47 AM Christian Woodard wrote: Reason for CRM: Patient's grandchild Christian Woodard [phone] or [phone] states just left the office with Dr. Bertrum Brodie and did not see that patient need to have liver function lab to be done. Christian Woodard states patient lives an hour away and is asking if lab order can be done locally, like [facility]. Please advise and call back.  Spoke with patient's wife  Christian Woodard regarding prior message . Patient was scheduled for 07/22/2024 for a 8 week f/u will have Symptom score and BNP check at follow-up. Christian Woodard's voice was understanding.Nothing else further needed.

## 2024-05-08 DIAGNOSIS — J8410 Pulmonary fibrosis, unspecified: Secondary | ICD-10-CM | POA: Diagnosis not present

## 2024-05-08 DIAGNOSIS — L03116 Cellulitis of left lower limb: Secondary | ICD-10-CM | POA: Diagnosis not present

## 2024-05-08 DIAGNOSIS — I5033 Acute on chronic diastolic (congestive) heart failure: Secondary | ICD-10-CM | POA: Diagnosis not present

## 2024-05-08 DIAGNOSIS — I272 Pulmonary hypertension, unspecified: Secondary | ICD-10-CM | POA: Diagnosis not present

## 2024-05-08 DIAGNOSIS — G40909 Epilepsy, unspecified, not intractable, without status epilepticus: Secondary | ICD-10-CM | POA: Diagnosis not present

## 2024-05-08 DIAGNOSIS — J9621 Acute and chronic respiratory failure with hypoxia: Secondary | ICD-10-CM | POA: Diagnosis not present

## 2024-05-08 DIAGNOSIS — E1122 Type 2 diabetes mellitus with diabetic chronic kidney disease: Secondary | ICD-10-CM | POA: Diagnosis not present

## 2024-05-08 DIAGNOSIS — G35 Multiple sclerosis: Secondary | ICD-10-CM | POA: Diagnosis not present

## 2024-05-08 DIAGNOSIS — N1831 Chronic kidney disease, stage 3a: Secondary | ICD-10-CM | POA: Diagnosis not present

## 2024-05-12 DIAGNOSIS — I5033 Acute on chronic diastolic (congestive) heart failure: Secondary | ICD-10-CM | POA: Diagnosis not present

## 2024-05-12 DIAGNOSIS — J8410 Pulmonary fibrosis, unspecified: Secondary | ICD-10-CM | POA: Diagnosis not present

## 2024-05-12 DIAGNOSIS — N1831 Chronic kidney disease, stage 3a: Secondary | ICD-10-CM | POA: Diagnosis not present

## 2024-05-12 DIAGNOSIS — I272 Pulmonary hypertension, unspecified: Secondary | ICD-10-CM | POA: Diagnosis not present

## 2024-05-12 DIAGNOSIS — G40909 Epilepsy, unspecified, not intractable, without status epilepticus: Secondary | ICD-10-CM | POA: Diagnosis not present

## 2024-05-12 DIAGNOSIS — L03116 Cellulitis of left lower limb: Secondary | ICD-10-CM | POA: Diagnosis not present

## 2024-05-12 DIAGNOSIS — G35 Multiple sclerosis: Secondary | ICD-10-CM | POA: Diagnosis not present

## 2024-05-12 DIAGNOSIS — J9621 Acute and chronic respiratory failure with hypoxia: Secondary | ICD-10-CM | POA: Diagnosis not present

## 2024-05-12 DIAGNOSIS — E1122 Type 2 diabetes mellitus with diabetic chronic kidney disease: Secondary | ICD-10-CM | POA: Diagnosis not present

## 2024-05-13 DIAGNOSIS — G35 Multiple sclerosis: Secondary | ICD-10-CM | POA: Diagnosis not present

## 2024-05-13 DIAGNOSIS — J8410 Pulmonary fibrosis, unspecified: Secondary | ICD-10-CM | POA: Diagnosis not present

## 2024-05-13 DIAGNOSIS — I272 Pulmonary hypertension, unspecified: Secondary | ICD-10-CM | POA: Diagnosis not present

## 2024-05-13 DIAGNOSIS — L03116 Cellulitis of left lower limb: Secondary | ICD-10-CM | POA: Diagnosis not present

## 2024-05-13 DIAGNOSIS — N1831 Chronic kidney disease, stage 3a: Secondary | ICD-10-CM | POA: Diagnosis not present

## 2024-05-13 DIAGNOSIS — J9621 Acute and chronic respiratory failure with hypoxia: Secondary | ICD-10-CM | POA: Diagnosis not present

## 2024-05-13 DIAGNOSIS — I5033 Acute on chronic diastolic (congestive) heart failure: Secondary | ICD-10-CM | POA: Diagnosis not present

## 2024-05-13 DIAGNOSIS — E1122 Type 2 diabetes mellitus with diabetic chronic kidney disease: Secondary | ICD-10-CM | POA: Diagnosis not present

## 2024-05-13 DIAGNOSIS — G40909 Epilepsy, unspecified, not intractable, without status epilepticus: Secondary | ICD-10-CM | POA: Diagnosis not present

## 2024-05-14 DIAGNOSIS — L03116 Cellulitis of left lower limb: Secondary | ICD-10-CM | POA: Diagnosis not present

## 2024-05-14 DIAGNOSIS — J8410 Pulmonary fibrosis, unspecified: Secondary | ICD-10-CM | POA: Diagnosis not present

## 2024-05-14 DIAGNOSIS — N1831 Chronic kidney disease, stage 3a: Secondary | ICD-10-CM | POA: Diagnosis not present

## 2024-05-14 DIAGNOSIS — J9621 Acute and chronic respiratory failure with hypoxia: Secondary | ICD-10-CM | POA: Diagnosis not present

## 2024-05-14 DIAGNOSIS — I272 Pulmonary hypertension, unspecified: Secondary | ICD-10-CM | POA: Diagnosis not present

## 2024-05-14 DIAGNOSIS — G40909 Epilepsy, unspecified, not intractable, without status epilepticus: Secondary | ICD-10-CM | POA: Diagnosis not present

## 2024-05-14 DIAGNOSIS — I5033 Acute on chronic diastolic (congestive) heart failure: Secondary | ICD-10-CM | POA: Diagnosis not present

## 2024-05-14 DIAGNOSIS — G35 Multiple sclerosis: Secondary | ICD-10-CM | POA: Diagnosis not present

## 2024-05-14 DIAGNOSIS — E1122 Type 2 diabetes mellitus with diabetic chronic kidney disease: Secondary | ICD-10-CM | POA: Diagnosis not present

## 2024-05-19 DIAGNOSIS — J9621 Acute and chronic respiratory failure with hypoxia: Secondary | ICD-10-CM | POA: Diagnosis not present

## 2024-05-19 DIAGNOSIS — N1831 Chronic kidney disease, stage 3a: Secondary | ICD-10-CM | POA: Diagnosis not present

## 2024-05-19 DIAGNOSIS — G40909 Epilepsy, unspecified, not intractable, without status epilepticus: Secondary | ICD-10-CM | POA: Diagnosis not present

## 2024-05-19 DIAGNOSIS — E1122 Type 2 diabetes mellitus with diabetic chronic kidney disease: Secondary | ICD-10-CM | POA: Diagnosis not present

## 2024-05-19 DIAGNOSIS — I272 Pulmonary hypertension, unspecified: Secondary | ICD-10-CM | POA: Diagnosis not present

## 2024-05-19 DIAGNOSIS — G35 Multiple sclerosis: Secondary | ICD-10-CM | POA: Diagnosis not present

## 2024-05-19 DIAGNOSIS — I5033 Acute on chronic diastolic (congestive) heart failure: Secondary | ICD-10-CM | POA: Diagnosis not present

## 2024-05-19 DIAGNOSIS — J8410 Pulmonary fibrosis, unspecified: Secondary | ICD-10-CM | POA: Diagnosis not present

## 2024-05-19 DIAGNOSIS — L03116 Cellulitis of left lower limb: Secondary | ICD-10-CM | POA: Diagnosis not present

## 2024-05-20 DIAGNOSIS — J9621 Acute and chronic respiratory failure with hypoxia: Secondary | ICD-10-CM | POA: Diagnosis not present

## 2024-05-20 DIAGNOSIS — G35 Multiple sclerosis: Secondary | ICD-10-CM | POA: Diagnosis not present

## 2024-05-20 DIAGNOSIS — G40909 Epilepsy, unspecified, not intractable, without status epilepticus: Secondary | ICD-10-CM | POA: Diagnosis not present

## 2024-05-20 DIAGNOSIS — J8410 Pulmonary fibrosis, unspecified: Secondary | ICD-10-CM | POA: Diagnosis not present

## 2024-05-20 DIAGNOSIS — L03116 Cellulitis of left lower limb: Secondary | ICD-10-CM | POA: Diagnosis not present

## 2024-05-20 DIAGNOSIS — E1122 Type 2 diabetes mellitus with diabetic chronic kidney disease: Secondary | ICD-10-CM | POA: Diagnosis not present

## 2024-05-20 DIAGNOSIS — I272 Pulmonary hypertension, unspecified: Secondary | ICD-10-CM | POA: Diagnosis not present

## 2024-05-20 DIAGNOSIS — I5033 Acute on chronic diastolic (congestive) heart failure: Secondary | ICD-10-CM | POA: Diagnosis not present

## 2024-05-20 DIAGNOSIS — N1831 Chronic kidney disease, stage 3a: Secondary | ICD-10-CM | POA: Diagnosis not present

## 2024-05-21 DIAGNOSIS — N1831 Chronic kidney disease, stage 3a: Secondary | ICD-10-CM | POA: Diagnosis not present

## 2024-05-21 DIAGNOSIS — J8410 Pulmonary fibrosis, unspecified: Secondary | ICD-10-CM | POA: Diagnosis not present

## 2024-05-21 DIAGNOSIS — G40909 Epilepsy, unspecified, not intractable, without status epilepticus: Secondary | ICD-10-CM | POA: Diagnosis not present

## 2024-05-21 DIAGNOSIS — G35 Multiple sclerosis: Secondary | ICD-10-CM | POA: Diagnosis not present

## 2024-05-21 DIAGNOSIS — I272 Pulmonary hypertension, unspecified: Secondary | ICD-10-CM | POA: Diagnosis not present

## 2024-05-21 DIAGNOSIS — L03116 Cellulitis of left lower limb: Secondary | ICD-10-CM | POA: Diagnosis not present

## 2024-05-21 DIAGNOSIS — I5033 Acute on chronic diastolic (congestive) heart failure: Secondary | ICD-10-CM | POA: Diagnosis not present

## 2024-05-21 DIAGNOSIS — E1122 Type 2 diabetes mellitus with diabetic chronic kidney disease: Secondary | ICD-10-CM | POA: Diagnosis not present

## 2024-05-21 DIAGNOSIS — J9621 Acute and chronic respiratory failure with hypoxia: Secondary | ICD-10-CM | POA: Diagnosis not present

## 2024-05-26 DIAGNOSIS — N1831 Chronic kidney disease, stage 3a: Secondary | ICD-10-CM | POA: Diagnosis not present

## 2024-05-26 DIAGNOSIS — G35 Multiple sclerosis: Secondary | ICD-10-CM | POA: Diagnosis not present

## 2024-05-26 DIAGNOSIS — L03116 Cellulitis of left lower limb: Secondary | ICD-10-CM | POA: Diagnosis not present

## 2024-05-26 DIAGNOSIS — G40909 Epilepsy, unspecified, not intractable, without status epilepticus: Secondary | ICD-10-CM | POA: Diagnosis not present

## 2024-05-26 DIAGNOSIS — J8410 Pulmonary fibrosis, unspecified: Secondary | ICD-10-CM | POA: Diagnosis not present

## 2024-05-26 DIAGNOSIS — I5033 Acute on chronic diastolic (congestive) heart failure: Secondary | ICD-10-CM | POA: Diagnosis not present

## 2024-05-26 DIAGNOSIS — E1122 Type 2 diabetes mellitus with diabetic chronic kidney disease: Secondary | ICD-10-CM | POA: Diagnosis not present

## 2024-05-26 DIAGNOSIS — I272 Pulmonary hypertension, unspecified: Secondary | ICD-10-CM | POA: Diagnosis not present

## 2024-05-26 DIAGNOSIS — J9621 Acute and chronic respiratory failure with hypoxia: Secondary | ICD-10-CM | POA: Diagnosis not present

## 2024-05-27 DIAGNOSIS — I5033 Acute on chronic diastolic (congestive) heart failure: Secondary | ICD-10-CM | POA: Diagnosis not present

## 2024-05-27 DIAGNOSIS — J8410 Pulmonary fibrosis, unspecified: Secondary | ICD-10-CM | POA: Diagnosis not present

## 2024-05-27 DIAGNOSIS — E1122 Type 2 diabetes mellitus with diabetic chronic kidney disease: Secondary | ICD-10-CM | POA: Diagnosis not present

## 2024-05-27 DIAGNOSIS — G35 Multiple sclerosis: Secondary | ICD-10-CM | POA: Diagnosis not present

## 2024-05-27 DIAGNOSIS — G40909 Epilepsy, unspecified, not intractable, without status epilepticus: Secondary | ICD-10-CM | POA: Diagnosis not present

## 2024-05-27 DIAGNOSIS — N1831 Chronic kidney disease, stage 3a: Secondary | ICD-10-CM | POA: Diagnosis not present

## 2024-05-27 DIAGNOSIS — J9621 Acute and chronic respiratory failure with hypoxia: Secondary | ICD-10-CM | POA: Diagnosis not present

## 2024-05-27 DIAGNOSIS — I272 Pulmonary hypertension, unspecified: Secondary | ICD-10-CM | POA: Diagnosis not present

## 2024-05-27 DIAGNOSIS — L03116 Cellulitis of left lower limb: Secondary | ICD-10-CM | POA: Diagnosis not present

## 2024-05-29 DIAGNOSIS — J8410 Pulmonary fibrosis, unspecified: Secondary | ICD-10-CM | POA: Diagnosis not present

## 2024-05-29 DIAGNOSIS — G35 Multiple sclerosis: Secondary | ICD-10-CM | POA: Diagnosis not present

## 2024-05-29 DIAGNOSIS — I5033 Acute on chronic diastolic (congestive) heart failure: Secondary | ICD-10-CM | POA: Diagnosis not present

## 2024-05-29 DIAGNOSIS — J9621 Acute and chronic respiratory failure with hypoxia: Secondary | ICD-10-CM | POA: Diagnosis not present

## 2024-05-29 DIAGNOSIS — I272 Pulmonary hypertension, unspecified: Secondary | ICD-10-CM | POA: Diagnosis not present

## 2024-05-29 DIAGNOSIS — L03116 Cellulitis of left lower limb: Secondary | ICD-10-CM | POA: Diagnosis not present

## 2024-05-29 DIAGNOSIS — E1122 Type 2 diabetes mellitus with diabetic chronic kidney disease: Secondary | ICD-10-CM | POA: Diagnosis not present

## 2024-05-29 DIAGNOSIS — G40909 Epilepsy, unspecified, not intractable, without status epilepticus: Secondary | ICD-10-CM | POA: Diagnosis not present

## 2024-05-29 DIAGNOSIS — N1831 Chronic kidney disease, stage 3a: Secondary | ICD-10-CM | POA: Diagnosis not present

## 2024-06-02 DIAGNOSIS — J9611 Chronic respiratory failure with hypoxia: Secondary | ICD-10-CM | POA: Diagnosis not present

## 2024-06-02 DIAGNOSIS — E1122 Type 2 diabetes mellitus with diabetic chronic kidney disease: Secondary | ICD-10-CM | POA: Diagnosis not present

## 2024-06-02 DIAGNOSIS — Z79899 Other long term (current) drug therapy: Secondary | ICD-10-CM | POA: Diagnosis not present

## 2024-06-02 DIAGNOSIS — E1129 Type 2 diabetes mellitus with other diabetic kidney complication: Secondary | ICD-10-CM | POA: Diagnosis not present

## 2024-06-02 DIAGNOSIS — J9621 Acute and chronic respiratory failure with hypoxia: Secondary | ICD-10-CM | POA: Diagnosis not present

## 2024-06-02 DIAGNOSIS — G40909 Epilepsy, unspecified, not intractable, without status epilepticus: Secondary | ICD-10-CM | POA: Diagnosis not present

## 2024-06-02 DIAGNOSIS — L03116 Cellulitis of left lower limb: Secondary | ICD-10-CM | POA: Diagnosis not present

## 2024-06-02 DIAGNOSIS — I5033 Acute on chronic diastolic (congestive) heart failure: Secondary | ICD-10-CM | POA: Diagnosis not present

## 2024-06-02 DIAGNOSIS — I272 Pulmonary hypertension, unspecified: Secondary | ICD-10-CM | POA: Diagnosis not present

## 2024-06-02 DIAGNOSIS — N1831 Chronic kidney disease, stage 3a: Secondary | ICD-10-CM | POA: Diagnosis not present

## 2024-06-02 DIAGNOSIS — J8410 Pulmonary fibrosis, unspecified: Secondary | ICD-10-CM | POA: Diagnosis not present

## 2024-06-02 DIAGNOSIS — G35 Multiple sclerosis: Secondary | ICD-10-CM | POA: Diagnosis not present

## 2024-06-03 ENCOUNTER — Other Ambulatory Visit (HOSPITAL_COMMUNITY): Payer: Self-pay

## 2024-06-03 ENCOUNTER — Encounter (HOSPITAL_COMMUNITY): Payer: Self-pay

## 2024-06-05 ENCOUNTER — Other Ambulatory Visit: Payer: Self-pay

## 2024-06-05 ENCOUNTER — Other Ambulatory Visit (HOSPITAL_COMMUNITY): Payer: Self-pay

## 2024-06-05 NOTE — Progress Notes (Signed)
 Specialty Pharmacy Refill Coordination Note  Christian Woodard is a 78 y.o. male contacted today regarding refills of specialty medication(s) Pirfenidone    Patient requested Delivery   Delivery date: 06/06/24   Verified address: [address] [place]   Medication will be filled on 06/05/24.

## 2024-06-06 ENCOUNTER — Other Ambulatory Visit: Payer: Self-pay

## 2024-06-06 DIAGNOSIS — J9621 Acute and chronic respiratory failure with hypoxia: Secondary | ICD-10-CM | POA: Diagnosis not present

## 2024-06-06 DIAGNOSIS — J8410 Pulmonary fibrosis, unspecified: Secondary | ICD-10-CM | POA: Diagnosis not present

## 2024-06-06 DIAGNOSIS — G40909 Epilepsy, unspecified, not intractable, without status epilepticus: Secondary | ICD-10-CM | POA: Diagnosis not present

## 2024-06-06 DIAGNOSIS — E1122 Type 2 diabetes mellitus with diabetic chronic kidney disease: Secondary | ICD-10-CM | POA: Diagnosis not present

## 2024-06-06 DIAGNOSIS — G35 Multiple sclerosis: Secondary | ICD-10-CM | POA: Diagnosis not present

## 2024-06-06 DIAGNOSIS — L03116 Cellulitis of left lower limb: Secondary | ICD-10-CM | POA: Diagnosis not present

## 2024-06-06 DIAGNOSIS — I272 Pulmonary hypertension, unspecified: Secondary | ICD-10-CM | POA: Diagnosis not present

## 2024-06-06 DIAGNOSIS — N1831 Chronic kidney disease, stage 3a: Secondary | ICD-10-CM | POA: Diagnosis not present

## 2024-06-06 DIAGNOSIS — I5033 Acute on chronic diastolic (congestive) heart failure: Secondary | ICD-10-CM | POA: Diagnosis not present

## 2024-06-09 ENCOUNTER — Other Ambulatory Visit: Payer: Self-pay

## 2024-06-09 ENCOUNTER — Encounter (HOSPITAL_COMMUNITY): Payer: Self-pay | Admitting: Emergency Medicine

## 2024-06-09 ENCOUNTER — Observation Stay (HOSPITAL_COMMUNITY)
Admission: EM | Admit: 2024-06-09 | Discharge: 2024-06-10 | Disposition: A | Discharge status: Home Health | Attending: Internal Medicine | Admitting: Internal Medicine

## 2024-06-09 ENCOUNTER — Emergency Department (HOSPITAL_COMMUNITY)

## 2024-06-09 DIAGNOSIS — Z7982 Long term (current) use of aspirin: Secondary | ICD-10-CM | POA: Diagnosis not present

## 2024-06-09 DIAGNOSIS — I2723 Pulmonary hypertension due to lung diseases and hypoxia: Secondary | ICD-10-CM | POA: Insufficient documentation

## 2024-06-09 DIAGNOSIS — D72829 Elevated white blood cell count, unspecified: Secondary | ICD-10-CM | POA: Insufficient documentation

## 2024-06-09 DIAGNOSIS — J8410 Pulmonary fibrosis, unspecified: Secondary | ICD-10-CM | POA: Diagnosis not present

## 2024-06-09 DIAGNOSIS — N1832 Chronic kidney disease, stage 3b: Secondary | ICD-10-CM | POA: Diagnosis not present

## 2024-06-09 DIAGNOSIS — R0602 Shortness of breath: Secondary | ICD-10-CM | POA: Diagnosis present

## 2024-06-09 DIAGNOSIS — I130 Hypertensive heart and chronic kidney disease with heart failure and stage 1 through stage 4 chronic kidney disease, or unspecified chronic kidney disease: Secondary | ICD-10-CM | POA: Insufficient documentation

## 2024-06-09 DIAGNOSIS — R55 Syncope and collapse: Secondary | ICD-10-CM | POA: Diagnosis not present

## 2024-06-09 DIAGNOSIS — Z794 Long term (current) use of insulin: Secondary | ICD-10-CM | POA: Insufficient documentation

## 2024-06-09 DIAGNOSIS — N1831 Chronic kidney disease, stage 3a: Secondary | ICD-10-CM | POA: Diagnosis not present

## 2024-06-09 DIAGNOSIS — J9621 Acute and chronic respiratory failure with hypoxia: Secondary | ICD-10-CM | POA: Diagnosis not present

## 2024-06-09 DIAGNOSIS — I5033 Acute on chronic diastolic (congestive) heart failure: Secondary | ICD-10-CM | POA: Diagnosis not present

## 2024-06-09 DIAGNOSIS — E1122 Type 2 diabetes mellitus with diabetic chronic kidney disease: Secondary | ICD-10-CM | POA: Diagnosis not present

## 2024-06-09 DIAGNOSIS — R9089 Other abnormal findings on diagnostic imaging of central nervous system: Secondary | ICD-10-CM | POA: Diagnosis not present

## 2024-06-09 DIAGNOSIS — E11649 Type 2 diabetes mellitus with hypoglycemia without coma: Secondary | ICD-10-CM | POA: Insufficient documentation

## 2024-06-09 DIAGNOSIS — E1129 Type 2 diabetes mellitus with other diabetic kidney complication: Secondary | ICD-10-CM | POA: Diagnosis not present

## 2024-06-09 DIAGNOSIS — Z7984 Long term (current) use of oral hypoglycemic drugs: Secondary | ICD-10-CM | POA: Diagnosis not present

## 2024-06-09 DIAGNOSIS — R590 Localized enlarged lymph nodes: Secondary | ICD-10-CM | POA: Diagnosis not present

## 2024-06-09 DIAGNOSIS — I959 Hypotension, unspecified: Secondary | ICD-10-CM | POA: Diagnosis not present

## 2024-06-09 DIAGNOSIS — E162 Hypoglycemia, unspecified: Secondary | ICD-10-CM

## 2024-06-09 DIAGNOSIS — I2510 Atherosclerotic heart disease of native coronary artery without angina pectoris: Secondary | ICD-10-CM | POA: Diagnosis not present

## 2024-06-09 DIAGNOSIS — M6281 Muscle weakness (generalized): Secondary | ICD-10-CM | POA: Insufficient documentation

## 2024-06-09 DIAGNOSIS — Z79899 Other long term (current) drug therapy: Secondary | ICD-10-CM | POA: Insufficient documentation

## 2024-06-09 DIAGNOSIS — I10 Essential (primary) hypertension: Secondary | ICD-10-CM | POA: Diagnosis not present

## 2024-06-09 DIAGNOSIS — I517 Cardiomegaly: Secondary | ICD-10-CM | POA: Diagnosis not present

## 2024-06-09 DIAGNOSIS — R918 Other nonspecific abnormal finding of lung field: Secondary | ICD-10-CM | POA: Diagnosis not present

## 2024-06-09 DIAGNOSIS — J9611 Chronic respiratory failure with hypoxia: Secondary | ICD-10-CM | POA: Diagnosis not present

## 2024-06-09 LAB — COMPREHENSIVE METABOLIC PANEL WITH GFR
ALT: 13 U/L (ref 0–44)
AST: 22 U/L (ref 15–41)
Albumin: 3.5 g/dL (ref 3.5–5.0)
Alkaline Phosphatase: 71 U/L (ref 38–126)
Anion gap: 13 (ref 5–15)
BUN: 22 mg/dL (ref 8–23)
CO2: 24 mmol/L (ref 22–32)
Calcium: 8.8 mg/dL — ABNORMAL LOW (ref 8.9–10.3)
Chloride: 103 mmol/L (ref 98–111)
Creatinine, Ser: 1.29 mg/dL — ABNORMAL HIGH (ref 0.61–1.24)
GFR, Estimated: 57 mL/min — ABNORMAL LOW (ref >60)
Glucose, Bld: 60 mg/dL — ABNORMAL LOW (ref 70–99)
Potassium: 4 mmol/L (ref 3.5–5.1)
Sodium: 140 mmol/L (ref 135–145)
Total Bilirubin: 0.9 mg/dL (ref 0.0–1.2)
Total Protein: 6.9 g/dL (ref 6.5–8.1)

## 2024-06-09 LAB — BLOOD GAS, VENOUS
Acid-Base Excess: 1 mmol/L (ref 0.0–2.0)
Bicarbonate: 27.1 mmol/L (ref 20.0–28.0)
Drawn by: 66297
O2 Saturation: 45.2 %
Patient temperature: 36.8
pCO2, Ven: 48 mmHg (ref 44–60)
pH, Ven: 7.36 (ref 7.25–7.43)
pO2, Ven: <31 mmHg — CL (ref 32–45)

## 2024-06-09 LAB — CBC WITH DIFFERENTIAL/PLATELET
Abs Immature Granulocytes: 0.05 K/uL (ref 0.00–0.07)
Basophils Absolute: 0.1 K/uL (ref 0.0–0.1)
Basophils Relative: 1 %
Eosinophils Absolute: 0.1 K/uL (ref 0.0–0.5)
Eosinophils Relative: 1 %
HCT: 40.2 % (ref 39.0–52.0)
Hemoglobin: 13.1 g/dL (ref 13.0–17.0)
Immature Granulocytes: 1 %
Lymphocytes Relative: 9 %
Lymphs Abs: 0.9 K/uL (ref 0.7–4.0)
MCH: 30.6 pg (ref 26.0–34.0)
MCHC: 32.6 g/dL (ref 30.0–36.0)
MCV: 93.9 fL (ref 80.0–100.0)
Monocytes Absolute: 1.2 K/uL — ABNORMAL HIGH (ref 0.1–1.0)
Monocytes Relative: 11 %
Neutro Abs: 8.4 K/uL — ABNORMAL HIGH (ref 1.7–7.7)
Neutrophils Relative %: 77 %
Platelets: 322 K/uL (ref 150–400)
RBC: 4.28 MIL/uL (ref 4.22–5.81)
RDW: 16.4 % — ABNORMAL HIGH (ref 11.5–15.5)
WBC: 10.8 K/uL — ABNORMAL HIGH (ref 4.0–10.5)
nRBC: 0 % (ref 0.0–0.2)

## 2024-06-09 LAB — PROTIME-INR
INR: 1.3 — ABNORMAL HIGH (ref 0.8–1.2)
Prothrombin Time: 17 s — ABNORMAL HIGH (ref 11.4–15.2)

## 2024-06-09 LAB — RESP PANEL BY RT-PCR (RSV, FLU A&B, COVID)  RVPGX2
Influenza A by PCR: NEGATIVE
Influenza B by PCR: NEGATIVE
Resp Syncytial Virus by PCR: NEGATIVE
SARS Coronavirus 2 by RT PCR: NEGATIVE

## 2024-06-09 LAB — TROPONIN I (HIGH SENSITIVITY)
Troponin I (High Sensitivity): 17 ng/L (ref <18)
Troponin I (High Sensitivity): 16 ng/L (ref <18)

## 2024-06-09 LAB — BRAIN NATRIURETIC PEPTIDE: B Natriuretic Peptide: 698 pg/mL — ABNORMAL HIGH (ref 0.0–100.0)

## 2024-06-09 LAB — CORTISOL: Cortisol, Plasma: 64.4 ug/dL

## 2024-06-09 LAB — GLUCOSE, CAPILLARY
Glucose-Capillary: 214 mg/dL — ABNORMAL HIGH (ref 70–99)
Glucose-Capillary: 263 mg/dL — ABNORMAL HIGH (ref 70–99)

## 2024-06-09 LAB — LACTIC ACID, PLASMA
Lactic Acid, Venous: 2 mmol/L (ref 0.5–1.9)
Lactic Acid, Venous: 2 mmol/L (ref 0.5–1.9)

## 2024-06-09 LAB — MAGNESIUM: Magnesium: 1.9 mg/dL (ref 1.7–2.4)

## 2024-06-09 LAB — CBG MONITORING, ED
Glucose-Capillary: 150 mg/dL — ABNORMAL HIGH (ref 70–99)
Glucose-Capillary: 47 mg/dL — ABNORMAL LOW (ref 70–99)

## 2024-06-09 LAB — TSH: TSH: 4.364 u[IU]/mL (ref 0.350–4.500)

## 2024-06-09 MED ORDER — ENOXAPARIN SODIUM 40 MG/0.4ML IJ SOSY
40.0000 mg | PREFILLED_SYRINGE | INTRAMUSCULAR | Status: DC
Start: 2024-06-09 — End: 2024-06-10
  Administered 2024-06-09: 40 mg via SUBCUTANEOUS
  Filled 2024-06-09: qty 0.4

## 2024-06-09 MED ORDER — DOXYCYCLINE HYCLATE 100 MG PO TABS
100.0000 mg | ORAL_TABLET | Freq: Once | ORAL | Status: AC
  Administered 2024-06-09: 100 mg via ORAL
  Filled 2024-06-09: qty 1

## 2024-06-09 MED ORDER — SODIUM CHLORIDE 0.9 % IV SOLN
1.0000 g | Freq: Once | INTRAVENOUS | Status: AC
  Administered 2024-06-09: 1 g via INTRAVENOUS
  Filled 2024-06-09: qty 10

## 2024-06-09 MED ORDER — HYDROCORTISONE SOD SUC (PF) 100 MG IJ SOLR
100.0000 mg | Freq: Once | INTRAMUSCULAR | Status: AC
  Administered 2024-06-09: 100 mg via INTRAVENOUS
  Filled 2024-06-09: qty 2

## 2024-06-09 MED ORDER — FUROSEMIDE 10 MG/ML IJ SOLN
40.0000 mg | Freq: Once | INTRAMUSCULAR | Status: AC
  Administered 2024-06-09: 40 mg via INTRAVENOUS
  Filled 2024-06-09: qty 4

## 2024-06-09 MED ORDER — DOCUSATE SODIUM 100 MG PO CAPS
100.0000 mg | ORAL_CAPSULE | Freq: Two times a day (BID) | ORAL | Status: DC
  Administered 2024-06-09 – 2024-06-10 (×2): 100 mg via ORAL
  Filled 2024-06-09 (×2): qty 1

## 2024-06-09 MED ORDER — IPRATROPIUM-ALBUTEROL 0.5-2.5 (3) MG/3ML IN SOLN
3.0000 mL | Freq: Once | RESPIRATORY_TRACT | Status: AC
  Administered 2024-06-09: 3 mL via RESPIRATORY_TRACT
  Filled 2024-06-09: qty 3

## 2024-06-09 MED ORDER — ACETAMINOPHEN 325 MG PO TABS: 650.0000 mg | ORAL_TABLET | Freq: Four times a day (QID) | ORAL | Status: DC | PRN

## 2024-06-09 MED ORDER — IOHEXOL 350 MG/ML SOLN
75.0000 mL | Freq: Once | INTRAVENOUS | Status: AC | PRN
Start: 2024-06-09 — End: 2024-06-09
  Administered 2024-06-09: 75 mL via INTRAVENOUS

## 2024-06-09 MED ORDER — FUROSEMIDE 10 MG/ML IJ SOLN
20.0000 mg | Freq: Every day | INTRAMUSCULAR | Status: DC
  Filled 2024-06-09: qty 2

## 2024-06-09 MED ORDER — LATANOPROST 0.005 % OP SOLN
1.0000 [drp] | Freq: Every day | OPHTHALMIC | Status: DC
  Administered 2024-06-09: 1 [drp] via OPHTHALMIC
  Filled 2024-06-09: qty 2.5

## 2024-06-09 MED ORDER — ACETAMINOPHEN 650 MG RE SUPP: 650.0000 mg | Freq: Four times a day (QID) | RECTAL | Status: DC | PRN

## 2024-06-09 MED ORDER — IOHEXOL 300 MG/ML  SOLN: 75.0000 mL | Freq: Once | INTRAMUSCULAR | Status: DC | PRN

## 2024-06-09 MED ORDER — ONDANSETRON HCL 4 MG/2ML IJ SOLN: 4.0000 mg | Freq: Four times a day (QID) | INTRAMUSCULAR | Status: DC | PRN

## 2024-06-09 MED ORDER — INSULIN ASPART 100 UNIT/ML IJ SOLN
0.0000 [IU] | Freq: Three times a day (TID) | INTRAMUSCULAR | Status: DC
  Administered 2024-06-09: 3 [IU] via SUBCUTANEOUS

## 2024-06-09 MED ORDER — MIDODRINE HCL 5 MG PO TABS
5.0000 mg | ORAL_TABLET | Freq: Three times a day (TID) | ORAL | Status: DC
  Filled 2024-06-09: qty 1

## 2024-06-09 MED ORDER — DEXTROSE 50 % IV SOLN
INTRAVENOUS | Status: AC
Start: 2024-06-09 — End: 2024-06-09
  Administered 2024-06-09: 50 mL
  Filled 2024-06-09: qty 50

## 2024-06-09 MED ORDER — INSULIN ASPART 100 UNIT/ML IJ SOLN
0.0000 [IU] | Freq: Every day | INTRAMUSCULAR | Status: DC
  Administered 2024-06-09: 3 [IU] via SUBCUTANEOUS

## 2024-06-09 MED ORDER — NOREPINEPHRINE 4 MG/250ML-% IV SOLN: 0.0000 ug/min | INTRAVENOUS | Status: DC

## 2024-06-09 MED ORDER — ONDANSETRON HCL 4 MG PO TABS: 4.0000 mg | ORAL_TABLET | Freq: Four times a day (QID) | ORAL | Status: DC | PRN

## 2024-06-09 NOTE — ED Triage Notes (Signed)
 Pt bib rcems for Syncope episode witnessed at home. Pt was found to be hypotensive w/ systolic of 70s with ems. Pt wears 6L at baseline with sats normally in 90s. Pt was 83% on 8L w/ EMS. Edp at bedside. Pt is aware and alert.

## 2024-06-09 NOTE — Progress Notes (Signed)
   06/09/24 1721  Assess: MEWS Score  Temp 97.7 F (36.5 C)  BP (!) 85/66  MAP (mmHg) 71  Pulse Rate (!) 47  Resp 18  Level of Consciousness Alert  SpO2 90 %  O2 Device HFNC  O2 Flow Rate (L/min) 6 L/min  Assess: MEWS Score  MEWS Temp 0  MEWS Systolic 1  MEWS Pulse 1  MEWS RR 0  MEWS LOC 0  MEWS Score 2  MEWS Score Color Yellow  Assess: if the MEWS score is Yellow or Red  Were vital signs accurate and taken at a resting state? Yes  Does the patient meet 2 or more of the SIRS criteria? No  MEWS guidelines implemented  Yes, yellow  Treat  MEWS Interventions Considered administering scheduled or prn medications/treatments as ordered  Take Vital Signs  Increase Vital Sign Frequency  Yellow: Q2hr x1, continue Q4hrs until patient remains green for 12hrs  Escalate  MEWS: Escalate Yellow: Discuss with charge nurse and consider notifying provider and/or RRT  Notify: Charge Nurse/RN  Name of Charge Nurse/RN Notified [facility] RN  Provider Notification  Provider Name/Title Dr. Juvenal  Date Provider Notified 06/09/24  Time Provider Notified 1723  Method of Notification Page  Notification Reason Other (Comment) (hypotension)  Provider response No new orders  Date of Provider Response 06/09/24  Time of Provider Response 1724  Assess: SIRS CRITERIA  SIRS Temperature  0  SIRS Respirations  0  SIRS Pulse 0  SIRS WBC 0  SIRS Score Sum  0

## 2024-06-09 NOTE — ED Notes (Signed)
 Both sets of blood cultures obtained before any ABX administration

## 2024-06-09 NOTE — H&P (Addendum)
 History and Physical    Christian Woodard FMW:[id number] DOB: [birth year] DOA: 06/09/2024  I have briefly reviewed the patient's prior medical records in [facility]  PCP: Sheryle Carwin, MD  Patient coming from: Home  Chief Complaint: Passing out  HPI: Christian Woodard is a 78 y.o. male with medical history significant of pulmonary fibrosis, pulmonary hypertension, diabetes, chronic diastolic CHF, chronic hypoxic respiratory failure who presents from home after syncopal event. Patient went to his see his PCP today where his blood pressure was found to be low (family reports systolic in the 80s).  Family reports he had an episode of generalized weakness, sat down in a chair, and then passed out.  Daughter reports he stopped breathing for short period time but responded to a sternal rub.  EMS was called.  When EMS arrived he was placed on 8 L nasal cannula and satting in the 80s.  His blood pressure was found to be in the 70s.  He was given IV fluids and placed in Trendelenburg with improvement of blood pressure. In the ER, his CBG was found to be in the 40s and he was given an amp of D50 with improvement.  ER doc was concern for adrenal insufficiency as he was recently on several courses of prednisone  so he was dosed with Solu-Cortef  x 1.  CT scan was obtained of his lungs that questioned fluid overload so he was given a dose of IV Lasix .  TRH was asked to admit for further workup and monitoring.  Of note he was recently hospitalized in May 2025 with a similar episode of worsening hypoxia and syncope.  Workup was done and thought to be vasovagal with exacerbation of his pulmonary hypertension.  He was given steroids and discharged to a skilled nursing facility where he remained for 2 weeks.   Review of Systems: As per HPI otherwise 10 point review of systems negative.   Past Medical History:  Diagnosis Date   Arthritis    Barrett's esophagus    With low-grade dysplasia, following with [facility]    Diabetes mellitus    GERD (gastroesophageal reflux disease)    Hypercholesteremia     Past Surgical History:  Procedure Laterality Date   BACK SURGERY  2000   CHOLECYSTECTOMY     COLONOSCOPY  09/15/2011   Dr. Shaaron: Suboptimal prep. Pancolonic diverticulosis. Tubular adenomas. Next colonoscopy 5 years.   COLONOSCOPY N/A 09/22/2016   Surgeon: Lamar CHRISTELLA Shaaron, MD; pancolonic diverticulosis, otherwise normal exam.  Recommended repeat in 5 years.   COLONOSCOPY WITH PROPOFOL  N/A 09/29/2021   Procedure: COLONOSCOPY WITH PROPOFOL ;  Surgeon: Shaaron Lamar CHRISTELLA, MD;  [facility];  Service: Endoscopy;  Laterality: N/A;  9:00am   ESOPHAGOGASTRODUODENOSCOPY N/A 08/31/2017   Surgeon: Shaaron Lamar CHRISTELLA, MD;  Barrett's esophagus with low-grade dysplasia, small hiatal hernia with abnormal gastric mucosa biopsied (no H. pylori), normal examined duodenum.     ESOPHAGOGASTRODUODENOSCOPY  11/2018   [facility]; Barrett's esophagus, negative for dysplasia   ESOPHAGOGASTRODUODENOSCOPY  01/2020   [facility]; Barrett's esophagus, indefinite for focal low-grade dysplasia.   GIVENS CAPSULE STUDY N/A 10/26/2017   Surgeon: Shaaron Lamar CHRISTELLA, MD; localized clustered areas of white blunted appearing abnormal small bowel mucosa/villi of uncertain significance.  Follow-up CTE and small bowel enteroscopy at [facility] without significant findings.    GIVENS CAPSULE STUDY N/A 05/15/2023   Procedure: GIVENS CAPSULE STUDY;  Surgeon: Shaaron Lamar CHRISTELLA, MD;  Location: [facility];  Service: Endoscopy;  Laterality: N/A;  730am  HERNIA REPAIR     as a child   left cornea transplant Left    POLYPECTOMY  09/29/2021   Procedure: POLYPECTOMY;  Surgeon: Shaaron Lamar HERO, MD;  Location: [facility];  Service: Endoscopy;;   Right cornea transplant     RIGHT HEART CATH N/A 12/17/2023   Procedure: RIGHT HEART CATH;  Surgeon: Rolan Ezra RAMAN, MD;  Location: [facility] INVASIVE CV LAB;  Service: Cardiovascular;  Laterality: N/A;   SMALL BOWEL ENTEROSCOPY   11/2017   [facility]; Barrett's esophagus focally positive for low-grade dysplasia, normal stomach, normal duodenum, normal jejunum.  Small bowel biopsies were benign.   TONSILLECTOMY       reports that he quit smoking about 25 years ago. His smoking use included cigarettes. He started smoking about 61 years ago. He has a 72 pack-year smoking history. He has never used smokeless tobacco. He reports that he does not drink alcohol and does not use drugs.  No Known Allergies  Family History  Problem Relation Age of Onset   Heart disease Mother    Heart disease Father    Emphysema Father        smoked   Lung cancer Father        smoked   Colon cancer Neg Hx     Prior to Admission medications   Medication Sig Start Date End Date Taking? Authorizing Provider  acetaminophen  (TYLENOL ) 325 MG tablet Take 2 tablets (650 mg total) by mouth every 6 (six) hours as needed for mild pain (pain score 1-3) (or Fever >/= 101). 04/10/24   Pearlean Manus, MD  aspirin  EC 81 MG tablet Take 1 tablet (81 mg total) by mouth daily with breakfast. Swallow whole. 04/10/24   Pearlean Manus, MD  atorvastatin  (LIPITOR) 10 MG tablet Take 10 mg by mouth at bedtime.    [provider]  cyanocobalamin  (VITAMIN B12) 1000 MCG tablet Take 1,000 mcg by mouth daily.    [provider]  famotidine  (PEPCID ) 20 MG tablet Take 1 tablet (20 mg total) by mouth daily. 04/10/24   Pearlean Manus, MD  Ferrous Gluconate  (IRON  27 PO) Take 1 tablet by mouth daily.    [provider]  furosemide  (LASIX ) 40 MG tablet Take 1 tablet (40 mg total) by mouth daily. 04/10/24 04/10/25  Pearlean Manus, MD  gabapentin  (NEURONTIN ) 300 MG capsule Take 1 capsule (300 mg total) by mouth 2 (two) times daily. 05/19/23   Ricky Fines, MD  glipiZIDE  (GLUCOTROL  XL) 5 MG 24 hr tablet Take 5 mg by mouth daily with breakfast.    [provider]  IYUZEH  0.005 % SOLN Apply 1 drop to eye at bedtime. 03/31/24   [provider]  LANTUS  SOLOSTAR 100 UNIT/ML Solostar Pen Inject 20 Units into the skin 2 (two) times daily. 04/10/24   Pearlean Manus, MD  losartan  (COZAAR ) 25 MG tablet Take 1 tablet (25 mg total) by mouth daily. 04/11/24   Pearlean Manus, MD  metFORMIN  (GLUCOPHAGE ) 500 MG tablet Take 500 mg by mouth 2 (two) times daily. 03/04/24   [provider]  NOVOLOG  FLEXPEN 100 UNIT/ML FlexPen Inject 0-12 Units into the skin See admin instructions. Short acting insulin  per sliding scale 0-10 ---:-- Insulin  injection 0-10 Units 0-10 Units Subcutaneous, 3 times daily with meals CBG 70 - 120: 0 unit CBG 121 - 150: 0 unit  CBG 151 - 200: 2 unit CBG 201 - 250: 4 units CBG 251 - 300: 6 units CBG 301 - 350: 8 units  CBG 351 - 400: 10 units  CBG > 400: 12 units 04/10/24   Emokpae, Courage, MD  pantoprazole  (PROTONIX ) 40 MG tablet TAKE 1 TABLET BY MOUTH ONE TIME DAILY 30-60 MINUTES BEFORE BREAKFAST 05/22/23   Ezzard Sonny RAMAN, PA-C  Pirfenidone  267 MG TABS Take 2 tablets (534 mg total) by mouth in the morning, at noon, and at bedtime. **low dose as maintenance** 04/25/24   Geronimo Amel, MD  potassium chloride  (MICRO-K ) 10 MEQ CR capsule Take 10 mEq by mouth daily. 10/24/23   [provider]  predniSONE  (DELTASONE ) 10 MG tablet Please take prednisone  40 mg x1 day, then 30 mg x1 day, then 20 mg x1 day, then 10 mg x1 day, and then 5 mg x1 day and stop 05/06/24   Geronimo Amel, MD  senna-docusate (SENOKOT-S) 8.6-50 MG tablet Take 2 tablets by mouth at bedtime. 04/10/24   Pearlean Manus, MD  TYVASO  DPI MAINTENANCE KIT 64 MCG POWD Inhale into the lungs. 03/26/24   [provider]    Physical Exam: Vitals:   06/09/24 1355 06/09/24 1400 06/09/24 1409 06/09/24 1500  BP: 90/70 107/62  118/65  Pulse: 87 84  81  Resp: (!) 21 (!) 23  17  Temp:      TempSrc:      SpO2: 95% 96% 90% 98%  Weight:      Height:          Constitutional: Chronically ill, pale appearing Eyes: PERRL, lids and conjunctivae  normal ENMT: Mucous membranes are moist. Posterior pharynx clear of any exudate or lesions.Normal dentition.  Neck: normal, supple, no masses, no thyromegaly Respiratory: On nasal cannula, diminished, no wheezing Cardiovascular: Regular rate and rhythm, no murmurs / rubs / gallops.  Minimal lower extremity edema Abdomen: no tenderness, no masses palpated. Bowel sounds positive.  Musculoskeletal: no clubbing / cyanosis. Normal muscle tone.  Skin: no rashes, lesions, ulcers. No induration Neurologic: CN 2-12 grossly intact. Strength 5/5 in all 4.  Psychiatric: Normal judgment and insight. Alert and oriented x 3. Normal mood.   Labs on Admission: I have personally reviewed following labs and imaging studies  CBC: Recent Labs  Lab 06/09/24 1309  WBC 10.8*  NEUTROABS 8.4*  HGB 13.1  HCT 40.2  MCV 93.9  PLT 322   Basic Metabolic Panel: Recent Labs  Lab 06/09/24 1309  NA 140  K 4.0  CL 103  CO2 24  GLUCOSE 60*  BUN 22  CREATININE 1.29*  CALCIUM  8.8*  MG 1.9   GFR: Estimated Creatinine Clearance: 42.6 mL/min (A) (by C-G formula based on SCr of 1.29 mg/dL (H)). Liver Function Tests: Recent Labs  Lab 06/09/24 1309  AST 22  ALT 13  ALKPHOS 71  BILITOT 0.9  PROT 6.9  ALBUMIN 3.5   No results for input(s): LIPASE, AMYLASE in the last 168 hours. No results for input(s): AMMONIA in the last 168 hours. Coagulation Profile: Recent Labs  Lab 06/09/24 1309  INR 1.3*   Cardiac Enzymes: No results for input(s): CKTOTAL, CKMB, CKMBINDEX, TROPONINI in the last 168 hours. BNP (last 3 results) Recent Labs    01/15/24 1156  PROBNP 165.0*   HbA1C: No results for input(s): HGBA1C in the last 72 hours. CBG: Recent Labs  Lab 06/09/24 1330 06/09/24 1417  GLUCAP 47* 150*   Lipid Profile: No results for input(s): CHOL, HDL, LDLCALC, TRIG, CHOLHDL, LDLDIRECT in the last 72 hours. Thyroid  Function Tests: Recent Labs    06/09/24 1320  TSH  4.364  Anemia Panel: No results for input(s): VITAMINB12, FOLATE, FERRITIN, TIBC, IRON , RETICCTPCT in the last 72 hours. Urine analysis:    Component Value Date/Time   COLORURINE YELLOW 04/07/2024 0530   APPEARANCEUR CLEAR 04/07/2024 0530   LABSPEC 1.021 04/07/2024 0530   PHURINE 5.0 04/07/2024 0530   GLUCOSEU 150 (A) 04/07/2024 0530   HGBUR NEGATIVE 04/07/2024 0530   BILIRUBINUR NEGATIVE 04/07/2024 0530   KETONESUR NEGATIVE 04/07/2024 0530   PROTEINUR NEGATIVE 04/07/2024 0530   NITRITE NEGATIVE 04/07/2024 0530   LEUKOCYTESUR NEGATIVE 04/07/2024 0530     Radiological Exams on Admission: CT Angio Chest PE W and/or Wo Contrast Result Date: 06/09/2024 CLINICAL DATA:  Syncope in shortness of breath EXAM: CT ANGIOGRAPHY CHEST WITH CONTRAST TECHNIQUE: Multidetector CT imaging of the chest was performed using the standard protocol during bolus administration of intravenous contrast. Multiplanar CT image reconstructions and MIPs were obtained to evaluate the vascular anatomy. RADIATION DOSE REDUCTION: This exam was performed according to the departmental dose-optimization program which includes automated exposure control, adjustment of the mA and/or kV according to patient size and/or use of iterative reconstruction technique. CONTRAST:  75mL OMNIPAQUE  IOHEXOL  350 MG/ML SOLN COMPARISON:  04/06/2024 FINDINGS: Cardiovascular: No filling defect is identified in the pulmonary arterial tree to suggest pulmonary embolus. Moderate cardiomegaly particularly with right heart enlargement. Coronary, aortic arch, and branch vessel atherosclerotic vascular disease. Prominent main pulmonary artery favoring pulmonary arterial hypertension. Mediastinum/Nodes: Stable mild mediastinal adenopathy including right and left paratracheal lymph nodes and AP window lymph nodes as well as borderline prominent right subcarinal and infrahilar lymph nodes. Index AP window lymph node 1.2 cm in short axis on image 32  series 1, formerly 1.1 cm. Lungs/Pleura: Emphysema. Chronic bilateral interstitial accentuation with hazy ground-glass densities in both lungs potentially from mild pulmonary edema superimposed on chronic interstitial lung disease. Upper Abdomen: Mild reflux of contrast into the hepatic veins. Cholecystectomy. Narrowing of the proximal celiac trunk primarily due to median arcuate ligament syndrome and to a lesser degree due to atherosclerosis. Atheromatous plaque proximally in the SMA. Neither vessel appears overtly occluded. Musculoskeletal: Unremarkable Review of the MIP images confirms the above findings. IMPRESSION: 1. No filling defect is identified in the pulmonary arterial tree to suggest pulmonary embolus. 2. Moderate cardiomegaly particularly with right heart enlargement. Mild reflux of contrast into the hepatic veins. 3. Prominent main pulmonary artery favoring pulmonary arterial hypertension. 4. Chronic bilateral interstitial accentuation with hazy ground-glass densities in both lungs potentially from mild pulmonary edema superimposed on chronic interstitial lung disease. 5. Stable mild mediastinal adenopathy. 6. Narrowing of the proximal celiac trunk primarily due to median arcuate ligament syndrome and to a lesser degree due to atherosclerosis. 7. Aortic Atherosclerosis (ICD10-I70.0) and Emphysema (ICD10-J43.9). Electronically Signed   By: Ryan Salvage M.D.   On: 06/09/2024 14:50   CT Head Wo Contrast Result Date: 06/09/2024 CLINICAL DATA:  04/06/2024 EXAM: CT HEAD WITHOUT CONTRAST TECHNIQUE: Contiguous axial images were obtained from the base of the skull through the vertex without intravenous contrast. RADIATION DOSE REDUCTION: This exam was performed according to the departmental dose-optimization program which includes automated exposure control, adjustment of the mA and/or kV according to patient size and/or use of iterative reconstruction technique. COMPARISON:  04/06/2024 FINDINGS:  Brain: Mild age related volume loss. No acute intracranial abnormality. Specifically, no hemorrhage, hydrocephalus, mass lesion, acute infarction, or significant intracranial injury. Vascular: No hyperdense vessel or unexpected calcification. Skull: No acute calvarial abnormality. Sinuses/Orbits: No acute finding Other: None IMPRESSION: No acute intracranial abnormality. Electronically  Signed   By: Franky Crease M.D.   On: 06/09/2024 14:42   DG Chest Port 1 View Result Date: 06/09/2024 CLINICAL DATA:  Suspected sepsis EXAM: PORTABLE CHEST 1 VIEW COMPARISON:  Apr 10, 2024 FINDINGS: No focal consolidation. Unchanged chronic interstitial opacities. No pleural effusions. No pneumothorax. Unchanged cardiomegaly. No acute osseous findings. IMPRESSION: No acute findings.  Unchanged cardiomegaly. Electronically Signed   By: Michaeline Blanch M.D.   On: 06/09/2024 13:36    EKG: Independently reviewed. Sinus with PVCs  Assessment/Plan Principal Problem:   Vasovagal syncope   Acute on chronic respiratory failure with hypoxia (HCC) CTA chest was negative for PE but showed chronic bilateral interstitial accentuation with hazy ground-glass densities in both lungs potentially from mild pulmonary edema superimposed on chronic interstitial lung disease. -Wean to home 6 L of oxygen via nasal cannula -Will need follow-up with pulmonologist postdischarge   Syncope Seems like vasovagal.   - Patient has similar episode in May 2025 -Check orthostatics, PT eval, a.m. cortisol pending (a dose of Solu-Medrol  given in the ER) -TSH normal in the ER -Most likely will need to stop patient's ARB  Acute on chronic diastolic CHF Patient had elevated BNP on admission.   -Can be a confounder with severe pulmonary hypertension and CKD stage IIIa.  Echocardiogram consistent with volume overload and pulmonary hypertension, hyperdynamic EF-with EF greater than 75% - IV Lasix  with close monitoring of blood pressure and laboratory  data   Pulmonary fibrosis (HCC) - Continuing home pirfenidone  - Patient sees Dr. Geronimo as outpatient - Recently on 2 different steroid tapers, check cortisol level   -Pulmonary hypertension in the setting of pulmonary fibrosis - Defer to outpatient follow-up   Type 2 diabetes mellitus with hypo-glycemia -Had episode of hypoglycemia in the ER - Will use sliding scale insulin  for now -Consider resumption of a long-acting versus perhaps Tradjenta in place of glipizide    CKD stage 3a, GFR 45-59 ml/min (HCC) -Trend creatinine, daily labs    Medications will need to be reconciled once done by pharmacy   DVT prophylaxis: lovenox   Code Status: full  Family Communication: at bedside Disposition Plan: home in 1-2 days Consults called: none   Harlene RAYMOND Bowl [facility]   How to contact the [facility] Attending or Consulting provider 7A - 7P or covering provider during after hours 7P -7A, for this patient?  Check the care team in [facility] and look for a) attending/consulting TR[facility] provider listed and b) the TR[facility] team listed Log into [email or web address] and use [facility]'s universal password to access. If you do not have the password, please contact the hospital operator. Locate the [facility] provider you are looking for under [facility] and page to a number that you can be directly reached. If you still have difficulty reaching the provider, please page the [facility] (Director on Call) for the Hospitalists listed on amion for assistance.  06/09/2024, 3:45 PM

## 2024-06-09 NOTE — ED Notes (Signed)
 Patient transported to CT

## 2024-06-09 NOTE — TOC CM/SW Note (Signed)
 Transition of Care [facility]) - Inpatient Brief Assessment   Patient Details  Name: Christian Woodard MRN: [id number] Date of Birth: 01-Jun-1946  [facility] of [facility]) CM/SW Contact:    Noreen KATHEE Pinal, LCSWA Phone Number: 06/09/2024, 3:32 PM   Clinical Narrative:  Transition of Care Department [facility]) has reviewed patient and no TOC needs have been identified at this time. We will continue to monitor patient advancement through interdisciplinary progression rounds. If new patient transition needs arise, please place a TOC consult.   Transition of Care Asessment: Insurance and Status: Insurance coverage has been reviewed Patient has primary care physician: Yes Home environment has been reviewed: Single Family Home with spouse Prior level of function:: Independent Prior/Current Home Services: No current home services Social Drivers of Health Review: SDOH reviewed no interventions necessary Readmission risk has been reviewed: Yes Transition of care needs: no transition of care needs at this time

## 2024-06-09 NOTE — ED Provider Notes (Signed)
 [facility] Provider Note   CSN: [id number] Arrival date & time: 06/09/24  1255     Patient presents with: Near Syncope   Christian Woodard is a 78 y.o. male.    Near Syncope  Patient presents for syncope.  Medical history includes HTN, pulmonary HTN, CHF, pulmonary fibrosis, chronic hypoxia (baseline is 90% SpO2 on 6 L), GERD, DM, CKD, anemia.  He takes ASA but no other blood thinners.  He denies any recent blood loss.  He had a routine appointment with his PCP earlier today.  When he returned home from PCPs office, he had onset of generalized weakness.  He was sat down in the chair and had a syncopal episode, witnessed by daughter.  Daughter feels like he stops breathing momentarily, but then did respond to sternal rub.  Daughter noted that he seemed quite pale.  EMS was called to the scene.  EMS noted SpO2 of 83% on 8 L of supplemental oxygen.  He was initially hypotensive with SBP in the 70s.  He was placed in Trendelenburg and given 500 cc IVF.  Blood pressure improved to 130/50.  Patient currently denies any complaints.     Prior to Admission medications   Medication Sig Start Date End Date Taking? Authorizing Provider  acetaminophen  (TYLENOL ) 325 MG tablet Take 2 tablets (650 mg total) by mouth every 6 (six) hours as needed for mild pain (pain score 1-3) (or Fever >/= 101). 04/10/24   Pearlean Manus, MD  aspirin  EC 81 MG tablet Take 1 tablet (81 mg total) by mouth daily with breakfast. Swallow whole. 04/10/24   Pearlean Manus, MD  atorvastatin  (LIPITOR) 10 MG tablet Take 10 mg by mouth at bedtime.    [provider]  cyanocobalamin  (VITAMIN B12) 1000 MCG tablet Take 1,000 mcg by mouth daily.    [provider]  famotidine  (PEPCID ) 20 MG tablet Take 1 tablet (20 mg total) by mouth daily. 04/10/24   Pearlean Manus, MD  Ferrous Gluconate  (IRON  27 PO) Take 1 tablet by mouth daily.    [provider]  furosemide   (LASIX ) 40 MG tablet Take 1 tablet (40 mg total) by mouth daily. 04/10/24 04/10/25  Pearlean Manus, MD  gabapentin  (NEURONTIN ) 300 MG capsule Take 1 capsule (300 mg total) by mouth 2 (two) times daily. 05/19/23   Ricky Fines, MD  glipiZIDE  (GLUCOTROL  XL) 5 MG 24 hr tablet Take 5 mg by mouth daily with breakfast.    [provider]  IYUZEH  0.005 % SOLN Apply 1 drop to eye at bedtime. 03/31/24   [provider]  LANTUS  SOLOSTAR 100 UNIT/ML Solostar Pen Inject 20 Units into the skin 2 (two) times daily. 04/10/24   Pearlean Manus, MD  losartan  (COZAAR ) 25 MG tablet Take 1 tablet (25 mg total) by mouth daily. 04/11/24   Pearlean Manus, MD  metFORMIN  (GLUCOPHAGE ) 500 MG tablet Take 500 mg by mouth 2 (two) times daily. 03/04/24   [provider]  NOVOLOG  FLEXPEN 100 UNIT/ML FlexPen Inject 0-12 Units into the skin See admin instructions. Short acting insulin  per sliding scale 0-10 ---:-- Insulin  injection 0-10 Units 0-10 Units Subcutaneous, 3 times daily with meals CBG 70 - 120: 0 unit CBG 121 - 150: 0 unit  CBG 151 - 200: 2 unit CBG 201 - 250: 4 units CBG 251 - 300: 6 units CBG 301 - 350: 8 units  CBG 351 - 400: 10 units  CBG > 400: 12 units 04/10/24  Pearlean Manus, MD  pantoprazole  (PROTONIX ) 40 MG tablet TAKE 1 TABLET BY MOUTH ONE TIME DAILY 30-60 MINUTES BEFORE BREAKFAST 05/22/23   Ezzard Sonny RAMAN, PA-C  Pirfenidone  267 MG TABS Take 2 tablets (534 mg total) by mouth in the morning, at noon, and at bedtime. **low dose as maintenance** 04/25/24   Geronimo Amel, MD  potassium chloride  (MICRO-K ) 10 MEQ CR capsule Take 10 mEq by mouth daily. 10/24/23   [provider]  predniSONE  (DELTASONE ) 10 MG tablet Please take prednisone  40 mg x1 day, then 30 mg x1 day, then 20 mg x1 day, then 10 mg x1 day, and then 5 mg x1 day and stop 05/06/24   Geronimo Amel, MD  senna-docusate (SENOKOT-S) 8.6-50 MG tablet Take 2 tablets by mouth at bedtime. 04/10/24   Pearlean Manus, MD   TYVASO  DPI MAINTENANCE KIT 64 MCG POWD Inhale into the lungs. 03/26/24   [provider]    Allergies: Patient has no known allergies.    Review of Systems  Constitutional:  Positive for fatigue.  Cardiovascular:  Positive for near-syncope.  Skin:  Positive for pallor.  Neurological:  Positive for syncope and weakness (Generalized).  All other systems reviewed and are negative.   Updated Vital Signs BP 107/62   Pulse 84   Temp 98.3 F (36.8 C) (Oral)   Resp (!) 23   Ht 5' 6 (1.676 m)   Wt 72 kg   SpO2 90%   BMI 25.62 kg/m   Physical Exam Vitals and nursing note reviewed.  Constitutional:      General: He is not in acute distress.    Appearance: He is well-developed. He is ill-appearing. He is not toxic-appearing or diaphoretic.  HENT:     Head: Normocephalic and atraumatic.     Right Ear: External ear normal.     Left Ear: External ear normal.     Nose: Nose normal.     Mouth/Throat:     Mouth: Mucous membranes are moist.  Eyes:     Extraocular Movements: Extraocular movements intact.     Conjunctiva/sclera: Conjunctivae normal.  Cardiovascular:     Rate and Rhythm: Normal rate and regular rhythm.     Heart sounds: No murmur heard. Pulmonary:     Effort: Pulmonary effort is normal. No respiratory distress.     Breath sounds: Normal breath sounds. No wheezing or rales.  Abdominal:     General: There is no distension.     Palpations: Abdomen is soft.     Tenderness: There is no abdominal tenderness.  Musculoskeletal:        General: No deformity. Normal range of motion.     Cervical back: Normal range of motion and neck supple.  Skin:    General: Skin is warm and dry.     Capillary Refill: Capillary refill takes less than 2 seconds.     Coloration: Skin is pale. Skin is not jaundiced.  Neurological:     General: No focal deficit present.     Mental Status: He is alert and oriented to person, place, and time.     Cranial Nerves: No cranial nerve  deficit.     Sensory: No sensory deficit.     Motor: No weakness.     Coordination: Coordination normal.  Psychiatric:        Mood and Affect: Mood normal.        Behavior: Behavior normal.     (all labs ordered are listed, but only abnormal results  are displayed) Labs Reviewed  LACTIC ACID, PLASMA - Abnormal; Notable for the following components:      Result Value   Lactic Acid, Venous 2.0 (*)    All other components within normal limits  COMPREHENSIVE METABOLIC PANEL WITH GFR - Abnormal; Notable for the following components:   Glucose, Bld 60 (*)    Creatinine, Ser 1.29 (*)    Calcium  8.8 (*)    GFR, Estimated 57 (*)    All other components within normal limits  CBC WITH DIFFERENTIAL/PLATELET - Abnormal; Notable for the following components:   WBC 10.8 (*)    RDW 16.4 (*)    Neutro Abs 8.4 (*)    Monocytes Absolute 1.2 (*)    All other components within normal limits  PROTIME-INR - Abnormal; Notable for the following components:   Prothrombin Time 17.0 (*)    INR 1.3 (*)    All other components within normal limits  BRAIN NATRIURETIC PEPTIDE - Abnormal; Notable for the following components:   B Natriuretic Peptide 698.0 (*)    All other components within normal limits  BLOOD GAS, VENOUS - Abnormal; Notable for the following components:   pO2, Ven <31 (*)    All other components within normal limits  CBG MONITORING, ED - Abnormal; Notable for the following components:   Glucose-Capillary 47 (*)    All other components within normal limits  CBG MONITORING, ED - Abnormal; Notable for the following components:   Glucose-Capillary 150 (*)    All other components within normal limits  RESP PANEL BY RT-PCR (RSV, FLU A&B, COVID)  RVPGX2  CULTURE, BLOOD (ROUTINE X 2)  CULTURE, BLOOD (ROUTINE X 2)  MAGNESIUM  LACTIC ACID, PLASMA  URINALYSIS, W/ REFLEX TO CULTURE (INFECTION SUSPECTED)  TSH  I-STAT CHEM 8, ED  TROPONIN I (HIGH SENSITIVITY)  TROPONIN I (HIGH SENSITIVITY)     EKG: EKG Interpretation Date/Time:  Monday June 09 2024 13:06:26 EDT Ventricular Rate:  91 PR Interval:  176 QRS Duration:  111 QT Interval:  372 QTC Calculation: 458 R Axis:   204  Text Interpretation: Sinus rhythm Premature ventricular complexes Probable left atrial enlargement Abnormal T, consider ischemia, anterior leads Confirmed by Melvenia Motto ([id number]) on 06/09/2024 1:10:57 PM  Radiology: CT Angio Chest PE W and/or Wo Contrast Result Date: 06/09/2024 CLINICAL DATA:  Syncope in shortness of breath EXAM: CT ANGIOGRAPHY CHEST WITH CONTRAST TECHNIQUE: Multidetector CT imaging of the chest was performed using the standard protocol during bolus administration of intravenous contrast. Multiplanar CT image reconstructions and MIPs were obtained to evaluate the vascular anatomy. RADIATION DOSE REDUCTION: This exam was performed according to the departmental dose-optimization program which includes automated exposure control, adjustment of the mA and/or kV according to patient size and/or use of iterative reconstruction technique. CONTRAST:  75mL OMNIPAQUE  IOHEXOL  350 MG/ML SOLN COMPARISON:  04/06/2024 FINDINGS: Cardiovascular: No filling defect is identified in the pulmonary arterial tree to suggest pulmonary embolus. Moderate cardiomegaly particularly with right heart enlargement. Coronary, aortic arch, and branch vessel atherosclerotic vascular disease. Prominent main pulmonary artery favoring pulmonary arterial hypertension. Mediastinum/Nodes: Stable mild mediastinal adenopathy including right and left paratracheal lymph nodes and AP window lymph nodes as well as borderline prominent right subcarinal and infrahilar lymph nodes. Index AP window lymph node 1.2 cm in short axis on image 32 series 1, formerly 1.1 cm. Lungs/Pleura: Emphysema. Chronic bilateral interstitial accentuation with hazy ground-glass densities in both lungs potentially from mild pulmonary edema superimposed on chronic interstitial  lung disease. Upper  Abdomen: Mild reflux of contrast into the hepatic veins. Cholecystectomy. Narrowing of the proximal celiac trunk primarily due to median arcuate ligament syndrome and to a lesser degree due to atherosclerosis. Atheromatous plaque proximally in the SMA. Neither vessel appears overtly occluded. Musculoskeletal: Unremarkable Review of the MIP images confirms the above findings. IMPRESSION: 1. No filling defect is identified in the pulmonary arterial tree to suggest pulmonary embolus. 2. Moderate cardiomegaly particularly with right heart enlargement. Mild reflux of contrast into the hepatic veins. 3. Prominent main pulmonary artery favoring pulmonary arterial hypertension. 4. Chronic bilateral interstitial accentuation with hazy ground-glass densities in both lungs potentially from mild pulmonary edema superimposed on chronic interstitial lung disease. 5. Stable mild mediastinal adenopathy. 6. Narrowing of the proximal celiac trunk primarily due to median arcuate ligament syndrome and to a lesser degree due to atherosclerosis. 7. Aortic Atherosclerosis (ICD10-I70.0) and Emphysema (ICD10-J43.9). Electronically Signed   By: Talma Aguillard Salvage M.D.   On: 06/09/2024 14:50   CT Head Wo Contrast Result Date: 06/09/2024 CLINICAL DATA:  04/06/2024 EXAM: CT HEAD WITHOUT CONTRAST TECHNIQUE: Contiguous axial images were obtained from the base of the skull through the vertex without intravenous contrast. RADIATION DOSE REDUCTION: This exam was performed according to the departmental dose-optimization program which includes automated exposure control, adjustment of the mA and/or kV according to patient size and/or use of iterative reconstruction technique. COMPARISON:  04/06/2024 FINDINGS: Brain: Mild age related volume loss. No acute intracranial abnormality. Specifically, no hemorrhage, hydrocephalus, mass lesion, acute infarction, or significant intracranial injury. Vascular: No hyperdense vessel or  unexpected calcification. Skull: No acute calvarial abnormality. Sinuses/Orbits: No acute finding Other: None IMPRESSION: No acute intracranial abnormality. Electronically Signed   By: Franky Crease M.D.   On: 06/09/2024 14:42   DG Chest Port 1 View Result Date: 06/09/2024 CLINICAL DATA:  Suspected sepsis EXAM: PORTABLE CHEST 1 VIEW COMPARISON:  Apr 10, 2024 FINDINGS: No focal consolidation. Unchanged chronic interstitial opacities. No pleural effusions. No pneumothorax. Unchanged cardiomegaly. No acute osseous findings. IMPRESSION: No acute findings.  Unchanged cardiomegaly. Electronically Signed   By: Michaeline Blanch M.D.   On: 06/09/2024 13:36     Procedures   Medications Ordered in the ED  norepinephrine  (LEVOPHED ) 4mg  in (0.016 mg/mL) premix infusion (has no administration in time range)  cefTRIAXone  (ROCEPHIN ) 1 g in sodium chloride  0.9 % 100 mL IVPB (1 g Intravenous New Bag/Given 06/09/24 1454)  furosemide  (LASIX ) injection 40 mg (has no administration in time range)  dextrose  50 % solution (50 mLs  Given 06/09/24 1335)  hydrocortisone  sodium succinate  (SOLU-CORTEF ) 100 MG injection 100 mg (100 mg Intravenous Given 06/09/24 1359)  ipratropium-albuterol  (DUONEB) 0.5-2.5 (3) MG/3ML nebulizer solution 3 mL (3 mLs Nebulization Given 06/09/24 1359)  doxycycline  (VIBRA -TABS) tablet 100 mg (100 mg Oral Given 06/09/24 1455)  iohexol  (OMNIPAQUE ) 350 MG/ML injection 75 mL (75 mLs Intravenous Contrast Given 06/09/24 1426)                                    Medical Decision Making Amount and/or Complexity of Data Reviewed Labs: ordered. Radiology: ordered.  Risk Prescription drug management.   This patient presents to the ED for concern of syncope, this involves an extensive number of treatment options, and is a complaint that carries with it a high risk of complications and morbidity.  The differential diagnosis includes arrhythmia, anemia, dehydration, polypharmacy, hypoxia, hypercarbia,  seizure, CVA, TIA, metabolic derangements  Co morbidities / Chronic conditions that complicate the patient evaluation  HTN, pulmonary HTN, CHF, pulmonary fibrosis, chronic hypoxia (baseline is 90% SpO2 on 6 L), GERD, DM, CKD, anemia   Additional history obtained:  Additional history obtained from EMR External records from outside source obtained and reviewed including EMS   Lab Tests:  I Ordered, and personally interpreted labs.  The pertinent results include: Baseline creatinine, normal electrolytes, mild leukocytosis and lactic acidosis; hypoglycemia is present on initial lab work.   Imaging Studies ordered:  I ordered imaging studies including chest x-ray, CT head, CTA chest I independently visualized and interpreted imaging which showed bilateral groundglass densities consistent with pulmonary edema superimposed on chronic lung disease. I agree with the radiologist interpretation   Cardiac Monitoring: / EKG:  The patient was maintained on a cardiac monitor.  I personally viewed and interpreted the cardiac monitored which showed an underlying rhythm of: Sinus rhythm   Problem List / ED Course / Critical interventions / Medication management  Patient presents for syncopal episode at home.  EMS noted acute on chronic hypoxia and hypotension prior to arrival.  On arrival, patient is alert and oriented.  He remains pallorous.  Despite this, he denies any recent blood loss.  He is currently on a nonrebreather with SpO2 in the range of 90%.  Patient denies any physical complaints at this time.  His current breathing is unlabored and no wheezing is appreciated on auscultation.  Abdomen is soft and nontender.  Ventral hernia is noted.  Workup was initiated.  CBG was found to be in the 40s.  Amp of D50 was given.  Given hypoglycemia and hypotension, there is concern of adrenal insufficiency.  Patient was treated with a steroid taper several weeks ago.  Dose of Solu-Cortef  was ordered.   Patient was treated empirically with antibiotics.  Given his acute on chronic hypoxia, ceftriaxone  and doxycycline  were ordered for empiric treatment of pneumonia.  He remains normotensive.  He was able to be weaned to his baseline 6 L of oxygen by nasal cannula.  He subsequently had desaturations to 77%.  He was placed back on nonrebreather.  Will trial high flow O2.  Patient's hypoxic respiratory failure likely secondary to pulmonary edema, as identified on imaging studies and elevated BNP.  Dose of IV Lasix  was ordered.  Patient to be admitted for further management. I ordered medication including Solu-Cortef  for empiric treatment of adrenal insufficiency, DuoNeb for pulmonary fibrosis, D50 for hypoglycemia, ceftriaxone  doxycycline  for empiric treatment of pneumonia, Lasix  for diuresis Reevaluation of the patient after these medicines showed that the patient improved I have reviewed the patients home medicines and have made adjustments as needed   Social Determinants of Health:  Has access to outpatient care  CRITICAL CARE Performed by: Bernardino Fireman   Total critical care time: 32 minutes  Critical care time was exclusive of separately billable procedures and treating other patients.  Critical care was necessary to treat or prevent imminent or life-threatening deterioration.  Critical care was time spent personally by me on the following activities: development of treatment plan with patient and/or surrogate as well as nursing, discussions with consultants, evaluation of patient's response to treatment, examination of patient, obtaining history from patient or surrogate, ordering and performing treatments and interventions, ordering and review of laboratory studies, ordering and review of radiographic studies, pulse oximetry and re-evaluation of patient's condition.     Final diagnoses:  Syncope, unspecified syncope type  Acute on chronic respiratory failure with hypoxia (HCC)  Hypoglycemia    ED Discharge Orders     None          Melvenia Motto, MD 06/09/24 1514

## 2024-06-09 NOTE — Progress Notes (Signed)
 Called and spoke with pharmacy at [facility], they are aware that med rec needs to be completed.

## 2024-06-09 NOTE — ED Notes (Signed)
Urinal provided to pt for urine sample

## 2024-06-10 ENCOUNTER — Telehealth (HOSPITAL_COMMUNITY): Payer: Self-pay | Admitting: Pharmacy Technician

## 2024-06-10 ENCOUNTER — Other Ambulatory Visit (HOSPITAL_COMMUNITY): Payer: Self-pay

## 2024-06-10 DIAGNOSIS — R55 Syncope and collapse: Secondary | ICD-10-CM | POA: Diagnosis not present

## 2024-06-10 LAB — CBC
HCT: 38.6 % — ABNORMAL LOW (ref 39.0–52.0)
Hemoglobin: 12.2 g/dL — ABNORMAL LOW (ref 13.0–17.0)
MCH: 29.1 pg (ref 26.0–34.0)
MCHC: 31.6 g/dL (ref 30.0–36.0)
MCV: 92.1 fL (ref 80.0–100.0)
Platelets: 297 K/uL (ref 150–400)
RBC: 4.19 MIL/uL — ABNORMAL LOW (ref 4.22–5.81)
RDW: 16.2 % — ABNORMAL HIGH (ref 11.5–15.5)
WBC: 10.5 K/uL (ref 4.0–10.5)
nRBC: 0 % (ref 0.0–0.2)

## 2024-06-10 LAB — CORTISOL: Cortisol, Plasma: 10.1 ug/dL

## 2024-06-10 LAB — BASIC METABOLIC PANEL WITH GFR
Anion gap: 14 (ref 5–15)
BUN: 25 mg/dL — ABNORMAL HIGH (ref 8–23)
CO2: 23 mmol/L (ref 22–32)
Calcium: 8.8 mg/dL — ABNORMAL LOW (ref 8.9–10.3)
Chloride: 103 mmol/L (ref 98–111)
Creatinine, Ser: 1.27 mg/dL — ABNORMAL HIGH (ref 0.61–1.24)
GFR, Estimated: 58 mL/min — ABNORMAL LOW (ref >60)
Glucose, Bld: 32 mg/dL — CL (ref 70–99)
Potassium: 3.8 mmol/L (ref 3.5–5.1)
Sodium: 140 mmol/L (ref 135–145)

## 2024-06-10 LAB — GLUCOSE, CAPILLARY
Glucose-Capillary: 46 mg/dL — ABNORMAL LOW (ref 70–99)
Glucose-Capillary: 113 mg/dL — ABNORMAL HIGH (ref 70–99)
Glucose-Capillary: 69 mg/dL — ABNORMAL LOW (ref 70–99)
Glucose-Capillary: 91 mg/dL (ref 70–99)

## 2024-06-10 MED ORDER — MIDODRINE HCL 5 MG PO TABS: 5.0000 mg | ORAL_TABLET | Freq: Three times a day (TID) | ORAL | 0 refills | Status: DC

## 2024-06-10 MED ORDER — FAMOTIDINE 20 MG PO TABS
20.0000 mg | ORAL_TABLET | Freq: Every day | ORAL | Status: DC
  Administered 2024-06-10: 20 mg via ORAL
  Filled 2024-06-10: qty 1

## 2024-06-10 MED ORDER — FUROSEMIDE 40 MG PO TABS: 20.0000 mg | ORAL_TABLET | Freq: Every day | ORAL | Status: DC | PRN

## 2024-06-10 MED ORDER — POTASSIUM CHLORIDE ER 10 MEQ PO CPCR: 10.0000 meq | ORAL_CAPSULE | Freq: Every day | ORAL | Status: DC | PRN

## 2024-06-10 MED ORDER — MIDODRINE HCL 5 MG PO TABS: 5.0000 mg | ORAL_TABLET | Freq: Three times a day (TID) | ORAL | Status: DC

## 2024-06-10 MED ORDER — PIRFENIDONE 267 MG PO TABS
534.0000 mg | ORAL_TABLET | Freq: Three times a day (TID) | ORAL | Status: DC
  Administered 2024-06-10: 534 mg via ORAL
  Filled 2024-06-10: qty 2

## 2024-06-10 MED ORDER — PANTOPRAZOLE SODIUM 40 MG PO TBEC
40.0000 mg | DELAYED_RELEASE_TABLET | Freq: Every day | ORAL | Status: DC
Start: 2024-06-10 — End: 2024-06-10
  Administered 2024-06-10: 40 mg via ORAL
  Filled 2024-06-10: qty 1

## 2024-06-10 MED ORDER — ATORVASTATIN CALCIUM 10 MG PO TABS: 10.0000 mg | ORAL_TABLET | Freq: Every day | ORAL | Status: DC

## 2024-06-10 MED ORDER — VITAMIN B-12 1000 MCG PO TABS
1000.0000 ug | ORAL_TABLET | Freq: Every day | ORAL | Status: DC
  Administered 2024-06-10: 1000 ug via ORAL
  Filled 2024-06-10: qty 1

## 2024-06-10 MED ORDER — MIDODRINE HCL 5 MG PO TABS
10.0000 mg | ORAL_TABLET | Freq: Three times a day (TID) | ORAL | Status: DC
  Administered 2024-06-10 (×2): 10 mg via ORAL
  Filled 2024-06-10: qty 2

## 2024-06-10 MED ORDER — GABAPENTIN 300 MG PO CAPS
300.0000 mg | ORAL_CAPSULE | Freq: Two times a day (BID) | ORAL | Status: DC
  Administered 2024-06-10: 300 mg via ORAL
  Filled 2024-06-10: qty 1

## 2024-06-10 MED ORDER — TREPROSTINIL 64 MCG IN POWD
1.0000 | Freq: Four times a day (QID) | RESPIRATORY_TRACT | Status: DC
  Administered 2024-06-10: 1 via RESPIRATORY_TRACT
  Filled 2024-06-10: qty 1

## 2024-06-10 NOTE — Care Management Obs Status (Signed)
 MEDICARE OBSERVATION STATUS NOTIFICATION   Patient Details  Name: Christian Woodard MRN: [id number] Date of Birth: Jun 21, 1946   Medicare Observation Status Notification Given:  Yes    Hoy DELENA Bigness, LCSW 06/10/2024, 12:46 PM

## 2024-06-10 NOTE — Progress Notes (Signed)
 PROGRESS NOTE    [name]  FMW:[id number] DOB: 23-Aug-1946 DOA: 06/09/2024 PCP: Sheryle Carwin, MD    Brief Narrative:  Christian Woodard is a 78 y.o. male with medical history significant of pulmonary fibrosis, pulmonary hypertension, diabetes, chronic diastolic CHF, chronic hypoxic respiratory failure who presents from home after syncopal event. Patient went to his see his PCP today where his blood pressure was found to be low (family reports systolic in the 80s).  Family reports he had an episode of generalized weakness, sat down in a chair, and then passed out.  Daughter reports he stopped breathing for short period time but responded to a sternal rub.  EMS was called.  When EMS arrived he was placed on 8 L nasal cannula and satting in the 80s.  His blood pressure was found to be in the 70s.  He was given IV fluids and placed in Trendelenburg with improvement of blood pressure. In the ER, his CBG was found to be in the 40s and he was given an amp of D50 with improvement.  ER doc was concern for adrenal insufficiency as he was recently on several courses of prednisone  so he was dosed with Solu-Cortef  x 1.  CT scan was obtained of his lungs that questioned fluid overload so he was given a dose of IV Lasix .  TRH was asked to admit for further workup and monitoring.   Of note he was recently hospitalized in May 2025 with a similar episode of worsening hypoxia and syncope.  Workup was done and thought to be vasovagal with exacerbation of his pulmonary hypertension.  He was given steroids and discharged to a skilled nursing facility where he remained for 2 weeks.   Assessment and Plan: Acute on chronic respiratory failure with hypoxia (HCC) CTA chest was negative for PE but showed chronic bilateral interstitial accentuation with hazy ground-glass densities in both lungs potentially from mild pulmonary edema superimposed on chronic interstitial lung disease. -Wean to home 6 L of oxygen via nasal  cannula-- will need more O2 with exertion -Will need follow-up with pulmonologist post-discharge   Syncope Seems like vasovagal.   - Patient has similar episode in May 2025 -orthostatics noemal, PT eval-- home health, a.m. cortisol pending still (a dose of Solu-Medrol  given in the ER) -TSH normal in the ER -Most likely will need to stop patient's ARB for hypotension   Acute on chronic diastolic CHF Patient had elevated BNP on admission.   -Can be a confounder with severe pulmonary hypertension and CKD stage IIIa.  5/25: Echocardiogram consistent with volume overload and pulmonary hypertension, hyperdynamic EF-with EF greater than 75% - IV Lasix  x1 but BP too low to give more   Pulmonary fibrosis (HCC) - Continuing home pirfenidone  - Patient sees Dr. Geronimo as outpatient - Recently on 2 different steroid tapers, check cortisol level   -Pulmonary hypertension in the setting of pulmonary fibrosis - Defer to outpatient follow-up   Type 2 diabetes mellitus with hypo-glycemia -Had episode of hypoglycemia in the ER - Will use sliding scale insulin  for now only -Consider resumption of a long-acting versus perhaps Tradjenta in place of glipizide  (zero copay)   CKD stage 3a, GFR 45-59 ml/min (HCC) -Trend creatinine, daily labs     DVT prophylaxis: Place TED hose Start: 06/10/24 1114 enoxaparin  (LOVENOX ) injection 40 mg Start: 06/09/24 2200    Code Status: Full Code Family Communication: at bedside  Disposition Plan:  Level of care: Telemetry Status is: Observation  Consultants:  none   Subjective: Feeling better-- having trouble with portable O2 lasting long enough  Objective: Vitals:   06/09/24 1721 06/09/24 1836 06/09/24 2130 06/10/24 0130  BP: (!) 85/66 109/68 (!) 130/101 (!) 87/60  Pulse: (!) 47 (!) 105 (!) 101 82  Resp: 18 20 20 19   Temp: 97.7 F (36.5 C)  97.7 F (36.5 C) 97.6 F (36.4 C)  TempSrc: Oral  Oral Oral  SpO2: 90% 95% (!) 88% 93%  Weight:  76.2 kg   74.6 kg  Height: 5' 6 (1.676 m)       Intake/Output Summary (Last 24 hours) at 06/10/2024 1338 Last data filed at 06/09/2024 1700 Gross per 24 hour  Intake 100.16 ml  Output --  Net 100.16 ml   Filed Weights   06/09/24 1304 06/09/24 1721 06/10/24 0130  Weight: 72 kg 76.2 kg 74.6 kg    Examination:   General: Appearance:     Overweight male in no acute distress     Lungs:     respirations unlabored, on CN, diminished  Heart:    Normal heart rate.   MS:   All extremities are intact.    Neurologic:   Awake, alert       Data Reviewed: I have personally reviewed following labs and imaging studies  CBC: Recent Labs  Lab 06/09/24 1309 06/10/24 0419  WBC 10.8* 10.5  NEUTROABS 8.4*  --   HGB 13.1 12.2*  HCT 40.2 38.6*  MCV 93.9 92.1  PLT 322 297   Basic Metabolic Panel: Recent Labs  Lab 06/09/24 1309 06/10/24 0419  NA 140 140  K 4.0 3.8  CL 103 103  CO2 24 23  GLUCOSE 60* 32*  BUN 22 25*  CREATININE 1.29* 1.27*  CALCIUM  8.8* 8.8*  MG 1.9  --    GFR: Estimated Creatinine Clearance: 43.3 mL/min (A) (by C-G formula based on SCr of 1.27 mg/dL (H)). Liver Function Tests: Recent Labs  Lab 06/09/24 1309  AST 22  ALT 13  ALKPHOS 71  BILITOT 0.9  PROT 6.9  ALBUMIN 3.5   No results for input(s): LIPASE, AMYLASE in the last 168 hours. No results for input(s): AMMONIA in the last 168 hours. Coagulation Profile: Recent Labs  Lab 06/09/24 1309  INR 1.3*   Cardiac Enzymes: No results for input(s): CKTOTAL, CKMB, CKMBINDEX, TROPONINI in the last 168 hours. BNP (last 3 results) Recent Labs    01/15/24 1156  PROBNP 165.0*   HbA1C: No results for input(s): HGBA1C in the last 72 hours. CBG: Recent Labs  Lab 06/09/24 2122 06/10/24 0801 06/10/24 0838 06/10/24 0914 06/10/24 1146  GLUCAP 263* 46* 69* 113* 91   Lipid Profile: No results for input(s): CHOL, HDL, LDLCALC, TRIG, CHOLHDL, LDLDIRECT in the last 72  hours. Thyroid  Function Tests: Recent Labs    06/09/24 1320  TSH 4.364   Anemia Panel: No results for input(s): VITAMINB12, FOLATE, FERRITIN, TIBC, IRON , RETICCTPCT in the last 72 hours. Sepsis Labs: Recent Labs  Lab 06/09/24 1309 06/09/24 1449  LATICACIDVEN 2.0* 2.0*    Recent Results (from the past 240 hours)  Blood Culture (routine x 2)     Status: None (Preliminary result)   Collection Time: 06/09/24  1:14 PM   Specimen: BLOOD RIGHT FOREARM  Result Value Ref Range Status   Specimen Description BLOOD RIGHT FOREARM  Final   Special Requests   Final    BOTTLES DRAWN AEROBIC AND ANAEROBIC Blood Culture adequate volume   Culture  Final    NO GROWTH < 24 HOURS Performed at [facility], [address], [place], [place] [address]    Report Status PENDING  Incomplete  Blood Culture (routine x 2)     Status: None (Preliminary result)   Collection Time: 06/09/24  1:50 PM   Specimen: BLOOD  Result Value Ref Range Status   Specimen Description BLOOD BLOOD LEFT ARM  Final   Special Requests   Final    BOTTLES DRAWN AEROBIC AND ANAEROBIC Blood Culture adequate volume   Culture   Final    NO GROWTH < 24 HOURS Performed at [facility], [address] [place], [place] [address]    Report Status PENDING  Incomplete  Resp panel by RT-PCR (RSV, Flu A&B, Covid) Anterior Nasal Swab     Status: None   Collection Time: 06/09/24  2:00 PM   Specimen: Anterior Nasal Swab  Result Value Ref Range Status   SARS Coronavirus 2 by RT PCR NEGATIVE NEGATIVE Final    Comment: (NOTE) SARS-CoV-2 target nucleic acids are NOT DETECTED.  The SARS-CoV-2 RNA is generally detectable in upper respiratory specimens during the acute phase of infection. The lowest concentration of SARS-CoV-2 viral copies this assay can detect is 138 copies/mL. A negative result does not preclude SARS-Cov-2 infection and should not be used as the sole basis for treatment or other patient management decisions.  A negative result may occur with  improper specimen collection/handling, submission of specimen other than nasopharyngeal swab, presence of viral mutation(s) within the areas targeted by this assay, and inadequate number of viral copies(<138 copies/mL). A negative result must be combined with clinical observations, patient history, and epidemiological information. The expected result is Negative.  Fact Sheet for Patients:  BloggerCourse.com  Fact Sheet for Healthcare Providers:  SeriousBroker.it  This test is no t yet approved or cleared by the [place]  FDA and  has been authorized for detection and/or diagnosis of SARS-CoV-2 by FDA under an Emergency Use Authorization (EUA). This EUA will remain  in effect (meaning this test can be used) for the duration of the COVID-19 declaration under Section 564(b)(1) of the Act, 21 U.S.C.section 360bbb-3(b)(1), unless the authorization is terminated  or revoked sooner.       Influenza A by PCR NEGATIVE NEGATIVE Final   Influenza B by PCR NEGATIVE NEGATIVE Final    Comment: (NOTE) The Xpert Xpress SARS-CoV-2/FLU/RSV plus assay is intended as an aid in the diagnosis of influenza from Nasopharyngeal swab specimens and should not be used as a sole basis for treatment. Nasal washings and aspirates are unacceptable for Xpert Xpress SARS-CoV-2/FLU/RSV testing.  Fact Sheet for Patients: BloggerCourse.com  Fact Sheet for Healthcare Providers: SeriousBroker.it  This test is not yet approved or cleared by the [place]  FDA and has been authorized for detection and/or diagnosis of SARS-CoV-2 by FDA under an Emergency Use Authorization (EUA). This EUA will remain in effect (meaning this test can be used) for the duration of the COVID-19 declaration under Section 564(b)(1) of the Act, 21 U.S.C. section 360bbb-3(b)(1), unless the authorization  is terminated or revoked.     Resp Syncytial Virus by PCR NEGATIVE NEGATIVE Final    Comment: (NOTE) Fact Sheet for Patients: BloggerCourse.com  Fact Sheet for Healthcare Providers: SeriousBroker.it  This test is not yet approved or cleared by the [place]  FDA and has been authorized for detection and/or diagnosis of SARS-CoV-2 by FDA under an Emergency Use Authorization (EUA). This EUA will remain in effect (meaning this  test can be used) for the duration of the COVID-19 declaration under Section 564(b)(1) of the Act, 21 U.S.C. section 360bbb-3(b)(1), unless the authorization is terminated or revoked.  Performed at [facility], [address] [place], [place] [address]          Radiology Studies: CT Angio Chest PE W and/or Wo Contrast Result Date: 06/09/2024 CLINICAL DATA:  Syncope in shortness of breath EXAM: CT ANGIOGRAPHY CHEST WITH CONTRAST TECHNIQUE: Multidetector CT imaging of the chest was performed using the standard protocol during bolus administration of intravenous contrast. Multiplanar CT image reconstructions and MIPs were obtained to evaluate the vascular anatomy. RADIATION DOSE REDUCTION: This exam was performed according to the departmental dose-optimization program which includes automated exposure control, adjustment of the mA and/or kV according to patient size and/or use of iterative reconstruction technique. CONTRAST:  75mL OMNIPAQUE  IOHEXOL  350 MG/ML SOLN COMPARISON:  04/06/2024 FINDINGS: Cardiovascular: No filling defect is identified in the pulmonary arterial tree to suggest pulmonary embolus. Moderate cardiomegaly particularly with right heart enlargement. Coronary, aortic arch, and branch vessel atherosclerotic vascular disease. Prominent main pulmonary artery favoring pulmonary arterial hypertension. Mediastinum/Nodes: Stable mild mediastinal adenopathy including right and left paratracheal lymph nodes and  AP window lymph nodes as well as borderline prominent right subcarinal and infrahilar lymph nodes. Index AP window lymph node 1.2 cm in short axis on image 32 series 1, formerly 1.1 cm. Lungs/Pleura: Emphysema. Chronic bilateral interstitial accentuation with hazy ground-glass densities in both lungs potentially from mild pulmonary edema superimposed on chronic interstitial lung disease. Upper Abdomen: Mild reflux of contrast into the hepatic veins. Cholecystectomy. Narrowing of the proximal celiac trunk primarily due to median arcuate ligament syndrome and to a lesser degree due to atherosclerosis. Atheromatous plaque proximally in the SMA. Neither vessel appears overtly occluded. Musculoskeletal: Unremarkable Review of the MIP images confirms the above findings. IMPRESSION: 1. No filling defect is identified in the pulmonary arterial tree to suggest pulmonary embolus. 2. Moderate cardiomegaly particularly with right heart enlargement. Mild reflux of contrast into the hepatic veins. 3. Prominent main pulmonary artery favoring pulmonary arterial hypertension. 4. Chronic bilateral interstitial accentuation with hazy ground-glass densities in both lungs potentially from mild pulmonary edema superimposed on chronic interstitial lung disease. 5. Stable mild mediastinal adenopathy. 6. Narrowing of the proximal celiac trunk primarily due to median arcuate ligament syndrome and to a lesser degree due to atherosclerosis. 7. Aortic Atherosclerosis (ICD10-I70.0) and Emphysema (ICD10-J43.9). Electronically Signed   By: Ryan Salvage M.D.   On: 06/09/2024 14:50   CT Head Wo Contrast Result Date: 06/09/2024 CLINICAL DATA:  04/06/2024 EXAM: CT HEAD WITHOUT CONTRAST TECHNIQUE: Contiguous axial images were obtained from the base of the skull through the vertex without intravenous contrast. RADIATION DOSE REDUCTION: This exam was performed according to the departmental dose-optimization program which includes automated  exposure control, adjustment of the mA and/or kV according to patient size and/or use of iterative reconstruction technique. COMPARISON:  04/06/2024 FINDINGS: Brain: Mild age related volume loss. No acute intracranial abnormality. Specifically, no hemorrhage, hydrocephalus, mass lesion, acute infarction, or significant intracranial injury. Vascular: No hyperdense vessel or unexpected calcification. Skull: No acute calvarial abnormality. Sinuses/Orbits: No acute finding Other: None IMPRESSION: No acute intracranial abnormality. Electronically Signed   By: Franky Crease M.D.   On: 06/09/2024 14:42   DG Chest Port 1 View Result Date: 06/09/2024 CLINICAL DATA:  Suspected sepsis EXAM: PORTABLE CHEST 1 VIEW COMPARISON:  Apr 10, 2024 FINDINGS: No focal consolidation. Unchanged chronic interstitial opacities. No pleural effusions.  No pneumothorax. Unchanged cardiomegaly. No acute osseous findings. IMPRESSION: No acute findings.  Unchanged cardiomegaly. Electronically Signed   By: Michaeline Blanch M.D.   On: 06/09/2024 13:36        Scheduled Meds:  atorvastatin   10 mg Oral QHS   cyanocobalamin   1,000 mcg Oral Daily   docusate sodium   100 mg Oral BID   enoxaparin  (LOVENOX ) injection  40 mg Subcutaneous Q24H   famotidine   20 mg Oral Daily   gabapentin   300 mg Oral BID   insulin  aspart  0-5 Units Subcutaneous QHS   insulin  aspart  0-9 Units Subcutaneous TID WC   latanoprost   1 drop Both Eyes QHS   midodrine   10 mg Oral TID WC   pantoprazole   40 mg Oral Daily   Pirfenidone   534 mg Oral TID   Treprostinil   1 puff Inhalation QID   Continuous Infusions:   LOS: 0 days    Time spent: 45 minutes spent on chart review, discussion with nursing staff, consultants, updating family and interview/physical exam; more than 50% of that time was spent in counseling and/or coordination of care.    Harlene RAYMOND Bowl, DO [facility] Available via Epic secure chat 7am-7pm After these hours, please refer to  coverage provider listed on [email or web address] 06/10/2024, 1:38 PM

## 2024-06-10 NOTE — Progress Notes (Signed)
 Nsg Discharge Note  Admit Date:  06/09/2024 Discharge date: 06/10/2024   Christian Woodard to be D/C'd Home per MD order.  AVS completed.  Copy for chart, and copy for patient signed, and dated. Patient/caregiver able to verbalize understanding.  Discharge Medication: Allergies as of 06/10/2024   No Known Allergies      Medication List     PAUSE taking these medications    Lantus  SoloStar 100 UNIT/ML Solostar Pen Wait to take this until your doctor or other care provider tells you to start again. Generic drug: insulin  glargine Inject 20 Units into the skin 2 (two) times daily.   losartan  25 MG tablet Wait to take this until your doctor or other care provider tells you to start again. Commonly known as: COZAAR  Take 1 tablet (25 mg total) by mouth daily.       STOP taking these medications    glipiZIDE  5 MG 24 hr tablet Commonly known as: GLUCOTROL  XL       TAKE these medications    acetaminophen  325 MG tablet Commonly known as: TYLENOL  Take 2 tablets (650 mg total) by mouth every 6 (six) hours as needed for mild pain (pain score 1-3) (or Fever >/= 101).   aspirin  EC 81 MG tablet Take 1 tablet (81 mg total) by mouth daily with breakfast. Swallow whole.   atorvastatin  10 MG tablet Commonly known as: LIPITOR Take 10 mg by mouth at bedtime.   cyanocobalamin  1000 MCG tablet Commonly known as: VITAMIN B12 Take 1,000 mcg by mouth daily.   famotidine  20 MG tablet Commonly known as: PEPCID  Take 1 tablet (20 mg total) by mouth daily.   furosemide  40 MG tablet Commonly known as: Lasix  Take 0.5 tablets (20 mg total) by mouth daily as needed for fluid. What changed:  how much to take when to take this reasons to take this   gabapentin  300 MG capsule Commonly known as: Neurontin  Take 1 capsule (300 mg total) by mouth 2 (two) times daily.   IRON  27 PO Take 1 tablet by mouth daily.   Iyuzeh  0.005 % Soln Generic drug: Latanoprost  PF Apply 1 drop to eye at  bedtime.   metFORMIN  500 MG tablet Commonly known as: GLUCOPHAGE  Take 500 mg by mouth 2 (two) times daily.   midodrine  5 MG tablet Commonly known as: PROAMATINE  Take 1 tablet (5 mg total) by mouth 3 (three) times daily with meals.   NovoLOG  FlexPen 100 UNIT/ML FlexPen Generic drug: insulin  aspart Inject 0-12 Units into the skin See admin instructions. Short acting insulin  per sliding scale 0-10 ---:-- Insulin  injection 0-10 Units 0-10 Units Subcutaneous, 3 times daily with meals CBG 70 - 120: 0 unit CBG 121 - 150: 0 unit  CBG 151 - 200: 2 unit CBG 201 - 250: 4 units CBG 251 - 300: 6 units CBG 301 - 350: 8 units  CBG 351 - 400: 10 units  CBG > 400: 12 units   pantoprazole  40 MG tablet Commonly known as: PROTONIX  TAKE 1 TABLET BY MOUTH ONE TIME DAILY 30-60 MINUTES BEFORE BREAKFAST What changed: See the new instructions.   Pirfenidone  267 MG Tabs Take 2 tablets (534 mg total) by mouth in the morning, at noon, and at bedtime. **low dose as maintenance**   potassium chloride  10 MEQ CR capsule Commonly known as: MICRO-K  Take 1 capsule (10 mEq total) by mouth daily as needed (when you take lasix ). What changed:  when to take this reasons to take this  Tyvaso  DPI Maintenance Kit 64 MCG Powd Generic drug: Treprostinil  Inhale 1 puff into the lungs in the morning, at noon, in the evening, and at bedtime.               Durable Medical Equipment  (From admission, onward)           Start     Ordered   06/10/24 1213  For home use only DME oxygen  Once       Question Answer Comment  Length of Need Lifetime   Mode or (Route) Nasal cannula   Liters per Minute 8   Frequency Continuous (stationary and portable oxygen unit needed)   Oxygen conserving device Yes   Oxygen delivery system Gas      06/10/24 1213            Discharge Assessment: Vitals:   06/10/24 0130 06/10/24 1300  BP: (!) 87/60 110/67  Pulse: 82 89  Resp: 19 18  Temp: 97.6 F (36.4 C) 97.6 F (36.4  C)  SpO2: 93% 91%   Skin clean, dry and intact without evidence of skin break down, no evidence of skin tears noted. IV catheter discontinued intact. Site without signs and symptoms of complications - no redness or edema noted at insertion site, patient denies c/o pain - only slight tenderness at site.  Dressing with slight pressure applied.  D/c Instructions-Education: Discharge instructions given to patient/family with verbalized understanding. D/c education completed with patient/family including follow up instructions, medication list, d/c activities limitations if indicated, with other d/c instructions as indicated by MD - patient able to verbalize understanding, all questions fully answered. Patient instructed to return to ED, call 911, or call MD for any changes in condition.  Patient escorted via WC, and D/C home via private auto.  Christian JULIANNA Casey, RN 06/10/2024 4:19 PM

## 2024-06-10 NOTE — TOC Transition Note (Signed)
 Transition of Care [facility]) - Discharge Note   Patient Details  Name: Christian Woodard MRN: [id number] Date of Birth: 25-May-1946  [facility]) CM/SW Contact:  Hoy DELENA Bigness, LCSW Phone Number: 06/10/2024, 12:47 PM   Clinical Narrative:    Pt from home with spouse. Pt is active with [facility] for HHPT/OT. ROC orders have been placed. Pt on 4L of O2 at baseline provided by [facility]. Pt has needed to increase liter flow due to increased SOB. Pt with new qualification for 8L of O2. Pt has O2 concentrator at home that can be increased to up to 10L. Pt has travel O2 that is only able to increase to 5L. Spoke with Mitch at [facility] who will have travel O2 tanks that can be increased up to 10L delivered to pt's home. Pt's spouse to provide transport at discharge. No further TOC needs identified.    Final next level of care: Home w Home Health Services Barriers to Discharge: Barriers Resolved   Patient Goals and CMS Choice Patient states their goals for this hospitalization and ongoing recovery are:: to go home CMS Medicare.gov Compare Post Acute Care list provided to:: Patient Choice offered to / list presented to : Patient      Discharge Placement                       Discharge Plan and Services Additional resources added to the After Visit Summary for                  DME Arranged: Oxygen DME Agency: [facility] Date DME Agency Contacted: 06/10/24 Time DME Agency Contacted: 1246 Representative spoke with at DME Agency: Mitch            Social Drivers of Health (SDOH) Interventions SDOH Screenings   Food Insecurity: No Food Insecurity (06/09/2024)  Housing: Low Risk  (06/09/2024)  Transportation Needs: No Transportation Needs (06/09/2024)  Utilities: Not At Risk (06/09/2024)  Social Connections: Socially Integrated (06/09/2024)  Tobacco Use: Medium Risk (06/09/2024)     Readmission Risk Interventions    04/08/2024    1:51 PM 04/07/2024     7:38 AM  Readmission Risk Prevention Plan  Transportation Screening Complete Complete  Home Care Screening Complete Complete  Medication Review (RN CM) Complete Complete

## 2024-06-10 NOTE — Discharge Summary (Signed)
**Note Christian-Identified via Obfuscation**  Physician Discharge Summary  Christian Woodard FMW:[id number] DOB: 14-Feb-1946 DOA: 06/09/2024  PCP: Christian Carwin, MD  Admit date: 06/09/2024 Discharge date: 06/10/2024  Admitted From: Home Discharge disposition: Home   Recommendations for Outpatient Follow-Up:   Updated oxygen requirements with DME company from May 2025-6 L at rest Have stopped ARB as well as glipizide  due to hypotension and hypoglycemia-consider using Tradjenta if blood sugars need further treatment Resume home health with addition of OT BMP 1 week  Monitor blood pressure Tenuous volume status so will need close follow-up with PCP and pulmonology for worsening lungs, consider palliative care referral   Discharge Diagnosis:   Principal Problem:   Vasovagal syncope    Discharge Condition: Improved.  Diet recommendation: Low sodium, heart healthy.   Wound care: None.  Code status: Full.   History of Present Illness:   Christian Woodard is a 78 y.o. male with medical history significant of pulmonary fibrosis, pulmonary hypertension, diabetes, chronic diastolic CHF, chronic hypoxic respiratory failure who presents from home after syncopal event. Patient went to his see his PCP today where his blood pressure was found to be low (family reports systolic in the 80s).  Family reports he had an episode of generalized weakness, sat down in a chair, and then passed out.  Daughter reports he stopped breathing for short period time but responded to a sternal rub.  EMS was called.  When EMS arrived he was placed on 8 L nasal cannula and satting in the 80s.  His blood pressure was found to be in the 70s.  He was given IV fluids and placed in Trendelenburg with improvement of blood pressure. In the ER, his CBG was found to be in the 40s and he was given an amp of D50 with improvement.  ER doc was concern for adrenal insufficiency as he was recently on several courses of prednisone  so he was dosed with Solu-Cortef  x 1.   CT scan was obtained of his lungs that questioned fluid overload so he was given a dose of IV Lasix .  TRH was asked to admit for further workup and monitoring.   Of note he was recently hospitalized in May 2025 with a similar episode of worsening hypoxia and syncope.  Workup was done and thought to be vasovagal with exacerbation of his pulmonary hypertension.  He was given steroids and discharged to a skilled nursing facility where he remained for 2 weeks.     Hospital Course by Problem:   Acute on chronic respiratory failure with hypoxia (HCC) CTA chest was negative for PE but showed chronic bilateral interstitial accentuation with hazy ground-glass densities in both lungs potentially from mild pulmonary edema superimposed on chronic interstitial lung disease. -Wean to home 6 L of oxygen via nasal cannula-- will need more O2 with exertion -Will need follow-up with pulmonologist post-discharge   Syncope Seemsvasovagal.   - Patient has similar episode in May 2025 -orthostatics normal, PT eval-- home health, a.m. cortisol normal -TSH normal in the ER -Most likely will need to stop patient's ARB for hypotension--trial of midodrine  but monitoring volume status closely via PCP   Acute on chronic diastolic CHF Patient had elevated BNP on admission.   -Can be a confounder with severe pulmonary hypertension and CKD stage IIIa.  5/25: Echocardiogram consistent with volume overload and pulmonary hypertension, hyperdynamic EF-with EF greater than 75% - IV Lasix  x1 but BP too low to give more, compression stockings for lower extremity edema   Pulmonary  fibrosis (HCC) - Continuing home pirfenidone  - Patient sees Christian Woodard as outpatient   -Pulmonary hypertension in the setting of pulmonary fibrosis - Defer to outpatient follow-up   Type 2 diabetes mellitus with hypo-glycemia -Had episode of hypoglycemia in the ER - Will use sliding scale insulin  for now only -Consider resumption of a  long-acting versus perhaps Tradjenta in place of glipizide  (zero copay)   CKD stage 3a, GFR 45-59 ml/min (HCC) -Trend creatinine, daily labs      Medical Consultants:      Discharge Exam:    Vitals:   06/09/24 1836 06/09/24 2130 06/10/24 0130 06/10/24 1300  BP: 109/68 (!) 130/101 (!) 87/60 110/67  Pulse: (!) 105 (!) 101 82 89  Resp: 20 20 19 18   Temp:  97.7 F (36.5 C) 97.6 F (36.4 C) 97.6 F (36.4 C)  TempSrc:  Oral Oral Axillary  SpO2: 95% (!) 88% 93% 91%  Weight:   74.6 kg   Height:        General exam: Appears calm and comfortable.  Feeling much better today has worked with therapy and ambulatory specialist several times and has been out of bed without any issues or dizziness   The results of significant diagnostics from this hospitalization (including imaging, microbiology, ancillary and laboratory) are listed below for reference.     Procedures and Diagnostic Studies:   CT Angio Chest PE W and/or Wo Contrast Result Date: 06/09/2024 CLINICAL DATA:  Syncope in shortness of breath EXAM: CT ANGIOGRAPHY CHEST WITH CONTRAST TECHNIQUE: Multidetector CT imaging of the chest was performed using the standard protocol during bolus administration of intravenous contrast. Multiplanar CT image reconstructions and MIPs were obtained to evaluate the vascular anatomy. RADIATION DOSE REDUCTION: This exam was performed according to the departmental dose-optimization program which includes automated exposure control, adjustment of the mA and/or kV according to patient size and/or use of iterative reconstruction technique. CONTRAST:  75mL OMNIPAQUE  IOHEXOL  350 MG/ML SOLN COMPARISON:  04/06/2024 FINDINGS: Cardiovascular: No filling defect is identified in the pulmonary arterial tree to suggest pulmonary embolus. Moderate cardiomegaly particularly with right heart enlargement. Coronary, aortic arch, and branch vessel atherosclerotic vascular disease. Prominent main pulmonary artery favoring  pulmonary arterial hypertension. Mediastinum/Nodes: Stable mild mediastinal adenopathy including right and left paratracheal lymph nodes and AP window lymph nodes as well as borderline prominent right subcarinal and infrahilar lymph nodes. Index AP window lymph node 1.2 cm in short axis on image 32 series 1, formerly 1.1 cm. Lungs/Pleura: Emphysema. Chronic bilateral interstitial accentuation with hazy ground-glass densities in both lungs potentially from mild pulmonary edema superimposed on chronic interstitial lung disease. Upper Abdomen: Mild reflux of contrast into the hepatic veins. Cholecystectomy. Narrowing of the proximal celiac trunk primarily due to median arcuate ligament syndrome and to a lesser degree due to atherosclerosis. Atheromatous plaque proximally in the SMA. Neither vessel appears overtly occluded. Musculoskeletal: Unremarkable Review of the MIP images confirms the above findings. IMPRESSION: 1. No filling defect is identified in the pulmonary arterial tree to suggest pulmonary embolus. 2. Moderate cardiomegaly particularly with right heart enlargement. Mild reflux of contrast into the hepatic veins. 3. Prominent main pulmonary artery favoring pulmonary arterial hypertension. 4. Chronic bilateral interstitial accentuation with hazy ground-glass densities in both lungs potentially from mild pulmonary edema superimposed on chronic interstitial lung disease. 5. Stable mild mediastinal adenopathy. 6. Narrowing of the proximal celiac trunk primarily due to median arcuate ligament syndrome and to a lesser degree due to atherosclerosis. 7. Aortic Atherosclerosis (ICD10-I70.0) and Emphysema (  ICD10-J43.9). Electronically Signed   By: Ryan Salvage M.D.   On: 06/09/2024 14:50   CT Head Wo Contrast Result Date: 06/09/2024 CLINICAL DATA:  04/06/2024 EXAM: CT HEAD WITHOUT CONTRAST TECHNIQUE: Contiguous axial images were obtained from the base of the skull through the vertex without intravenous  contrast. RADIATION DOSE REDUCTION: This exam was performed according to the departmental dose-optimization program which includes automated exposure control, adjustment of the mA and/or kV according to patient size and/or use of iterative reconstruction technique. COMPARISON:  04/06/2024 FINDINGS: Brain: Mild age related volume loss. No acute intracranial abnormality. Specifically, no hemorrhage, hydrocephalus, mass lesion, acute infarction, or significant intracranial injury. Vascular: No hyperdense vessel or unexpected calcification. Skull: No acute calvarial abnormality. Sinuses/Orbits: No acute finding Other: None IMPRESSION: No acute intracranial abnormality. Electronically Signed   By: Franky Crease M.D.   On: 06/09/2024 14:42   DG Chest Port 1 View Result Date: 06/09/2024 CLINICAL DATA:  Suspected sepsis EXAM: PORTABLE CHEST 1 VIEW COMPARISON:  Apr 10, 2024 FINDINGS: No focal consolidation. Unchanged chronic interstitial opacities. No pleural effusions. No pneumothorax. Unchanged cardiomegaly. No acute osseous findings. IMPRESSION: No acute findings.  Unchanged cardiomegaly. Electronically Signed   By: Michaeline Blanch M.D.   On: 06/09/2024 13:36     Labs:   Basic Metabolic Panel: Recent Labs  Lab 06/09/24 1309 06/10/24 0419  NA 140 140  K 4.0 3.8  CL 103 103  CO2 24 23  GLUCOSE 60* 32*  BUN 22 25*  CREATININE 1.29* 1.27*  CALCIUM  8.8* 8.8*  MG 1.9  --    GFR Estimated Creatinine Clearance: 43.3 mL/min (A) (by C-G formula based on SCr of 1.27 mg/dL (H)). Liver Function Tests: Recent Labs  Lab 06/09/24 1309  AST 22  ALT 13  ALKPHOS 71  BILITOT 0.9  PROT 6.9  ALBUMIN 3.5   No results for input(s): LIPASE, AMYLASE in the last 168 hours. No results for input(s): AMMONIA in the last 168 hours. Coagulation profile Recent Labs  Lab 06/09/24 1309  INR 1.3*    CBC: Recent Labs  Lab 06/09/24 1309 06/10/24 0419  WBC 10.8* 10.5  NEUTROABS 8.4*  --   HGB 13.1 12.2*   HCT 40.2 38.6*  MCV 93.9 92.1  PLT 322 297   Cardiac Enzymes: No results for input(s): CKTOTAL, CKMB, CKMBINDEX, TROPONINI in the last 168 hours. BNP: Invalid input(s): POCBNP CBG: Recent Labs  Lab 06/09/24 2122 06/10/24 0801 06/10/24 0838 06/10/24 0914 06/10/24 1146  GLUCAP 263* 46* 69* 113* 91   D-Dimer No results for input(s): DDIMER in the last 72 hours. Hgb A1c No results for input(s): HGBA1C in the last 72 hours. Lipid Profile No results for input(s): CHOL, HDL, LDLCALC, TRIG, CHOLHDL, LDLDIRECT in the last 72 hours. Thyroid  function studies Recent Labs    06/09/24 1320  TSH 4.364   Anemia work up No results for input(s): VITAMINB12, FOLATE, FERRITIN, TIBC, IRON , RETICCTPCT in the last 72 hours. Microbiology Recent Results (from the past 240 hours)  Blood Culture (routine x 2)     Status: None (Preliminary result)   Collection Time: 06/09/24  1:14 PM   Specimen: BLOOD RIGHT FOREARM  Result Value Ref Range Status   Specimen Description BLOOD RIGHT FOREARM  Final   Special Requests   Final    BOTTLES DRAWN AEROBIC AND ANAEROBIC Blood Culture adequate volume   Culture   Final    NO GROWTH < 24 HOURS Performed at [facility], [address], [place],  [place] [address]    Report Status PENDING  Incomplete  Blood Culture (routine x 2)     Status: None (Preliminary result)   Collection Time: 06/09/24  1:50 PM   Specimen: BLOOD  Result Value Ref Range Status   Specimen Description BLOOD BLOOD LEFT ARM  Final   Special Requests   Final    BOTTLES DRAWN AEROBIC AND ANAEROBIC Blood Culture adequate volume   Culture   Final    NO GROWTH < 24 HOURS Performed at [facility], [address] [place], [place] [address]    Report Status PENDING  Incomplete  Resp panel by RT-PCR (RSV, Flu A&B, Covid) Anterior Nasal Swab     Status: None   Collection Time: 06/09/24  2:00 PM   Specimen: Anterior Nasal Swab  Result Value Ref Range  Status   SARS Coronavirus 2 by RT PCR NEGATIVE NEGATIVE Final    Comment: (NOTE) SARS-CoV-2 target nucleic acids are NOT DETECTED.  The SARS-CoV-2 RNA is generally detectable in upper respiratory specimens during the acute phase of infection. The lowest concentration of SARS-CoV-2 viral copies this assay can detect is 138 copies/mL. A negative result does not preclude SARS-Cov-2 infection and should not be used as the sole basis for treatment or other patient management decisions. A negative result may occur with  improper specimen collection/handling, submission of specimen other than nasopharyngeal swab, presence of viral mutation(s) within the areas targeted by this assay, and inadequate number of viral copies(<138 copies/mL). A negative result must be combined with clinical observations, patient history, and epidemiological information. The expected result is Negative.  Fact Sheet for Patients:  BloggerCourse.com  Fact Sheet for Healthcare Providers:  SeriousBroker.it  This test is no t yet approved or cleared by the [place]  FDA and  has been authorized for detection and/or diagnosis of SARS-CoV-2 by FDA under an Emergency Use Authorization (EUA). This EUA will remain  in effect (meaning this test can be used) for the duration of the COVID-19 declaration under Section 564(b)(1) of the Act, 21 U.S.C.section 360bbb-3(b)(1), unless the authorization is terminated  or revoked sooner.       Influenza A by PCR NEGATIVE NEGATIVE Final   Influenza B by PCR NEGATIVE NEGATIVE Final    Comment: (NOTE) The Xpert Xpress SARS-CoV-2/FLU/RSV plus assay is intended as an aid in the diagnosis of influenza from Nasopharyngeal swab specimens and should not be used as a sole basis for treatment. Nasal washings and aspirates are unacceptable for Xpert Xpress SARS-CoV-2/FLU/RSV testing.  Fact Sheet for  Patients: BloggerCourse.com  Fact Sheet for Healthcare Providers: SeriousBroker.it  This test is not yet approved or cleared by the [place]  FDA and has been authorized for detection and/or diagnosis of SARS-CoV-2 by FDA under an Emergency Use Authorization (EUA). This EUA will remain in effect (meaning this test can be used) for the duration of the COVID-19 declaration under Section 564(b)(1) of the Act, 21 U.S.C. section 360bbb-3(b)(1), unless the authorization is terminated or revoked.     Resp Syncytial Virus by PCR NEGATIVE NEGATIVE Final    Comment: (NOTE) Fact Sheet for Patients: BloggerCourse.com  Fact Sheet for Healthcare Providers: SeriousBroker.it  This test is not yet approved or cleared by the [place]  FDA and has been authorized for detection and/or diagnosis of SARS-CoV-2 by FDA under an Emergency Use Authorization (EUA). This EUA will remain in effect (meaning this test can be used) for the duration of the COVID-19 declaration under Section 564(b)(1) of the Act, 21  U.S.C. section 360bbb-3(b)(1), unless the authorization is terminated or revoked.  Performed at [facility], [address] [place], [place] [address]      Discharge Instructions:   Discharge Instructions     Diet - low sodium heart healthy   Complete by: As directed    Discharge instructions   Complete by: As directed    Have adjusted your oxygen levels Daily weights and call PCP if gaining more than 2 pounds in a day or from your baseline Can use compression stockings on your lower extremities if going to be up right and moving around Resume home health Would stop your long-acting insulin  as well as your glipizide -if blood sugars are remaining greater than 200 consistently can speak to your PCP about possibly using Tradjenta as your co-pay is 0   Increase activity slowly   Complete  by: As directed       Allergies as of 06/10/2024   No Known Allergies      Medication List     PAUSE taking these medications    Lantus  SoloStar 100 UNIT/ML Solostar Pen Wait to take this until your doctor or other care provider tells you to start again. Generic drug: insulin  glargine Inject 20 Units into the skin 2 (two) times daily.   losartan  25 MG tablet Wait to take this until your doctor or other care provider tells you to start again. Commonly known as: COZAAR  Take 1 tablet (25 mg total) by mouth daily.       STOP taking these medications    glipiZIDE  5 MG 24 hr tablet Commonly known as: GLUCOTROL  XL       TAKE these medications    acetaminophen  325 MG tablet Commonly known as: TYLENOL  Take 2 tablets (650 mg total) by mouth every 6 (six) hours as needed for mild pain (pain score 1-3) (or Fever >/= 101).   aspirin  EC 81 MG tablet Take 1 tablet (81 mg total) by mouth daily with breakfast. Swallow whole.   atorvastatin  10 MG tablet Commonly known as: LIPITOR Take 10 mg by mouth at bedtime.   cyanocobalamin  1000 MCG tablet Commonly known as: VITAMIN B12 Take 1,000 mcg by mouth daily.   famotidine  20 MG tablet Commonly known as: PEPCID  Take 1 tablet (20 mg total) by mouth daily.   furosemide  40 MG tablet Commonly known as: Lasix  Take 0.5 tablets (20 mg total) by mouth daily as needed for fluid. What changed:  how much to take when to take this reasons to take this   gabapentin  300 MG capsule Commonly known as: Neurontin  Take 1 capsule (300 mg total) by mouth 2 (two) times daily.   IRON  27 PO Take 1 tablet by mouth daily.   Iyuzeh  0.005 % Soln Generic drug: Latanoprost  PF Apply 1 drop to eye at bedtime.   metFORMIN  500 MG tablet Commonly known as: GLUCOPHAGE  Take 500 mg by mouth 2 (two) times daily.   midodrine  5 MG tablet Commonly known as: PROAMATINE  Take 1 tablet (5 mg total) by mouth 3 (three) times daily with meals.   NovoLOG   FlexPen 100 UNIT/ML FlexPen Generic drug: insulin  aspart Inject 0-12 Units into the skin See admin instructions. Short acting insulin  per sliding scale 0-10 ---:-- Insulin  injection 0-10 Units 0-10 Units Subcutaneous, 3 times daily with meals CBG 70 - 120: 0 unit CBG 121 - 150: 0 unit  CBG 151 - 200: 2 unit CBG 201 - 250: 4 units CBG 251 - 300: 6 units CBG 301 -  350: 8 units  CBG 351 - 400: 10 units  CBG > 400: 12 units   pantoprazole  40 MG tablet Commonly known as: PROTONIX  TAKE 1 TABLET BY MOUTH ONE TIME DAILY 30-60 MINUTES BEFORE BREAKFAST What changed: See the new instructions.   Pirfenidone  267 MG Tabs Take 2 tablets (534 mg total) by mouth in the morning, at noon, and at bedtime. **low dose as maintenance**   potassium chloride  10 MEQ CR capsule Commonly known as: MICRO-K  Take 1 capsule (10 mEq total) by mouth daily as needed (when you take lasix ). What changed:  when to take this reasons to take this   Tyvaso  DPI Maintenance Kit 64 MCG Powd Generic drug: Treprostinil  Inhale 1 puff into the lungs in the morning, at noon, in the evening, and at bedtime.               Durable Medical Equipment  (From admission, onward)           Start     Ordered   06/10/24 1213  For home use only DME oxygen  Once       Question Answer Comment  Length of Need Lifetime   Mode or (Route) Nasal cannula   Liters per Minute 8   Frequency Continuous (stationary and portable oxygen unit needed)   Oxygen conserving device Yes   Oxygen delivery system Gas      06/10/24 1213            Follow-up Information     Christian Carwin, MD Follow up in 1 week(s).   Specialty: Internal Medicine Why: BMP and BP check Contact information: [address] [place] [address] [phone]                  Time coordinating discharge: 45 minutes  Signed:  Harlene RAYMOND Bowl DO  [facility] 06/10/2024, 2:40 PM

## 2024-06-10 NOTE — Plan of Care (Signed)
 ?  Problem: Clinical Measurements: ?Goal: Will remain free from infection ?Outcome: Progressing ?  ?

## 2024-06-10 NOTE — Evaluation (Addendum)
 Physical Therapy Evaluation Patient Details Name: Christian Woodard MRN: [id number] DOB: [birth year] Today's Date: 06/10/2024  History of Present Illness  Christian Woodard is a 78 y.o. male with medical history significant of pulmonary fibrosis, pulmonary hypertension, diabetes, chronic diastolic CHF, chronic hypoxic respiratory failure who presents from home after syncopal event.  Patient went to his see his PCP today where his blood pressure was found to be low (family reports systolic in the 80s).  Family reports he had an episode of generalized weakness, sat down in a chair, and then passed out.  Daughter reports he stopped breathing for short period time but responded to a sternal rub.  EMS was called.  When EMS arrived he was placed on 8 L nasal cannula and satting in the 80s.  His blood pressure was found to be in the 70s.  He was given IV fluids and placed in Trendelenburg with improvement of blood pressure.  In the ER, his CBG was found to be in the 40s and he was given an amp of D50 with improvement.  ER doc was concern for adrenal insufficiency as he was recently on several courses of prednisone  so he was dosed with Solu-Cortef  x 1.  CT scan was obtained of his lungs that questioned fluid overload so he was given a dose of IV Lasix .  TRH was asked to admit for further workup and monitoring.   Clinical Impression  Patient agreeable to and tolerated PT evaluation well. Patient's wife present and contributes to subjective history taking. Patient was received in bed. Required CGA-Supervision throughout session for all bed mobility, functional transfers w/ AD, and ambulation with AD. Orthostatics taken during session. Supine: 111/69, Seated EOB: 110/76, standing: 95/46 w/ no symptoms, returned to supine w/ few min rest: 112/66. SpO2 monitored throughout, drops to 79 at lowest during mobility on 8 Lpm. Returns to 90% with rest on 7 Lpm. Patient and wife report patient has 24/7 support at home and has  only required supervision for ADLs and mobility, similar to today's session. Report he currently is receiving HHPT services. Patient will benefit from continued skilled physical therapy while acutely admitted to hospital and in recommended venue, HHPT, in order to address deficits, with control of blood pressure.        If plan is discharge home, recommend the following: A little help with walking and/or transfers;A little help with bathing/dressing/bathroom;Assistance with cooking/housework;Assist for transportation;Help with stairs or ramp for entrance   Can travel by private vehicle        Equipment Recommendations None recommended by PT  Recommendations for Other Services       Functional Status Assessment Patient has had a recent decline in their functional status and demonstrates the ability to make significant improvements in function in a reasonable and predictable amount of time.     Precautions / Restrictions Precautions Precautions: Fall Recall of Precautions/Restrictions: Intact Restrictions Weight Bearing Restrictions Per Provider Order: No      Mobility  Bed Mobility Overal bed mobility: Needs Assistance Bed Mobility: Supine to Sit, Sit to Supine     Supine to sit: Modified independent (Device/Increase time), Supervision, HOB elevated Sit to supine: Modified independent (Device/Increase time), Supervision, HOB elevated   General bed mobility comments: HOB Elevated ~45 deg to mimic home. Inc time needed, pt with mild SOB once seated EOB    Transfers Overall transfer level: Needs assistance Equipment used: Rolling walker (2 wheels) Transfers: Sit to/from Stand Sit to Stand: Supervision, Contact guard assist  General transfer comment: CGA for safety. Slow, labored movement, mildly SOB, reports no dizziness    Ambulation/Gait Ambulation/Gait assistance: Contact guard assist, Supervision Gait Distance (Feet): 5 Feet Assistive device: Rolling  walker (2 wheels) Gait Pattern/deviations: Step-through pattern, Decreased step length - right, Decreased step length - left, Trunk flexed Gait velocity: Dec     General Gait Details: Limited to a few forward/backward and side steps at bedside w/ RW, CGA for safety, no overt LOB or unsteadiness to note. Pt w/ mild SOB t/o.  Stairs            Wheelchair Mobility     Tilt Bed    Modified Rankin (Stroke Patients Only)       Balance Overall balance assessment: Needs assistance Sitting-balance support: No upper extremity supported, Feet supported Sitting balance-Leahy Scale: Good Sitting balance - Comments: Seated EOB   Standing balance support: During functional activity, Bilateral upper extremity supported, Single extremity supported Standing balance-Leahy Scale: Fair Standing balance comment: Good/Fair w/ RW. with single and double UE support t/o.         Pertinent Vitals/Pain Pain Assessment Pain Assessment: No/denies pain    Home Living Family/patient expects to be discharged to:: Private residence Living Arrangements: Spouse/significant other Available Help at Discharge: Family;Available 24 hours/day Type of Home: House Home Access: Ramped entrance       Home Layout: One level Home Equipment: Cane - single Librarian, academic (2 wheels);Shower seat;Toilet riser;Other (comment) (Also has a wedge for sleeping propped up, and installed bed railings) Additional Comments: Reports after last hospital admission in May, pt went to Rehab for two weeks and returned home.    Prior Function Prior Level of Function : Needs assist;History of Falls (last six months)       Mobility Comments: Reports as household ambulator with RW. On 6 Lpm at baseline. Supervision for all mobility ADLs Comments: Wife reports she provides supervision during ADLs, no physical assist     Extremity/Trunk Assessment   Upper Extremity Assessment Upper Extremity Assessment: Generalized  weakness;Overall [facility] for tasks assessed (4-/5 shoulder flexion MMT, ROM WFL but slightly less than normal)    Lower Extremity Assessment Lower Extremity Assessment: Overall WFL for tasks assessed;Generalized weakness (ankle DF MMT 4+/5 bilat, hip flexion 4-/5 bilat)    Cervical / Trunk Assessment Cervical / Trunk Assessment: Normal  Communication   Communication Communication: No apparent difficulties    Cognition Arousal: Alert Behavior During Therapy: WFL for tasks assessed/performed   PT - Cognitive impairments: No apparent impairments       Following commands: Intact       Cueing Cueing Techniques: Verbal cues     General Comments      Exercises     Assessment/Plan    PT Assessment All further PT needs can be met in the next venue of care;Patient needs continued PT services  PT Problem List Decreased strength;Decreased range of motion;Decreased activity tolerance;Decreased balance;Decreased mobility       PT Treatment Interventions DME instruction;Gait training;Therapeutic activities;Therapeutic exercise;Balance training;Functional mobility training    PT Goals (Current goals can be found in the Care Plan section)  Acute Rehab PT Goals Patient Stated Goal: Return home to continue with HHPT services PT Goal Formulation: With patient/family Time For Goal Achievement: 06/13/24 Potential to Achieve Goals: Good    Frequency Min 3X/week     Co-evaluation               AM-PAC PT 6 Clicks Mobility  Outcome Measure Help  needed turning from your back to your side while in a flat bed without using bedrails?: None Help needed moving from lying on your back to sitting on the side of a flat bed without using bedrails?: A Little Help needed moving to and from a bed to a chair (including a wheelchair)?: A Little Help needed standing up from a chair using your arms (e.g., wheelchair or bedside chair)?: A Little Help needed to walk in hospital room?: A  Little Help needed climbing 3-5 steps with a railing? : A Little 6 Click Score: 19    End of Session Equipment Utilized During Treatment: Oxygen Activity Tolerance: Patient tolerated treatment well;Patient limited by fatigue Patient left: in bed;with family/visitor present;with call bell/phone within reach Nurse Communication: Mobility status;Other (comment) (IV needed to be assessed) PT Visit Diagnosis: Other abnormalities of gait and mobility (R26.89);Repeated falls (R29.6);Muscle weakness (generalized) (M62.81);Unsteadiness on feet (R26.81);History of falling (Z91.81)    Time: 9079-9053 PT Time Calculation (min) (ACUTE ONLY): 26 min   Charges:   PT Evaluation $PT Eval Low Complexity: 1 Low PT Treatments $Therapeutic Activity: 23-37 mins PT General Charges $$ ACUTE PT VISIT: 1 Visit        10:21 AM, 06/10/24 Rosaria Settler, PT, DPT [facility] [place]

## 2024-06-10 NOTE — Telephone Encounter (Signed)
 Patient Product/process development scientist completed.    The patient is insured through [employer]. Patient has Medicare and is not eligible for a copay card, but may be able to apply for patient assistance or Medicare RX Payment Plan (Patient Must reach out to their plan, if eligible for payment plan), if available.    Ran test claim for Tradjenta 5 mg and the current 30 day co-pay is $0.00.   This test claim was processed through [facility]- copay amounts may vary at other pharmacies due to pharmacy/plan contracts, or as the patient moves through the different stages of their insurance plan.     Roland Earl, CPHT Pharmacy Technician III Certified Patient Advocate [facility] Patient Advocate Team Direct Number: [phone]  Fax: [phone]

## 2024-06-10 NOTE — Progress Notes (Signed)
 Mobility Specialist Progress Note:    06/10/24 1342  Mobility  Activity Ambulated with assistance in hallway  Level of Assistance Contact guard assist, steadying assist  Assistive Device None  Distance Ambulated (ft) 30 ft  Range of Motion/Exercises Active;All extremities  Activity Response Tolerated well  Mobility Referral Yes  Mobility visit 1 Mobility  Mobility Specialist Start Time (ACUTE ONLY) 1322  Mobility Specialist Stop Time (ACUTE ONLY) 1338  Mobility Specialist Time Calculation (min) (ACUTE ONLY) 16 min   Pt received in bed, agreeable to mobility. Required CGA to stand and ambulate with no AD. Tolerated well, SpO2 90% on 6L at rest. SpO2 86% on 6L during ambulation. SpO2 90% on 10L during ambulation. Returned to room, nurse at bedside. All needs met.   Sherrilee Ditty Mobility Specialist Please contact via Special educational needs teacher or  Rehab office at [phone]

## 2024-06-10 NOTE — Progress Notes (Signed)
 Discharge paper work and home meds returned. Pt is waiting  for grandson to pick him up at this time.

## 2024-06-10 NOTE — Progress Notes (Addendum)
 SATURATION QUALIFICATIONS: (This note is used to comply with regulatory documentation for home oxygen)  Patient Saturations on Room Air on 6L = 90%  Patient Saturations o6L while Ambulating = 83%  Pt ambulated with 8L was 86%  Patient Saturations on 10 Liters of oxygen while Ambulating = 90%  Please briefly explain why patient needs home oxygen:

## 2024-06-10 NOTE — Plan of Care (Signed)
  Problem: Acute Rehab PT Goals(only PT should resolve) Goal: Pt Will Go Supine/Side To Sit Outcome: Progressing Flowsheets (Taken 06/10/2024 1023) Pt will go Supine/Side to Sit: with modified independence Goal: Patient Will Transfer Sit To/From Stand Outcome: Progressing Flowsheets (Taken 06/10/2024 1023) Patient will transfer sit to/from stand: with modified independence Goal: Pt Will Transfer Bed To Chair/Chair To Bed Outcome: Progressing Flowsheets (Taken 06/10/2024 1023) Pt will Transfer Bed to Chair/Chair to Bed: with modified independence Goal: Pt Will Ambulate Outcome: Progressing Flowsheets (Taken 06/10/2024 1023) Pt will Ambulate:  15 feet  with rolling walker  with supervision   10:23 AM, 06/10/24 Rosaria Settler, PT, DPT [facility] [place]

## 2024-06-13 DIAGNOSIS — N1831 Chronic kidney disease, stage 3a: Secondary | ICD-10-CM | POA: Diagnosis not present

## 2024-06-13 DIAGNOSIS — G40909 Epilepsy, unspecified, not intractable, without status epilepticus: Secondary | ICD-10-CM | POA: Diagnosis not present

## 2024-06-13 DIAGNOSIS — I272 Pulmonary hypertension, unspecified: Secondary | ICD-10-CM | POA: Diagnosis not present

## 2024-06-13 DIAGNOSIS — L03116 Cellulitis of left lower limb: Secondary | ICD-10-CM | POA: Diagnosis not present

## 2024-06-13 DIAGNOSIS — G35 Multiple sclerosis: Secondary | ICD-10-CM | POA: Diagnosis not present

## 2024-06-13 DIAGNOSIS — J9621 Acute and chronic respiratory failure with hypoxia: Secondary | ICD-10-CM | POA: Diagnosis not present

## 2024-06-13 DIAGNOSIS — E1122 Type 2 diabetes mellitus with diabetic chronic kidney disease: Secondary | ICD-10-CM | POA: Diagnosis not present

## 2024-06-13 DIAGNOSIS — I5033 Acute on chronic diastolic (congestive) heart failure: Secondary | ICD-10-CM | POA: Diagnosis not present

## 2024-06-13 DIAGNOSIS — J8410 Pulmonary fibrosis, unspecified: Secondary | ICD-10-CM | POA: Diagnosis not present

## 2024-06-14 LAB — CULTURE, BLOOD (ROUTINE X 2)
Culture: NO GROWTH
Culture: NO GROWTH
Special Requests: ADEQUATE
Special Requests: ADEQUATE

## 2024-06-16 DIAGNOSIS — N1832 Chronic kidney disease, stage 3b: Secondary | ICD-10-CM | POA: Diagnosis not present

## 2024-06-16 DIAGNOSIS — J9611 Chronic respiratory failure with hypoxia: Secondary | ICD-10-CM | POA: Diagnosis not present

## 2024-06-16 DIAGNOSIS — I5032 Chronic diastolic (congestive) heart failure: Secondary | ICD-10-CM | POA: Diagnosis not present

## 2024-06-16 DIAGNOSIS — E1122 Type 2 diabetes mellitus with diabetic chronic kidney disease: Secondary | ICD-10-CM | POA: Diagnosis not present

## 2024-06-17 DIAGNOSIS — I272 Pulmonary hypertension, unspecified: Secondary | ICD-10-CM | POA: Diagnosis not present

## 2024-06-17 DIAGNOSIS — N1831 Chronic kidney disease, stage 3a: Secondary | ICD-10-CM | POA: Diagnosis not present

## 2024-06-17 DIAGNOSIS — E1122 Type 2 diabetes mellitus with diabetic chronic kidney disease: Secondary | ICD-10-CM | POA: Diagnosis not present

## 2024-06-17 DIAGNOSIS — G40909 Epilepsy, unspecified, not intractable, without status epilepticus: Secondary | ICD-10-CM | POA: Diagnosis not present

## 2024-06-17 DIAGNOSIS — G35 Multiple sclerosis: Secondary | ICD-10-CM | POA: Diagnosis not present

## 2024-06-17 DIAGNOSIS — L03116 Cellulitis of left lower limb: Secondary | ICD-10-CM | POA: Diagnosis not present

## 2024-06-17 DIAGNOSIS — J9621 Acute and chronic respiratory failure with hypoxia: Secondary | ICD-10-CM | POA: Diagnosis not present

## 2024-06-17 DIAGNOSIS — J8410 Pulmonary fibrosis, unspecified: Secondary | ICD-10-CM | POA: Diagnosis not present

## 2024-06-17 DIAGNOSIS — I5033 Acute on chronic diastolic (congestive) heart failure: Secondary | ICD-10-CM | POA: Diagnosis not present

## 2024-06-19 ENCOUNTER — Telehealth: Payer: Self-pay

## 2024-06-19 DIAGNOSIS — J8410 Pulmonary fibrosis, unspecified: Secondary | ICD-10-CM | POA: Diagnosis not present

## 2024-06-19 DIAGNOSIS — N1831 Chronic kidney disease, stage 3a: Secondary | ICD-10-CM | POA: Diagnosis not present

## 2024-06-19 DIAGNOSIS — J9621 Acute and chronic respiratory failure with hypoxia: Secondary | ICD-10-CM | POA: Diagnosis not present

## 2024-06-19 DIAGNOSIS — E1122 Type 2 diabetes mellitus with diabetic chronic kidney disease: Secondary | ICD-10-CM | POA: Diagnosis not present

## 2024-06-19 DIAGNOSIS — G35 Multiple sclerosis: Secondary | ICD-10-CM | POA: Diagnosis not present

## 2024-06-19 DIAGNOSIS — I272 Pulmonary hypertension, unspecified: Secondary | ICD-10-CM | POA: Diagnosis not present

## 2024-06-19 DIAGNOSIS — L03116 Cellulitis of left lower limb: Secondary | ICD-10-CM | POA: Diagnosis not present

## 2024-06-19 DIAGNOSIS — I5033 Acute on chronic diastolic (congestive) heart failure: Secondary | ICD-10-CM | POA: Diagnosis not present

## 2024-06-19 DIAGNOSIS — G40909 Epilepsy, unspecified, not intractable, without status epilepticus: Secondary | ICD-10-CM | POA: Diagnosis not present

## 2024-06-19 NOTE — Telephone Encounter (Signed)
 Overdue on atorvastatin  refill, patient's wife will call this in. I will check back in next week to confirm fill

## 2024-06-23 DIAGNOSIS — L03116 Cellulitis of left lower limb: Secondary | ICD-10-CM | POA: Diagnosis not present

## 2024-06-23 DIAGNOSIS — G40909 Epilepsy, unspecified, not intractable, without status epilepticus: Secondary | ICD-10-CM | POA: Diagnosis not present

## 2024-06-23 DIAGNOSIS — E1122 Type 2 diabetes mellitus with diabetic chronic kidney disease: Secondary | ICD-10-CM | POA: Diagnosis not present

## 2024-06-23 DIAGNOSIS — J9621 Acute and chronic respiratory failure with hypoxia: Secondary | ICD-10-CM | POA: Diagnosis not present

## 2024-06-23 DIAGNOSIS — I272 Pulmonary hypertension, unspecified: Secondary | ICD-10-CM | POA: Diagnosis not present

## 2024-06-23 DIAGNOSIS — E662 Morbid (severe) obesity with alveolar hypoventilation: Secondary | ICD-10-CM | POA: Diagnosis not present

## 2024-06-23 DIAGNOSIS — G35 Multiple sclerosis: Secondary | ICD-10-CM | POA: Diagnosis not present

## 2024-06-23 DIAGNOSIS — J8410 Pulmonary fibrosis, unspecified: Secondary | ICD-10-CM | POA: Diagnosis not present

## 2024-06-23 DIAGNOSIS — I5032 Chronic diastolic (congestive) heart failure: Secondary | ICD-10-CM | POA: Diagnosis not present

## 2024-06-23 DIAGNOSIS — N1831 Chronic kidney disease, stage 3a: Secondary | ICD-10-CM | POA: Diagnosis not present

## 2024-06-23 DIAGNOSIS — I5033 Acute on chronic diastolic (congestive) heart failure: Secondary | ICD-10-CM | POA: Diagnosis not present

## 2024-06-24 ENCOUNTER — Telehealth: Payer: Self-pay

## 2024-06-24 ENCOUNTER — Other Ambulatory Visit (HOSPITAL_COMMUNITY): Payer: Self-pay

## 2024-06-24 NOTE — Telephone Encounter (Signed)
 Confirmed fill of atorvastatin , up to date on meds. Next review in October.

## 2024-06-25 DIAGNOSIS — J8410 Pulmonary fibrosis, unspecified: Secondary | ICD-10-CM | POA: Diagnosis not present

## 2024-06-25 DIAGNOSIS — I272 Pulmonary hypertension, unspecified: Secondary | ICD-10-CM | POA: Diagnosis not present

## 2024-06-25 DIAGNOSIS — E1122 Type 2 diabetes mellitus with diabetic chronic kidney disease: Secondary | ICD-10-CM | POA: Diagnosis not present

## 2024-06-25 DIAGNOSIS — L03116 Cellulitis of left lower limb: Secondary | ICD-10-CM | POA: Diagnosis not present

## 2024-06-25 DIAGNOSIS — G40909 Epilepsy, unspecified, not intractable, without status epilepticus: Secondary | ICD-10-CM | POA: Diagnosis not present

## 2024-06-25 DIAGNOSIS — J9621 Acute and chronic respiratory failure with hypoxia: Secondary | ICD-10-CM | POA: Diagnosis not present

## 2024-06-25 DIAGNOSIS — G35 Multiple sclerosis: Secondary | ICD-10-CM | POA: Diagnosis not present

## 2024-06-25 DIAGNOSIS — N1831 Chronic kidney disease, stage 3a: Secondary | ICD-10-CM | POA: Diagnosis not present

## 2024-06-25 DIAGNOSIS — I5033 Acute on chronic diastolic (congestive) heart failure: Secondary | ICD-10-CM | POA: Diagnosis not present

## 2024-06-26 ENCOUNTER — Other Ambulatory Visit: Payer: Self-pay

## 2024-06-26 NOTE — Progress Notes (Signed)
 Specialty Pharmacy Refill Coordination Note  Christian Woodard is a 78 y.o. male contacted today regarding refills of specialty medication(s) Pirfenidone   Spoke with patient's wife  Patient requested Delivery   Delivery date: 07/02/24   Verified address: [address] [place]   Medication will be filled on 08.05.25.

## 2024-06-26 NOTE — Progress Notes (Signed)
 Clinical Intervention Note  Clinical Intervention Notes: Patient reported starting midodrine , no DDIs were identified with his pirfenidone .   Clinical Intervention Outcomes: Prevention of an adverse drug event   Christian Woodard Christian Woodard Christian Woodard Christian Woodard Christian Woodard

## 2024-06-30 DIAGNOSIS — G40909 Epilepsy, unspecified, not intractable, without status epilepticus: Secondary | ICD-10-CM | POA: Diagnosis not present

## 2024-06-30 DIAGNOSIS — J9621 Acute and chronic respiratory failure with hypoxia: Secondary | ICD-10-CM | POA: Diagnosis not present

## 2024-06-30 DIAGNOSIS — J8410 Pulmonary fibrosis, unspecified: Secondary | ICD-10-CM | POA: Diagnosis not present

## 2024-06-30 DIAGNOSIS — N1831 Chronic kidney disease, stage 3a: Secondary | ICD-10-CM | POA: Diagnosis not present

## 2024-06-30 DIAGNOSIS — E1122 Type 2 diabetes mellitus with diabetic chronic kidney disease: Secondary | ICD-10-CM | POA: Diagnosis not present

## 2024-06-30 DIAGNOSIS — L03116 Cellulitis of left lower limb: Secondary | ICD-10-CM | POA: Diagnosis not present

## 2024-06-30 DIAGNOSIS — I5033 Acute on chronic diastolic (congestive) heart failure: Secondary | ICD-10-CM | POA: Diagnosis not present

## 2024-06-30 DIAGNOSIS — G35 Multiple sclerosis: Secondary | ICD-10-CM | POA: Diagnosis not present

## 2024-06-30 DIAGNOSIS — I272 Pulmonary hypertension, unspecified: Secondary | ICD-10-CM | POA: Diagnosis not present

## 2024-07-02 DIAGNOSIS — J9621 Acute and chronic respiratory failure with hypoxia: Secondary | ICD-10-CM | POA: Diagnosis not present

## 2024-07-02 DIAGNOSIS — I272 Pulmonary hypertension, unspecified: Secondary | ICD-10-CM | POA: Diagnosis not present

## 2024-07-02 DIAGNOSIS — J8410 Pulmonary fibrosis, unspecified: Secondary | ICD-10-CM | POA: Diagnosis not present

## 2024-07-02 DIAGNOSIS — G40909 Epilepsy, unspecified, not intractable, without status epilepticus: Secondary | ICD-10-CM | POA: Diagnosis not present

## 2024-07-07 ENCOUNTER — Other Ambulatory Visit: Payer: Self-pay | Admitting: Internal Medicine

## 2024-07-07 DIAGNOSIS — I2723 Pulmonary hypertension due to lung diseases and hypoxia: Secondary | ICD-10-CM

## 2024-07-08 DIAGNOSIS — I5032 Chronic diastolic (congestive) heart failure: Secondary | ICD-10-CM | POA: Diagnosis not present

## 2024-07-08 DIAGNOSIS — K529 Noninfective gastroenteritis and colitis, unspecified: Secondary | ICD-10-CM | POA: Diagnosis not present

## 2024-07-08 DIAGNOSIS — J9611 Chronic respiratory failure with hypoxia: Secondary | ICD-10-CM | POA: Diagnosis not present

## 2024-07-08 DIAGNOSIS — N390 Urinary tract infection, site not specified: Secondary | ICD-10-CM | POA: Diagnosis not present

## 2024-07-08 NOTE — Telephone Encounter (Signed)
 Refill sent for TYVASO  DPI to [facility]: [phone]  Dose: 64mcg 1 puff 4 times daily   Last OV: 05/06/24 Provider: Dr. Geronimo  Next OV: 07/22/24  Aleck Puls, PharmD, BCPS Clinical Pharmacist  [facility]

## 2024-07-09 DIAGNOSIS — N390 Urinary tract infection, site not specified: Secondary | ICD-10-CM | POA: Diagnosis not present

## 2024-07-11 ENCOUNTER — Other Ambulatory Visit: Payer: Self-pay | Admitting: Gastroenterology

## 2024-07-14 ENCOUNTER — Telehealth: Payer: Self-pay | Admitting: Internal Medicine

## 2024-07-14 NOTE — Telephone Encounter (Signed)
 PT was sched for a Dr. Geronimo appt by Nurse but needed a Spiro too. If I resched it will be until Nov or Dec. Did Dr. JONELLE want to see him on 8/26 with out Spiro? Please advise.

## 2024-07-17 NOTE — Telephone Encounter (Signed)
 Called and spoke with patient wife per dpr,states patient in unable to do test as he is just getting over being sick and transportation issues,patient has been weaker and shakier than normal and does not think he can do it,also having issues with oxygen has he has been on higher liter flow than normal causing tanks to run out quicker when he leaves house for appts.Please advise if still willing to see patient on 8/26 without spiro

## 2024-07-21 DIAGNOSIS — J84112 Idiopathic pulmonary fibrosis: Secondary | ICD-10-CM | POA: Diagnosis not present

## 2024-07-21 DIAGNOSIS — J9611 Chronic respiratory failure with hypoxia: Secondary | ICD-10-CM | POA: Diagnosis not present

## 2024-07-21 DIAGNOSIS — Z79899 Other long term (current) drug therapy: Secondary | ICD-10-CM | POA: Diagnosis not present

## 2024-07-22 ENCOUNTER — Ambulatory Visit: Admitting: Internal Medicine

## 2024-07-22 ENCOUNTER — Encounter: Payer: Self-pay | Admitting: Internal Medicine

## 2024-07-22 ENCOUNTER — Telehealth: Payer: Self-pay | Admitting: Internal Medicine

## 2024-07-22 VITALS — BP 114/68 | HR 89 | Ht 66.0 in | Wt 158.0 lb

## 2024-07-22 DIAGNOSIS — J8410 Pulmonary fibrosis, unspecified: Secondary | ICD-10-CM

## 2024-07-22 DIAGNOSIS — J84112 Idiopathic pulmonary fibrosis: Secondary | ICD-10-CM

## 2024-07-22 DIAGNOSIS — I2723 Pulmonary hypertension due to lung diseases and hypoxia: Secondary | ICD-10-CM

## 2024-07-22 DIAGNOSIS — Z7189 Other specified counseling: Secondary | ICD-10-CM

## 2024-07-22 DIAGNOSIS — J9611 Chronic respiratory failure with hypoxia: Secondary | ICD-10-CM | POA: Diagnosis not present

## 2024-07-22 LAB — HEPATIC FUNCTION PANEL
ALT: 12 U/L (ref 0–53)
AST: 23 U/L (ref 0–37)
Albumin: 4.3 g/dL (ref 3.5–5.2)
Alkaline Phosphatase: 66 U/L (ref 39–117)
Bilirubin, Direct: 0.2 mg/dL (ref 0.0–0.3)
Total Bilirubin: 0.9 mg/dL (ref 0.2–1.2)
Total Protein: 7.6 g/dL (ref 6.0–8.3)

## 2024-07-22 LAB — BASIC METABOLIC PANEL WITH GFR
BUN: 23 mg/dL (ref 6–23)
CO2: 29 meq/L (ref 19–32)
Calcium: 9.7 mg/dL (ref 8.4–10.5)
Chloride: 100 meq/L (ref 96–112)
Creatinine, Ser: 1.29 mg/dL (ref 0.40–1.50)
GFR: 52.98 mL/min — ABNORMAL LOW (ref >60.00)
Glucose, Bld: 128 mg/dL — ABNORMAL HIGH (ref 70–99)
Potassium: 4.5 meq/L (ref 3.5–5.1)
Sodium: 140 meq/L (ref 135–145)

## 2024-07-22 LAB — CBC WITH DIFFERENTIAL/PLATELET
Basophils Absolute: 0.1 10*3/uL (ref 0.0–0.1)
Basophils Relative: 1.3 % (ref 0.0–3.0)
Eosinophils Absolute: 0.1 10*3/uL (ref 0.0–0.7)
Eosinophils Relative: 0.6 % (ref 0.0–5.0)
HCT: 44.6 % (ref 39.0–52.0)
Hemoglobin: 14.5 g/dL (ref 13.0–17.0)
Lymphocytes Relative: 6.6 % — ABNORMAL LOW (ref 12.0–46.0)
Lymphs Abs: 0.7 10*3/uL (ref 0.7–4.0)
MCHC: 32.6 g/dL (ref 30.0–36.0)
MCV: 88 fl (ref 78.0–100.0)
Monocytes Absolute: 0.9 10*3/uL (ref 0.1–1.0)
Monocytes Relative: 8.1 % (ref 3.0–12.0)
Neutro Abs: 9 10*3/uL — ABNORMAL HIGH (ref 1.4–7.7)
Neutrophils Relative %: 83.4 % — ABNORMAL HIGH (ref 43.0–77.0)
Platelets: 258 10*3/uL (ref 150.0–400.0)
RBC: 5.06 Mil/uL (ref 4.22–5.81)
RDW: 17.5 % — ABNORMAL HIGH (ref 11.5–15.5)
WBC: 10.8 10*3/uL — ABNORMAL HIGH (ref 4.0–10.5)

## 2024-07-22 LAB — BRAIN NATRIURETIC PEPTIDE: Pro B Natriuretic peptide (BNP): 675 pg/mL — ABNORMAL HIGH (ref 0.0–100.0)

## 2024-07-22 NOTE — Telephone Encounter (Signed)
 I stil plan on seeing him TODAY 07/22/2024

## 2024-07-22 NOTE — Telephone Encounter (Signed)
  His BNP is slightly high and ideally would like to diurese him but at the same time I do want to touch base with his new cardiologist.  Has he seen a new cardiologist?  Because I do not want to drop his blood pressure with too much Lasix   Please find out and let me know     LABS   BNP SLIGHT HIGH PULMONARY No results for input(s): PHART, PCO2ART, PO2ART, HCO3, TCO2, O2SAT in the last 168 hours.  Invalid input(s): PCO2, PO2  CBC Recent Labs  Lab 07/22/24 1144  HGB 14.5  HCT 44.6  WBC 10.8*  PLT 258.0    COAGULATION No results for input(s): INR in the last 168 hours.  CARDIAC  No results for input(s): TROPONINI in the last 168 hours. Recent Labs  Lab 07/22/24 1144  PROBNP 675.0*     CHEMISTRY Recent Labs  Lab 07/22/24 1144  NA 140  K 4.5  CL 100  CO2 29  GLUCOSE 128*  BUN 23  CREATININE 1.29  CALCIUM  9.7   Estimated Creatinine Clearance: 42.6 mL/min (by C-G formula based on SCr of 1.29 mg/dL).   LIVER Recent Labs  Lab 07/22/24 1144  AST 23  ALT 12  ALKPHOS 66  BILITOT 0.9  PROT 7.6  ALBUMIN 4.3     INFECTIOUS No results for input(s): LATICACIDVEN, PROCALCITON in the last 168 hours.   ENDOCRINE CBG (last 3)  No results for input(s): GLUCAP in the last 72 hours.       IMAGING x48h  - image(s) personally visualized  -   highlighted in bold No results found.

## 2024-07-22 NOTE — Patient Instructions (Addendum)
 ICD-10-CM   1. Pulmonary fibrosis (HCC)  J84.10 B Nat Peptide    Basic Metabolic Panel (BMET)    Hepatic function panel    CBC w/Diff    CBC w/Diff    B Nat Peptide    Basic Metabolic Panel (BMET)    Hepatic function panel    2. Chronic respiratory failure with hypoxia (HCC)  J96.11 B Nat Peptide    Basic Metabolic Panel (BMET)    Hepatic function panel    CBC w/Diff    CBC w/Diff    B Nat Peptide    Basic Metabolic Panel (BMET)    Hepatic function panel    3. WHO group 3 pulmonary arterial hypertension (HCC)  I27.23 B Nat Peptide    Basic Metabolic Panel (BMET)    Hepatic function panel    CBC w/Diff    CBC w/Diff    B Nat Peptide    Basic Metabolic Panel (BMET)    Hepatic function panel        Chronic hypoxemic respiratory failure  -This is because of a combination of pulmonary fibrosis IPF and also pulmonary hypertension diagnosed January 2025 - Clinically more stable after decompensation because of cor pulmonale in mid May 2025 and July 2025.   - But overall the disease appears to be progressive and now on 8L at result   Plan - Continue oxygen to keep pulse ox goal greater than 88%  -- Use  8 L of oxygen - Glad you are going through home physical therapy - Check blood BNP today  Syncope  - likely due to [facility] and Cozzar and tyvaso  all mixing up - resolved afte July 2025 admit and stopping cozaar  and starting midodrie  Plan  - keep pulse ox > 88%  - continue mididrine  - avoid Anti-BP drugs  ILD/IPF -IPF diagnosis given December 2024 High risk prescription Encounter for therapeutic monitoring  -Glad you are tolerating low-dose pirfenidone  protocol well since January 2025 - Liver function test May 2025 is normal   PLAN -Continue Esbriet  2 pills 3 times daily with food and please to apply sunscreen when you go out -Check liver function test 07/22/2024 - hold off on CT an dPFT monitoring due to inconvenience  - appreciate interetst in clinical  trials   WHO group 3 pulmonary hypertension =-diagnosed January 2025  -Tolerating Tyvaso  DPI at full dose   Plan  - Continue inhaled Tyvaso  DPI per protocol at full dose = chjec BNP 07/22/2024 - consider PHOCUS study in late 2025 /early 2026  GOALS of care  Plan  - recommend DNR, DNI - recommend fillling out MOST FORM at home in [place]   Followup -8-12 weeks 30-minute visit with Dr. Geronimo

## 2024-07-22 NOTE — Progress Notes (Signed)
 024     OV 09/03/2023 transfer of care from Dr. Ozell America to Dr. Geronimo in the ILD center.  Subjective:  Patient ID: Christian Woodard, male , DOB: [birth year] , age 78 y.o. , MRN: [id number] , [address]: [address] [place] [id number] PCP Sheryle Carwin, MD Patient Care Team: Sheryle Carwin, MD as PCP - General (Internal Medicine) Shaaron Lamar HERO, MD as Consulting Physician (Gastroenterology) America Ozell NOVAK, MD as Consulting Physician (Pulmonary Disease)  This Provider for this visit: Treatment Team:  Attending Provider: Geronimo Amel, MD    09/03/2023 -   Chief Complaint  Patient presents with   Pulmonary Consult     Referred by Dr. America for IPF.      HPI Christian Woodard 78 y.o. -presents on referral from Dr. er.  History is taken from the patient, his wife's granddaughter who stated bedside and also reviewed the records.  He tells me that in 2020 he had COVID-19 he was hospitalized at [facility] here in [place].  This was then the COVID hospital.  He was treated with oxygen and then he got better.  And then he was almost back to baseline according to the wife.  Occasionally gets short of breath.  Then starting in April May 2024 he started noticing shortness of breath going to the mailbox and coming back.  This then resulted in hospitalization for 1 day at [facility]..  Much of this history was confirmed there.  He had new onset hypoxemia and discharged on 3-4 L oxygen which she continues to use but there is a problem checking his pulse ox because the finger tracings are very poor at home.  He has continued this amount of oxygen.  The finger pulse ox would not pick up at all.  I turned his oxygen off and his pulse ox today was 88-91% on room air with a forehead probe [corresponding finger probe was 55%].  Review of the records indicate that he did have history of chronic Barrett's and's esophagus.  In the hospital iron  deficiency anemia hemoglobin 12.5 g  was noticed.  He was started on supplemental iron .  He underwent endoscopy later in July 2024 at [facility].  Barrett's esophagus with low-grade dysplasia was confirmed.  In the hospital he was discharged with a diagnosis of acute/subacute hypoxemic respiratory failure secondary to pulmonary fibrosis.  Also diastolic heart failure.  An echo in the hospital showed EF 65 to 70%.  It appears after that he was diuresed.  A follow-up BNP in August 2024 is now.    He did undergo follow-up high-resolution CT chest July 23, 2023: It shows definite presence of ILD in the bases and a craniocaudal gradient.  It is described as probable UIP but I wonder if this is all indeterminate.  He had a previous CT chest in June 2023 that did have some mild ILD findings and before that in February 2022 which was mostly ILA/nearly normal in my view.  He has had abnormal PFTs with a low DLCO is 2018 last PFT 2022 and between 2018 and 2022 DLCO reduced and overall stable.  His current CT does show coronary artery calcification.  Twelve-lead EKG today because he was tachycardic and irregular showed PVC but sinus rhythm otherwise.  It appears no change from baseline.  I personally visualized this.  CT Chest data from date: 07/23/23  - personally visualized and independently interpreted : YES - my findings are: as below Narrative & Impression  CLINICAL DATA:  78 year old male with history of shortness of breath for the past 6 months, worsening over the past 3 months.   EXAM: CT CHEST WITHOUT CONTRAST   TECHNIQUE: Multidetector CT imaging of the chest was performed following the standard protocol without intravenous contrast. High resolution imaging of the lungs, as well as inspiratory and expiratory imaging, was performed.   RADIATION DOSE REDUCTION: This exam was performed according to the departmental dose-optimization program which includes automated exposure control, adjustment of the mA and/or kV according  to patient size and/or use of iterative reconstruction technique.   COMPARISON:  Chest CT 05/25/2022. High-resolution chest CT 01/21/2021.   FINDINGS: Cardiovascular: Heart size is normal. There is no significant pericardial fluid, thickening or pericardial calcification. There is aortic atherosclerosis, as well as atherosclerosis of the great vessels of the mediastinum and the coronary arteries, including calcified atherosclerotic plaque in the left main, left anterior descending, left circumflex and right coronary arteries.   Mediastinum/Nodes: No pathologically enlarged mediastinal or hilar lymph nodes. Please note that accurate exclusion of hilar adenopathy is limited on noncontrast CT scans. Esophagus is unremarkable in appearance. No axillary lymphadenopathy.   Lungs/Pleura: High-resolution images demonstrate widespread areas of mild ground-glass attenuation, septal thickening, scattered subpleural reticulation, mild cylindrical bronchiectasis and some peripheral bronchiolectasis. These findings have a mild craniocaudal gradient and are mildly progressive compared to prior examinations. No frank honeycombing confidently identified. Inspiratory and expiratory imaging demonstrates some very mild air trapping, indicative of mild small airways disease. No acute consolidative airspace disease. No pleural effusions.   Upper Abdomen: Aortic atherosclerosis.  Status post cholecystectomy.   Musculoskeletal: There are no aggressive appearing lytic or blastic lesions noted in the visualized portions of the skeleton.   IMPRESSION: 1. Mild progression of interstitial lung disease with a spectrum of findings considered probable usual interstitial pneumonia (UIP) on today's examination. 2. Aortic atherosclerosis, in addition to left main and three-vessel coronary artery disease. Please note that although the presence of coronary artery calcium  documents the presence of coronary  artery disease, the severity of this disease and any potential stenosis cannot be assessed on this non-gated CT examination. Assessment for potential risk factor modification, dietary therapy or pharmacologic therapy may be warranted, if clinically indicated.   Aortic Atherosclerosis (ICD10-I70.0).     Electronically Signed   By: Toribio Aye M.D.   On: 07/30/2023 10:59      Result History   OV 11/07/2023  Subjective:  Patient ID: Christian Woodard, male , DOB: 05/13/1946 , age 11 y.o. , MRN: [id number] , [address]: [address]  [address] [place] [id number] PCP Sheryle Carwin, MD Patient Care Team: Sheryle Carwin, MD as PCP - General (Internal Medicine) Stacia Diannah SQUIBB, MD as PCP - Cardiology (Cardiology) Shaaron Lamar HERO, MD as Consulting Physician (Gastroenterology) Darlean Ozell NOVAK, MD as Consulting Physician (Pulmonary Disease)  This Provider for this visit: Treatment Team:  Attending Provider: Geronimo Amel, MD    11/07/2023 -   Chief Complaint  Patient presents with   Follow-up    Pft f/u, using breztri  family denies concerns. Using 4-5 L      HPI Christian Woodard 78 y.o. -ILD and out of proportion respiratory failure hypoxemic workup in progress  Presents for follow-up with his wife and daughter/granddaughterr, they again reiterated that oxygen requirements only since May/June 2024 particularly June 2024.  He is requiring 4 to 6 L at all times.  Last time he had problems figure out his true pulse ox because of the  finger probe.  This time we used a forehead probe and sure enough room air pulse ox quickly desaturated to 85%.  Even on 3 to 4 L he was 89% - 90% at rest.  He tells me no new medical issues since last visit but his sinuses have been congested and he feels like completely blocked.  In terms of his ILD and out of proportion hypoxemic respiratory failure he had autoimmune panel and serology and this is normal.  I given the ILD questionnaire to do but he has  not done that.  He did have pulmonary function test his FVC is rock steady stable since 2018 through December 2024 but his DLCO has declined but only modestly from 55% to 45% over 6 years.  I did explain to him just isolated reduction in DLCO 45% typically in my clinical experience does not correlate with requiring 4 to 5 L of oxygen but indeed he does have hypoxemia.  I again visualized the CT scan of the chest with him and his family.  I agree there is slight progression in ILD over time but again the overall burden appears very mild CAD scale to moderate.  Review of the records indicate that his last echo in summer 2024 did not show any evidence of cor pulmonale.  Noticed that he has never had a bubble study.  Noticed he is not had a right heart catheterization.  I have not reached out to the heart failure service to consider getting a right heart catheterization.  Also ordered bubble study.  In terms of his fibrosis discussed antifibrotic's with nintedanib and pirfenidone  [see below] based on this event with pirfenidone .  Will start low-dose protocol to make sure he is not having any side effects and then slowly escalate to full dose.   Esbriet /Pirfenidone  requires intensive drug monitoring due to high concerns for Adverse effects of , including  Drug Induced Liver Injury, significant GI side effects that include but not limited to Diarrhea, Nausea, Vomiting,  and other system side effects that include Fatigue, headaches, weight loss and other side effects such as skin rash. These will be monitored with  blood work such as LFT initially once a month for 6 months and then quarterly  Nintedanib/Ofev requires intensive drug monitoring due to high concerns for Adverse effects of , including  Drug Induced Liver Injury, significant GI side effects that include but not limited to Diarrhea, Nausea, Vomiting,  and other system side effects that include Fatigue,  weight loss. Cardiac side effects are a black box  warning as well. These will be monitored with  blood work such as LFT initially once a month for 6 months and then quarterly   Coronary calcification: I did have him see cardiology in [place].  He saw Dr.Mallipeddi.  He had the study on 10/30/2023.  Normal study.     OV 01/15/2024  Subjective:  Patient ID: Christian Woodard, male , DOB: 02-24-1946 , age 67 y.o. , MRN: [id number] , [address]  [address] [place] [id number] PCP Sheryle Carwin, MD Patient Care Team: Sheryle Carwin, MD as PCP - General (Internal Medicine) Stacia Diannah SQUIBB, MD as PCP - Cardiology (Cardiology) Shaaron Lamar HERO, MD as Consulting Physician (Gastroenterology) Darlean Ozell NOVAK, MD as Consulting Physician (Pulmonary Disease)  This Provider for this visit: Treatment Team:  Attending Provider: Geronimo Amel, MD     01/15/2024 -   Chief Complaint  Patient presents with   Follow-up    Pt states has a  few questions about meds.,      HPI Christian Woodard 78 y.o. -presents for follow-up with his wife and with his daughter.  Daughter is the main historian.  He attest that he is stable.  He is using 3 L oxygen at rest.  When I turned off room air he was 75% with a finger probe but with the following problems 92% room air at rest.  He is now taking pirfenidone .  He is on a low-dose protocol which is to 3 times daily.  He just got to it a few weeks ago.  He is tolerating it fine no side effects.  From his respiratory standpoint is not deteriorated.  He did have right heart catheterization.  He does have pulmonary hypertension.  Results are above.  He has been started on Tyvaso  for group 3 pulmonary hypertension but this actual start is pending orientation.  He has a drug at home.  Is going to be done in the next few days.  If this fails they know they can enter into a study for group 3 pulmonary hypertension sponsored by [employer] note they are wondering if they should continue seeing cardiology.  He  had a normal cardiac stress test.  He has completed his right heart cath.  However noticed that because of his tachycardia he did have a rhythm check and he is possible atrial tachycardia Cardizem was recommended in January 2025 by Dr. Mallipedi but he is reluctant to do it especially in the setting of lung disease.  Therefore he wants a second opinion.  I recommended Dr. Sheppard Sierras and have reached out to him.  External records reviewed.   OV 05/06/2024  Subjective:  Patient ID: Christian Woodard, male , DOB: 1946/02/15 , age 39 y.o. , MRN: [id number] , [address]  [address] [place] [id number] PCP Sheryle Carwin, MD Patient Care Team: Sheryle Carwin, MD as PCP - General (Internal Medicine) Stacia Diannah SQUIBB, MD as PCP - Cardiology (Cardiology) Shaaron Lamar HERO, MD as Consulting Physician (Gastroenterology) Darlean Ozell NOVAK, MD as Consulting Physician (Pulmonary Disease)  This Provider for this visit: Treatment Team:  Attending Provider: Geronimo Amel, MD    05/06/2024 -   Chief Complaint  Patient presents with   Follow-up    Pt had pft done this  follow up IPF and ILD , discuss change medication  family is concerns that he isn't getting right amount medication and discuss change it, still waiting on appt with cardiology      HPI Christian Woodard 78 y.o. -returns for follow-up.  Presents with wife and also daughter.  Compared to the last visit in February 2025 he continues to remain the same with symptoms burden [see below] and also 5 L nasal cannula oxygen use which is stable and unchanged.  However in the interim approximately a month ago got admitted for decompensated cor pulmonale.  Was diuresed and is back to baseline.  Syncope in the bathroom preceded the admission.  Dr. Toribio Sharps external records reviewed and is recommending right heart cath if he does not compensate well.  He was discharged and then he spent 2 weeks in a SNF rehab and went home on Apr 24, 2024.  Now  currently appears compensated.  His home physical therapy coming.  Occupational Therapy is going to start.  Independent of this f for the last few days he is beginning to have some nasal congestion increased shortness of breath and feeling generally fatigued  all the 5 L nasal cannula use is unchanged.  Daughter also had concerns about his DPI technique which I witnessed and was excellent.  I did tell him not to get too anxious and prepare for it but just to put it in the mouth and inhaler.  The mist did come out of the device and have requested some clarification from [employer].  Have also asked him to reach out to his [employer] nurse to address this.     OV 07/22/2024  Subjective:  Patient ID: Christian Woodard, male , DOB: December 08, 1945 , age 64 y.o. , MRN: [id number] , [address]: [address]  [address] [place] [id number] PCP Sheryle Carwin, MD Patient Care Team: Sheryle Carwin, MD as PCP - General (Internal Medicine) Stacia Diannah SQUIBB, MD as PCP - Cardiology (Cardiology) Shaaron Lamar HERO, MD as Consulting Physician (Gastroenterology) Darlean Ozell NOVAK, MD as Consulting Physician (Pulmonary Disease)  This Provider for this visit: Treatment Team:  Attending Provider: Geronimo Amel, MD    07/22/2024 -   Chief Complaint  Patient presents with   Medical Management of Chronic Issues   Interstitial Lung Disease    Breathing is unchanged since the last visit.     #IPF - diagnosis given Dec 2024 and reco pirfenidone  Rx snce Jan 2025 [low-dose protocol]  #Esbriet /Pirfenidone  requires intensive drug monitoring due to high concerns for Adverse effects of , including  Drug Induced Liver Injury, significant GI side effects that include but not limited to Diarrhea, Nausea, Vomiting,  and other system side effects that include Fatigue, headaches, weight loss and other side effects such as skin rash. These will be monitored with  blood work such as LFT initially once a month for 6 months and  then quarterly  #WHO-3 PAHx diagnosed Dec 17, 2023  - Normal filling pressures  - Moderate Pulmonary Hypertension - PA mean 41, PCWP 6, PRVR 8.80  - Lowish CI 2.15  - started TYVASO  - date 01/17/24   #Recurrent syncope - May 2025 - July 2025.  #Chronic hypoxemic respiratory failure due to the above - 8 L oxygen at rest as of 07/22/2024 following hospitalization in July 2025.  #Goals of care 07/22/2024  - Recommended no CPR and no intubation but full medical care including clinical trials   - Recommended filling out the MOST form at home  HPI Christian Woodard 78 y.o. -returns for follow-up.  Middle of July 2025 after this visit he had another admission for syncope that happened abruptly when he returned home from the primary care visit.  According to the daughter/granddaughter everything went well and then when he got home all of a sudden he had syncope it was not a hot day of anything.  In the hospital it appears he was hypotensive and also hypoglycemic.  At discharge they got rid of his Cozaar  which is being given for kidney reasons and glipizide .  They placed him on midodrine .  And so far blood pressure is better than baseline with a systolic greater than 100 and no further syncope.  However he is requiring 8 L of oxygen at rest and overall there is a decline in his respiratory status.  He is compliant with his Tyvaso  DPI and also his low-dose pirfenidone .  He is tolerating all this well.  He had some weight loss.  Goals of care - Family brought up the fact that CT scans and travel here and breathing test are all getting inconvenient.  I agreed with this and said we  could follow clinically - They have done a living will but apparently has not signed it but he did express to me that he is not interested in intubation or CPR.  I did indicate to him that it is a good idea to not have intubation or CPR but to consider all other clinical modalities of treatment at this point in time. -  Recommended they fill out a MOST form. - Discussed clinical trials as a care option and they are interested.     Of note he did have a CT angiogram chest in the hospital rule out pulmonary embolism.  I personally visualized this. The family is interested in getting blood work today.  [There was no BNP checked in the hospital] The wife and daughter are independent historians today.   SYMPTOM SCALE - ILD 11/07/2023 - IPF dx 01/15/2024  05/06/2024  07/22/2024   Current weight   Esbriet  and also Tyvaso . Low-dose as per protocol and also Tyvaso  DPI  Off losartan  On midodrine   O2 use ra 3L at res but is 92% RA at rest with the forehead probe 75% with a finger probe  8 L at rest  Shortness of Breath 0 -> 5 scale with 5 being worst (score 6 If unable to do)     At rest 2 2 1 3   Simple tasks - showers, clothes change, eating, shaving 5 5 4 5   Household (dishes, doing bed, laundry) 3 3 5 5   Shopping 4 5 5 5   Walking level at own pace 3 3 3 4   Walking up Stairs 4 5 5 5   Total (30-36) Dyspnea Score 21 23 23 27   How bad is your cough? 1 1 0 0  How bad is your fatigue 1 2 2 2   How bad is nausea 00 0 0 0  How bad is vomiting?  0 0 00 0  How bad is diarrhea? 00 0 0 3  How bad is anxiety? 0 0 00 1  How bad is depression 0 0 000 1  Any chronic pain - if so where and how bad x 0 0 0     PFT     Latest Ref Rng & Units 05/06/2024    9:07 AM 11/07/2023    1:54 PM 01/31/2021    1:28 PM 08/20/2017    8:36 AM  PFT Results  FVC-Pre L 2.41  2.89  2.87  2.92   FVC-Predicted Pre % 75  90  81  80   FVC-Post L   2.97  2.93   FVC-Predicted Post %   84  80   Pre FEV1/FVC % % 74  76  73  75   Post FEV1/FCV % %   75  76   FEV1-Pre L 1.77  2.19  2.10  2.18   FEV1-Predicted Pre % 79  97  83  82   FEV1-Post L   2.23  2.23   DLCO uncorrected ml/min/mmHg 4.97  8.79  10.30  11.95   DLCO UNC% % 24  42  47  45   DLCO corrected ml/min/mmHg 4.98  9.26  63.39  14.67   DLCO COR %Predicted % 24  45  290  55    DLVA Predicted % 33  54  337  78   TLC L   5.16  4.82   TLC % Predicted %   84  78   RV % Predicted %   89  86  LAB RESULTS last 96 hours No results found.       has a past medical history of Arthritis, Barrett's esophagus, Diabetes mellitus, GERD (gastroesophageal reflux disease), and Hypercholesteremia.   reports that he quit smoking about 25 years ago. His smoking use included cigarettes. He started smoking about 61 years ago. He has a 72 pack-year smoking history. He has never used smokeless tobacco.  Past Surgical History:  Procedure Laterality Date   BACK SURGERY  2000   CHOLECYSTECTOMY     COLONOSCOPY  09/15/2011   Dr. Shaaron: Suboptimal prep. Pancolonic diverticulosis. Tubular adenomas. Next colonoscopy 5 years.   COLONOSCOPY N/A 09/22/2016   Surgeon: Lamar CHRISTELLA Shaaron, MD; pancolonic diverticulosis, otherwise normal exam.  Recommended repeat in 5 years.   COLONOSCOPY WITH PROPOFOL  N/A 09/29/2021   Procedure: COLONOSCOPY WITH PROPOFOL ;  Surgeon: Shaaron Lamar CHRISTELLA, MD;  [facility]: [facility];  Service: Endoscopy;  Laterality: N/A;  9:00am   ESOPHAGOGASTRODUODENOSCOPY N/A 08/31/2017   Surgeon: Shaaron Lamar CHRISTELLA, MD;  Barrett's esophagus with low-grade dysplasia, small hiatal hernia with abnormal gastric mucosa biopsied (no H. pylori), normal examined duodenum.     ESOPHAGOGASTRODUODENOSCOPY  11/2018   [facility]; Barrett's esophagus, negative for dysplasia   ESOPHAGOGASTRODUODENOSCOPY  01/2020   [facility]; Barrett's esophagus, indefinite for focal low-grade dysplasia.   GIVENS CAPSULE STUDY N/A 10/26/2017   Surgeon: Shaaron Lamar CHRISTELLA, MD; localized clustered areas of white blunted appearing abnormal small bowel mucosa/villi of uncertain significance.  Follow-up CTE and small bowel enteroscopy at [facility] without significant findings.    GIVENS CAPSULE STUDY N/A 05/15/2023   Procedure: GIVENS CAPSULE STUDY;  Surgeon: Shaaron Lamar CHRISTELLA, MD;  Location: [facility];  Service: Endoscopy;   Laterality: N/A;  730am   HERNIA REPAIR     as a child   left cornea transplant Left    POLYPECTOMY  09/29/2021   Procedure: POLYPECTOMY;  Surgeon: Shaaron Lamar CHRISTELLA, MD;  Location: [facility];  Service: Endoscopy;;   Right cornea transplant     RIGHT HEART CATH N/A 12/17/2023   Procedure: RIGHT HEART CATH;  Surgeon: Rolan Ezra RAMAN, MD;  Location: [facility] INVASIVE CV LAB;  Service: Cardiovascular;  Laterality: N/A;   SMALL BOWEL ENTEROSCOPY  11/2017   [facility]; Barrett's esophagus focally positive for low-grade dysplasia, normal stomach, normal duodenum, normal jejunum.  Small bowel biopsies were benign.   TONSILLECTOMY      No Known Allergies  Immunization History  Administered Date(s) Administered   Fluad Quad(high Dose 65+) 10/07/2019, 11/22/2021   Fluad Trivalent(High Dose 65+) 08/20/2023   Influenza-Unspecified 10/29/2017, 08/20/2023   Moderna SARS-COV2 Booster Vaccination 01/10/2021   Moderna Sars-Covid-2 Vaccination 02/26/2020, 03/30/2020   Tdap 01/23/2017    Family History  Problem Relation Age of Onset   Heart disease Mother    Heart disease Father    Emphysema Father        smoked   Lung cancer Father        smoked   Colon cancer Neg Hx      Current Outpatient Medications:    acetaminophen  (TYLENOL ) 325 MG tablet, Take 2 tablets (650 mg total) by mouth every 6 (six) hours as needed for mild pain (pain score 1-3) (or Fever >/= 101)., Disp: , Rfl:    aspirin  EC 81 MG tablet, Take 1 tablet (81 mg total) by mouth daily with breakfast. Swallow whole., Disp: 30 tablet, Rfl: 11   atorvastatin  (LIPITOR) 10 MG tablet, Take 10 mg by mouth at bedtime., Disp: , Rfl:  famotidine  (PEPCID ) 20 MG tablet, Take 1 tablet (20 mg total) by mouth daily., Disp: 30 tablet, Rfl: 3   furosemide  (LASIX ) 20 MG tablet, Take 20 mg by mouth., Disp: , Rfl:    furosemide  (LASIX ) 40 MG tablet, Take 0.5 tablets (20 mg total) by mouth daily as needed for fluid., Disp: , Rfl:    gabapentin  (NEURONTIN ) 300  MG capsule, Take 1 capsule (300 mg total) by mouth 2 (two) times daily., Disp: , Rfl:    IYUZEH  0.005 % SOLN, Apply 1 drop to eye at bedtime., Disp: , Rfl:    metFORMIN  (GLUCOPHAGE ) 500 MG tablet, Take 500 mg by mouth 2 (two) times daily., Disp: , Rfl:    midodrine  (PROAMATINE ) 5 MG tablet, Take 1 tablet (5 mg total) by mouth 3 (three) times daily with meals., Disp: 90 tablet, Rfl: 0   pantoprazole  (PROTONIX ) 40 MG tablet, TAKE 1 TABLET BY MOUTH ONE TIME DAILY 30-60 MINUTES BEFORE BREAKFAST, Disp: 90 tablet, Rfl: 0   Pirfenidone  267 MG TABS, Take 2 tablets (534 mg total) by mouth in the morning, at noon, and at bedtime. **low dose as maintenance**, Disp: 180 tablet, Rfl: 2   potassium chloride  (MICRO-K ) 10 MEQ CR capsule, Take 1 capsule (10 mEq total) by mouth daily as needed (when you take lasix )., Disp: , Rfl:    TYVASO  DPI MAINTENANCE KIT 64 MCG POWD, Inhale one 64 mcg cartridge per treatment session four times daily, during waking hours. Some doses may require use of more than one cartridge per treatment session. Tear off one strip from the blister card, containing all four doses for the day. Push out 1 cartridge from the strip for the upcoming dose and load into inhaler. Store at room temperature. Do not re-refrigerate. **if refrigerated, cartridge and inhaler should be left out at room temperature for 10 minutes prior to use** Unopened blister cards may be stored at room temperature for up to 8 weeks. If stored in the refrigerator, do not use after the expiration date on the strip has passed. Opened blister strips must be used within 3 days., Disp: 112 each, Rfl: 5   cyanocobalamin  (VITAMIN B12) 1000 MCG tablet, Take 1,000 mcg by mouth daily., Disp: , Rfl:    Ferrous Gluconate  (IRON  27 PO), Take 1 tablet by mouth daily. (Patient not taking: Reported on 07/22/2024), Disp: , Rfl:    [Paused] LANTUS  SOLOSTAR 100 UNIT/ML Solostar Pen, Inject 20 Units into the skin 2 (two) times daily., Disp: 15 mL, Rfl:  11   [Paused] losartan  (COZAAR ) 25 MG tablet, Take 1 tablet (25 mg total) by mouth daily., Disp: 30 tablet, Rfl: 3   NOVOLOG  FLEXPEN 100 UNIT/ML FlexPen, Inject 0-12 Units into the skin See admin instructions. Short acting insulin  per sliding scale 0-10 ---:-- Insulin  injection 0-10 Units 0-10 Units Subcutaneous, 3 times daily with meals CBG 70 - 120: 0 unit CBG 121 - 150: 0 unit  CBG 151 - 200: 2 unit CBG 201 - 250: 4 units CBG 251 - 300: 6 units CBG 301 - 350: 8 units  CBG 351 - 400: 10 units  CBG > 400: 12 units (Patient not taking: Reported on 07/22/2024), Disp: 15 mL, Rfl: 11      Objective:   Vitals:   07/22/24 1059  BP: 114/68  Pulse: 89  SpO2: 91%  Weight: 158 lb (71.7 kg)  Height: 5' 6 (1.676 m)    Estimated body mass index is 25.5 kg/m as calculated from the following:  Height as of this encounter: 5' 6 (1.676 m).   Weight as of this encounter: 158 lb (71.7 kg).  @WEIGHTCHANGE @  [employer]   07/22/24 1059  Weight: 158 lb (71.7 kg)     Physical Exam   General: No distress. Looks chronic unwell O2 at rest: yes 8L Cane present: no Sitting in wheel chair: yes Frail: yes Obese: no Neuro: Alert and Oriented x 3. GCS 15. Speech normal Psych: Pleasant Resp:  Barrel Chest - no.  Wheeze - no, Crackles - yes, No overt respiratory distress CVS: Normal heart sounds. Murmurs - no Ext: Stigmata of Connective Tissue Disease - no HEENT: Normal upper airway. PEERL +. No post nasal drip        Assessment/     Assessment & Plan Chronic respiratory failure with hypoxia (HCC)  IPF (idiopathic pulmonary fibrosis) (HCC)  Pulmonary fibrosis (HCC)  WHO group 3 pulmonary arterial hypertension (HCC)  Goals of care, counseling/discussion    PLAN Patient Instructions     ICD-10-CM   1. Pulmonary fibrosis (HCC)  J84.10 B Nat Peptide    Basic Metabolic Panel (BMET)    Hepatic function panel    CBC w/Diff    CBC w/Diff    B Nat Peptide    Basic Metabolic Panel  (BMET)    Hepatic function panel    2. Chronic respiratory failure with hypoxia (HCC)  J96.11 B Nat Peptide    Basic Metabolic Panel (BMET)    Hepatic function panel    CBC w/Diff    CBC w/Diff    B Nat Peptide    Basic Metabolic Panel (BMET)    Hepatic function panel    3. WHO group 3 pulmonary arterial hypertension (HCC)  I27.23 B Nat Peptide    Basic Metabolic Panel (BMET)    Hepatic function panel    CBC w/Diff    CBC w/Diff    B Nat Peptide    Basic Metabolic Panel (BMET)    Hepatic function panel        Chronic hypoxemic respiratory failure  -This is because of a combination of pulmonary fibrosis IPF and also pulmonary hypertension diagnosed January 2025 - Clinically more stable after decompensation because of cor pulmonale in mid May 2025 and July 2025.   - But overall the disease appears to be progressive and now on 8L at result   Plan - Continue oxygen to keep pulse ox goal greater than 88%  -- Use  8 L of oxygen - Glad you are going through home physical therapy - Check blood BNP today  Syncope  - likely due to [facility] and Cozzar and tyvaso  all mixing up - resolved afte July 2025 admit and stopping cozaar  and starting midodrie  Plan  - keep pulse ox > 88%  - continue mididrine  - avoid Anti-BP drugs  ILD/IPF -IPF diagnosis given December 2024 High risk prescription Encounter for therapeutic monitoring  -Glad you are tolerating low-dose pirfenidone  protocol well since January 2025 - Liver function test May 2025 is normal   PLAN -Continue Esbriet  2 pills 3 times daily with food and please to apply sunscreen when you go out -Check liver function test 07/22/2024 - hold off on CT an dPFT monitoring due to inconvenience  - appreciate interetst in clinical trials   WHO group 3 pulmonary hypertension =-diagnosed January 2025  -Tolerating Tyvaso  DPI at full dose   Plan  - Continue inhaled Tyvaso  DPI per protocol at full dose = chjec BNP  07/22/2024 -  consider PHOCUS study in late 2025 /early 2026  GOALS of care  Plan  - recommend DNR, DNI - recommend fillling out MOST FORM at home in [place]   Followup -8-12 weeks 30-minute visit with Dr. Geronimo BOSCH    Return in about 10 weeks (around 09/30/2024) for Pulmonary Hypertension, with Dr Geronimo, ILD.    SIGNATURE    Dr. Dorethia Geronimo, M.D., F.C.C.P,  Pulmonary and Critical Care Medicine Staff Physician, [facility] Center Director - Interstitial Lung Disease  Program  Pulmonary Fibrosis [facility] at [facility] [place], [place], [address]  Pager: [phone], If no answer or between  15:00h - 7:00h: call [phone] Telephone: [phone]  5:42 PM 07/22/2024  HIGh Complexity  OFFICE   2021 E/M guidelines, first released in 2021, with minor revisions added in 2023. Must meet the requirements for 2 out of 3 dimensions to qualify.    Number and complexity of problems addressed Amount and/or complexity of data reviewed Risk of complications and/or morbidity  Severe exacerbation of chronic illness  Acute or chronic illnesses that may pose a threat to life or bodily function, e.g., multiple trauma, acute MI, pulmonary embolus, severe respiratory distress, progressive rheumatoid arthritis, psychiatric illness with potential threat to self or others, peritonitis, acute renal failure, abrupt change in neurological status Must meet the requirements for 2 of 3 of the categories)  Category 1: Tests and documents, historian  Any combination of 3 of the following:  Assessment requiring an independent historian  Review of prior external note(s) from each unique source  Review of results of each unique test  Ordering of each unique test    Category 2: Interpretation of tests    Independent interpretation of a test performed by another physician/other qualified health care professional (not separately  reported)  Category 3: Discuss management/tests  Discussion of management or test interpretation with external physician/other qualified health care professional/appropriate source (not separately reported)  HIGH risk of morbidity from additional diagnostic testing or treatment Examples only:  Drug therapy requiring intensive monitoring for toxicity  Decision for elective major surgery with identified pateint or procedure risk factors  Decision regarding hospitalization or escalation of level of care  Decision for DNR or to de-escalate care   Parenteral controlled  substances

## 2024-07-23 NOTE — Telephone Encounter (Signed)
 I called and spoke with the pt's spouse, Avelina, [place] per DPR  I spoke with her regarding her results below She states that pt still not est with cards  She plans to call them today and try and make the appt  He is currently taking his lasix  20 mg daily  She says he takes midodrine  tid to keep BP up  MR- please advise, thanks!

## 2024-07-25 NOTE — Telephone Encounter (Signed)
 I called and spoke to pt's wife, Christian Woodard. [facility]) Christian Woodard informed of Dr Reeves note and verbalized understanding. NFN

## 2024-07-25 NOTE — Telephone Encounter (Signed)
 Okay given the fact the patient is already on Lasix  I do not want to increase this further because of the blood pressure issues.  They need to establish with cardiology

## 2024-07-29 ENCOUNTER — Other Ambulatory Visit: Payer: Self-pay

## 2024-07-29 ENCOUNTER — Other Ambulatory Visit: Payer: Self-pay | Admitting: Internal Medicine

## 2024-07-29 DIAGNOSIS — J84112 Idiopathic pulmonary fibrosis: Secondary | ICD-10-CM

## 2024-07-29 MED ORDER — PIRFENIDONE 267 MG PO TABS
534.0000 mg | ORAL_TABLET | Freq: Three times a day (TID) | ORAL | 2 refills | Status: DC
  Filled 2024-07-29: qty 180, 30d supply, fill #0

## 2024-07-29 NOTE — Progress Notes (Signed)
 Specialty Pharmacy Refill Coordination Note  Christian Woodard is a 78 y.o. male contacted today regarding refills of specialty medication(s) Pirfenidone    Patient requested Delivery   Delivery date: 08/07/24   Verified address: [address] [place]   Medication will be filled on 08/06/24.

## 2024-07-29 NOTE — Telephone Encounter (Signed)
 Refill sent for ESBRIET  to [facility]: [phone]   Dose: 534mg  TID **low dose as maintenance**  Last OV: 07/22/24 Provider: Dr. Geronimo Pertinent labs: LFTs wnl 07/22/24  Next OV: due 8-12 weeks (not yet scheduled) around 09/30/24  Aleck Puls, PharmD, BCPS Clinical Pharmacist  [facility]

## 2024-08-01 DIAGNOSIS — N1831 Chronic kidney disease, stage 3a: Secondary | ICD-10-CM | POA: Diagnosis not present

## 2024-08-01 DIAGNOSIS — I5033 Acute on chronic diastolic (congestive) heart failure: Secondary | ICD-10-CM | POA: Diagnosis not present

## 2024-08-01 DIAGNOSIS — J8410 Pulmonary fibrosis, unspecified: Secondary | ICD-10-CM | POA: Diagnosis not present

## 2024-08-01 DIAGNOSIS — E1122 Type 2 diabetes mellitus with diabetic chronic kidney disease: Secondary | ICD-10-CM | POA: Diagnosis not present

## 2024-08-01 DIAGNOSIS — I272 Pulmonary hypertension, unspecified: Secondary | ICD-10-CM | POA: Diagnosis not present

## 2024-08-01 DIAGNOSIS — G40909 Epilepsy, unspecified, not intractable, without status epilepticus: Secondary | ICD-10-CM | POA: Diagnosis not present

## 2024-08-01 DIAGNOSIS — L03116 Cellulitis of left lower limb: Secondary | ICD-10-CM | POA: Diagnosis not present

## 2024-08-01 DIAGNOSIS — J9621 Acute and chronic respiratory failure with hypoxia: Secondary | ICD-10-CM | POA: Diagnosis not present

## 2024-08-01 DIAGNOSIS — G35 Multiple sclerosis: Secondary | ICD-10-CM | POA: Diagnosis not present

## 2024-08-06 ENCOUNTER — Other Ambulatory Visit: Payer: Self-pay

## 2024-08-07 DIAGNOSIS — J9621 Acute and chronic respiratory failure with hypoxia: Secondary | ICD-10-CM | POA: Diagnosis not present

## 2024-08-07 DIAGNOSIS — E1122 Type 2 diabetes mellitus with diabetic chronic kidney disease: Secondary | ICD-10-CM | POA: Diagnosis not present

## 2024-08-07 DIAGNOSIS — G40909 Epilepsy, unspecified, not intractable, without status epilepticus: Secondary | ICD-10-CM | POA: Diagnosis not present

## 2024-08-07 DIAGNOSIS — L03116 Cellulitis of left lower limb: Secondary | ICD-10-CM | POA: Diagnosis not present

## 2024-08-07 DIAGNOSIS — I272 Pulmonary hypertension, unspecified: Secondary | ICD-10-CM | POA: Diagnosis not present

## 2024-08-07 DIAGNOSIS — J8410 Pulmonary fibrosis, unspecified: Secondary | ICD-10-CM | POA: Diagnosis not present

## 2024-08-07 DIAGNOSIS — I5033 Acute on chronic diastolic (congestive) heart failure: Secondary | ICD-10-CM | POA: Diagnosis not present

## 2024-08-07 DIAGNOSIS — N1831 Chronic kidney disease, stage 3a: Secondary | ICD-10-CM | POA: Diagnosis not present

## 2024-08-07 DIAGNOSIS — G35 Multiple sclerosis: Secondary | ICD-10-CM | POA: Diagnosis not present

## 2024-08-14 DIAGNOSIS — I469 Cardiac arrest, cause unspecified: Secondary | ICD-10-CM | POA: Diagnosis not present

## 2024-08-14 DIAGNOSIS — R0602 Shortness of breath: Secondary | ICD-10-CM | POA: Diagnosis not present

## 2024-08-14 DIAGNOSIS — R0681 Apnea, not elsewhere classified: Secondary | ICD-10-CM | POA: Diagnosis not present

## 2024-08-14 DIAGNOSIS — I499 Cardiac arrhythmia, unspecified: Secondary | ICD-10-CM | POA: Diagnosis not present

## 2024-08-14 DIAGNOSIS — I4510 Unspecified right bundle-branch block: Secondary | ICD-10-CM | POA: Diagnosis not present

## 2024-08-19 ENCOUNTER — Telehealth: Payer: Self-pay

## 2024-08-19 NOTE — Telephone Encounter (Signed)
 Spoke to [employer]. Suddenly on AM on Sep 04, 2024 he died . 911 was calld. They started CPR. Did 30 min. HAd weakness and back pain  Exprssed condolences   SIGNATURE    Dr. Dorethia Cave, M.D., F.C.C.P,  Pulmonary and Critical Care Medicine Staff Physician, [facility] Center Director - Interstitial Lung Disease  Program  Pulmonary Fibrosis [facility] at [facility] [place], [place], [address]   Pager: [phone], If no answer  -> Check AMION or Try [phone] Telephone (clinical office): [phone] Telephone (research): [phone]  6:46 PM 08/19/2024

## 2024-08-19 NOTE — Telephone Encounter (Signed)
 Copied from CRM #[id number]. Topic: General - Other >> Aug 19, 2024 11:35 AM Rilla B wrote: Reason for CRM: Grand-daughter calling to state he passed away on 09-13-24. She states she always comes to his appointments with him and wanted Dr Geronimo and his team to know.   I called and wished our condolences to the patients family.  Pts wife stated she was very thankful for Dr. Geronimo taking care of her husband.  FYI Dr. Geronimo.

## 2024-08-27 DEATH — deceased

## 2024-08-29 ENCOUNTER — Other Ambulatory Visit: Payer: Self-pay

## 2024-08-29 NOTE — Progress Notes (Signed)
Disenrolling from program

## 2024-09-04 ENCOUNTER — Ambulatory Visit: Admitting: Student
